# Patient Record
Sex: Male | Born: 1940
Health system: Southern US, Community
[De-identification: ages and names within clinical notes are randomized; demographics above are authoritative.]

## PROBLEM LIST (undated history)

## (undated) DIAGNOSIS — I10 Essential (primary) hypertension: Secondary | ICD-10-CM

## (undated) DIAGNOSIS — I619 Nontraumatic intracerebral hemorrhage, unspecified: Secondary | ICD-10-CM

## (undated) DIAGNOSIS — M549 Dorsalgia, unspecified: Secondary | ICD-10-CM

## (undated) DIAGNOSIS — I639 Cerebral infarction, unspecified: Secondary | ICD-10-CM

## (undated) DIAGNOSIS — S069XAA Unspecified intracranial injury with loss of consciousness status unknown, initial encounter: Secondary | ICD-10-CM

## (undated) DIAGNOSIS — G629 Polyneuropathy, unspecified: Secondary | ICD-10-CM

## (undated) DIAGNOSIS — I2699 Other pulmonary embolism without acute cor pulmonale: Secondary | ICD-10-CM

## (undated) DIAGNOSIS — F419 Anxiety disorder, unspecified: Secondary | ICD-10-CM

## (undated) DIAGNOSIS — G4730 Sleep apnea, unspecified: Secondary | ICD-10-CM

## (undated) DIAGNOSIS — F329 Major depressive disorder, single episode, unspecified: Secondary | ICD-10-CM

## (undated) DIAGNOSIS — F32A Depression, unspecified: Secondary | ICD-10-CM

## (undated) DIAGNOSIS — S069X9A Unspecified intracranial injury with loss of consciousness of unspecified duration, initial encounter: Secondary | ICD-10-CM

## (undated) DIAGNOSIS — E7800 Pure hypercholesterolemia, unspecified: Secondary | ICD-10-CM

## (undated) HISTORY — PX: LEG SURGERY: SHX1003

## (undated) HISTORY — PX: HERNIA REPAIR: SHX51

## (undated) HISTORY — DX: Depression, unspecified: F32.A

## (undated) HISTORY — PX: TOTAL HIP ARTHROPLASTY: SHX124

## (undated) HISTORY — DX: Nontraumatic intracerebral hemorrhage, unspecified: I61.9

## (undated) HISTORY — DX: Anxiety disorder, unspecified: F41.9

## (undated) HISTORY — DX: Dorsalgia, unspecified: M54.9

## (undated) HISTORY — PX: JOINT REPLACEMENT: SHX530

## (undated) HISTORY — DX: Polyneuropathy, unspecified: G62.9

## (undated) HISTORY — DX: Major depressive disorder, single episode, unspecified: F32.9

---

## 1998-09-17 ENCOUNTER — Encounter: Payer: Self-pay | Admitting: Emergency Medicine

## 1998-09-17 ENCOUNTER — Emergency Department (HOSPITAL_COMMUNITY): Admission: EM | Admit: 1998-09-17 | Discharge: 1998-09-17 | Payer: Self-pay | Admitting: Emergency Medicine

## 1998-09-19 ENCOUNTER — Encounter: Payer: Self-pay | Admitting: Emergency Medicine

## 1998-09-19 ENCOUNTER — Emergency Department (HOSPITAL_COMMUNITY): Admission: EM | Admit: 1998-09-19 | Discharge: 1998-09-19 | Payer: Self-pay | Admitting: Emergency Medicine

## 1998-09-21 ENCOUNTER — Encounter: Payer: Self-pay | Admitting: General Surgery

## 1998-09-21 ENCOUNTER — Emergency Department (HOSPITAL_COMMUNITY): Admission: EM | Admit: 1998-09-21 | Discharge: 1998-09-21 | Payer: Self-pay

## 1998-10-29 ENCOUNTER — Ambulatory Visit (HOSPITAL_COMMUNITY): Admission: RE | Admit: 1998-10-29 | Discharge: 1998-10-29 | Payer: Self-pay | Admitting: *Deleted

## 1998-10-29 ENCOUNTER — Encounter (INDEPENDENT_AMBULATORY_CARE_PROVIDER_SITE_OTHER): Payer: Self-pay | Admitting: Specialist

## 1998-11-09 ENCOUNTER — Encounter: Payer: Self-pay | Admitting: Internal Medicine

## 1998-11-09 ENCOUNTER — Inpatient Hospital Stay (HOSPITAL_COMMUNITY): Admission: EM | Admit: 1998-11-09 | Discharge: 1998-11-13 | Payer: Self-pay | Admitting: Emergency Medicine

## 1998-11-09 ENCOUNTER — Encounter: Payer: Self-pay | Admitting: Emergency Medicine

## 1998-11-09 ENCOUNTER — Encounter (HOSPITAL_BASED_OUTPATIENT_CLINIC_OR_DEPARTMENT_OTHER): Payer: Self-pay | Admitting: Internal Medicine

## 1998-11-15 ENCOUNTER — Encounter: Admission: RE | Admit: 1998-11-15 | Discharge: 1999-02-13 | Payer: Self-pay | Admitting: Internal Medicine

## 2000-04-17 ENCOUNTER — Encounter: Payer: Self-pay | Admitting: General Surgery

## 2000-04-20 ENCOUNTER — Ambulatory Visit (HOSPITAL_COMMUNITY): Admission: RE | Admit: 2000-04-20 | Discharge: 2000-04-20 | Payer: Self-pay | Admitting: General Surgery

## 2000-05-01 ENCOUNTER — Ambulatory Visit (HOSPITAL_COMMUNITY): Admission: RE | Admit: 2000-05-01 | Discharge: 2000-05-01 | Payer: Self-pay | Admitting: *Deleted

## 2000-05-01 ENCOUNTER — Encounter (INDEPENDENT_AMBULATORY_CARE_PROVIDER_SITE_OTHER): Payer: Self-pay

## 2001-12-14 ENCOUNTER — Encounter: Payer: Self-pay | Admitting: Emergency Medicine

## 2001-12-14 ENCOUNTER — Inpatient Hospital Stay (HOSPITAL_COMMUNITY): Admission: AC | Admit: 2001-12-14 | Discharge: 2002-01-06 | Payer: Self-pay

## 2001-12-15 ENCOUNTER — Encounter: Payer: Self-pay | Admitting: General Surgery

## 2001-12-16 ENCOUNTER — Encounter: Payer: Self-pay | Admitting: General Surgery

## 2001-12-17 ENCOUNTER — Encounter: Payer: Self-pay | Admitting: Critical Care Medicine

## 2001-12-17 ENCOUNTER — Encounter: Payer: Self-pay | Admitting: Pulmonary Disease

## 2001-12-17 ENCOUNTER — Encounter: Payer: Self-pay | Admitting: General Surgery

## 2001-12-18 ENCOUNTER — Encounter: Payer: Self-pay | Admitting: General Surgery

## 2001-12-19 ENCOUNTER — Encounter: Payer: Self-pay | Admitting: General Surgery

## 2001-12-19 ENCOUNTER — Encounter: Payer: Self-pay | Admitting: Neurosurgery

## 2001-12-20 ENCOUNTER — Encounter: Payer: Self-pay | Admitting: General Surgery

## 2001-12-20 ENCOUNTER — Encounter: Payer: Self-pay | Admitting: Internal Medicine

## 2001-12-21 ENCOUNTER — Encounter: Payer: Self-pay | Admitting: General Surgery

## 2001-12-22 ENCOUNTER — Encounter: Payer: Self-pay | Admitting: General Surgery

## 2001-12-23 ENCOUNTER — Encounter: Payer: Self-pay | Admitting: General Surgery

## 2001-12-23 ENCOUNTER — Encounter: Payer: Self-pay | Admitting: Neurosurgery

## 2001-12-24 ENCOUNTER — Encounter: Payer: Self-pay | Admitting: Critical Care Medicine

## 2001-12-24 ENCOUNTER — Encounter: Payer: Self-pay | Admitting: Surgery

## 2001-12-24 ENCOUNTER — Encounter: Payer: Self-pay | Admitting: General Surgery

## 2001-12-25 ENCOUNTER — Encounter: Payer: Self-pay | Admitting: Critical Care Medicine

## 2001-12-28 ENCOUNTER — Encounter: Payer: Self-pay | Admitting: General Surgery

## 2001-12-29 ENCOUNTER — Encounter: Payer: Self-pay | Admitting: General Surgery

## 2001-12-30 ENCOUNTER — Encounter: Payer: Self-pay | Admitting: General Surgery

## 2001-12-31 ENCOUNTER — Encounter: Payer: Self-pay | Admitting: General Surgery

## 2002-01-01 ENCOUNTER — Encounter: Payer: Self-pay | Admitting: General Surgery

## 2002-01-02 ENCOUNTER — Encounter: Payer: Self-pay | Admitting: General Surgery

## 2002-01-05 ENCOUNTER — Encounter: Payer: Self-pay | Admitting: General Surgery

## 2002-01-06 ENCOUNTER — Inpatient Hospital Stay (HOSPITAL_COMMUNITY)
Admission: RE | Admit: 2002-01-06 | Discharge: 2002-01-27 | Payer: Self-pay | Admitting: Physical Medicine & Rehabilitation

## 2002-01-14 ENCOUNTER — Encounter: Payer: Self-pay | Admitting: Physical Medicine & Rehabilitation

## 2002-02-01 ENCOUNTER — Encounter
Admission: RE | Admit: 2002-02-01 | Discharge: 2002-03-08 | Payer: Self-pay | Admitting: Physical Medicine & Rehabilitation

## 2003-04-13 ENCOUNTER — Encounter: Admission: RE | Admit: 2003-04-13 | Discharge: 2003-04-13 | Payer: Self-pay | Admitting: Orthopedic Surgery

## 2003-09-12 ENCOUNTER — Inpatient Hospital Stay (HOSPITAL_COMMUNITY): Admission: RE | Admit: 2003-09-12 | Discharge: 2003-09-30 | Payer: Self-pay | Admitting: *Deleted

## 2003-11-07 ENCOUNTER — Inpatient Hospital Stay (HOSPITAL_COMMUNITY): Admission: EM | Admit: 2003-11-07 | Discharge: 2003-11-14 | Payer: Self-pay | Admitting: Emergency Medicine

## 2003-11-14 ENCOUNTER — Inpatient Hospital Stay
Admission: RE | Admit: 2003-11-14 | Discharge: 2003-11-28 | Payer: Self-pay | Admitting: Physical Medicine & Rehabilitation

## 2003-11-14 ENCOUNTER — Ambulatory Visit: Payer: Self-pay | Admitting: Physical Medicine & Rehabilitation

## 2003-11-17 ENCOUNTER — Encounter (HOSPITAL_COMMUNITY): Admission: RE | Admit: 2003-11-17 | Discharge: 2003-11-18 | Payer: Self-pay | Admitting: Emergency Medicine

## 2004-03-19 ENCOUNTER — Inpatient Hospital Stay (HOSPITAL_COMMUNITY): Admission: RE | Admit: 2004-03-19 | Discharge: 2004-03-30 | Payer: Self-pay | Admitting: Orthopedic Surgery

## 2004-03-29 ENCOUNTER — Ambulatory Visit: Payer: Self-pay | Admitting: Physical Medicine & Rehabilitation

## 2004-03-30 ENCOUNTER — Encounter (HOSPITAL_COMMUNITY)
Admission: RE | Admit: 2004-03-30 | Discharge: 2004-04-10 | Payer: Self-pay | Admitting: Physical Medicine & Rehabilitation

## 2004-03-30 ENCOUNTER — Inpatient Hospital Stay
Admission: RE | Admit: 2004-03-30 | Discharge: 2004-04-10 | Payer: Self-pay | Admitting: Physical Medicine & Rehabilitation

## 2005-03-15 IMAGING — CR DG ABDOMEN 1V
1 series · 1 of 1 positions shown · non-contrast
Comparison: 09/25/03

CLINICAL DATA: Abdominal distention. 
 PORTABLE ABDOMEN ? 09/28/03 ([DATE] HOURS)

[view not recorded]
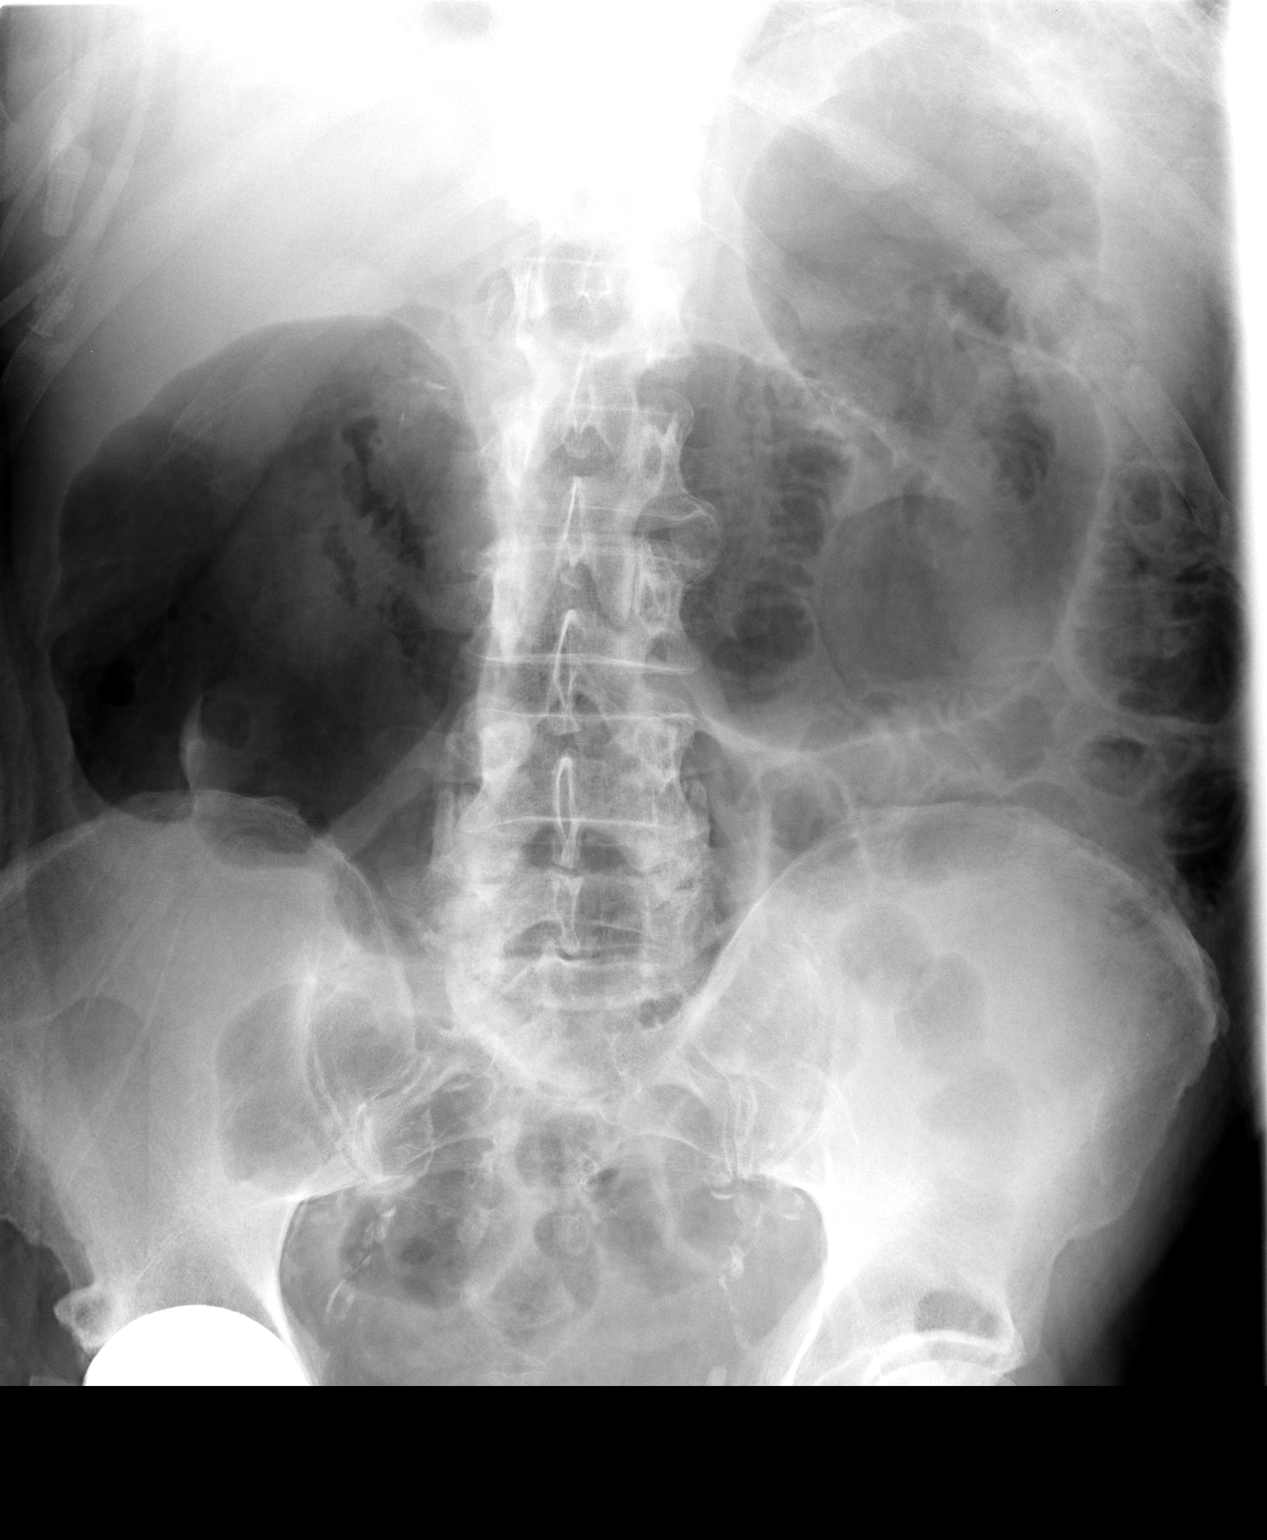

[1 of 1 positions shown; findings below may reference images not displayed]

FINDINGS: Colonic distention has improved.  There is persistent distention at the hepatic flexure.  Small bowel distention is now better visualized.  Degenerative changes in the spine are again noted.  Vascular calcifications are seen.  The soft tissues are within normal limits.  
 IMPRESSION 
 Improving colonic ileus.

## 2005-03-16 IMAGING — CR DG ABDOMEN 1V
1 series · 1 of 1 positions shown · non-contrast
Comparison: 09/28/03 at [DATE] hours.

CLINICAL DATA: Right hip osteoarthritis. 
 FLAT ABDOMEN

[view not recorded]
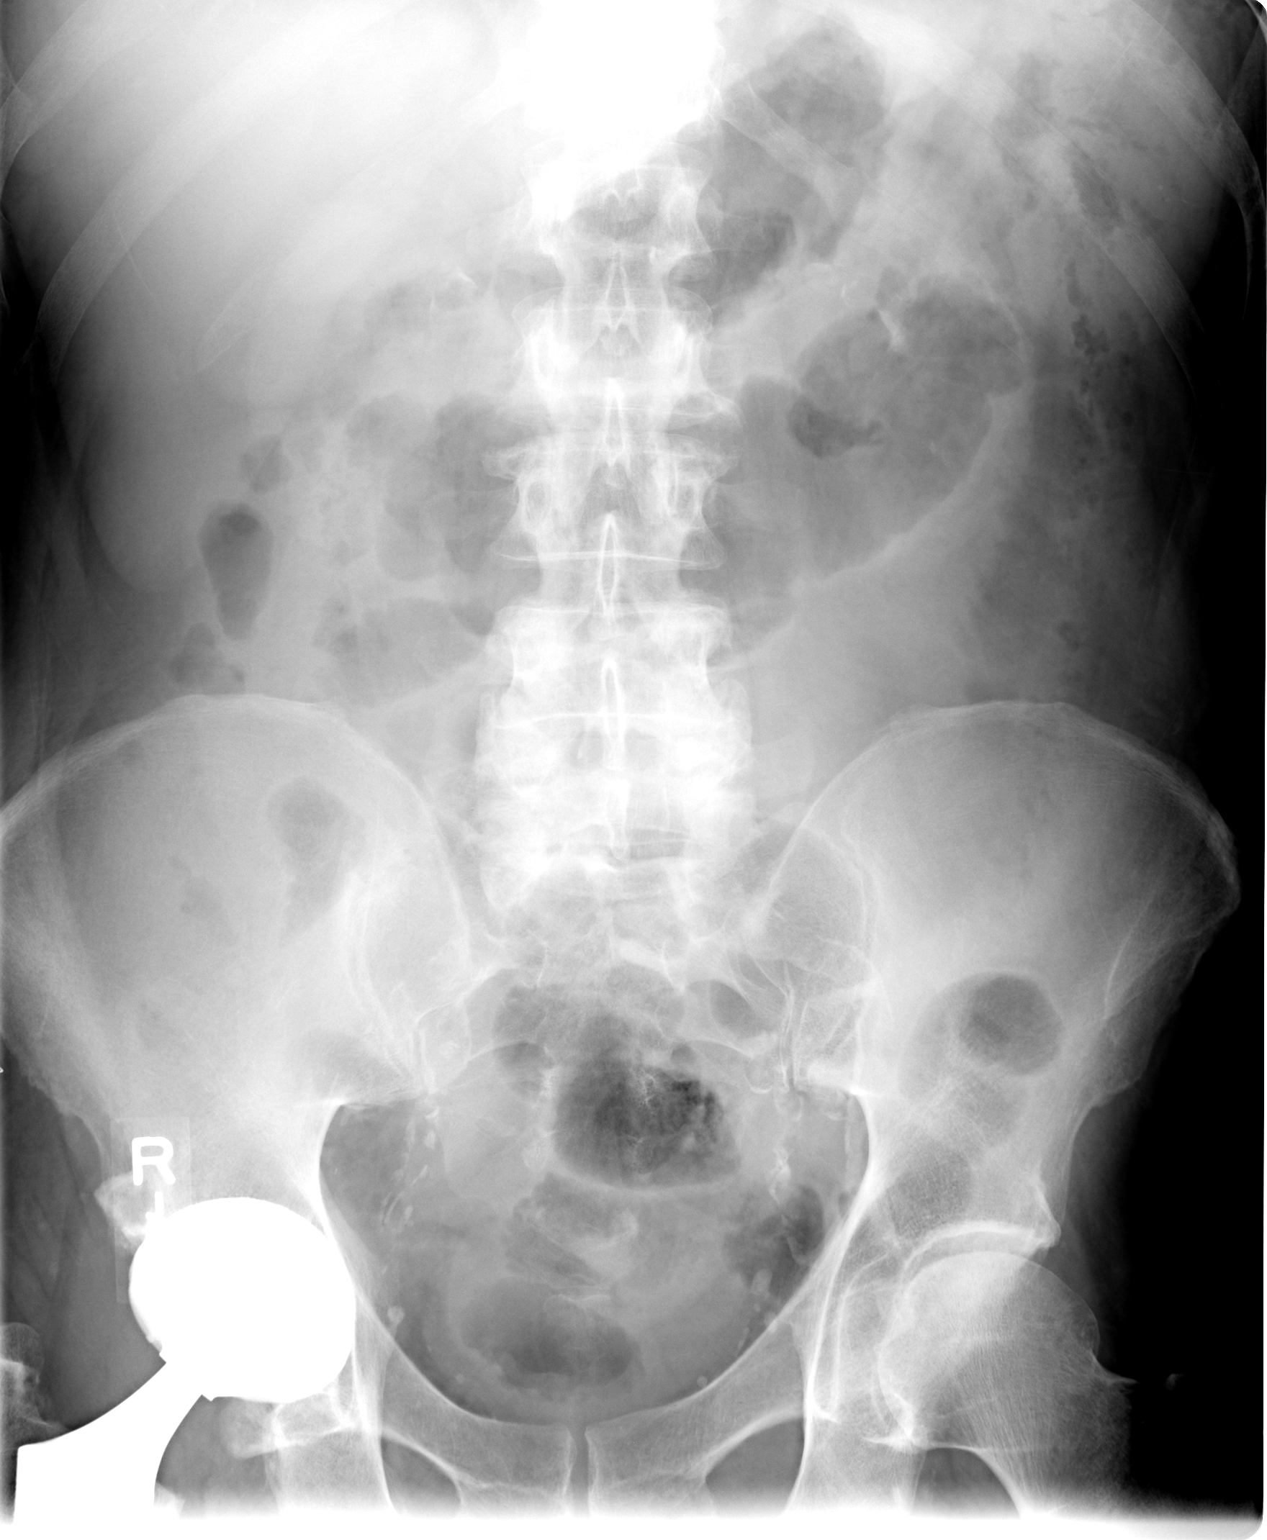

[1 of 1 positions shown; findings below may reference images not displayed]

FINDINGS: There has been further improvement in colonic distention.  Now a nondilated colon and mildly prominent loops of small bowel are present.  Gas is seen in the rectosigmoid. 
 IMPRESSION
 Nearly resolved ileus.

## 2007-01-30 ENCOUNTER — Emergency Department (HOSPITAL_COMMUNITY): Admission: EM | Admit: 2007-01-30 | Discharge: 2007-01-30 | Payer: Self-pay | Admitting: Emergency Medicine

## 2007-11-12 ENCOUNTER — Ambulatory Visit (HOSPITAL_COMMUNITY): Admission: RE | Admit: 2007-11-12 | Discharge: 2007-11-12 | Payer: Self-pay | Admitting: *Deleted

## 2007-11-12 ENCOUNTER — Encounter (INDEPENDENT_AMBULATORY_CARE_PROVIDER_SITE_OTHER): Payer: Self-pay | Admitting: *Deleted

## 2008-05-26 ENCOUNTER — Encounter: Admission: RE | Admit: 2008-05-26 | Discharge: 2008-05-26 | Payer: Self-pay | Admitting: Internal Medicine

## 2008-07-08 ENCOUNTER — Encounter: Admission: RE | Admit: 2008-07-08 | Discharge: 2008-07-08 | Payer: Self-pay | Admitting: Neurosurgery

## 2008-10-13 ENCOUNTER — Encounter: Admission: RE | Admit: 2008-10-13 | Discharge: 2008-10-13 | Payer: Self-pay | Admitting: Neurosurgery

## 2009-07-14 ENCOUNTER — Ambulatory Visit: Payer: Self-pay | Admitting: Pulmonary Disease

## 2009-07-14 ENCOUNTER — Inpatient Hospital Stay (HOSPITAL_COMMUNITY): Admission: EM | Admit: 2009-07-14 | Discharge: 2009-07-20 | Payer: Self-pay | Admitting: Emergency Medicine

## 2009-12-25 ENCOUNTER — Encounter: Admission: RE | Admit: 2009-12-25 | Discharge: 2009-12-25 | Payer: Self-pay | Admitting: Internal Medicine

## 2010-06-11 LAB — DIFFERENTIAL
Basophils Absolute: 0 10*3/uL (ref 0.0–0.1)
Basophils Relative: 0 % (ref 0–1)
Eosinophils Absolute: 0 10*3/uL (ref 0.0–0.7)
Eosinophils Relative: 0 % (ref 0–5)
Lymphocytes Relative: 10 % — ABNORMAL LOW (ref 12–46)
Lymphocytes Relative: 12 % (ref 12–46)
Monocytes Absolute: 0.9 10*3/uL (ref 0.1–1.0)
Neutro Abs: 7.9 10*3/uL — ABNORMAL HIGH (ref 1.7–7.7)

## 2010-06-11 LAB — GLUCOSE, CAPILLARY
Glucose-Capillary: 101 mg/dL — ABNORMAL HIGH (ref 70–99)
Glucose-Capillary: 107 mg/dL — ABNORMAL HIGH (ref 70–99)
Glucose-Capillary: 110 mg/dL — ABNORMAL HIGH (ref 70–99)
Glucose-Capillary: 114 mg/dL — ABNORMAL HIGH (ref 70–99)
Glucose-Capillary: 116 mg/dL — ABNORMAL HIGH (ref 70–99)
Glucose-Capillary: 120 mg/dL — ABNORMAL HIGH (ref 70–99)
Glucose-Capillary: 125 mg/dL — ABNORMAL HIGH (ref 70–99)
Glucose-Capillary: 141 mg/dL — ABNORMAL HIGH (ref 70–99)
Glucose-Capillary: 147 mg/dL — ABNORMAL HIGH (ref 70–99)
Glucose-Capillary: 152 mg/dL — ABNORMAL HIGH (ref 70–99)
Glucose-Capillary: 155 mg/dL — ABNORMAL HIGH (ref 70–99)
Glucose-Capillary: 75 mg/dL (ref 70–99)
Glucose-Capillary: 75 mg/dL (ref 70–99)
Glucose-Capillary: 81 mg/dL (ref 70–99)
Glucose-Capillary: 89 mg/dL (ref 70–99)
Glucose-Capillary: 90 mg/dL (ref 70–99)
Glucose-Capillary: 95 mg/dL (ref 70–99)
Glucose-Capillary: 96 mg/dL (ref 70–99)
Glucose-Capillary: 97 mg/dL (ref 70–99)

## 2010-06-11 LAB — URINALYSIS, ROUTINE W REFLEX MICROSCOPIC
Bilirubin Urine: NEGATIVE
Bilirubin Urine: NEGATIVE
Hgb urine dipstick: NEGATIVE
Ketones, ur: NEGATIVE mg/dL
Ketones, ur: NEGATIVE mg/dL
Nitrite: NEGATIVE
Nitrite: NEGATIVE
Protein, ur: NEGATIVE mg/dL
Protein, ur: NEGATIVE mg/dL
Specific Gravity, Urine: 1.019 (ref 1.005–1.030)
Urobilinogen, UA: 0.2 mg/dL (ref 0.0–1.0)
Urobilinogen, UA: 0.2 mg/dL (ref 0.0–1.0)
pH: 5 (ref 5.0–8.0)
pH: 5 (ref 5.0–8.0)

## 2010-06-11 LAB — CBC
HCT: 32.3 % — ABNORMAL LOW (ref 39.0–52.0)
HCT: 32.6 % — ABNORMAL LOW (ref 39.0–52.0)
HCT: 32.7 % — ABNORMAL LOW (ref 39.0–52.0)
HCT: 33.4 % — ABNORMAL LOW (ref 39.0–52.0)
Hemoglobin: 11.2 g/dL — ABNORMAL LOW (ref 13.0–17.0)
Hemoglobin: 11.8 g/dL — ABNORMAL LOW (ref 13.0–17.0)
MCHC: 34.8 g/dL (ref 30.0–36.0)
MCHC: 35.3 g/dL (ref 30.0–36.0)
MCHC: 35.4 g/dL (ref 30.0–36.0)
MCHC: 35.4 g/dL (ref 30.0–36.0)
MCV: 93.3 fL (ref 78.0–100.0)
MCV: 93.6 fL (ref 78.0–100.0)
MCV: 94 fL (ref 78.0–100.0)
Platelets: 154 10*3/uL (ref 150–400)
Platelets: 180 10*3/uL (ref 150–400)
Platelets: 210 10*3/uL (ref 150–400)
RBC: 3.43 MIL/uL — ABNORMAL LOW (ref 4.22–5.81)
RBC: 3.47 MIL/uL — ABNORMAL LOW (ref 4.22–5.81)
RBC: 3.51 MIL/uL — ABNORMAL LOW (ref 4.22–5.81)
RDW: 13.8 % (ref 11.5–15.5)
RDW: 13.8 % (ref 11.5–15.5)
WBC: 10 10*3/uL (ref 4.0–10.5)
WBC: 7.2 10*3/uL (ref 4.0–10.5)
WBC: 8 10*3/uL (ref 4.0–10.5)

## 2010-06-11 LAB — CARBOXYHEMOGLOBIN
Carboxyhemoglobin: 1.1 % (ref 0.5–1.5)
Methemoglobin: 1 % (ref 0.0–1.5)
O2 Saturation: 70.3 %
Total hemoglobin: 11.5 g/dL — ABNORMAL LOW (ref 13.5–18.0)

## 2010-06-11 LAB — LEGIONELLA ANTIGEN, URINE

## 2010-06-11 LAB — BASIC METABOLIC PANEL
BUN: 13 mg/dL (ref 6–23)
BUN: 14 mg/dL (ref 6–23)
BUN: 14 mg/dL (ref 6–23)
BUN: 20 mg/dL (ref 6–23)
CO2: 16 mEq/L — ABNORMAL LOW (ref 19–32)
CO2: 19 mEq/L (ref 19–32)
CO2: 24 mEq/L (ref 19–32)
Calcium: 8.4 mg/dL (ref 8.4–10.5)
Chloride: 107 mEq/L (ref 96–112)
Chloride: 109 mEq/L (ref 96–112)
Chloride: 116 mEq/L — ABNORMAL HIGH (ref 96–112)
Creatinine, Ser: 1.29 mg/dL (ref 0.4–1.5)
Creatinine, Ser: 1.39 mg/dL (ref 0.4–1.5)
Creatinine, Ser: 2.08 mg/dL — ABNORMAL HIGH (ref 0.4–1.5)
GFR calc Af Amer: 39 mL/min — ABNORMAL LOW (ref >60)
GFR calc Af Amer: >60 mL/min (ref >60)
GFR calc non Af Amer: 40 mL/min — ABNORMAL LOW (ref >60)
Glucose, Bld: 89 mg/dL (ref 70–99)
Glucose, Bld: 95 mg/dL (ref 70–99)
Potassium: 3.2 mEq/L — ABNORMAL LOW (ref 3.5–5.1)
Potassium: 3.3 mEq/L — ABNORMAL LOW (ref 3.5–5.1)
Potassium: 3.7 mEq/L (ref 3.5–5.1)
Sodium: 137 mEq/L (ref 135–145)

## 2010-06-11 LAB — POCT I-STAT 3, ART BLOOD GAS (G3+)
Acid-base deficit: 8 mmol/L — ABNORMAL HIGH (ref 0.0–2.0)
Bicarbonate: 18.3 mEq/L — ABNORMAL LOW (ref 20.0–24.0)
O2 Saturation: 90 %
TCO2: 19 mmol/L (ref 0–100)
pCO2 arterial: 32.5 mmHg — ABNORMAL LOW (ref 35.0–45.0)
pO2, Arterial: 64 mmHg — ABNORMAL LOW (ref 80.0–100.0)
pO2, Arterial: 64 mmHg — ABNORMAL LOW (ref 80.0–100.0)

## 2010-06-11 LAB — CULTURE, RESPIRATORY W GRAM STAIN

## 2010-06-11 LAB — PROTIME-INR
INR: 1.93 — ABNORMAL HIGH (ref 0.00–1.49)
INR: 2.73 — ABNORMAL HIGH (ref 0.00–1.49)
INR: 3.34 — ABNORMAL HIGH (ref 0.00–1.49)
INR: 3.34 — ABNORMAL HIGH (ref 0.00–1.49)
Prothrombin Time: 20.8 seconds — ABNORMAL HIGH (ref 11.6–15.2)
Prothrombin Time: 21.9 seconds — ABNORMAL HIGH (ref 11.6–15.2)
Prothrombin Time: 28.7 seconds — ABNORMAL HIGH (ref 11.6–15.2)

## 2010-06-11 LAB — URINE CULTURE
Colony Count: NO GROWTH
Culture: NO GROWTH

## 2010-06-11 LAB — MAGNESIUM
Magnesium: 1.7 mg/dL (ref 1.5–2.5)
Magnesium: 2.1 mg/dL (ref 1.5–2.5)

## 2010-06-11 LAB — COMPREHENSIVE METABOLIC PANEL
AST: 42 U/L — ABNORMAL HIGH (ref 0–37)
Albumin: 3.3 g/dL — ABNORMAL LOW (ref 3.5–5.2)
BUN: 52 mg/dL — ABNORMAL HIGH (ref 6–23)
CO2: 17 mEq/L — ABNORMAL LOW (ref 19–32)
Calcium: 7.9 mg/dL — ABNORMAL LOW (ref 8.4–10.5)
Chloride: 103 mEq/L (ref 96–112)
Chloride: 108 mEq/L (ref 96–112)
Creatinine, Ser: 3.14 mg/dL — ABNORMAL HIGH (ref 0.4–1.5)
Creatinine, Ser: 3.58 mg/dL — ABNORMAL HIGH (ref 0.4–1.5)
GFR calc Af Amer: 24 mL/min — ABNORMAL LOW (ref >60)
GFR calc non Af Amer: 17 mL/min — ABNORMAL LOW (ref >60)
GFR calc non Af Amer: 20 mL/min — ABNORMAL LOW (ref >60)
Total Bilirubin: 1.4 mg/dL — ABNORMAL HIGH (ref 0.3–1.2)
Total Bilirubin: 1.9 mg/dL — ABNORMAL HIGH (ref 0.3–1.2)

## 2010-06-11 LAB — LIPASE, BLOOD: Lipase: 21 U/L (ref 11–59)

## 2010-06-11 LAB — LACTIC ACID, PLASMA: Lactic Acid, Venous: 1.3 mmol/L (ref 0.5–2.2)

## 2010-06-11 LAB — CARDIAC PANEL(CRET KIN+CKTOT+MB+TROPI)
CK, MB: 14.1 ng/mL (ref 0.3–4.0)
CK, MB: 15.6 ng/mL (ref 0.3–4.0)
Relative Index: 1.4 (ref 0.0–2.5)
Total CK: 1096 U/L — ABNORMAL HIGH (ref 7–232)
Total CK: 797 U/L — ABNORMAL HIGH (ref 7–232)
Troponin I: 0.03 ng/mL (ref 0.00–0.06)

## 2010-06-11 LAB — CLOSTRIDIUM DIFFICILE EIA
C difficile Toxins A+B, EIA: NEGATIVE
C difficile Toxins A+B, EIA: NEGATIVE

## 2010-06-11 LAB — CULTURE, BLOOD (ROUTINE X 2): Culture: NO GROWTH

## 2010-06-11 LAB — APTT: aPTT: 41 seconds — ABNORMAL HIGH (ref 24–37)

## 2010-06-11 LAB — PHOSPHORUS: Phosphorus: 2 mg/dL — ABNORMAL LOW (ref 2.3–4.6)

## 2010-06-11 LAB — SEDIMENTATION RATE: Sed Rate: 40 mm/hr — ABNORMAL HIGH (ref 0–16)

## 2010-06-11 LAB — URINE MICROSCOPIC-ADD ON

## 2010-06-11 LAB — STREP PNEUMONIAE URINARY ANTIGEN: Strep Pneumo Urinary Antigen: NEGATIVE

## 2010-06-11 LAB — CORTISOL: Cortisol, Plasma: 5.4 ug/dL

## 2010-06-11 LAB — TROPONIN I: Troponin I: 0.03 ng/mL (ref 0.00–0.06)

## 2010-08-06 NOTE — Op Note (Signed)
NAME:  Steven Guerrero, Steven Guerrero                  ACCOUNT NO.:  [id number]   MEDICAL RECORD NO.:  [id number]          PATIENT TYPE:  AMB   LOCATION:  ENDO                         FACILITY:  [facility]   PHYSICIAN:  Georgiana Spinner, M.D.    DATE OF BIRTH:  Feb 18, 1941   DATE OF PROCEDURE:  11/12/2007  DATE OF DISCHARGE:                               OPERATIVE REPORT   PROCEDURE:  Upper endoscopy with biopsy.   INDICATIONS:  GERD.   ANESTHESIA:  1. Fentanyl 75 mcg.  2. Versed 7.5 mg.   PROCEDURE:  With patient mildly sedated in the left lateral decubitus  position, the Pentax videoscopic endoscope was inserted in the mouth,  passed under direct vision through the esophagus which appeared normal  and with close inspection I did not see a clear area of Barrett's  esophagus.  This was photographed in multiple views as best we could.  We then entered into the stomach, fundus, body, antrum, appeared normal.  Duodenal bulb showed changes of duodenitis which was photographed and  biopsied, the second portion of duodenum appeared normal.  From this  point, the endoscope was slowly withdrawn, taking circumferential views  of duodenal mucosa until the endoscope had been pulled back and the  stomach placed in retroflexion to view the stomach from below.  The  endoscope was straightened and withdrawn, taking circumferential views  of remaining gastric and esophageal mucosa.  The patient's vital signs,  pulse oximeter remained stable.  The patient tolerated procedure well  without apparent complications.   FINDINGS:  Duodenitis, otherwise an unremarkable exam.   PLAN:  Await biopsy report.  The patient will call me for results and  follow up with me as an outpatient.           ______________________________  Georgiana Spinner, M.D.     GMO/MEDQ  D:  11/12/2007  T:  11/12/2007  Job:  [id number]

## 2010-08-09 NOTE — Discharge Summary (Signed)
NAMETADHG, ESKEW                  ACCOUNT NO.:  [id number]   MEDICAL RECORD NO.:  [id number]          PATIENT TYPE:  ORB   LOCATION:  4533                         FACILITY:  [facility]   PHYSICIAN:  Ranelle Oyster, M.D.DATE OF BIRTH:  [birth year]   DATE OF ADMISSION:  03/30/2004  DATE OF DISCHARGE:  04/10/2004                                 DISCHARGE SUMMARY   DISCHARGE DIAGNOSES:  1.  Right femur periprosthetic fracture with open reduction and internal      fixation and revision, March 21, 2004.  2.  History of pulmonary embolus.  3.  Ileus, resolved.  4.  Depression with anxiety.  5.  Right shin ulcers.   HISTORY OF PRESENT ILLNESS:  Mr. Porzio is a 70 year old male with history of  right total hip replacement in June of 2005, right periprosthetic hip  fracture August of 2005, admitted March 19, 2004 with right thigh pain  secondary to right femur fracture through plate.  He was stated on IV  heparin.  His INR was subtherapeutic at 1.4.  Bilateral lower extremity  Dopplers done showed no DVTs.  On March 21, 2004, the patient underwent  removal of right femur hardware and right long stem revision with ORIF with  cable fixation of right femur periprosthetic fracture by Dr. August Saucer.  Postoperatively, he is nonweightbearing, and Coumadin was resumed.  The  patient did have issues with ileus post-surgery with placement of NG tube  and a rectal tube as Reglan was ineffective.  He did have some improvement  in his symptomatology in 24 hours, and this was discontinued as the patient  had abdominal decompression.  Diet is initiated, and the patient is able to  tolerate this without difficulty.  Therapy is ongoing, and the patient is  noted to be at maximum assist for transfers, moderate assist to ambulate 15  feet x2 with a rolling walker, requiring cues for safety.  SACU was  consulted for progressive independence.   PAST MEDICAL HISTORY:  Significant for CVA and an old  subarachnoid  hemorrhage in 2003, hypertension, depression, history of PE in June of 2005,  right total hip replacement in June of 2005, right lower extremity ulcers,  GI bleed, B12 deficiency, chronic dysarthria, and left inguinal hernia  repair.   ALLERGIES:  No known drug allergies.   SOCIAL HISTORY:  The patient is married, lives in a one-level home with five  steps to entry, uses alcohol occasionally, smoke one pack per day.   HOSPITAL COURSE:  Mr.  Stryder Poitra was admitted subacute on March 30, 2004,  for SACU level therapies to consist of PT and OT daily.  On the past  admission, he was maintained on Coumadin for DVT prophylaxis and secondary  to his history of PE.  He was tapered off Reglan and currently has been able  to tolerate a diet without difficulty.  No signs of recurrent abdominal  distention or constipation.  The patient's hip incision was noted to be  healing well without any signs or symptoms of infection or drainage or  erythema noted.  Staples were discontinued and Steri-Stripped on April 08, 2004 without difficulty.  He was noted to have right shin ulcers at  admission. This was noted to have some worsening despite use of a padded  dressing for pressure relief on the right shin.  The patient reports  improvement with Unna boot in the past.  This was resumed.  This was changed  out on April 11, 2003, and as the patient with decrease in some eschar;  however, continued shin ulcers.  Unna boots were replaced, and the patient  to follow up with Dr. Lajoyce Corners in two weeks for recheck sites and change of Unna  boot at that time.   FOLLOW-UP LABORATORIES PAST ADMISSION:  Postoperative anemia was stable with  hemoglobin of 9.4, hematocrit 27.9.  The patient was noted to have abnormal  LFTs.  On check, he had an AST of 58, ALT 95, alkaline phosphatase 28.  The  patient to follow up with LMD for recheck LFTs in the future.  The patient's  Coumadin is therapeutic at  discharge with INR at 2.4.  The patient  discharged at 10 mg a day of Coumadin daily except on Thursdays.  Next pro  time to be checked on April 12, 2004, with results to Mercy Riding, M.D. at  [facility].   During his stay in subacute, the patient progressed along well.  Currently,  he is at supervision to minimal assist for transfers, supervision with  ambulation with a rolling walker, minimal assist at navigating ramp,  supervision and set up for ADLs.  Further follow up with home health  therapies were indicated;  however, the patient has refused this secondary  to cost concerns.  On April 10, 2004, the patient is discharged to home.   DISCHARGE MEDICATIONS:  1.  Coumadin 10 mg per day except Thursday.  2  Nu-Iron 150 mg b.i.d.  1.  Celexa 40 mg a day.  2.  Klonopin 0.5 mg q.h.s.  3.  Remeron 50 mg q.h.s.  4.  __________ 10 mg a day.  5.  Pepcid 20 mg a day.  6.  Vitamin C 500 mg b.i.d.  7.  Zinc sulfate 220 mg b.i.d.  8.  Oxycodone IR 5-10 mg q.4-6 h. p.r.n. pain.   ACTIVITY:  No weight on right lower extremity, use walker.   DIET:  Regular.   SPECIAL INSTRUCTIONS:  No alcohol, no smoking.  Home health R.N. to draw pro  time next on April 12, 2004, with the results to Dr. Jason Fila.   FOLLOW UP:  The patient to follow up with Dr. Lajoyce Corners for Roland Rack boot change in 2-  3 weeks, follow up with Dr. August Saucer for postoperative check, follow up with Dr.  [name] for routine check and for check of LFTs, follow up with Dr. Riley Kill as  needed.      Pame   PP/MEDQ  D:  04/10/2004  T:  04/10/2004  Job:  [id number]   cc:   G. Dorene Grebe, M.D.  Fax: [phone]   Laural Benes, M.D.   Soyla Murphy. [name], M.D.  [address]  [place]  [place] [address]  Fax: [phone]

## 2010-08-09 NOTE — Discharge Summary (Signed)
NAMEIDRIS, Guerrero                  ACCOUNT NO.:  [id number]   MEDICAL RECORD NO.:  [id number]          PATIENT TYPE:  ORB   LOCATION:  4533                         FACILITY:  [facility]   PHYSICIAN:  Ranelle Oyster, M.D.DATE OF BIRTH:  Feb 28, 1941   DATE OF ADMISSION:  03/30/2004  DATE OF DISCHARGE:  04/10/2004                                 DISCHARGE SUMMARY   DISCHARGE DIAGNOSES:  1.  Right femur para-prosthetic fracture with revision.  2.  History of pulmonary emboli chronically.  3.  History of depression.  4.  Right shin pressure areas.   HISTORY OF PRESENT ILLNESS:  [name] is a 70 year old male with a history  of right total hip replacement in June, 2005 with subsequent para-prosthetic  hip fracture in August, 2005.  History of PE last summer.  Admitted on  December 27 with right thigh pain and right femur fracture through plate.  He was started on IV heparin for cross-coverage.  His INR at 1.4.  Bilateral  lower extremity Doppler showed no DVT.  On December 29th, patient underwent  removal of right femur hardware with the right femur long stem revision and  ORIF with cable fixation of right femur para-prosthetic fracture by Dr.  August Saucer.  Postop is nonweightbearing.  Coumadin was resumed.  He did have  postop complications with tachycardia as well as abdominal discomfort  secondary to ileus.  He was placed on clear liquids without any improvement;  therefore, GI was consulted for input.  Patient was treated with rectal tube  and NG tube for decompression.  This was discharged past 24 hours, as  patient with improvement in abdominal symptomatology.  Patient has been  mobilized.  Therapy is ongoing.  Patient currently at modified assist for  transfers and modified assist ambulating 15 feet x2.  Requires cueing for  safety.  SACU was consulted for progressive therapies.   PAST MEDICAL HISTORY:  1.  Significant for CVA in 2001.  2.  Subarachnoid hemorrhage in 2003.  3.   Hypertension.  4.  Depression.  5.  PE in June, 2005.  6.  Right total hip replacement, June, 2005.  7.  Right lower extremity ulcer.  8.  GI bleed.  9.  B12 deficiency.  10. Dysarthria.  11. Left inguinal hernia.   ALLERGIES:  No known drug allergies.   SOCIAL HISTORY:  Patient is married.  Lives in a one-level home with five  steps at entry.  Smokes one pack per day.  Uses alcohol occasionally.   HOSPITAL COURSE:  [name] was admitted to subacute on March 30, 2004  for SACU and therapies to consist of PT/OT daily.  Past admission, patient  was maintained on Coumadin for DVT prophylaxis and secondary to his history  of PE.  Patient was maintained on Reglan initially.  This was slowly tapered  off.  Patient has had improvement in abdominal distention.  Has been able to  tolerate p.o. intake with no complaint of constipation.  The patient was  noted to have right shin ulcer that were noted to be oozing.  Dry dressing  was initially attempted, as this without improvement and patient with black  eschar development on the lower of the two shin ulcers.  Unna boot was  initiated.  The patient to continue Unna boot for two additional weeks past  discharge and follow up with Dr. Lajoyce Corners for wound assessment and further  treatment as needed.   Patient's right hip and thigh incisions have healed well without any signs  or symptoms of infection.  No drainage or erythema noted.  Staples were  discharged.  The area was steri-stripped on postop day 18 without  difficulty.  Patient's mood has been stable, and he has been progressing  along well with therapies.  At the time of discharge, the patient was at  supervision with minimal assist for transfers, supervision for ambulating  100 feet with a rolling walker.  Requires minimal assistance to navigate  ramp.   Further followup home health therapies were recommended; however, the  patient refused this secondary to cost issues.   On  April 10, 2004, patient was discharged to home.   DISCHARGE MEDICATIONS:  1.  Coumadin 10 mg p.o. q.p.m. except Thursdays.  2.  Nu-Iron 150 mg b.i.d.  3.  Celexa 40 mg a day.  4.  Klonopin 0.5 mg q.h.s.  5.  Remeron 150 mg daily.  6.  Prinivil 10 mg daily.  7.  Pepcid 20 mg a day.  8.  Vitamin C 500 mg b.i.d.  9.  Zinc sulfate 220 mg b.i.d.  10. Oxycodone IR 5-10 mg q.4-6h. p.r.n. pain.   ACTIVITY:  No weight to right lower extremity.   DIET:  Regular.   WOUND CARE:  Unna boot on right lower extremity to be changed in two weeks  at Dr. Audrie Lia office.   SPECIAL INSTRUCTIONS:  No alcohol, smoking, or driving.  Home health to draw  pro time next on January 20 with results to Virgina Evener.   FOLLOWUP:  Patient to follow up Dr. August Saucer.   DICTATION ENDED AT THIS POINT...      Pame   PP/MEDQ  D:  04/10/2004  T:  04/10/2004  Job:  [id number]   cc:   G. Dorene Grebe, M.D.  Fax: [phone]   Soyla Murphy. [name], M.D.  [address]  [place]  [place] [address]  Fax: [phone]

## 2010-08-09 NOTE — Consult Note (Signed)
NAME:  Steven Guerrero, Steven Guerrero                            ACCOUNT NO.:  [id number]   MEDICAL RECORD NO.:  [id number]                   PATIENT TYPE:  INP   LOCATION:  3012                                 FACILITY:  [facility]   PHYSICIAN:  Lonia Blood, M.D.                   DATE OF BIRTH:  [birth year]   DATE OF CONSULTATION:  DATE OF DISCHARGE:                                   CONSULTATION   CHIEF COMPLAINT:  Right hip fracture secondary to falls.   REASON FOR CONSULTATION:  Medical management, anticoagulation.   HISTORY OF PRESENT ILLNESS:  This is a 70 year old white male with history  of hypertension, pulmonary embolism and CVA who is on Coumadin therapy,  admitted with right hip fracture.  The patient was working at home when he  lost his balance on the stairs and tumbled down.  The stairs were quite  steep.  The patient had to use his elbows to break his fall.  He did not hit  his head on any concrete or any hard surface.  He was brought to the ED  secondary to right hip pain and swelling after the fall.  An x-ray showed an  oblique fracture on the right side where he had a total hip replacement in  June 2005.  He is scheduled to have surgery possibly tomorrow and we are  consulted for management of his anticoagulation and medical problems.   PAST MEDICAL HISTORY:  1. Right hip arthritis, status post total hip replacement in June 2005.  2. Pulmonary embolism postoperatively in June 2005.  The patient is on     Coumadin.  3. CVA in August 2000 with a residual speech dysarthria as well as     subarachnoid hemorrhage secondary to trauma.  4. History of right mandibular fracture.  5. History of TIA.  6. Depression.  7. History of hiatal hernia repair in 2001.  8. Hypertension off medications.  9. History of alcohol abuse.  10.      History of tobacco abuse.  11.      History of GERD.  12.      History of B12 deficiency on monthly B12 injections.  13.      History of UTIs in the past.  14.       History of postoperative ileus in June 2005.   ALLERGIES:  No known drug allergies.   MEDICATIONS:  1. Nexium 20 mg daily.  2. Clonazepam 0.5 mg q.h.s.  3. Celexa 40 mg q.h.s.  4. Remeron 15 mg q.h.s.   SOCIAL HISTORY:  The patient lives in [place], [place], with his  wife.  He still smokes about a pack per day which he has done for more than  40 years.  He has history of alcohol abuse in the past.  The patient is  currently dysarthric but able to do a few of  his ADLs at home.   FAMILY HISTORY:  This is mainly significant for heart disease.   REVIEW OF SYMPTOMS:  Positive for dysarthria and some weakness.  No fever,  no chest pain, no shortness of breath and no bleeding episodes.  The rest of  his review of systems is essentially negative.   PHYSICAL EXAMINATION:  GENERAL APPEARANCE:  A pleasant man, very cheerful  but very hard to hear secondary to his dysarthria.  He is in no acute  distress.  VITAL SIGNS:  Temperature 98.3, blood pressure 154/86, pulse 74, respiratory  rate 20, saturation 97% on room air.  HEENT:  Left facial droop, otherwise PERRL, EOMI.  NECK:  Supple.  No JVD, no lymphadenopathy.  CHEST:  Clear to auscultation bilaterally with good air entry.  CARDIOVASCULAR:  Regular rate and rhythm.  ABDOMEN:  Soft and nontender with positive bowel sounds.  EXTREMITIES:  Right lower extremity was inverted.  There was a 5 x 4 cm  ulcer that looked like a decubitus ulcer just below the calf region on the  right leg covered with an Eschar.  The left leg is okay and his power is 4  to 5/5 in both upper and lower extremities on the left.   LABORATORY DATA:  White count of 11.3 with an ANC of 9.9, hemoglobin 12.3,  platelets 271.  Sodium 139, potassium 3.6, chloride 109, CO2 22, BUN 12,  creatinine 1.2, and glucose 98.  His LFTs are essentially negative.  PT  17.6, INR 1.7, PTT 28.   His x-ray showed right hip fracture which is oblique in nature.    ASSESSMENT:  This is a 70 year old man with history of CVA, falls and right  hip fracture.  The patient is admitted for possible surgical repair.  Most  important issue is anticoagulation in the setting of possible surgery  tomorrow.   PLAN:  1. Anticoagulation:  The patient's INR is currently subtherapeutic.  We will     therefore hold his Coumadin for now.  Will start Lovenox both for his     deep venous thrombosis prophylaxis and his pulmonary embolus.  I will not     give any vitamin K or __________ at this point since his INR is not that     high.  Postoperatively, will restart his Coumadin possibly from     postoperative day #1.  2. Falls:  This seemed to be recurrent.  The patient has had three different     falls.  One of them resulted in a __________ hemorrhage.  The patient has     a ramp in his home but he is not using it.  I have discussed with the     patient the fact that he definitely needs to get reassessment on risk     reduction measures put in place at home.  3. Hypertension:  The patient is off his medications, however, his blood     pressure now is elevated.  Will follow it in the hospital and treat if     needed.  The best option probably will be a beta-blocker noting that the     patient has some cardiac risk versus chronic smoking and family history.  4. Hip fracture:  The patient has no cardiac history at this point, although     he has high cardiovascular disease risk.  He also has a cardiovascular     disease equivalent condition which is a cerebrovascular accident and he  has heavy tobacco use.  It will be okay to use a period of beta-blocker     if the patient is going to surgery.  The patient should be okay to     proceed with surgery especially since his heart will stand surgery     successfully.  5. Polysubstance abuse:  The patient abuses tobacco and he has had history    of alcohol abuse in the past.  We have counseled the patient now.  The      patient is not ready to quit but I will provide him some nicotine patches     if needed while in the hospital.  6. Depression:  The patient is on a number of medications and he had     psychiatrist who recently quit practice.  Per     the patient and his wife, his current combination of medications are     doing great for him, so we will keep him on the same medications.  7. Gastroesophageal reflux disease:  Will continue his Nexium at this point,     which is a 20 mg daily.                                               Lonia Blood, M.D.    Verlin Grills  D:  11/07/2003  T:  11/08/2003  Job:  [id number]   cc:   Soyla Murphy. [name], M.D.  [address]  [place]  [place] [address]  Fax: [phone]   Lowell Bouton, M.D.  Fax: [phone]

## 2010-08-09 NOTE — Op Note (Signed)
NAME:  Steven Guerrero, Steven Guerrero                            ACCOUNT NO.:  [id number]   MEDICAL RECORD NO.:  [id number]                   PATIENT TYPE:  INP   LOCATION:  2550                                 FACILITY:  [facility]   PHYSICIAN:  Burnard Bunting, M.D.                 DATE OF BIRTH:  [birth year]   DATE OF PROCEDURE:  09/12/2003  DATE OF DISCHARGE:                                 OPERATIVE REPORT   PREOPERATIVE DIAGNOSIS:  Right hip arthritis.   POSTOPERATIVE DIAGNOSIS:  Right hip arthritis.   OPERATION PERFORMED:  Right total hip replacement.   SURGEON:  Burnard Bunting, M.D.   ASSISTANT:  Jerolyn Shin. Lavender, M.D.   COMPONENTS:  A 4 press-fit femur, 54 mm press-fit cup, 36 mm head, 10 degree  cross-fire polyethylene liner.   ESTIMATED BLOOD LOSS:  650 mL.   DRAINS:  None.   ANESTHESIA:  General.   DESCRIPTION OF PROCEDURE:  The patient was brought to the operating room  where general endotracheal anesthesia was induced.  Preoperative antibiotics  were administered.  The patient was placed in lateral decubitus position  with the left axilla and left peroneal nerve well padded.  The right hip was  facing up.  The right hip, leg and foot was prepped with DuraPrep solution  and draped in sterile manner.  Collier Flowers was used to cover the operative field.  At this time the posterior approach to the hip was utilized.  Skin and  subcutaneous tissue were sharply divided.  The fascia lata was identified  and divided distally.  Proximally, the tensor fascia lata was divided in  line with its fibers. The gluteus maximus tendon was also split in line with  its fibers.  Bleeding points encountered were controlled using  electrocautery.  The sciatic nerve was palpated at this time and was  protected throughout its remaining portion of the case.  The piriformis  tendon was identified, tagged and released from its attachment. External  rotators were then detached.  Capsule was then split in T-shaped  fashion,  each flap held with #0 Ethibond suture. At this point a wing retractor was  placed into the acetabulum. Leg length measurement was made at that time 60  mm.  This was done with a drill bit into the greater trochanter.  At this  time the head was dislocated and a femoral neck cut was made approximately  one fingerbreadth above the lesser tuberosity.  The acetabulum was then  reamed after clearing the soft tissue from the fovea.  Needlization was  first performed followed by sequentially increased reaming.  The acetabulum  was reamed in approximately 45 degrees of abduction and 25 degrees of  anteversion.  Trial cup was placed.  At this time the femur was prepared by  first performing broaching, first by first finding the canal and performing  broaching with the 0 and #1  Accolade press-fit broach.  With the #1 broach  in position, the hip was reduced and stability was found to be intact.  The  stability was found to be good with external rotation, full extension,  external rotation, position of sleep and 90 degrees of hip abduction, 90  degrees of hip flexion and about 50 to 60 degrees of abduction.  At this  time the broach was taken up to a size 3 broach.  Again reduction was  performed with a +4 and +8 head and the hip was again found to be stable in  external rotation, full extension, position of sleep and 90 degrees of  flexion with 50 to 60 degrees of internal rotation.  At this time the size 4  broach was placed and the head was again reduced.  At this time stability  parameters remained intact.  At this time the stability parameters had  changed with the trial liner and became more unstable posteriorly.  The  trial liner, the anteversion was increased in the trial liner which  increased the posterior stability.  A size 4 broach was placed. With the cup  in the new position, the posterior stability was improved.  There was no  anterior instability.  At this time the trial cup  was removed and the true  cup was placed.  A true 54 mm cup was placed with excellent press-fit  obtained.  Anterior osteophytes were removed in order to prevent impingement  and increase posterior stability.  At this time true liner was placed.  Then  the size 4 broach was replaced.  Radiograph was made with a size 3 broach  and the trial liner position and cup positioned and the canal fill looked  good with room to increase the size of the femoral component.  An attempt  was made to seat a size 5 broach but it was not able to be seated.  It  remained about 1.5 cm proud from seating.  At this time the 4 broach was  placed back in position and calcar planing was performed.  Longer neck  lengths were then used to reduce the hip again and the anterior subluxation  but not dislocation was noted.  At this time it was decided to proceed with  change over to a 36 mm liner.  The other cup was removed and the 36 mm trial  liner was placed with the lip anteriorly.  This gave good anterior stability  in full extension and external rotation and posterior stability to 90  degrees of hip flexion and about 45 degrees of internal rotation.  At this  time the true size 4 Accolade hip was placed.  Excellent press-fit was  obtained.  A 36 mm cup liner was placed into the cup with the lip anterior.  The leg lengths were measured at this time and he was found to have been  lengthened by approximately 9 to 10 mm.  This lengthening was necessary to  achieve stability.  The true liner was placed and the size 4 prosthesis was  placed with good press-fit obtained.  A 36 mm head was then tapped into  position.  The hip was reduced and found to have increased his leg lengths  only by 1 cm or less.  Hip stability remained good with stability observed  in full extension and external rotation, position of sleep as well as 90  degrees of hip flexion and 45 to 50 degrees of internal rotation at this  time.  The incision  was thoroughly irrigated.  The capsule was repaired  using a 0 Ethibond suture.  The piriformis tendon was also sutured back to  the capsule.  Fascia lata was repaired using interrupted #1 Vicryl suture,  placed in figure-of-eight fashion. The skin was closed using interrupted  inverted 2-0 Vicryl suture and skin staples.  Impervious dressing was  placed.  Knee immobilizer was placed.  The patient was noted to have equal  leg lengths and good dorsiflexion strength prior to leaving the operating  room.                                               Burnard Bunting, M.D.    GSD/MEDQ  D:  09/12/2003  T:  09/13/2003  Job:  [id number]

## 2010-08-09 NOTE — Discharge Summary (Signed)
NAME:  Steven Guerrero                            ACCOUNT NO.:  [id number]   MEDICAL RECORD NO.:  [id number]                   PATIENT TYPE:  INP   LOCATION:  3732                                 FACILITY:  [facility]   PHYSICIAN:  Burnard Bunting, M.D.                 DATE OF BIRTH:  11/30/1940   DATE OF ADMISSION:  09/12/2003  DATE OF DISCHARGE:  09/30/2003                                 DISCHARGE SUMMARY   DISCHARGE DIAGNOSIS:  Right hip arthritis.   SECONDARY DIAGNOSES:  1. Pulmonary embolus.  2. Postoperative ileus.  3. Hypertension.  4. Subacute history of a stroke in 2000.   PROCEDURE:  1. Right total hip replacement September 12, 2003.  2. PICC line placement with TNA started on September 18, 2003.  3. Neostigmine dosing performed on two occasions September 26, 2003 and September 28, 2003.   CONSULTATIONS:  1. Internal medicine.  2. GI.  3. General surgery.   HOSPITAL COURSE:  Steven Guerrero is a 70 year old patient admitted to the  orthopaedic service on September 12, 2003 after he finally underwent a right  total hip replacement and the patient tolerated the procedure well.  He was  started on Coumadin for DVT prophylaxis.  He was noted to have equal leg  lengths are profuse and equal on postoperative day one.  Hematocrit was 36.4  at that time.  Touchdown weightbearing was planned.  The patient developed a  postoperative ileus.  Internal medicine consultation was obtained.   The patient had distention and KUB consistent with a dynamic ileus.  Medical  management was undertaken.  The patient's incision was intact on  postoperative day three. He was also noted to be short of breath on  postoperative day two and subsequent workup demonstrated pulmonary embolism.  This is treated with continuation of Lovenox.  The patient's ileus failed to  resolve despite conservative measures.   He was transferred to the telemetry floor on September 26, 2003 at which time  neostigmine was performed.  This helped to  resolve his ileus.  He had  subsequently transferred to the internal medicine service on September 28, 2003.  He had improvement in bowel sounds and resolution of ileus by KUB.   By the time of discharge, staples were removed.  At the time of discharge,  leg lengths remained approximately equal with no pain with range of motion  of the hip.  The patient is going to be discharged today in good condition.   DISCHARGE MEDICATIONS:  Plavix, Remeron, Reglan, Lovenox, Zelnorm, Coumadin.  His INR was only 1.1 which is probably an absorption problem.  Protonix,  Bromazepam, Celexa.  I am going to hold on the same medicine which is  discussed with the patient.  He will need to continue on his Lovenox until  his Coumadin becomes therapeutic.   DISCHARGE INSTRUCTIONS:  He will  continue with partial weightbearing as  tolerated on the right lower extremity.  He will see me on October 11, 2003.                                                Burnard Bunting, M.D.    GSD/MEDQ  D:  09/30/2003  T:  10/01/2003  Job:  [id number]

## 2010-08-09 NOTE — Discharge Summary (Signed)
NAME:  Steven Guerrero, Steven Guerrero                            ACCOUNT NO.:  [id number]   MEDICAL RECORD NO.:  [id number]                   PATIENT TYPE:  ORB   LOCATION:  4527                                 FACILITY:  [facility]   PHYSICIAN:  Ranelle Oyster, M.D.             DATE OF BIRTH:  October 07, 1940   DATE OF ADMISSION:  11/14/2003  DATE OF DISCHARGE:  11/28/2003                                 DISCHARGE SUMMARY   DISCHARGE DIAGNOSES:  1.  Right hip open reduction internal fixation.  2.  Right lower extremity ulcers secondary to use of knee immobilizer in the      past.  3.  History of depression.  4.  Postoperative anemia.   HISTORY OF PRESENT ILLNESS:  Steven Guerrero is a 70 year old male with history of  hypertension, subarachnoid hemorrhage in June 2003, right total hip  replacement on June 21, admitted past fall __________  sustaining a right  femoral preprosthetic hip fracture.  The patient underwent ORIF August 17 by  Dr. August Saucer.  Postoperative is nonweightbearing and Coumadin resumed.  INR  remains subtherapeutic.  Postoperative has had issues with anemia requiring  2 units packed red blood cells.  He is also noted to have some mild to  moderate dysarthria at baseline per speech therapy evaluation.  Patient  continues with intermittent fevers, hypokalemia noted and being  supplemented.  Dyspnea is improving.  PT is ongoing with patient having  difficulty maintaining nonweightbearing status with standing and walking.  SACU is consulted for progression.   PAST MEDICAL HISTORY:  1.  Hypertension.  2.  Depression.  3.  GI bleed.  4.  B12 deficiency.  5.  Hernia repair.  6.  CVA in 2000.  7.  Dysarthria.  8.  Subarachnoid hemorrhage.  9.  Past fall in June 2003.  10. Right total hip replacement June 2005.  11. PE in June 2005.   SOCIAL HISTORY:  Patient is married.  Was independent prior to admission.  Lives in one-level home with five steps at entry.  Smokes one pack per day.  Does not  use any alcohol.   HOSPITAL COURSE:  Steven Guerrero was admitted to subacute on November 14, 2003  with SACU level therapies to consist of PT/OT daily.  Past admission patient  was maintained on Coumadin for DVT prophylaxis.  He was noted to have  abrasion on right lower extremity with blister right heel that was noted to  be intact.  He was noted to have dime sized grade 3 ulcer on mid right calf  and right heel blister to have surrounding erythema.  Patient pain control  of right hip ORIF was reasonable.  He has had complaints regarding pain due  to ulcers right lower extremity throughout his stay.  Patient is using  p.r.n. pain medications for pain control.  Pressure relief was recommended  for right lower extremity and Accuzyme  was used for right calf ulcers with  just Betadine for toughening of right heel ulcer initially.  Laboratories  done past admission showed hemoglobin 9.8, hematocrit 28.3, white count 6.2,  platelets 325.  Sodium 138, potassium 3.9, chloride 106, CO2 27, BUN 24,  creatinine 1.2, glucose 94.  LFT slight elevation with SGOT 46, SGPT 57,  alkaline phosphatase 120, Tbili 0.9.  Patient was noted to have mild renal  insufficiency and was encouraged to push fluids.  Postoperative anemia was  supplemented with use of iron supplements.  Follow-up laboratories show  renal insufficiency to be stable on August 29 with BUN at 24, creatinine at  1.1.  Follow-up on postoperative anemia showed H&H to be stable at 9.9 and  29.4.  Patient's ulcers were noted to be worsening and patient developed a  third ulcer between mid shin area during his stay.  Accuzyme was initiated.  Patient's right healing blister ruptured with an open ulcer on heel.  Accuzyme was initiated for all three areas with damp to dry dressing.  He is  noted to have some erythema surrounding the top once ulcer, therefore was  started on Keflex for wound prophylaxis on August 31.  Patient to continue  on Keflex for  another week for wound prophylaxis.  At time of discharge,  ulcers on proximal aspect and on right heel show good clean granulation  tissue.  Central ulcer shows yellow eschar.  He is to continue with Accuzyme  for debridement plus damp to dry dressing changes daily.  PRAFO was ordered  for pressure relief plus advice to have a pillow under knee for elevation of  right lower extremity for pressure relief past discharge.  Patient had  questions regarding not using anything and keeping the area open for  healing.  Discussion regarding wound prophylaxis as well as need for daily  dressing changes plus pressure relief were reinforced with patient and wife  to prevent any further deterioration of healing.  Home health RN has been  arranged to follow dressing changes past discharge.  At time of discharge  patient's INR therapeutic at 2.7.  Patient discharged on 20 mg daily of  Coumadin as INR slowly trending up.  Pro time __________ .  Of note, patient  and wife are not truly aware of need for Coumadin.  However, secondary to  patient's history of PEs, he will continue to be on Coumadin for a lengthy  input with also pro time to be sent __________ .   Secondary to patient's difficulty maintaining weightbearing status,  ambulation goals were limited.  He was advanced touchdown weightbearing for  transfers only.  Currently, at time of discharge patient was on supervision  for assist and transfers, moderate independent for __________  transfers,  close supervision ambulating 10-20 feet with a rolling walker.  He is at  modified independent with some therapy assist for ADLs, modified independent  for toileting.  Wife provide supervision past discharge.  Further  progressive PT/OT to continue by [facility] past discharge.  On  November 28, 2003 patient is discharged to home.   DISCHARGE MEDICATIONS:  1.  Coumadin 10 mg p.o. daily.  2.  Keflex 250 mg q.i.d.  3.  Celexa 40 mg daily. 4.   Neurontin 100 mg t.i.d.  5.  Niferex one p.o. b.i.d.  6.  Remeron 15 mg q.h.s.  7.  Coumadin 5 mg 2 p.o. q.p.m..  8.  Darvocet-N 100 one to two q.4-6h. p.r.n. pain.  9.  Nexium 40 mg daily.   ACTIVITY:  No weight on right lower extremity except for touchdown for  transfers.  Use __________ .  Follow right total hip precautions.   WOUND CARE:  Accuzyme with damp to dry dressing changes.   SPECIAL INSTRUCTIONS:  No alcohol.  No smoking.  No driving.  PRAFO  __________  boot at all times for pressure relief.   FOLLOWUP:  Patient to follow up with Dr. August Saucer in one week.  Follow up Dr.  Riley Kill as needed.      Greg Cutter, P.A.                    Ranelle Oyster, M.D.    PP/MEDQ  D:  11/28/2003  T:  11/28/2003  Job:  [id number]   cc:   Soyla Murphy. [name], M.D.  [address]  [place]  [place] [address]  Fax: [phone]   August Saucer, M.D.

## 2010-08-09 NOTE — Discharge Summary (Signed)
NAME:  Steven Guerrero, Steven Guerrero                            ACCOUNT NO.:  [id number]   MEDICAL RECORD NO.:  [id number]                   PATIENT TYPE:  INP   LOCATION:  3732                                 FACILITY:  [facility]   PHYSICIAN:  Hettie Holstein, D.O.                 DATE OF BIRTH:  [birth year]   DATE OF ADMISSION:  09/12/2003  DATE OF DISCHARGE:                                 DISCHARGE SUMMARY   PRIMARY CARE PHYSICIAN:  Soyla Murphy. [name], M.D.   ADMISSION DIAGNOSIS:  Right hip arthroplasty for right hip arthritis.   DISCHARGE DIAGNOSES:  1. Status post right hip arthroplasty.  2. Development of postoperative persistent ileus which has been the     predominant reason for his prolonged hospitalization.  It has eventually     resolved with two trials of __________ under the direction of Dr. Virginia Rochester.     He had been placed on TPN for several days to resolution of his initially     small bowel ileus and subsequently colonic ileus.  3. Status post CT scan finding of bilateral pulmonary emboli on scan of September 14, 2003.  He developed chest pain, shortness of breath, and decrease in     O2 saturations postoperatively.  Initiation of Coumadin and Lovenox     bridge.  He was not able to become therapeutic on the Coumadin due to his     marginal p.o. intake and persistent ileus.  4. History of cerebrovascular accident and a subarachnoid hemorrhage after a     trauma on December 14, 2001.  5. History of right mandibular fracture.  6. History of transient ischemic attacks in the past.  7. History of hypertension.  8. History of alcohol abuse.  9. History of gastroesophageal reflux disease.  10.      B12 deficiency noted this admission.  Repleted during this     hospitalization and anticipation for monthly B12 injections in the     outpatient setting.  11.      Status post right total hip arthroplasty this admission on September 12, 2003.  12.      Treated urinary tract infection this admission.   DISCHARGE MEDICATIONS:  1. He is instructed to follow directions of Lovenox bridge to a therapeutic     INR with goals of 2 to 3.  He has a follow up appointment with the     [facility] on October 04, 2003, at 10:40 a.m. to follow and adjust on     an outpatient basis.  He is being discharged on Coumadin 5 mg every     evening until then.  Plavix can be held while he is on Coumadin.  2. Resume Celexa.  3. Resume Klonopin.  4. He can resume Micardis after one week after being discharged from the     hospital.  5. He will resume Nexium.  6. He should continue Reglan 10 mg q.i.d. for the next couple of weeks.   FOLLOWUP:  He should follow up with Dr. [name] in two weeks on October 16, 2003,  at 11:15 a.m.  At present, currently waiting the final orthopaedic  disposition with regard to staple removal and clinic follow up with regards  to his arthroplasty.   CONSULTATIONS:  1. Dr. Virginia Rochester.  2. Dr. Violeta Gelinas of general surgery for his persistent ileus.   He can be directed for followup at the discretion of his primary care  physician, Dr. [name], especially if he persists with dysmotility in the  outpatient setting.   HISTORY OF PRESENT ILLNESS:  [name] presented to the hospital to have an  elective arthroplasty per his orthopaedic physician, Dr. Dorene Grebe, and  subsequently during his hospitalization developed some complications,  including a postoperative ileus as well as postoperative pulmonary emboli.  His course was rather uneventful with the exception of problems associated  with his ileus.  He was maintained on TPN for a large portion of his  hospital course here until resolution of his ileus.  Postoperatively, he did  quite well in regards to his hip.  He did well with physical therapy and was  ambulating at time of this dictation.  He required the use of two trials of  _______________during this hospitalization and responded quite well with  radiographic resolution.  At this  time, he is being prepared for discharge  home on the Lovenox bridge.  He appears stable today and is tolerating a  diet, and if he has no problems overnight anticipate he can go home in the  morning.                                                Hettie Holstein, D.O.    ESS/MEDQ  D:  09/29/2003  T:  09/30/2003  Job:  [id number]   cc:   Dr. Preston Fleeting. [name], M.D.  [address]  [place]  [place] [address]  Fax: [phone]   G. Dorene Grebe, M.D.  Fax: [phone]   Georgiana Spinner, M.D.  [address]  [place]  [place] [address]  Fax: [phone]

## 2010-08-09 NOTE — Op Note (Signed)
NAME:  ADEM, COSTLOW                            ACCOUNT NO.:  [id number]   MEDICAL RECORD NO.:  [id number]                   PATIENT TYPE:  INP   LOCATION:  5003                                 FACILITY:  [facility]   PHYSICIAN:  Burnard Bunting, M.D.                 DATE OF BIRTH:  1941/02/26   DATE OF PROCEDURE:  11/08/2003  DATE OF DISCHARGE:                                 OPERATIVE REPORT   PREOPERATIVE DIAGNOSIS:  Right periprosthetic femur fracture.   POSTOPERATIVE DIAGNOSIS:  Right periprosthetic femur fracture.   OPERATION PERFORMED:  Open reduction internal fixation of right  periprosthetic femur fracture.   SURGEON:  Burnard Bunting, M.D.   ASSISTANT:  Vanita Panda. Magnus Ivan, M.D.   ANESTHESIA:  General endotracheal.   ESTIMATED BLOOD LOSS:  750 mL.   DRAINS:  Hemovac times one.   INTRAOPERATIVE HEMATOCRIT:  31.   DESCRIPTION OF PROCEDURE:  The patient was brought to the operating room  where general endotracheal anesthesia was induced.  Preoperative antibiotics  were administered.  The patient was placed in lateral decubitus position  with the left axilla, left peroneal nerve well padded, mild traction was  then applied to the leg and the fracture reduction was observed on the AP  and lateral planes under fluoroscopy.  The leg was then prepped with  DuraPrep solution and draped in sterile manner.  Collier Flowers was used to cover the  operative field.  A lateral incision extending over the fracture was then  made on the thigh.  Skin and subcutaneous tissue was sharply divided.  The  fascia lata was encountered.  Bleeders encountered were controlled using  electrocautery.  Fascia lata was split.  Lateral approach to the femur was  then utilized and using Hibbs retractors, the vastus lateralis was mobilized  anteriorly and then using electrocautery the muscle was bluntly divided off  of the femur.  Crossing perforating vessels were coagulated.  In this way  using the Cobb  elevator, the fracture edges were visualized.  The fracture  pattern itself was spiral in two coronal planes.  The fracture edges were  visualized.  The incision required extension proximally because the proximal  nondisplaced portion of the fracture extended up towards the lesser  trochanter.  The fracture was then reduced and held reduced with two clamps.  Two 45 lag screws were then placed in order to secure the three major  fragments of the fracture.  Following secure fixation with two lag screws, a  locking plate was then applied to the lateral aspect of the femur.  Five  cables were utilized proximally and five bicortical locking screws were  utilized distally.  Excellent fixation was achieved.  Reduction was  confirmed in the AP and lateral planes.  The fracture was made more  difficult by the complex pattern requiring lag screw fixation to avoid the  plate.  At this  time the incision was thoroughly irrigated with pulse lavage  solution.  The vastus lateralis was tacked back down to the bottom of the  fascia using running 0 Vicryl suture.  The fascia lata was closed over a  Hemovac drain using interrupted inverted #1 suture.  Skin was closed using  interrupted inverted 2-0 Vicryl and a combination of  staples and 3-0 nylon.  Impervious dressing was placed.  The patient was  then placed in the supine position and fluoroscopy confirmed continued  reduction of the hip.  At this time compressive wrap was placed around the  thigh.  The patient tolerated the procedure well without any immediate  complications.                                               Burnard Bunting, M.D.    GSD/MEDQ  D:  11/09/2003  T:  11/09/2003  Job:  [id number]
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
## 2010-08-09 NOTE — Procedures (Signed)
[facility]  Patient:    Steven Guerrero, Steven Guerrero                         MRN: [id number] Proc. Date: 05/01/00 Adm. Date:  [id number] Attending:  Sabino Gasser                           Procedure Report  PROCEDURE:  Upper Endoscopy.  GASTROENTEROLOGIST:  Sabino Gasser, M.D.  INDICATIONS:  GERD, rule out Barretts  ANESTHESIA:  Demerol 50 mg, Versed 6 mg.  PROCEDURE IN DETAIL:  With the patient mildly sedated and in the left lateral decubitus position, the Olympus video endoscope was inserted in the mouth, passed under direct vision through the esophagus.  The distal esophagus was approached, and I could not well demonstrate Barretts at this time endoscopically.  We photographed this area.  We did biopsies of the most proximal portion of the gastric appearing mucosa in the esophagus.  We subsequently entered into the stomach.  Fundus, body, antrum, duodenal bulb, and second portion of duodenum were all well visualized and appeared normal. From this point, the endoscope was slowly withdrawn, taking circumferential views of entire duodenal mucosa until the endoscope had been pulled back into the stomach, placed in retroflexion to view the stomach from below, and this showed a mild hiatal hernia with just a mild incomplete wrap of the GE junction around the endoscope.  The endoscope was then straightened and withdrawn taking circumferential views of entire gastric and subsequently esophageal mucosa which otherwise appeared normal.  The patients vital signs and pulse oximetry remained stable.  The patient tolerated the procedure well with no apparent complications.  FINDINGS:  Question of Barretts esophagus, biopsied.  PLAN:  Await biopsy report.  The patient will call me for results and follow up with me as an outpatient. DD:  05/01/00 TD:  05/02/00 Job: [id number]

## 2010-08-09 NOTE — Consult Note (Signed)
NAME:  Steven Guerrero, Steven Guerrero                            ACCOUNT NO.:  [id number]   MEDICAL RECORD NO.:  [id number]                   PATIENT TYPE:  INP   LOCATION:  5005                                 FACILITY:  [facility]   PHYSICIAN:  Bernette Redbird, M.D.                DATE OF BIRTH:  Nov 25, 1940   DATE OF CONSULTATION:  09/18/2003  DATE OF DISCHARGE:                                   CONSULTATION   Dr. Dorene Grebe of the orthopedic service asked me to see this 70 year old  gentleman because of a postoperative colonic ileus.   Mr. Caba has actually been colonoscoped in the past by Dr. Virginia Rochester, several  years ago, apparently without significant findings.   The current problem is that he underwent total hip replacement surgery about  a week ago and since then has had problems with an ileus characterized by  abdominal distention and discomfort.  A trial of various medical  interventions, including IV Reglan and minimization of narcotic pain  medications, has been undertaken and more recently a rectal tube was  inserted but so far with only modest results.  As of the moment, the patient  does indicate that he feels a little bit better than yesterday with just  some diffuse midabdominal discomfort but no excruciating pain and his KUB  from today shows a slightly decreased cecal diameter and less overall gas as  compared to the previous KUB.  The KUB does show a moderate amount of small  bowel gas and most of the colonic gas is in the cecum, the ascending colon,  and the transverse colon.   The patient has been passing a fair amount of gas since a rectal tube was  inserted yesterday.   ALLERGIES:  No known allergies.   CURRENT MEDICATIONS:  1. Docusate.  2. Coumadin.  3. Plavix.  4. Protonix.  5. Remeron.  6. Celexa.  7. HCTZ.  8. Lovenox.  9. Metoclopramide.  10.      Klonopin.  11.      Avapro.  12.      Cipro.  13.      Robaxin p.r.n.  14.      Ativan p.r.n.  15.      Percocet  p.r.n.   OPERATIONS:  Total hip replacement and a hernia repair.   MEDICAL ILLNESSES:  A history of a CVA and I believe a brain hemorrhage.  No  known cardiopulmonary disease but there is a history of hypertension, no  diabetes.   HABITS:  The patient smokes one pack per day and has done so for many years  and has only occasional alcohol.   FAMILY HISTORY:  Not obtained.   SOCIAL HISTORY:  Not obtained but the patient's wife is also at the bedside.   REVIEW OF SYSTEMS:  At home, he has pretty much a regular daily bowel  movement without need for ongoing  laxative use.   PHYSICAL EXAMINATION:  GENERAL:  A somewhat elderly-appearing male with a  protuberant abdomen lying in bed in no evident distress.  His wife notes  that, even at baseline, his abdomen is somewhat protuberant but not to the  degree currently seen.  He is anicteric and without frank pallor.  ABDOMEN:  Tympanitic and fairly tense, slightly tender but not exquisitely  so, not rock hard, no evident rebound.  Sparse bowel sounds are present.  A  rectal tube is present and is draining brown thick liquid stool.   LABORATORY DATA:  Labs most recently were obtained two days ago and were  unremarkable with particular reference to a normal white count of 6300 and a  normal potassium level three days ago of 4.1.   IMPRESSION:  Mixed small and large bowel postoperative ileus which appears  to be gradually improving both symptomatically and radiographically.   RECOMMENDATIONS:  1. Add Zelnorm as a prokinetic agent.  2. Hold off on decompressive colonoscopy for the time being, although this     decision will be reviewed day by day.  The reasoning behind this is that     the patient does appear to be getting better over the past 24 hours and     in view of the fact that most of the gas on the x-ray is proximal in     location, and in fact much of it is in the small bowel where it is     inaccessible to colonoscopic extraction.   My concern is that in     attempting to reach the proximal large bowel for decompression, in the     context of an already-decompressed distal colon, I would have to     insufflate a fair amount of air and possibly use pain medication to help     [place] get the scope around to the proximal colon.  This may leave the     patient with more air and/or worsened ileus compared to what he has even     now.  3. Follow the patient symptomatically and by exam on a daily basis with     periodic monitoring of electrolytes and KUB.   Overall, I think his prognosis is good and we will probably see progressive  gradual improvement without the need for colonoscopic decompression but  again we will need to review that day by day as time goes on.                                               Bernette Redbird, M.D.    RB/MEDQ  D:  09/18/2003  T:  09/18/2003  Job:  [id number]   cc:   Soyla Murphy. [name], M.D.  [address]  [place]  [place] [address]  Fax: [phone]   Georgiana Spinner, M.D.  [address]  [place]  [place] [address]  Fax: [phone]   G. Dorene Grebe, M.D.  Fax: [phone]

## 2010-08-09 NOTE — Consult Note (Signed)
NAME:  Steven Guerrero, Steven Guerrero                            ACCOUNT NO.:  [id number]   MEDICAL RECORD NO.:  [id number]                   PATIENT TYPE:  INP   LOCATION:  5005                                 FACILITY:  [facility]   PHYSICIAN:  Gabrielle Dare. Janee Morn, M.D.             DATE OF BIRTH:  [birth year]   DATE OF CONSULTATION:  DATE OF DISCHARGE:                                   CONSULTATION   REASON FOR CONSULTATION:  Ileus.   HISTORY OF PRESENT ILLNESS:  The patient is a 70 year old white male who is  known to me from admission to the trauma service in the past.  The patient  is now status post right total hip arthroplasty by Dr. Shelle Iron.  He has  subsequently developed a postoperative ileus.  He is being kept n.p.o. at  this time.  He also suffered a postoperative pulmonary embolus, but has  improved from that.  Today, he claims his abdomen is feeling significantly  better.  He had had some pain recently in the past several days, but he says  it is significantly better today.  He did pass a few small bits of air per  rectum, but remains quite distended.   Abdominal x-rays today showed an ileus of the small and large bowel with  slight improvement.   PAST MEDICAL HISTORY:  1. Osteoarthritis.  2. Hypertension.  3. Bradycardia.  4. Pulmonary embolus.  5. Cerebrovascular accident.  6. Depression.  7. Gastroesophageal reflux disease.   PAST SURGICAL HISTORY:  1. Hernia repair.  2. Total hip replacement as described.   FAMILY HISTORY:  Cardiac disease.   SOCIAL HISTORY:  He occasionally drinks alcohol.  He smokes one pack of  cigarettes per day.   REVIEW OF SYSTEMS:  GENERAL:  Negative.  PULMONARY:  No acute complaints.  CARDIAC:  No complaints at this time.  GI:  See the history of present  illness.  GU:  No complaints.  MUSCULOSKELETAL:  He has had recent hip  surgery and osteoarthritis.  The remainder of the review of systems is  noncontributory on physical examination.   PHYSICAL  EXAMINATION:  VITAL SIGNS:  Temperature 99, blood pressure 123/76,  heart rate is 86, respirations 20.  GENERAL:  He is awake, alert, and not in any acute distress.  HEENT:  Pupils are equal and reactive.  NECK:  Soft, with no masses.  LUNGS:  Clear to auscultation bilaterally.  HEART:  Regular rate and rhythm.  PMI is palpable in the left chest.  PULSES:  The distal pulses are 1 to 2+ at the radial artery bilaterally.  ABDOMEN:  Distended.  Bowel sounds are markedly hypoactive.  He has no  discrete masses and no appreciable tenderness at this time.  SKIN:  Warm and dry with no rashes.   LABORATORY DATA:  Abdominal x-rays from today shows an ileus of the small  and large bowel with  slight improvement compared to previous.   IMPRESSION:  Postoperative ileus.   RECOMMENDATIONS:  1. I agree with keeping him n.p.o. and the recommendations that GI has added     including following his exam closely and checking some electrolytes and     getting a followup KUB tomorrow.  2. There is no need at this time for acute surgical intervention.  3. I will follow him closely with you.   Thank you very much for this consultation.                                               Gabrielle Dare Janee Morn, M.D.    BET/MEDQ  D:  09/18/2003  T:  09/18/2003  Job:  [id number]

## 2010-08-09 NOTE — Discharge Summary (Signed)
NAMECHALMERS, IDDINGS                  ACCOUNT NO.:  [id number]   MEDICAL RECORD NO.:  [id number]          PATIENT TYPE:  INP   LOCATION:  5003                         FACILITY:  [facility]   PHYSICIAN:  Burnard Bunting, M.D.    DATE OF BIRTH:  14-Aug-1940   DATE OF ADMISSION:  11/07/2003  DATE OF DISCHARGE:  11/14/2003                                 DISCHARGE SUMMARY   DISCHARGE DIAGNOSES:  Right femur periprosthetic fracture.   SECONDARY DIAGNOSES:  1.  Depression.  2.  Hypertension.  3.  __________.  4.  Transient ischemic attack.  5.  Cerebrovascular accident.  6.  Speech dysarthria.  7.  Pulmonary embolism.   OPERATION/PROCEDURE:  Right hip periprosthetic fracture open reduction  internal fixation performed November 08, 2003.   HISTORY OF PRESENT ILLNESS:  Steven Guerrero is a 70 year old patient who had a  pelvic replacement September 12, 2003.  He was doing well after that prolonged  hospitalization when he was at home and fell.  Landed on his right leg and  sustained a periprosthetic femur fracture.   HOSPITAL COURSE:  Patient was admitted to the orthopedic service.  Internal  medicine consultation was obtained.  Compartments were soft at time of  admission.  INR was 1.7.  Patient was given vitamin K in order to decrease  his INR.  He underwent open reduction internal fixation of his  periprosthetic femur fracture November 08, 2003.  Postoperatively the patient  had equal leg lengths with intact dorsiflexion, plantar flexion, perfused  foot.  He was mobilized with physical therapy, nonweightbearing on that  right lower extremity.  Unlike his prior hospitalization the patient  progressed well with his diet and had no postoperative ileus.  He was  transfused 2 units of packed red blood cells on postoperative day #3 for  hematocrit of 22.  Patient had an otherwise uneventful recovery.  Incision  was intact at the time of discharge.  He is transferred to the [facility] in good  condition.  He will  continue nonweightbearing for six weeks total.   DISCHARGE MEDICATIONS:  1.  Admission medications.  2.  Coumadin.  3.  Percocet for pain.  4.  Robaxin for muscle relaxor.   He will follow up with me in several weeks for repeat x-rays.  Skin staples  will be removed in the [facility].       GSD/MEDQ  D:  12/27/2003  T:  12/27/2003  Job:  [id number]

## 2010-08-09 NOTE — Discharge Summary (Signed)
NAME:  Steven Guerrero, Steven Guerrero                            ACCOUNT NO.:  [id number]   MEDICAL RECORD NO.:  [id number]                   PATIENT TYPE:  INP   LOCATION:  3103                                 FACILITY:  [facility]   PHYSICIAN:  Jimmye Norman, M.D.                   DATE OF BIRTH:  [birth year]   DATE OF ADMISSION:  12/14/2001  DATE OF DISCHARGE:  01/06/2002                                 DISCHARGE SUMMARY   DISCHARGE DIAGNOSES:  1. Fall.  2. Closed head injury.  3. Contusion.  4. Chin laceration.  5. Tongue laceration.  6. Rib fractures.  7. Bilateral mandibular fracture.  8. Alcohol abuse.  9. Hypertension.  10.      Previous cerebrovascular accident x3 in the past.   HISTORY OF PRESENT ILLNESS:  This is a 70 year old gentleman was standing on  top of a ladder which was balanced on a box inside his truck when trying to  cut limbs.  He subsequently fell approximately 20 feet with questionable  loss of consciousness.  He was brought to [facility] Emergency Room and  workup was performed.  Chest x-ray shows left rib fractures x2 with no  pneumothorax.  A left frontal contusion was noted.  He also had mandibular  fractures noted.  The patient was subsequently hospitalized and Dr. Egbert Garibaldi saw  him for OMF and noted the patient would need a Panorex film prior to any  type of treatment.   HOSPITAL COURSE:  He was hospitalized on 2900.  He was quite agitated at  times.  He had a C-collar in place.  Because of the agitation, he was given  medications of Ativan and morphine.  He subsequently was also given  Dilaudid.  He was overmedicated and became quite somnolent and subsequently  required intubation.  There was also a question of a possible MI.  MI was  never totally ruled in or out.  This possibly, according to the  cardiologist, could be secondary to his head injury.  He did remain  intubated.  CCM was consulted for the management of his stent.  He was  treated by CCM and he did have  some pulmonary edema which resolved.  He  subsequently was extubated on December 27, 2001.  At that time, he was  following commands satisfactorily.  He did have difficulty speaking and  remained quite soft-spoken during his stay.  He was moving all extremities  without difficulty.  C-spine flexion and extensions showed an approximately  1 mm anterior listhesis of C6-C7.  Because of this, he is continued in the C-  collar.  In speaking with Dr. Lovell Sheehan, he noted that the patient should stay  in the C-collar so that he could note that he had no neck pain and no neck  pain on exam.  At this time, he is doing well.  He did have  a Panda tube in  and this was discontinued by the patient.  He was put on oral foods.  The  patient had swallow study and did satisfactorily with this.  He was given  soft food secondary to mandibular fracture.  The Panorex was done on January 05, 2002.  The Panorex noted the bilateral mandibular fractures consisting  of displaced components of the angle of the left mandible, displaced  condylar fracture on the right, subluxation and probable nondisplaced ramus  fracture on the right.  Dr. Egbert Garibaldi, who initially saw the patient in his  office, was consulted.  Dr. Egbert Garibaldi was out and one of the other physicians  looked at the Panorex and noted that the patient would not need any surgical  intervention.  He should continue on a soft diet for the next six to eight  weeks for the mandibular fracture.  Cardiology's final note noted that the  patient should undergo a Cardiolite study again in the next few months after  he is discharged from rehabilitation.  He has MIs unknown at this time, so  followup would be the best thing to do at this time.  This should be  discussed with the patient's family prior to this discharge from  rehabilitation.  He is  otherwise doing satisfactory at this time.  He is awake and alert at this  time, moving all extremities without difficulty and  tolerating a soft diet  satisfactorily.  At this time, he is ready for discharge and will be  transferred to rehabilitation today.     Steven Guerrero, P.A.                      Jimmye Norman, M.D.    CL/MEDQ  D:  01/06/2002  T:  01/08/2002  Job:  [id number]   cc:   Cristi Loron, M.D.   Scott R. Egbert Garibaldi, D.D.S.  [address]  [place]  [place] [address]  Fax: [phone]   Clydene Fake, M.D.   Coletta Memos, MD
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
## 2010-08-09 NOTE — Op Note (Signed)
[facility]  Patient:    Steven Guerrero, Steven Guerrero                         MRN: [id number] Proc. Date: 04/20/00 Adm. Date:  [id number] Attending:  Carson Myrtle                           Operative Report  PREOPERATIVE DIAGNOSIS:  Left inguinal hernia.  POSTOPERATIVE DIAGNOSIS:  Left direct inguinal hernia.  OPERATION PERFORMED:  Repair of hernia with mesh.  SURGEON:  Kendrick Ranch, M.D.  ANESTHESIA:  General supervised Dr. Council Mechanic.  HISTORY OF PRESENT ILLNESS:  Mr. Prindle is 66 otherwise healthy, controlled hypertension and has a left inguinal hernia and he wishes now to have a repair. It has been carefully discussed with him in the office.  DESCRIPTION OF PROCEDURE:  The patient was brought to the operating room, placed supine, general endotracheal anesthesia administered. There was an accidental dislodgment of a lower tooth that is described Dr. Council Mechanic. Otherwise general anesthesia went well. The left groin was shaved, prepped and draped in the usual fashion. Marcaine 0.5% with epinephrine was injected throughout prior to incision. A short horizontal incision made over the palpable defect and the superficial and deep subcutaneous tissue dissected, the external oblique identified. A large cord was dissected from the floor of the canal and retracted laterally. A moderate to large direct hernia at the internal ring was dissected free from the cord structures which were reflected laterally. The hernia contents were reduced and a large plug of mesh was placed and fit nicely and reduced the contents and closed the hernia defect. It was sewn in place with a running #0 Prolene. This completed the procedure. All areas were checked for bleeding and there was none. The cord was replaced in its anatomic position, the external oblique closed with a running 2-0 Vicryl, deep subcu 2-0 Vicryl, skin with 3-0 monocryl. Counts correct. He tolerated it well and was awakened  and taken to the recovery room in good condition.  Written and verbal instructions were given to him and his wife including Percocet 5/325 #36 and he will be seen and followed as an outpatient. DD:  04/20/00 TD:  04/20/00 Job: [id number]

## 2010-08-09 NOTE — Discharge Summary (Signed)
NAME:  Steven Guerrero, Steven Guerrero                            ACCOUNT NO.:  [id number]   MEDICAL RECORD NO.:  [id number]                   PATIENT TYPE:  IPS   LOCATION:  4025                                 FACILITY:  [facility]   PHYSICIAN:  Ellwood Dense, M.D.                DATE OF BIRTH:  23-Aug-1940   DATE OF ADMISSION:  01/06/2002  DATE OF DISCHARGE:  01/27/2002                                 DISCHARGE SUMMARY   DISCHARGE DIAGNOSES:  1. Subarachnoid hemorrhage after trauma on 12/14/01.  2. Right mandibular head fracture.  3. Dysphagia, resolving.  4. Sinus bradycardia.  5. History of transient ischemic attack.  6. Hypertension.  7. Depression.  8. History of tobacco and alcohol abuse.  9. Left heel ulcer, 3 cm.  10.      Left rib fractures.   HISTORY OF PRESENT ILLNESS:  This is a 70 year old white male who was  admitted on 12/14/01, after a fall while cutting tree limbs, questionable  loss of consciousness.  Upon evaluation cranial CT scan showed scattered  areas of subarachnoid hemorrhage with tiny intraparenchymal hemorrhage and  comminuted fracture of the right mandibular head.  Also noted remote lacunar  infarction.  Cervical spine films negative.  Noted left rib fractures.  Neurosurgery, Dr. Coletta Memos, with conservative care of subarachnoid  hemorrhage.  Hospital course with dysphagia.  Modified barium swallow on  12/31/01, maintained on a dysphagia 2 all liquid diet.  Panda tube had been  in place for a short time.  A cervical collar remained in place until the  patient was able to cooperate with flexion extension films to further  evaluate cervical spine.  Bouts of sinus bradycardia at 30 beats per minute.  Echocardiogram with ejection fraction 55 to 65% without effusion, and normal  left ventricular function.  Cardiology followup, monitored.  Planned for  Cardiolite study as an outpatient.  Latest cranial CT scan on 01/03/02,  without significant changes.  Required max assist  for sitting to stand  position.  Soft restraints for safety.  Latest chemistries unremarkable.  Noted alcohol level of 87 on admission.  He was admitted for a comprehensive  rehabilitation program.   PAST MEDICAL HISTORY:  See discharge diagnoses.   PAST SURGICAL HISTORY:  1. Hernia repair.  2. Carpal tunnel release.   PRIMARY CARE PHYSICIAN:  Dr. Merri Brunette.   ALLERGIES:  No known drug allergies.   MEDICATIONS:  1. Remeron.  2. Celexa.  3. Hyzaar.  4. Plavix.  5. Nexium.  6. Klonopin.   SOCIAL HISTORY:  He lives with his wife in [place].  Independent prior to  admission.  Noted higher executive cognitive deficit senses, transient  ischemic attack three years ago.  He still works as a Product/process development scientist.  Wife works and travels.  Step-daughter to assist at discharge.  They live in  a one level home with six steps to  entry.   HOSPITAL COURSE:  The patient did well while in rehabilitation services with  therapies initiated on a b.i.d. basis.  The following issues were followed  during the patient's rehabilitation course:  Pertaining to the patient's  subarachnoid hemorrhage, traumatic brain injury secondary to trauma on  12/14/01, remained stable.  Waist belt and side rails had remained in place  for patient's safety.  These were later discontinued as his awareness  continued to improve.  He was maintained for a short time on Ritalin with  minimal results.  His level of alertness, awareness continued to improve.  He was cooperative with staff, and was able to initiate much more with his  activities of daily living and feeding.  He would receive supervision at  home.  As pertaining to his right mandibular head fracture, he received  followup with oral surgery with conservative care, no further plan at this  time.  His swallowing had since been advanced to mechanical soft after his  cervical collar had been removed after flexion extension films had been  reviewed by  neurosurgery showing minimal retrolisthesis of C3 on C4 and  extension.  Cervical collar had been discontinued.  The patient is doing  well at that time.  He would continue to receive followup per neurosurgery  pertaining to his subarachnoid hemorrhage.  He had a history of transient  ischemic attacks three years ago.  His Plavix had since been resumed on  01/26/02.  Blood pressure was controlled on no present medications, although  he was on Hyzaar prior to his hospital admission.  He had a long documented  history of depression, alcohol, and tobacco abuse.  He continued on a  combination of Celexa, Remeron, and Klonopin as prior to his hospital  admission.  His left heel ulcer continued to improve with routine skin  checks and monitored.  Overall for his functional mobility, he was requiring  simple set up for his bathing and dressing, toileting.  He was continent of  bowel and bladder.  Supervision for bed to chair transfers, ambulating 200  feet with supervision.  Again as noted, home supervision would be provided,  and outpatient therapies had been arranged.   LABORATORY DATA:  Latest labs showed a sodium of 136, potassium 4.3, BUN 23,  creatinine 1.0, hemoglobin 11.6, hematocrit 34.2.   DISCHARGE MEDICATIONS:  1. Klonopin 0.5 mg at bedtime.  2. Remeron 15 mg at bedtime.  3. Nexium q.d.  4. Multivitamin daily.  5. Celexa 40 mg q.d.  6. Vicodin p.r.n. pain.   ACTIVITY:  As tolerated with supervision for safety.   DIET:  Soft.   SPECIAL INSTRUCTIONS:  1. No drinking, no smoking, no driving.  2. Twenty-four hour supervision for the patient's safety.   FOLLOWUP:  1. He should follow up with Dr. Coletta Memos of neurosurgery, [phone],     pertaining to his subarachnoid hemorrhage.  2. Dr. Merri Brunette for medical management.  3. Dr. Ellwood Dense, [phone], at the outpatient [facility].      Mariam Dollar, P.A.                     Ellwood Dense, M.D.    DA/MEDQ  D:  01/26/2002  T:  01/27/2002  Job:  [id number]   cc:   Coletta Memos, M.D.  [address]  [place]  [place] [address]  Fax: [phone]   Soyla Murphy. [name], M.D.  [address]  [address]  [place]  [place] [address]  Fax: [phone]   Pennelope Bracken, P.A. [facility]  [place], [place] [address]  Fax: [phone]

## 2010-08-09 NOTE — Discharge Summary (Signed)
NAME:  Steven Guerrero, Steven Guerrero NO.:  [id number]   MEDICAL RECORD NO.:  [id number]            PATIENT TYPE:   LOCATION:                                 FACILITY:   PHYSICIAN:  Burnard Bunting, M.D.         DATE OF BIRTH:   DATE OF ADMISSION:  03/20/2004  DATE OF DISCHARGE:  03/28/2004                                 DISCHARGE SUMMARY   DISCHARGE DIAGNOSES:  Right periprosthetic femur fracture.   SECONDARY DIAGNOSES:  1.  Postoperative ileus.  2.  History of a right total hip replacement June 2005 with right      periprosthetic femur fracture November 08, 2003.  3.  History of stroke August 2000.  4.  Full brain hemorrhage in September 2003.  5.  Early Alzheimer's.   OPERATIONS/PROCEDURES:  Right hip femur fracture, periprosthetic femur  fracture, open reduction internal fixation performed March 21, 2004.   HOSPITAL COURSE:  Steven Guerrero is a 70 year old patient with a periprosthetic  femur fracture which was repaired August 2005.  The patient went on to non-  union and now presents with a broken plate.  Patient was admitted to the  surgical service March 20, 2004.  INR was sequentially decreasing over  that time period.  Once the patient's INR became subtherapeutic he was taken  to the OR on March 21, 2004 at which time he underwent removal of  hardware, cable grafting, and long stem revision for his right  periprosthetic femur fracture.  Patient tolerated procedure well without any  immediate complications.  Leg lengths were approximately equal to  dorsiflexion, plantar flexion, strength noted.  Internal medicine followed  the patient for management of medical problems.  He was started on Coumadin  and Lovenox for DVT prophylaxis.  Nutritional consultation was obtained.  Patient was transferred from the intensive care unit to the floor on  March 23, 2006.  Patient developed a postoperative ileus which required  GI consultation.  That ileus resolved by the time  of discharge and he was  tolerating clear diet.  Incision was intact on postoperative day #3.  Patient had an otherwise unremarkable recovery.  Discharged to the [facility] in  good condition on March 29, 2004.   DISCHARGE MEDICATIONS:  Coumadin, Nexium, lorazepam, Celexa, Remeron, fish  oil, and vitamins as well as Darvocet for pain.   He will follow up with me in a week for suture removal.  Continue with  partial weightbearing as tolerated.           ______________________________  G. Dorene Grebe, M.D.     GSD/MEDQ  D:  06/30/2005  T:  06/30/2005  Job:  [id number]

## 2010-08-09 NOTE — Op Note (Signed)
NAME:  GROVE, DEFINA                  ACCOUNT NO.:  [id number]   MEDICAL RECORD NO.:  [id number]          PATIENT TYPE:  INP   LOCATION:  5036                         FACILITY:  [facility]   PHYSICIAN:  Burnard Bunting, M.D.    DATE OF BIRTH:  04/09/1940   DATE OF PROCEDURE:  03/26/2003  DATE OF DISCHARGE:                                 OPERATIVE REPORT   PREOPERATIVE DIAGNOSIS:  Right femur periprosthetic fracture with hardware  failure.   POSTOPERATIVE DIAGNOSIS:  Right femur periprosthetic fracture with hardware  failure.   PROCEDURE:  1.  Right femur removal of hardware.  2.  Right hip long stem revision.  3.  Right femur periprosthetic fracture  open reduction and internal      fixation with strut grafting and cable fixation.   SURGEON:  Burnard Bunting, M.D.   ASSISTANT:  Jerolyn Shin. Lavender, M.D.   ESTIMATED BLOOD LOSS:  800 mL.   DRAINS:  Hemovac x 2.   CULTURES:  One.   PROCEDURE IN DETAIL:  The patient was brought to the operating room where  general endotracheal anesthesia was induced.  Preoperative IV antibiotics  were administered.  The patient was placed in the lateral decubitus position  with a left axillary roll and the left peroneal nerve and foot well padded.  The right leg was then prepped with DuraPrep solution and draped in a  sterile manner including the foot.  The operative field was covered with  Ioban.  The initial approach to the hip was utilized through a prior  incision.  The skin and subcutaneous tissue were sharply divided.  The  fascia lata was encountered and divided.  At this time, the gluteus maximus  muscle was split in the direction of its fibers.  The sciatic nerve was  dissected and tagged with a vessel loop.  It was protected at all times  during the remaining portion of the case.  The soft tissue was then  dissected circumferentially from around the acetabulum, heterotopic  ossification was encountered and removed in order to facilitate  dislocation.  The presence of the heterotopic ossification did make dislocation difficult.  At this time, the hip was dislocated and the ball was removed.  The soft  tissue from the shoulder of the trochanter was removed.  The small flexible  osteotomes were then used break up the bone interface between the prosthesis  and the femur.  Bony ingrowth had occurred.  The prosthesis was removed  after some effort.  Following removal of the prosthesis, attention was  turned towards the hardware removal.  The incision was extended distally and  the fascia lata was split, the vastus lateralis was elevated anteriorly off  the bone.  The plate was broken, the cables were removed.  Screws from the  plate and the interfragmentary screws were also removed.  The fracture was  then realigned.  At this time, cylindrical reaming was performed with the  fracture reduced manually.  Following cylindrical reaming to 17 mm, a trial  prosthesis was placed.  Following placement of the trial prosthesis,  the  interoperative x-ray was obtained which demonstrated good fill of the canal.  The femur fracture was then repaired by placing two strut grafts and five  cables around the femoral shaft.  The strut grafts were allograft femur.  A  stable reduction was achieved of the femoral shaft fracture.  The fracture  did extend down below the original fracture site and it required this cable  fixation.  The total distance of the extension of the fracture was  approximately 4-5 cm.  At this time, with the trial prosthesis in place,  reduction was performed with multiple anteversion and femoral neck lengths.  Good stability was achieved with a standard and +10 neck.  At this time, the  trial prosthesis was removed and the true prosthetic stem was placed.  Again, the second proximal portion of the component was then reamed and a  trial was performed with the standard size neck in approximately 20 degrees  of anteversion.  The  hip was found to be stable in external rotation and  full extension, the position of sleep, 90 degrees of hip flexion, 10 degrees  of abduction, and 45 degrees of internal rotation.  At this time, the trial  proximal portion was removed and the true proximal portion was tapped into  position, with repeat reduction, stability parameters had changed which  necessitated going to a +10 proximal revision set.  With the +10 prosthesis  in place in appropriate anteversion and the +10 head, excellent stability of  the parameters were achieved in full extension and external rotation,  position of sleep, and 90 degrees of hip flexion, 10 degrees abduction, and  45 degrees of internal rotation.  At this time, the true head was placed.  The acetabular socket, itself, was in good condition and the acetabular cuff  was stable.  At this time, the incision was thoroughly irrigated and  radiograph demonstrated excellent position of the hardware and no other  complicating features.  The prosthetic stem did come down to the level of  the last screw hole, the last screw hole was bone grafted.  The fracture  site, itself, was also bone grafted with heterotopic bone removed from the  femur.  Two Hemovac drains were placed.  The incision was again thoroughly  irrigated.  The sciatic nerve was palpated, visualized, and found to be  intact.  The fascia lata was closed using #1 Vicryl suture.  The incision  was then closed using interrupted inverted 2-0 Vicryl suture and skin  staples.  An impervious dressing was placed.  The patient tolerated the  procedure well without immediate complications.  A knee immobilizer was  placed.       GSD/MEDQ  D:  03/25/2004  T:  03/25/2004  Job:  [id number]

## 2011-04-15 DIAGNOSIS — Z7901 Long term (current) use of anticoagulants: Secondary | ICD-10-CM | POA: Diagnosis not present

## 2011-04-15 DIAGNOSIS — I6990 Unspecified sequelae of unspecified cerebrovascular disease: Secondary | ICD-10-CM | POA: Diagnosis not present

## 2011-04-15 DIAGNOSIS — I2699 Other pulmonary embolism without acute cor pulmonale: Secondary | ICD-10-CM | POA: Diagnosis not present

## 2011-05-19 DIAGNOSIS — I82890 Acute embolism and thrombosis of other specified veins: Secondary | ICD-10-CM | POA: Diagnosis not present

## 2011-05-19 DIAGNOSIS — Z7901 Long term (current) use of anticoagulants: Secondary | ICD-10-CM | POA: Diagnosis not present

## 2011-05-19 DIAGNOSIS — I6990 Unspecified sequelae of unspecified cerebrovascular disease: Secondary | ICD-10-CM | POA: Diagnosis not present

## 2011-05-19 DIAGNOSIS — I2699 Other pulmonary embolism without acute cor pulmonale: Secondary | ICD-10-CM | POA: Diagnosis not present

## 2011-06-03 DIAGNOSIS — I824Y9 Acute embolism and thrombosis of unspecified deep veins of unspecified proximal lower extremity: Secondary | ICD-10-CM | POA: Diagnosis not present

## 2011-06-03 DIAGNOSIS — I82409 Acute embolism and thrombosis of unspecified deep veins of unspecified lower extremity: Secondary | ICD-10-CM | POA: Diagnosis not present

## 2011-06-03 DIAGNOSIS — I2699 Other pulmonary embolism without acute cor pulmonale: Secondary | ICD-10-CM | POA: Diagnosis not present

## 2011-06-03 DIAGNOSIS — Z7901 Long term (current) use of anticoagulants: Secondary | ICD-10-CM | POA: Diagnosis not present

## 2011-07-08 DIAGNOSIS — Z7901 Long term (current) use of anticoagulants: Secondary | ICD-10-CM | POA: Diagnosis not present

## 2011-07-08 DIAGNOSIS — I824Y9 Acute embolism and thrombosis of unspecified deep veins of unspecified proximal lower extremity: Secondary | ICD-10-CM | POA: Diagnosis not present

## 2011-07-08 DIAGNOSIS — I2699 Other pulmonary embolism without acute cor pulmonale: Secondary | ICD-10-CM | POA: Diagnosis not present

## 2011-07-18 ENCOUNTER — Emergency Department (HOSPITAL_COMMUNITY): Payer: Medicare Other

## 2011-07-18 ENCOUNTER — Encounter (HOSPITAL_COMMUNITY): Payer: Self-pay | Admitting: *Deleted

## 2011-07-18 ENCOUNTER — Emergency Department (HOSPITAL_COMMUNITY)
Admission: EM | Admit: 2011-07-18 | Discharge: 2011-07-18 | Disposition: A | Discharge status: Home / Self Care | Payer: Medicare Other | Attending: Emergency Medicine | Admitting: Emergency Medicine

## 2011-07-18 DIAGNOSIS — E7800 Pure hypercholesterolemia, unspecified: Secondary | ICD-10-CM | POA: Insufficient documentation

## 2011-07-18 DIAGNOSIS — Z8679 Personal history of other diseases of the circulatory system: Secondary | ICD-10-CM | POA: Diagnosis not present

## 2011-07-18 DIAGNOSIS — Z79899 Other long term (current) drug therapy: Secondary | ICD-10-CM | POA: Insufficient documentation

## 2011-07-18 DIAGNOSIS — Z77090 Contact with and (suspected) exposure to asbestos: Secondary | ICD-10-CM | POA: Diagnosis not present

## 2011-07-18 DIAGNOSIS — I10 Essential (primary) hypertension: Secondary | ICD-10-CM | POA: Insufficient documentation

## 2011-07-18 DIAGNOSIS — R079 Chest pain, unspecified: Secondary | ICD-10-CM | POA: Insufficient documentation

## 2011-07-18 DIAGNOSIS — R071 Chest pain on breathing: Secondary | ICD-10-CM | POA: Diagnosis not present

## 2011-07-18 DIAGNOSIS — J984 Other disorders of lung: Secondary | ICD-10-CM | POA: Diagnosis not present

## 2011-07-18 HISTORY — DX: Essential (primary) hypertension: I10

## 2011-07-18 HISTORY — DX: Other pulmonary embolism without acute cor pulmonale: I26.99

## 2011-07-18 HISTORY — DX: Cerebral infarction, unspecified: I63.9

## 2011-07-18 HISTORY — DX: Pure hypercholesterolemia, unspecified: E78.00

## 2011-07-18 LAB — CARDIAC PANEL(CRET KIN+CKTOT+MB+TROPI)
CK, MB: 1.8 ng/mL (ref 0.3–4.0)
Relative Index: INVALID (ref 0.0–2.5)
Total CK: 46 U/L (ref 7–232)
Troponin I: <0.3 ng/mL (ref <0.30)

## 2011-07-18 LAB — PROTIME-INR
INR: 1.74 — ABNORMAL HIGH (ref 0.00–1.49)
Prothrombin Time: 20.7 seconds — ABNORMAL HIGH (ref 11.6–15.2)

## 2011-07-18 MED ORDER — HYDROMORPHONE HCL PF 1 MG/ML IJ SOLN
1.0000 mg | Freq: Once | INTRAMUSCULAR | Status: AC
  Administered 2011-07-18: 1 mg via INTRAMUSCULAR
  Filled 2011-07-18: qty 1

## 2011-07-18 MED ORDER — OXYCODONE-ACETAMINOPHEN 5-325 MG PO TABS: 1.0000 | ORAL_TABLET | ORAL | Status: AC | PRN

## 2011-07-18 NOTE — ED Notes (Addendum)
Pt is here for evaluation of CP which began today and is in his left chest.  Pt denies any sob, n/v or diaphoresis with this.  Pt had a fall this am, fell on his knees and hit his chest and face.  Pt with deficits from previous CVA at baseline

## 2011-07-18 NOTE — ED Notes (Signed)
Pt presents to department for evaluation of L sided chest pain/rib pain after fall this morning. Pt states that he tripped and fell this morning landing on L rib cage. Now states pain has increased throughout day. Denies SOB. He is conscious alert and oriented x4. 5/10 pain at the time. No bruising to L sided rib cage, no obvious deformities noted. Able to move all extremities without difficulty. Skin warm and dry. No signs of distress noted at present.

## 2011-07-18 NOTE — ED Provider Notes (Signed)
History     CSN: [id number]  Arrival date & time 07/18/11  1555   First MD Initiated Contact with Patient 07/18/11 1918      Chief Complaint  Patient presents with  . Chest Pain     HPI The patient reports he fell this morning and onto his knees and hit the left side of his chest and face.  He has no complaints of neck pain.  He denies malocclusion or trismus.  His complaint is left-sided anterior chest pain.  He reports is worse with palpation and deep breathing.  He denies shortness of breath.  He has no fevers or chills.  He's had no productive cough.  He does also report that he fell onto his knees but has been able to walk without difficulty.  He has only very mild pain in his bilateral anterior knees.  He has a history of pulmonary embolism and is on Coumadin.  His pain is mild to moderate at this time to   Past Medical History  Diagnosis Date  . CVA (cerebral infarction)   . Hypertension   . Hypercholesteremia   . Pulmonary embolism     Past Surgical History  Procedure Date  . Total hip arthroplasty   . Leg surgery   . Hernia repair     No family history on file.  History  Substance Use Topics  . Smoking status: Not on file  . Smokeless tobacco: Not on file  . Alcohol Use: No      Review of Systems  All other systems reviewed and are negative.    Allergies  Review of patient's allergies indicates no known allergies.  Home Medications   Current Outpatient Rx  Name Route Sig Dispense Refill  . ATORVASTATIN CALCIUM 40 MG PO TABS Oral Take 40 mg by mouth daily.    Marland Kitchen CITALOPRAM HYDROBROMIDE 40 MG PO TABS Oral Take 40 mg by mouth at bedtime.    Marland Kitchen CLONAZEPAM 0.5 MG PO TABS Oral Take 0.5 mg by mouth at bedtime.    Marland Kitchen LOSARTAN POTASSIUM-HCTZ 100-25 MG PO TABS Oral Take 1 tablet by mouth daily.    Marland Kitchen MIRTAZAPINE 15 MG PO TABS Oral Take 15 mg by mouth at bedtime.    . OMEPRAZOLE 20 MG PO CPDR Oral Take 20 mg by mouth daily.    . WARFARIN SODIUM 5 MG PO TABS  Oral Take 5-10 mg by mouth daily. Take 5 MG every day except on Mondays and Fridays; on Mondays and Fridays, take 10 MG.    . OXYCODONE-ACETAMINOPHEN 5-325 MG PO TABS Oral Take 1 tablet by mouth every 4 (four) hours as needed for pain. 20 tablet 0    BP 160/83  Pulse 83  Temp(Src) 97.5 F (36.4 C) (Oral)  Resp 14  SpO2 92%  Physical Exam  Nursing note and vitals reviewed. Constitutional: He is oriented to person, place, and time. He appears well-developed and well-nourished.  HENT:  Head: Normocephalic and atraumatic.  Eyes: EOM are normal.  Neck: Normal range of motion.  Cardiovascular: Normal rate, regular rhythm, normal heart sounds and intact distal pulses.   Pulmonary/Chest: Effort normal and breath sounds normal. No respiratory distress.       Tenderness of his left anterior lateral chest wall.  No obvious bruising or crepitus noted.  Abdominal: Soft. He exhibits no distension. There is no tenderness.  Musculoskeletal: Normal range of motion.  Neurological: He is alert and oriented to person, place, and time.  Skin: Skin  is warm and dry.  Psychiatric: He has a normal mood and affect. Judgment normal.    ED Course  Procedures (including critical care time)  Labs Reviewed  PROTIME-INR - Abnormal; Notable for the following:    Prothrombin Time 20.7 (*)    INR 1.74 (*)    All other components within normal limits  CARDIAC PANEL(CRET KIN+CKTOT+MB+TROPI)   Dg Chest 2 View  07/18/2011  *RADIOLOGY REPORT*  Clinical Data: Chest pain  CHEST - 2 VIEW  Comparison: 12/17/2009  Findings: Heart size appears normal.  There is no pleural effusion or edema.  Calcified pleural plaques are noted bilaterally.  Scarring is noted within the lung bases left greater than right.  No airspace consolidation.  Review of the visualized osseous structures is unremarkable.  IMPRESSION:  1.  No acute cardiopulmonary abnormalities. 2.  Calcified pleural plaques compatible prior asbestos exposure.   Original Report Authenticated By: Rosealee Albee, M.D.   I personally reviewed the patient's chest x-ray  1. Chest pain       MDM  The patient had a traumatic injury to his left chest today.  He has no obvious rib fracture noted on chest x-ray.  His pain was treated in the ER.  He was given instructions for treatment of rib contusion.  His EKG is without ischemic changes.  This is not ACS.  This is not a pulmonary embolism.  This confusion.  Discharge home with instructions to return to the ER for new or worsening symptoms including development of shortness of breath        Lyanne Co, MD 07/20/11 0005

## 2011-07-18 NOTE — Discharge Instructions (Signed)
Chest Pain, Nonspecific  It is often hard to give a specific diagnosis for the cause of chest pain. There is always a chance that your pain could be related to something serious, like a heart attack or a blood clot in the lungs. You need to follow up with your caregiver for further evaluation. More lab tests or other studies such as X-rays, electrocardiography, stress testing, or cardiac imaging may be needed to find the cause of your pain.  Most of the time, nonspecific chest pain improves within 2 to 3 days with rest and mild pain medicine. For the next few days, avoid physical exertion or activities that bring on pain. Do not smoke. Avoid drinking alcohol. Call your caregiver for routine follow-up as advised.   SEEK IMMEDIATE MEDICAL CARE IF:   You develop increased chest pain or pain that radiates to the arm, neck, jaw, back, or abdomen.   You develop shortness of breath, increased coughing, or you start coughing up blood.   You have severe back or abdominal pain, nausea, or vomiting.   You develop severe weakness, fainting, fever, or chills.  Document Released: 03/10/2005 Document Revised: 02/27/2011 Document Reviewed: 08/28/2006  [employer] Patient Information 2012 [employer].

## 2011-07-22 DIAGNOSIS — Z7901 Long term (current) use of anticoagulants: Secondary | ICD-10-CM | POA: Diagnosis not present

## 2011-07-22 DIAGNOSIS — I824Y9 Acute embolism and thrombosis of unspecified deep veins of unspecified proximal lower extremity: Secondary | ICD-10-CM | POA: Diagnosis not present

## 2011-07-22 DIAGNOSIS — I2699 Other pulmonary embolism without acute cor pulmonale: Secondary | ICD-10-CM | POA: Diagnosis not present

## 2011-08-05 DIAGNOSIS — E7800 Pure hypercholesterolemia, unspecified: Secondary | ICD-10-CM | POA: Diagnosis not present

## 2011-08-05 DIAGNOSIS — Z79899 Other long term (current) drug therapy: Secondary | ICD-10-CM | POA: Diagnosis not present

## 2011-08-07 DIAGNOSIS — N189 Chronic kidney disease, unspecified: Secondary | ICD-10-CM | POA: Diagnosis not present

## 2011-08-07 DIAGNOSIS — I10 Essential (primary) hypertension: Secondary | ICD-10-CM | POA: Diagnosis not present

## 2011-08-07 DIAGNOSIS — M25619 Stiffness of unspecified shoulder, not elsewhere classified: Secondary | ICD-10-CM | POA: Diagnosis not present

## 2011-08-08 DIAGNOSIS — M67919 Unspecified disorder of synovium and tendon, unspecified shoulder: Secondary | ICD-10-CM | POA: Diagnosis not present

## 2011-08-08 DIAGNOSIS — M19019 Primary osteoarthritis, unspecified shoulder: Secondary | ICD-10-CM | POA: Diagnosis not present

## 2011-08-08 DIAGNOSIS — M719 Bursopathy, unspecified: Secondary | ICD-10-CM | POA: Diagnosis not present

## 2011-08-08 DIAGNOSIS — M25519 Pain in unspecified shoulder: Secondary | ICD-10-CM | POA: Diagnosis not present

## 2011-08-21 DIAGNOSIS — I6990 Unspecified sequelae of unspecified cerebrovascular disease: Secondary | ICD-10-CM | POA: Diagnosis not present

## 2011-08-21 DIAGNOSIS — I2699 Other pulmonary embolism without acute cor pulmonale: Secondary | ICD-10-CM | POA: Diagnosis not present

## 2011-08-21 DIAGNOSIS — Z7901 Long term (current) use of anticoagulants: Secondary | ICD-10-CM | POA: Diagnosis not present

## 2011-08-21 DIAGNOSIS — I824Y9 Acute embolism and thrombosis of unspecified deep veins of unspecified proximal lower extremity: Secondary | ICD-10-CM | POA: Diagnosis not present

## 2011-09-24 DIAGNOSIS — Z85828 Personal history of other malignant neoplasm of skin: Secondary | ICD-10-CM | POA: Diagnosis not present

## 2011-09-24 DIAGNOSIS — L570 Actinic keratosis: Secondary | ICD-10-CM | POA: Diagnosis not present

## 2011-09-24 DIAGNOSIS — C44221 Squamous cell carcinoma of skin of unspecified ear and external auricular canal: Secondary | ICD-10-CM | POA: Diagnosis not present

## 2011-09-26 DIAGNOSIS — I2699 Other pulmonary embolism without acute cor pulmonale: Secondary | ICD-10-CM | POA: Diagnosis not present

## 2011-09-26 DIAGNOSIS — I824Y9 Acute embolism and thrombosis of unspecified deep veins of unspecified proximal lower extremity: Secondary | ICD-10-CM | POA: Diagnosis not present

## 2011-09-26 DIAGNOSIS — I6990 Unspecified sequelae of unspecified cerebrovascular disease: Secondary | ICD-10-CM | POA: Diagnosis not present

## 2011-09-26 DIAGNOSIS — Z7901 Long term (current) use of anticoagulants: Secondary | ICD-10-CM | POA: Diagnosis not present

## 2011-10-10 DIAGNOSIS — I824Y9 Acute embolism and thrombosis of unspecified deep veins of unspecified proximal lower extremity: Secondary | ICD-10-CM | POA: Diagnosis not present

## 2011-10-10 DIAGNOSIS — Z7901 Long term (current) use of anticoagulants: Secondary | ICD-10-CM | POA: Diagnosis not present

## 2011-10-10 DIAGNOSIS — I2699 Other pulmonary embolism without acute cor pulmonale: Secondary | ICD-10-CM | POA: Diagnosis not present

## 2011-10-10 DIAGNOSIS — I6990 Unspecified sequelae of unspecified cerebrovascular disease: Secondary | ICD-10-CM | POA: Diagnosis not present

## 2011-10-14 DIAGNOSIS — C44221 Squamous cell carcinoma of skin of unspecified ear and external auricular canal: Secondary | ICD-10-CM | POA: Diagnosis not present

## 2011-10-24 DIAGNOSIS — C44221 Squamous cell carcinoma of skin of unspecified ear and external auricular canal: Secondary | ICD-10-CM | POA: Diagnosis not present

## 2011-11-12 DIAGNOSIS — Z0000 Encounter for general adult medical examination without abnormal findings: Secondary | ICD-10-CM | POA: Diagnosis not present

## 2011-11-12 DIAGNOSIS — Z9181 History of falling: Secondary | ICD-10-CM | POA: Diagnosis not present

## 2011-11-12 DIAGNOSIS — Z789 Other specified health status: Secondary | ICD-10-CM | POA: Diagnosis not present

## 2011-11-12 DIAGNOSIS — Z043 Encounter for examination and observation following other accident: Secondary | ICD-10-CM | POA: Diagnosis not present

## 2011-11-13 DIAGNOSIS — I6990 Unspecified sequelae of unspecified cerebrovascular disease: Secondary | ICD-10-CM | POA: Diagnosis not present

## 2011-11-13 DIAGNOSIS — Z7901 Long term (current) use of anticoagulants: Secondary | ICD-10-CM | POA: Diagnosis not present

## 2011-11-13 DIAGNOSIS — I2699 Other pulmonary embolism without acute cor pulmonale: Secondary | ICD-10-CM | POA: Diagnosis not present

## 2011-11-13 DIAGNOSIS — I824Y9 Acute embolism and thrombosis of unspecified deep veins of unspecified proximal lower extremity: Secondary | ICD-10-CM | POA: Diagnosis not present

## 2011-11-19 DIAGNOSIS — Z85828 Personal history of other malignant neoplasm of skin: Secondary | ICD-10-CM | POA: Diagnosis not present

## 2011-12-03 DIAGNOSIS — N390 Urinary tract infection, site not specified: Secondary | ICD-10-CM | POA: Diagnosis not present

## 2011-12-15 DIAGNOSIS — Z7901 Long term (current) use of anticoagulants: Secondary | ICD-10-CM | POA: Diagnosis not present

## 2011-12-15 DIAGNOSIS — I6990 Unspecified sequelae of unspecified cerebrovascular disease: Secondary | ICD-10-CM | POA: Diagnosis not present

## 2011-12-15 DIAGNOSIS — I2699 Other pulmonary embolism without acute cor pulmonale: Secondary | ICD-10-CM | POA: Diagnosis not present

## 2011-12-15 DIAGNOSIS — I824Y9 Acute embolism and thrombosis of unspecified deep veins of unspecified proximal lower extremity: Secondary | ICD-10-CM | POA: Diagnosis not present

## 2012-01-15 DIAGNOSIS — Z7901 Long term (current) use of anticoagulants: Secondary | ICD-10-CM | POA: Diagnosis not present

## 2012-01-15 DIAGNOSIS — I824Y9 Acute embolism and thrombosis of unspecified deep veins of unspecified proximal lower extremity: Secondary | ICD-10-CM | POA: Diagnosis not present

## 2012-01-15 DIAGNOSIS — I6990 Unspecified sequelae of unspecified cerebrovascular disease: Secondary | ICD-10-CM | POA: Diagnosis not present

## 2012-01-15 DIAGNOSIS — I2699 Other pulmonary embolism without acute cor pulmonale: Secondary | ICD-10-CM | POA: Diagnosis not present

## 2012-01-15 DIAGNOSIS — Z23 Encounter for immunization: Secondary | ICD-10-CM | POA: Diagnosis not present

## 2012-02-06 DIAGNOSIS — I6990 Unspecified sequelae of unspecified cerebrovascular disease: Secondary | ICD-10-CM | POA: Diagnosis not present

## 2012-02-06 DIAGNOSIS — Z125 Encounter for screening for malignant neoplasm of prostate: Secondary | ICD-10-CM | POA: Diagnosis not present

## 2012-02-06 DIAGNOSIS — Z79899 Other long term (current) drug therapy: Secondary | ICD-10-CM | POA: Diagnosis not present

## 2012-02-06 DIAGNOSIS — E7800 Pure hypercholesterolemia, unspecified: Secondary | ICD-10-CM | POA: Diagnosis not present

## 2012-02-10 DIAGNOSIS — I2699 Other pulmonary embolism without acute cor pulmonale: Secondary | ICD-10-CM | POA: Diagnosis not present

## 2012-02-10 DIAGNOSIS — I10 Essential (primary) hypertension: Secondary | ICD-10-CM | POA: Diagnosis not present

## 2012-02-10 DIAGNOSIS — Z7901 Long term (current) use of anticoagulants: Secondary | ICD-10-CM | POA: Diagnosis not present

## 2012-02-10 DIAGNOSIS — Z0000 Encounter for general adult medical examination without abnormal findings: Secondary | ICD-10-CM | POA: Diagnosis not present

## 2012-02-10 DIAGNOSIS — L570 Actinic keratosis: Secondary | ICD-10-CM | POA: Diagnosis not present

## 2012-02-10 DIAGNOSIS — Z1212 Encounter for screening for malignant neoplasm of rectum: Secondary | ICD-10-CM | POA: Diagnosis not present

## 2012-02-10 DIAGNOSIS — I824Y9 Acute embolism and thrombosis of unspecified deep veins of unspecified proximal lower extremity: Secondary | ICD-10-CM | POA: Diagnosis not present

## 2012-02-12 DIAGNOSIS — I2699 Other pulmonary embolism without acute cor pulmonale: Secondary | ICD-10-CM | POA: Diagnosis not present

## 2012-02-12 DIAGNOSIS — Z7901 Long term (current) use of anticoagulants: Secondary | ICD-10-CM | POA: Diagnosis not present

## 2012-02-12 DIAGNOSIS — I824Y9 Acute embolism and thrombosis of unspecified deep veins of unspecified proximal lower extremity: Secondary | ICD-10-CM | POA: Diagnosis not present

## 2012-02-12 DIAGNOSIS — I6990 Unspecified sequelae of unspecified cerebrovascular disease: Secondary | ICD-10-CM | POA: Diagnosis not present

## 2012-02-13 DIAGNOSIS — E538 Deficiency of other specified B group vitamins: Secondary | ICD-10-CM | POA: Diagnosis not present

## 2012-03-12 DIAGNOSIS — H2589 Other age-related cataract: Secondary | ICD-10-CM | POA: Diagnosis not present

## 2012-04-01 DIAGNOSIS — Z7901 Long term (current) use of anticoagulants: Secondary | ICD-10-CM | POA: Diagnosis not present

## 2012-04-01 DIAGNOSIS — I824Y9 Acute embolism and thrombosis of unspecified deep veins of unspecified proximal lower extremity: Secondary | ICD-10-CM | POA: Diagnosis not present

## 2012-04-01 DIAGNOSIS — I2699 Other pulmonary embolism without acute cor pulmonale: Secondary | ICD-10-CM | POA: Diagnosis not present

## 2012-04-01 DIAGNOSIS — I6990 Unspecified sequelae of unspecified cerebrovascular disease: Secondary | ICD-10-CM | POA: Diagnosis not present

## 2012-05-03 DIAGNOSIS — I6990 Unspecified sequelae of unspecified cerebrovascular disease: Secondary | ICD-10-CM | POA: Diagnosis not present

## 2012-05-03 DIAGNOSIS — Z7901 Long term (current) use of anticoagulants: Secondary | ICD-10-CM | POA: Diagnosis not present

## 2012-05-03 DIAGNOSIS — I824Y9 Acute embolism and thrombosis of unspecified deep veins of unspecified proximal lower extremity: Secondary | ICD-10-CM | POA: Diagnosis not present

## 2012-05-03 DIAGNOSIS — I2699 Other pulmonary embolism without acute cor pulmonale: Secondary | ICD-10-CM | POA: Diagnosis not present

## 2012-05-31 DIAGNOSIS — I824Y9 Acute embolism and thrombosis of unspecified deep veins of unspecified proximal lower extremity: Secondary | ICD-10-CM | POA: Diagnosis not present

## 2012-05-31 DIAGNOSIS — I2699 Other pulmonary embolism without acute cor pulmonale: Secondary | ICD-10-CM | POA: Diagnosis not present

## 2012-05-31 DIAGNOSIS — Z7901 Long term (current) use of anticoagulants: Secondary | ICD-10-CM | POA: Diagnosis not present

## 2012-05-31 DIAGNOSIS — I6990 Unspecified sequelae of unspecified cerebrovascular disease: Secondary | ICD-10-CM | POA: Diagnosis not present

## 2012-07-01 DIAGNOSIS — I2699 Other pulmonary embolism without acute cor pulmonale: Secondary | ICD-10-CM | POA: Diagnosis not present

## 2012-07-01 DIAGNOSIS — I6990 Unspecified sequelae of unspecified cerebrovascular disease: Secondary | ICD-10-CM | POA: Diagnosis not present

## 2012-07-01 DIAGNOSIS — Z7901 Long term (current) use of anticoagulants: Secondary | ICD-10-CM | POA: Diagnosis not present

## 2012-07-01 DIAGNOSIS — I824Y9 Acute embolism and thrombosis of unspecified deep veins of unspecified proximal lower extremity: Secondary | ICD-10-CM | POA: Diagnosis not present

## 2012-07-08 DIAGNOSIS — M7989 Other specified soft tissue disorders: Secondary | ICD-10-CM | POA: Diagnosis not present

## 2012-07-08 DIAGNOSIS — M25579 Pain in unspecified ankle and joints of unspecified foot: Secondary | ICD-10-CM | POA: Diagnosis not present

## 2012-07-08 DIAGNOSIS — M2550 Pain in unspecified joint: Secondary | ICD-10-CM | POA: Diagnosis not present

## 2012-07-08 DIAGNOSIS — M19079 Primary osteoarthritis, unspecified ankle and foot: Secondary | ICD-10-CM | POA: Diagnosis not present

## 2012-07-19 DIAGNOSIS — I824Y9 Acute embolism and thrombosis of unspecified deep veins of unspecified proximal lower extremity: Secondary | ICD-10-CM | POA: Diagnosis not present

## 2012-07-19 DIAGNOSIS — I6990 Unspecified sequelae of unspecified cerebrovascular disease: Secondary | ICD-10-CM | POA: Diagnosis not present

## 2012-07-19 DIAGNOSIS — I2699 Other pulmonary embolism without acute cor pulmonale: Secondary | ICD-10-CM | POA: Diagnosis not present

## 2012-07-19 DIAGNOSIS — Z7901 Long term (current) use of anticoagulants: Secondary | ICD-10-CM | POA: Diagnosis not present

## 2012-07-22 DIAGNOSIS — M109 Gout, unspecified: Secondary | ICD-10-CM | POA: Diagnosis not present

## 2012-07-26 DIAGNOSIS — M109 Gout, unspecified: Secondary | ICD-10-CM | POA: Diagnosis not present

## 2012-08-03 DIAGNOSIS — I2699 Other pulmonary embolism without acute cor pulmonale: Secondary | ICD-10-CM | POA: Diagnosis not present

## 2012-08-03 DIAGNOSIS — Z7901 Long term (current) use of anticoagulants: Secondary | ICD-10-CM | POA: Diagnosis not present

## 2012-08-03 DIAGNOSIS — I6990 Unspecified sequelae of unspecified cerebrovascular disease: Secondary | ICD-10-CM | POA: Diagnosis not present

## 2012-08-03 DIAGNOSIS — I824Y9 Acute embolism and thrombosis of unspecified deep veins of unspecified proximal lower extremity: Secondary | ICD-10-CM | POA: Diagnosis not present

## 2012-08-17 DIAGNOSIS — I824Y9 Acute embolism and thrombosis of unspecified deep veins of unspecified proximal lower extremity: Secondary | ICD-10-CM | POA: Diagnosis not present

## 2012-08-17 DIAGNOSIS — M109 Gout, unspecified: Secondary | ICD-10-CM | POA: Diagnosis not present

## 2012-08-17 DIAGNOSIS — Z7901 Long term (current) use of anticoagulants: Secondary | ICD-10-CM | POA: Diagnosis not present

## 2012-08-17 DIAGNOSIS — I2699 Other pulmonary embolism without acute cor pulmonale: Secondary | ICD-10-CM | POA: Diagnosis not present

## 2012-08-20 DIAGNOSIS — M79609 Pain in unspecified limb: Secondary | ICD-10-CM | POA: Diagnosis not present

## 2012-08-20 DIAGNOSIS — M109 Gout, unspecified: Secondary | ICD-10-CM | POA: Diagnosis not present

## 2012-09-01 DIAGNOSIS — Z7901 Long term (current) use of anticoagulants: Secondary | ICD-10-CM | POA: Diagnosis not present

## 2012-09-01 DIAGNOSIS — I824Y9 Acute embolism and thrombosis of unspecified deep veins of unspecified proximal lower extremity: Secondary | ICD-10-CM | POA: Diagnosis not present

## 2012-09-01 DIAGNOSIS — I6990 Unspecified sequelae of unspecified cerebrovascular disease: Secondary | ICD-10-CM | POA: Diagnosis not present

## 2012-09-01 DIAGNOSIS — I2699 Other pulmonary embolism without acute cor pulmonale: Secondary | ICD-10-CM | POA: Diagnosis not present

## 2012-09-06 DIAGNOSIS — M109 Gout, unspecified: Secondary | ICD-10-CM | POA: Diagnosis not present

## 2012-10-04 DIAGNOSIS — I824Y9 Acute embolism and thrombosis of unspecified deep veins of unspecified proximal lower extremity: Secondary | ICD-10-CM | POA: Diagnosis not present

## 2012-10-04 DIAGNOSIS — Z7901 Long term (current) use of anticoagulants: Secondary | ICD-10-CM | POA: Diagnosis not present

## 2012-10-04 DIAGNOSIS — M109 Gout, unspecified: Secondary | ICD-10-CM | POA: Diagnosis not present

## 2012-10-04 DIAGNOSIS — Z006 Encounter for examination for normal comparison and control in clinical research program: Secondary | ICD-10-CM | POA: Diagnosis not present

## 2012-10-04 DIAGNOSIS — I2699 Other pulmonary embolism without acute cor pulmonale: Secondary | ICD-10-CM | POA: Diagnosis not present

## 2012-10-04 DIAGNOSIS — I6990 Unspecified sequelae of unspecified cerebrovascular disease: Secondary | ICD-10-CM | POA: Diagnosis not present

## 2012-10-04 DIAGNOSIS — N189 Chronic kidney disease, unspecified: Secondary | ICD-10-CM | POA: Diagnosis not present

## 2012-11-01 DIAGNOSIS — I2699 Other pulmonary embolism without acute cor pulmonale: Secondary | ICD-10-CM | POA: Diagnosis not present

## 2012-11-01 DIAGNOSIS — I824Y9 Acute embolism and thrombosis of unspecified deep veins of unspecified proximal lower extremity: Secondary | ICD-10-CM | POA: Diagnosis not present

## 2012-11-01 DIAGNOSIS — Z7901 Long term (current) use of anticoagulants: Secondary | ICD-10-CM | POA: Diagnosis not present

## 2012-11-01 DIAGNOSIS — I6990 Unspecified sequelae of unspecified cerebrovascular disease: Secondary | ICD-10-CM | POA: Diagnosis not present

## 2012-12-02 DIAGNOSIS — I824Y9 Acute embolism and thrombosis of unspecified deep veins of unspecified proximal lower extremity: Secondary | ICD-10-CM | POA: Diagnosis not present

## 2012-12-02 DIAGNOSIS — Z7901 Long term (current) use of anticoagulants: Secondary | ICD-10-CM | POA: Diagnosis not present

## 2012-12-02 DIAGNOSIS — I2699 Other pulmonary embolism without acute cor pulmonale: Secondary | ICD-10-CM | POA: Diagnosis not present

## 2012-12-28 DIAGNOSIS — I824Y9 Acute embolism and thrombosis of unspecified deep veins of unspecified proximal lower extremity: Secondary | ICD-10-CM | POA: Diagnosis not present

## 2012-12-28 DIAGNOSIS — K219 Gastro-esophageal reflux disease without esophagitis: Secondary | ICD-10-CM | POA: Diagnosis not present

## 2012-12-28 DIAGNOSIS — Z7901 Long term (current) use of anticoagulants: Secondary | ICD-10-CM | POA: Diagnosis not present

## 2012-12-28 DIAGNOSIS — Z23 Encounter for immunization: Secondary | ICD-10-CM | POA: Diagnosis not present

## 2012-12-28 DIAGNOSIS — I2699 Other pulmonary embolism without acute cor pulmonale: Secondary | ICD-10-CM | POA: Diagnosis not present

## 2013-01-11 DIAGNOSIS — I2699 Other pulmonary embolism without acute cor pulmonale: Secondary | ICD-10-CM | POA: Diagnosis not present

## 2013-01-11 DIAGNOSIS — I824Y9 Acute embolism and thrombosis of unspecified deep veins of unspecified proximal lower extremity: Secondary | ICD-10-CM | POA: Diagnosis not present

## 2013-01-11 DIAGNOSIS — Z7901 Long term (current) use of anticoagulants: Secondary | ICD-10-CM | POA: Diagnosis not present

## 2013-01-11 DIAGNOSIS — I6990 Unspecified sequelae of unspecified cerebrovascular disease: Secondary | ICD-10-CM | POA: Diagnosis not present

## 2013-01-12 ENCOUNTER — Emergency Department (HOSPITAL_COMMUNITY): Payer: Medicare Other

## 2013-01-12 ENCOUNTER — Encounter (HOSPITAL_COMMUNITY): Payer: Self-pay | Admitting: Emergency Medicine

## 2013-01-12 ENCOUNTER — Emergency Department (HOSPITAL_COMMUNITY)
Admission: EM | Admit: 2013-01-12 | Discharge: 2013-01-12 | Disposition: A | Discharge status: Home / Self Care | Payer: Medicare Other | Attending: Emergency Medicine | Admitting: Emergency Medicine

## 2013-01-12 DIAGNOSIS — S0003XA Contusion of scalp, initial encounter: Secondary | ICD-10-CM | POA: Diagnosis not present

## 2013-01-12 DIAGNOSIS — Z86711 Personal history of pulmonary embolism: Secondary | ICD-10-CM | POA: Diagnosis not present

## 2013-01-12 DIAGNOSIS — S99919A Unspecified injury of unspecified ankle, initial encounter: Secondary | ICD-10-CM | POA: Diagnosis not present

## 2013-01-12 DIAGNOSIS — S59919A Unspecified injury of unspecified forearm, initial encounter: Secondary | ICD-10-CM | POA: Insufficient documentation

## 2013-01-12 DIAGNOSIS — Z79899 Other long term (current) drug therapy: Secondary | ICD-10-CM | POA: Diagnosis not present

## 2013-01-12 DIAGNOSIS — I10 Essential (primary) hypertension: Secondary | ICD-10-CM | POA: Diagnosis not present

## 2013-01-12 DIAGNOSIS — S8990XA Unspecified injury of unspecified lower leg, initial encounter: Secondary | ICD-10-CM | POA: Insufficient documentation

## 2013-01-12 DIAGNOSIS — Z7901 Long term (current) use of anticoagulants: Secondary | ICD-10-CM | POA: Insufficient documentation

## 2013-01-12 DIAGNOSIS — S59909A Unspecified injury of unspecified elbow, initial encounter: Secondary | ICD-10-CM | POA: Diagnosis not present

## 2013-01-12 DIAGNOSIS — S6990XA Unspecified injury of unspecified wrist, hand and finger(s), initial encounter: Secondary | ICD-10-CM | POA: Insufficient documentation

## 2013-01-12 DIAGNOSIS — Y92009 Unspecified place in unspecified non-institutional (private) residence as the place of occurrence of the external cause: Secondary | ICD-10-CM | POA: Insufficient documentation

## 2013-01-12 DIAGNOSIS — W010XXA Fall on same level from slipping, tripping and stumbling without subsequent striking against object, initial encounter: Secondary | ICD-10-CM | POA: Insufficient documentation

## 2013-01-12 DIAGNOSIS — Y9301 Activity, walking, marching and hiking: Secondary | ICD-10-CM | POA: Insufficient documentation

## 2013-01-12 DIAGNOSIS — Z9181 History of falling: Secondary | ICD-10-CM | POA: Diagnosis not present

## 2013-01-12 DIAGNOSIS — M25539 Pain in unspecified wrist: Secondary | ICD-10-CM | POA: Diagnosis not present

## 2013-01-12 DIAGNOSIS — Z8673 Personal history of transient ischemic attack (TIA), and cerebral infarction without residual deficits: Secondary | ICD-10-CM | POA: Insufficient documentation

## 2013-01-12 DIAGNOSIS — E7800 Pure hypercholesterolemia, unspecified: Secondary | ICD-10-CM | POA: Diagnosis not present

## 2013-01-12 DIAGNOSIS — M25569 Pain in unspecified knee: Secondary | ICD-10-CM | POA: Diagnosis not present

## 2013-01-12 DIAGNOSIS — S0990XA Unspecified injury of head, initial encounter: Secondary | ICD-10-CM | POA: Insufficient documentation

## 2013-01-12 DIAGNOSIS — R51 Headache: Secondary | ICD-10-CM | POA: Diagnosis not present

## 2013-01-12 DIAGNOSIS — S298XXA Other specified injuries of thorax, initial encounter: Secondary | ICD-10-CM | POA: Diagnosis not present

## 2013-01-12 DIAGNOSIS — Z87820 Personal history of traumatic brain injury: Secondary | ICD-10-CM | POA: Diagnosis not present

## 2013-01-12 DIAGNOSIS — IMO0002 Reserved for concepts with insufficient information to code with codable children: Secondary | ICD-10-CM | POA: Diagnosis not present

## 2013-01-12 DIAGNOSIS — S0083XA Contusion of other part of head, initial encounter: Secondary | ICD-10-CM | POA: Insufficient documentation

## 2013-01-12 DIAGNOSIS — W19XXXA Unspecified fall, initial encounter: Secondary | ICD-10-CM

## 2013-01-12 LAB — COMPREHENSIVE METABOLIC PANEL
AST: 40 U/L — ABNORMAL HIGH (ref 0–37)
Albumin: 3.8 g/dL (ref 3.5–5.2)
Calcium: 9.3 mg/dL (ref 8.4–10.5)
Creatinine, Ser: 1.41 mg/dL — ABNORMAL HIGH (ref 0.50–1.35)
GFR calc non Af Amer: 48 mL/min — ABNORMAL LOW (ref >90)
Total Protein: 7.3 g/dL (ref 6.0–8.3)

## 2013-01-12 LAB — CBC WITH DIFFERENTIAL/PLATELET
Basophils Absolute: 0 10*3/uL (ref 0.0–0.1)
Basophils Relative: 0 % (ref 0–1)
Eosinophils Relative: 2 % (ref 0–5)
HCT: 43.1 % (ref 39.0–52.0)
MCHC: 36 g/dL (ref 30.0–36.0)
MCV: 93.5 fL (ref 78.0–100.0)
Monocytes Absolute: 1 10*3/uL (ref 0.1–1.0)
Platelets: 252 10*3/uL (ref 150–400)
RDW: 13.5 % (ref 11.5–15.5)

## 2013-01-12 LAB — PROTIME-INR: INR: 2.03 — ABNORMAL HIGH (ref 0.00–1.49)

## 2013-01-12 MED ORDER — SODIUM CHLORIDE 0.9 % IV SOLN
Freq: Once | INTRAVENOUS | Status: AC
  Administered 2013-01-12: 1000 mL via INTRAVENOUS

## 2013-01-12 NOTE — ED Notes (Addendum)
Pt in s/p fall, states he tripped and hit his head, denies LOC, pt takes coumadin, c/o headache at this time, pt speech noted to be slurred and wife states this is normal for him, pt alert and oriented, no neuro deficits noted

## 2013-01-12 NOTE — ED Notes (Signed)
Pt belongings placed in belongings bag. Items include plaid shirt pair of jeans black shoes black socks and a straw hat. pts wife has pts wallet and pts cell phone.

## 2013-01-12 NOTE — ED Notes (Signed)
Pt returned from CT/Xray. 

## 2013-01-12 NOTE — ED Provider Notes (Signed)
CSN: [id number]     Arrival date & time 01/12/13  1334 History   First MD Initiated Contact with Patient 01/12/13 1506     Chief Complaint  Patient presents with  . Fall   (Consider location/radiation/quality/duration/timing/severity/associated sxs/prior Treatment) HPI Comments: The patient is a 72 year old male with a past medical history of CVA, hemorraghic brain injury Currently on coumadin presents today with an unwitnessed fall.  He reports the fall occurred at home and remembers tripping on step while walking to his front door.  He reports hitting his head on the frame of the storm door, denies breaking the glass and his spouse confirms.  Denies LOC of blacking out.  He states he was able to break the fall with his right hand.  Complains of Right wrist pain and R knee pain. The patient's wife reports the patient is at his baseline since previous stroke and brain injury.  Spouse reports multiple falls when patient does not use his walker. No nausea, vomiting, diarrhea, constipation, chest pain, dyspnea, laceration, urinary symptoms.   Patient is a 72 y.o. male presenting with fall. The history is provided by the patient and the spouse.  Fall    Past Medical History  Diagnosis Date  . CVA (cerebral infarction)   . Hypertension   . Hypercholesteremia   . Pulmonary embolism    Past Surgical History  Procedure Laterality Date  . Total hip arthroplasty    . Leg surgery    . Hernia repair     History reviewed. No pertinent family history. History  Substance Use Topics  . Smoking status: Not on file  . Smokeless tobacco: Not on file  . Alcohol Use: No    Review of Systems  Allergies  Review of patient's allergies indicates no known allergies.  Home Medications   Current Outpatient Rx  Name  Route  Sig  Dispense  Refill  . atorvastatin (LIPITOR) 40 MG tablet   Oral   Take 40 mg by mouth daily.         . citalopram (CELEXA) 40 MG tablet   Oral   Take 40 mg by mouth  at bedtime.         . clonazePAM (KLONOPIN) 0.5 MG tablet   Oral   Take 0.5 mg by mouth at bedtime.         . Cyanocobalamin (VITAMIN B-12 PO)   Oral   Take 1 tablet by mouth daily.         . fish oil-omega-3 fatty acids 1000 MG capsule   Oral   Take 1 g by mouth daily.         Marland Kitchen losartan-hydrochlorothiazide (HYZAAR) 100-25 MG per tablet   Oral   Take 1 tablet by mouth daily.         . mirtazapine (REMERON) 15 MG tablet   Oral   Take 15 mg by mouth at bedtime.         . Multiple Vitamin (MULTIVITAMIN WITH MINERALS) TABS tablet   Oral   Take 1 tablet by mouth daily.         Marland Kitchen omeprazole (PRILOSEC) 20 MG capsule   Oral   Take 20 mg by mouth daily.         Marland Kitchen warfarin (COUMADIN) 5 MG tablet   Oral   Take 5-7.5 mg by mouth every evening. Take 1.5 tablets on Tuesday and Thursday and take 1 tablet daily the rest of the days.  BP 137/68  Pulse 76  Temp(Src) 98 F (36.7 C) (Oral)  Resp 16  Wt 200 lb (90.719 kg)  SpO2 93% Physical Exam  Vitals reviewed. Constitutional: He is oriented to person, place, and time. He appears well-developed and well-nourished. No distress.  HENT:  Head: Normocephalic. Head is with contusion. Head is without raccoon's eyes and without laceration.    Mouth/Throat: No oropharyngeal exudate.  Small swelling noted above Right eyebrow. No ecchymosis   Eyes: EOM are normal.  Neck: Neck supple.  Cardiovascular: Normal rate and regular rhythm.   Pulmonary/Chest: Effort normal and breath sounds normal. No accessory muscle usage. No respiratory distress. He has no wheezes. He exhibits no bony tenderness.  Abdominal: Soft. Bowel sounds are normal. He exhibits no distension. There is no tenderness. There is no rebound and no guarding.  Musculoskeletal: Normal range of motion.       Right knee: He exhibits swelling. He exhibits normal range of motion, no ecchymosis and no laceration.       Legs: Per patient abrasion on Left  knee is from a previous fall in July.  Neurological: He is alert and oriented to person, place, and time.  Minimal Left sided facial droop.  Mild expressive aphasia.   Skin: Skin is warm and dry. He is not diaphoretic.  Psychiatric: He has a normal mood and affect.    ED Course  Procedures (including critical care time) Labs Review Labs Reviewed  PROTIME-INR - Abnormal; Notable for the following:    Prothrombin Time 22.3 (*)    INR 2.03 (*)    All other components within normal limits  CBC WITH DIFFERENTIAL - Abnormal; Notable for the following:    WBC 11.9 (*)    Neutro Abs 8.0 (*)    All other components within normal limits  COMPREHENSIVE METABOLIC PANEL - Abnormal; Notable for the following:    Potassium 3.3 (*)    Creatinine, Ser 1.41 (*)    AST 40 (*)    GFR calc non Af Amer 48 (*)    GFR calc Af Amer 56 (*)    All other components within normal limits   Imaging Review Dg Chest 2 View  01/12/2013   CLINICAL DATA:  Fall.  EXAM: CHEST  2 VIEW  COMPARISON:  None.  FINDINGS: Scarring noted in the lung bases. There are bilateral calcified pleural plaques along the hemidiaphragms. Heart is normal size. No acute opacities or effusions. No acute bony abnormality.  IMPRESSION: Bibasilar scarring.  Bilateral calcified pleural plaques.   Electronically Signed   By: Charlett Nose M.D.   On: 01/12/2013 17:15   Dg Wrist Complete Right  01/12/2013   CLINICAL DATA:  Wrist pain the scaphoid area  EXAM: RIGHT WRIST - COMPLETE 3+ VIEW  COMPARISON:  None.  FINDINGS: There is no evidence of fracture or dislocation. There is no evidence of arthropathy or other focal bone abnormality. Soft tissues are unremarkable. Degenerate change at the distal radial ulnar joint.  IMPRESSION: No acute osseous abnormality.   Electronically Signed   By: Genevive Bi M.D.   On: 01/12/2013 17:08   Ct Head Wo Contrast  01/12/2013   CLINICAL DATA:  Pain post trauma  EXAM: CT HEAD WITHOUT CONTRAST  TECHNIQUE:  Contiguous axial images were obtained from the base of the skull through the vertex without intravenous contrast.  COMPARISON:  Report of prior study January 03, 2002 available; actual images from that study not available.  FINDINGS: There is moderate diffuse  atrophy. There are small chronic hygromas in both frontal regions without mass effect. There is no mass, acute hemorrhage, extra-axial fluid beyond the small chronic appearing hygromas as described, or midline shift.  There is small vessel disease in the centra semiovale bilaterally. There is a small lacunar infarct in the mid left thalamus. There are small lacunar infarcts in both external capsules as well as in the left globus pallidum. There is small vessel disease in the pons bilaterally in the basilar perforator distribution. There is no acute appearing infarct on this study. The  The bony calvarium appears intact. The mastoid air cells are clear.  IMPRESSION: Atrophy with small vessel disease. Several prior small lacunar infarcts. Small symmetric subdural hygromas in both frontal regions without appreciable mass effect.  There is no demonstrable acute hemorrhage, mass, or acute appearing infarct.   Electronically Signed   By: Bretta Bang M.D.   On: 01/12/2013 16:30   Dg Knee Complete 4 Views Right  01/12/2013   CLINICAL DATA:  Fall, right knee pain.  EXAM: RIGHT KNEE - COMPLETE 4+ VIEW  COMPARISON:  None.  FINDINGS: There is no evidence of fracture, dislocation, or joint effusion. There is no evidence of arthropathy or other focal bone abnormality. Soft tissues are unremarkable.  IMPRESSION: Negative.   Electronically Signed   By: Charlett Nose M.D.   On: 01/12/2013 17:14    EKG Interpretation   None       MDM   1. Fall at home, initial encounter     Patient with a past medical history of trumatic brain in [place] and CVA currently taking Warfrin, reports  INR check yesterday unsure levels, will order Head CT and R UE xray to evaluate  for fracture.   1548 Patient oxygen saturation 90% during evaluation, will order Chest XR to evaluate for pneumonia and placed him on 2L of oxygen [place].  Potasium 3.3, Cr 1.14. 1720 re-eval patient resting comfortably in bed. Discussed negative CT and XR and encouraged patient to urinate.  Sat 95%   Discussed patient condition with Dr. Jeraldine Loots, he evaluated the patient and does not think the UA is necessary.  Patient stable for discharge.  Encouraged patient to use walker.  Meds given in ED:  Medications  0.9 %  sodium chloride infusion ( Intravenous Stopped 01/12/13 1843)    Discharge Medication List as of 01/12/2013  6:17 PM        Leotis Shames Doretha Imus, PA-C 01/14/13 1410

## 2013-01-14 NOTE — ED Provider Notes (Signed)
  This was a shared visit with a mid-level provided (NP or PA).  Throughout the patient's course I was available for consultation/collaboration.  I saw the ECG (if appropriate), relevant labs and studies - I agree with the interpretation.  On my exam the patient was in no distress.  However, he had evidence of recent fall.  He was awake and alert, appropriately neurological.  With reassuring evaluation, but was concerned given his warfarin use, had an extensive discussion with him and his wife about return precautions, follow up instructions.  He had no significant ongoing complaints, was discharged in stable condition.      Gerhard Munch, MD 01/14/13 [phone]

## 2013-02-04 DIAGNOSIS — E7800 Pure hypercholesterolemia, unspecified: Secondary | ICD-10-CM | POA: Diagnosis not present

## 2013-02-04 DIAGNOSIS — Z125 Encounter for screening for malignant neoplasm of prostate: Secondary | ICD-10-CM | POA: Diagnosis not present

## 2013-02-04 DIAGNOSIS — K219 Gastro-esophageal reflux disease without esophagitis: Secondary | ICD-10-CM | POA: Diagnosis not present

## 2013-02-04 DIAGNOSIS — I10 Essential (primary) hypertension: Secondary | ICD-10-CM | POA: Diagnosis not present

## 2013-02-04 DIAGNOSIS — Z0000 Encounter for general adult medical examination without abnormal findings: Secondary | ICD-10-CM | POA: Diagnosis not present

## 2013-02-08 DIAGNOSIS — I2699 Other pulmonary embolism without acute cor pulmonale: Secondary | ICD-10-CM | POA: Diagnosis not present

## 2013-02-08 DIAGNOSIS — Z7901 Long term (current) use of anticoagulants: Secondary | ICD-10-CM | POA: Diagnosis not present

## 2013-02-08 DIAGNOSIS — I824Y9 Acute embolism and thrombosis of unspecified deep veins of unspecified proximal lower extremity: Secondary | ICD-10-CM | POA: Diagnosis not present

## 2013-02-10 DIAGNOSIS — I10 Essential (primary) hypertension: Secondary | ICD-10-CM | POA: Diagnosis not present

## 2013-02-10 DIAGNOSIS — I2699 Other pulmonary embolism without acute cor pulmonale: Secondary | ICD-10-CM | POA: Diagnosis not present

## 2013-02-10 DIAGNOSIS — Z1212 Encounter for screening for malignant neoplasm of rectum: Secondary | ICD-10-CM | POA: Diagnosis not present

## 2013-02-10 DIAGNOSIS — Z7901 Long term (current) use of anticoagulants: Secondary | ICD-10-CM | POA: Diagnosis not present

## 2013-02-10 DIAGNOSIS — L570 Actinic keratosis: Secondary | ICD-10-CM | POA: Diagnosis not present

## 2013-02-10 DIAGNOSIS — I824Y9 Acute embolism and thrombosis of unspecified deep veins of unspecified proximal lower extremity: Secondary | ICD-10-CM | POA: Diagnosis not present

## 2013-02-22 DIAGNOSIS — I824Y9 Acute embolism and thrombosis of unspecified deep veins of unspecified proximal lower extremity: Secondary | ICD-10-CM | POA: Diagnosis not present

## 2013-02-22 DIAGNOSIS — Z7901 Long term (current) use of anticoagulants: Secondary | ICD-10-CM | POA: Diagnosis not present

## 2013-02-22 DIAGNOSIS — I2699 Other pulmonary embolism without acute cor pulmonale: Secondary | ICD-10-CM | POA: Diagnosis not present

## 2013-02-23 DIAGNOSIS — I10 Essential (primary) hypertension: Secondary | ICD-10-CM | POA: Diagnosis not present

## 2013-03-23 DIAGNOSIS — I824Y9 Acute embolism and thrombosis of unspecified deep veins of unspecified proximal lower extremity: Secondary | ICD-10-CM | POA: Diagnosis not present

## 2013-03-23 DIAGNOSIS — I6990 Unspecified sequelae of unspecified cerebrovascular disease: Secondary | ICD-10-CM | POA: Diagnosis not present

## 2013-03-23 DIAGNOSIS — Z7901 Long term (current) use of anticoagulants: Secondary | ICD-10-CM | POA: Diagnosis not present

## 2013-03-23 DIAGNOSIS — I2699 Other pulmonary embolism without acute cor pulmonale: Secondary | ICD-10-CM | POA: Diagnosis not present

## 2013-03-29 DIAGNOSIS — I824Y9 Acute embolism and thrombosis of unspecified deep veins of unspecified proximal lower extremity: Secondary | ICD-10-CM | POA: Diagnosis not present

## 2013-03-29 DIAGNOSIS — I2699 Other pulmonary embolism without acute cor pulmonale: Secondary | ICD-10-CM | POA: Diagnosis not present

## 2013-03-29 DIAGNOSIS — Z7901 Long term (current) use of anticoagulants: Secondary | ICD-10-CM | POA: Diagnosis not present

## 2013-03-29 DIAGNOSIS — I6990 Unspecified sequelae of unspecified cerebrovascular disease: Secondary | ICD-10-CM | POA: Diagnosis not present

## 2013-04-14 DIAGNOSIS — I824Y9 Acute embolism and thrombosis of unspecified deep veins of unspecified proximal lower extremity: Secondary | ICD-10-CM | POA: Diagnosis not present

## 2013-04-14 DIAGNOSIS — I6990 Unspecified sequelae of unspecified cerebrovascular disease: Secondary | ICD-10-CM | POA: Diagnosis not present

## 2013-04-14 DIAGNOSIS — I2699 Other pulmonary embolism without acute cor pulmonale: Secondary | ICD-10-CM | POA: Diagnosis not present

## 2013-04-14 DIAGNOSIS — Z7901 Long term (current) use of anticoagulants: Secondary | ICD-10-CM | POA: Diagnosis not present

## 2013-04-22 DIAGNOSIS — I10 Essential (primary) hypertension: Secondary | ICD-10-CM | POA: Diagnosis not present

## 2013-05-05 DIAGNOSIS — I2699 Other pulmonary embolism without acute cor pulmonale: Secondary | ICD-10-CM | POA: Diagnosis not present

## 2013-05-05 DIAGNOSIS — Z7901 Long term (current) use of anticoagulants: Secondary | ICD-10-CM | POA: Diagnosis not present

## 2013-05-05 DIAGNOSIS — I6990 Unspecified sequelae of unspecified cerebrovascular disease: Secondary | ICD-10-CM | POA: Diagnosis not present

## 2013-05-05 DIAGNOSIS — F3289 Other specified depressive episodes: Secondary | ICD-10-CM | POA: Diagnosis not present

## 2013-05-05 DIAGNOSIS — I824Y9 Acute embolism and thrombosis of unspecified deep veins of unspecified proximal lower extremity: Secondary | ICD-10-CM | POA: Diagnosis not present

## 2013-05-05 DIAGNOSIS — F329 Major depressive disorder, single episode, unspecified: Secondary | ICD-10-CM | POA: Diagnosis not present

## 2013-05-16 DIAGNOSIS — Z7901 Long term (current) use of anticoagulants: Secondary | ICD-10-CM | POA: Diagnosis not present

## 2013-05-16 DIAGNOSIS — I2699 Other pulmonary embolism without acute cor pulmonale: Secondary | ICD-10-CM | POA: Diagnosis not present

## 2013-05-16 DIAGNOSIS — I824Y9 Acute embolism and thrombosis of unspecified deep veins of unspecified proximal lower extremity: Secondary | ICD-10-CM | POA: Diagnosis not present

## 2013-06-03 DIAGNOSIS — I10 Essential (primary) hypertension: Secondary | ICD-10-CM | POA: Diagnosis not present

## 2013-06-03 DIAGNOSIS — R0681 Apnea, not elsewhere classified: Secondary | ICD-10-CM | POA: Diagnosis not present

## 2013-06-14 DIAGNOSIS — I6990 Unspecified sequelae of unspecified cerebrovascular disease: Secondary | ICD-10-CM | POA: Diagnosis not present

## 2013-06-14 DIAGNOSIS — I2699 Other pulmonary embolism without acute cor pulmonale: Secondary | ICD-10-CM | POA: Diagnosis not present

## 2013-06-14 DIAGNOSIS — I824Y9 Acute embolism and thrombosis of unspecified deep veins of unspecified proximal lower extremity: Secondary | ICD-10-CM | POA: Diagnosis not present

## 2013-06-14 DIAGNOSIS — Z7901 Long term (current) use of anticoagulants: Secondary | ICD-10-CM | POA: Diagnosis not present

## 2013-07-07 DIAGNOSIS — I10 Essential (primary) hypertension: Secondary | ICD-10-CM | POA: Diagnosis not present

## 2013-07-07 DIAGNOSIS — G4733 Obstructive sleep apnea (adult) (pediatric): Secondary | ICD-10-CM | POA: Diagnosis not present

## 2013-07-07 DIAGNOSIS — R0989 Other specified symptoms and signs involving the circulatory and respiratory systems: Secondary | ICD-10-CM | POA: Diagnosis not present

## 2013-07-07 DIAGNOSIS — G4710 Hypersomnia, unspecified: Secondary | ICD-10-CM | POA: Diagnosis not present

## 2013-07-07 DIAGNOSIS — R0609 Other forms of dyspnea: Secondary | ICD-10-CM | POA: Diagnosis not present

## 2013-07-14 ENCOUNTER — Emergency Department (HOSPITAL_COMMUNITY): Payer: Medicare Other

## 2013-07-14 ENCOUNTER — Emergency Department (HOSPITAL_COMMUNITY)
Admission: EM | Admit: 2013-07-14 | Discharge: 2013-07-14 | Disposition: A | Discharge status: Home / Self Care | Payer: Medicare Other | Attending: Emergency Medicine | Admitting: Emergency Medicine

## 2013-07-14 ENCOUNTER — Encounter (HOSPITAL_COMMUNITY): Payer: Self-pay | Admitting: Emergency Medicine

## 2013-07-14 DIAGNOSIS — S01111A Laceration without foreign body of right eyelid and periocular area, initial encounter: Secondary | ICD-10-CM

## 2013-07-14 DIAGNOSIS — I69922 Dysarthria following unspecified cerebrovascular disease: Secondary | ICD-10-CM | POA: Insufficient documentation

## 2013-07-14 DIAGNOSIS — Z86711 Personal history of pulmonary embolism: Secondary | ICD-10-CM | POA: Insufficient documentation

## 2013-07-14 DIAGNOSIS — Z87891 Personal history of nicotine dependence: Secondary | ICD-10-CM | POA: Insufficient documentation

## 2013-07-14 DIAGNOSIS — Z7901 Long term (current) use of anticoagulants: Secondary | ICD-10-CM | POA: Insufficient documentation

## 2013-07-14 DIAGNOSIS — G4730 Sleep apnea, unspecified: Secondary | ICD-10-CM | POA: Insufficient documentation

## 2013-07-14 DIAGNOSIS — W19XXXA Unspecified fall, initial encounter: Secondary | ICD-10-CM

## 2013-07-14 DIAGNOSIS — Z8673 Personal history of transient ischemic attack (TIA), and cerebral infarction without residual deficits: Secondary | ICD-10-CM | POA: Diagnosis not present

## 2013-07-14 DIAGNOSIS — E7800 Pure hypercholesterolemia, unspecified: Secondary | ICD-10-CM | POA: Insufficient documentation

## 2013-07-14 DIAGNOSIS — S0990XA Unspecified injury of head, initial encounter: Secondary | ICD-10-CM | POA: Insufficient documentation

## 2013-07-14 DIAGNOSIS — S0180XA Unspecified open wound of other part of head, initial encounter: Secondary | ICD-10-CM | POA: Insufficient documentation

## 2013-07-14 DIAGNOSIS — I10 Essential (primary) hypertension: Secondary | ICD-10-CM | POA: Insufficient documentation

## 2013-07-14 DIAGNOSIS — Y929 Unspecified place or not applicable: Secondary | ICD-10-CM | POA: Insufficient documentation

## 2013-07-14 DIAGNOSIS — Z79899 Other long term (current) drug therapy: Secondary | ICD-10-CM | POA: Insufficient documentation

## 2013-07-14 DIAGNOSIS — Y939 Activity, unspecified: Secondary | ICD-10-CM | POA: Insufficient documentation

## 2013-07-14 DIAGNOSIS — W010XXA Fall on same level from slipping, tripping and stumbling without subsequent striking against object, initial encounter: Secondary | ICD-10-CM | POA: Insufficient documentation

## 2013-07-14 HISTORY — DX: Sleep apnea, unspecified: G47.30

## 2013-07-14 MED ORDER — LIDOCAINE-EPINEPHRINE-TETRACAINE (LET) SOLUTION
3.0000 mL | Freq: Once | NASAL | Status: AC
  Administered 2013-07-14: 3 mL via TOPICAL
  Filled 2013-07-14: qty 3

## 2013-07-14 MED ORDER — LIDOCAINE-EPINEPHRINE-TETRACAINE (LET) TOPICAL GEL
3.0000 mL | Freq: Once | TOPICAL | Status: DC
  Filled 2013-07-14: qty 3

## 2013-07-14 NOTE — Discharge Instructions (Signed)
Facial Laceration ° A facial laceration is a cut on the face. These injuries can be painful and cause bleeding. Lacerations usually heal quickly, but they need special care to reduce scarring. °DIAGNOSIS  °Your health care provider will take a medical history, ask for details about how the injury occurred, and examine the wound to determine how deep the cut is. °TREATMENT  °Some facial lacerations may not require closure. Others may not be able to be closed because of an increased risk of infection. The risk of infection and the chance for successful closure will depend on various factors, including the amount of time since the injury occurred. °The wound may be cleaned to help prevent infection. If closure is appropriate, pain medicines may be given if needed. Your health care provider will use stitches (sutures), wound glue (adhesive), or skin adhesive strips to repair the laceration. These tools bring the skin edges together to allow for faster healing and a better cosmetic outcome. If needed, you may also be given a tetanus shot. °HOME CARE INSTRUCTIONS °· Only take over-the-counter or prescription medicines as directed by your health care provider. °· Follow your health care provider's instructions for wound care. These instructions will vary depending on the technique used for closing the wound. °For Sutures: °· Keep the wound clean and dry.   °· If you were given a bandage (dressing), you should change it at least once a day. Also change the dressing if it becomes wet or dirty, or as directed by your health care provider.   °· Wash the wound with soap and water 2 times a day. Rinse the wound off with water to remove all soap. Pat the wound dry with a clean towel.   °· After cleaning, apply a thin layer of the antibiotic ointment recommended by your health care provider. This will help prevent infection and keep the dressing from sticking.   °· You may shower as usual after the first 24 hours. Do not soak the  wound in water until the sutures are removed.   °· Get your sutures removed as directed by your health care provider. With facial lacerations, sutures should usually be taken out after 4 5 days to avoid stitch marks.   °· Wait a few days after your sutures are removed before applying any makeup. °For Skin Adhesive Strips: °· Keep the wound clean and dry.   °· Do not get the skin adhesive strips wet. You may bathe carefully, using caution to keep the wound dry.   °· If the wound gets wet, pat it dry with a clean towel.   °· Skin adhesive strips will fall off on their own. You may trim the strips as the wound heals. Do not remove skin adhesive strips that are still stuck to the wound. They will fall off in time.   °For Wound Adhesive: °· You may briefly wet your wound in the shower or bath. Do not soak or scrub the wound. Do not swim. Avoid periods of heavy sweating until the skin adhesive has fallen off on its own. After showering or bathing, gently pat the wound dry with a clean towel.   °· Do not apply liquid medicine, cream medicine, ointment medicine, or makeup to your wound while the skin adhesive is in place. This may loosen the film before your wound is healed.   °· If a dressing is placed over the wound, be careful not to apply tape directly over the skin adhesive. This may cause the adhesive to be pulled off before the wound is healed.   °·   Avoid prolonged exposure to sunlight or tanning lamps while the skin adhesive is in place. °· The skin adhesive will usually remain in place for 5 10 days, then naturally fall off the skin. Do not pick at the adhesive film.   °After Healing: °Once the wound has healed, cover the wound with sunscreen during the day for 1 full year. This can help minimize scarring. Exposure to ultraviolet light in the first year will darken the scar. It can take 1 2 years for the scar to lose its redness and to heal completely.  °SEEK IMMEDIATE MEDICAL CARE IF: °· You have redness, pain, or  swelling around the wound.   °· You see a yellowish-white fluid (pus) coming from the wound.   °· You have chills or a fever.   °MAKE SURE YOU: °· Understand these instructions. °· Will watch your condition. °· Will get help right away if you are not doing well or get worse. °Document Released: 04/17/2004 Document Revised: 12/29/2012 Document Reviewed: 10/21/2012 °[employer]® Patient Information ©2014 [employer]. ° °

## 2013-07-14 NOTE — ED Provider Notes (Signed)
CSN: [id number]     Arrival date & time 07/14/13  1610 History   First MD Initiated Contact with Patient 07/14/13 1658     Chief Complaint  Patient presents with  . Fall     (Consider location/radiation/quality/duration/timing/severity/associated sxs/prior Treatment) HPI Comments: Mechanical fall, tripped over a towel, R face hit floor. No LOC.   Patient is a 73 y.o. male presenting with fall. The history is provided by the patient.  Fall This is a recurrent problem. The current episode started 1 to 2 hours ago. Episode frequency: once. The problem has been gradually worsening. Pertinent negatives include no chest pain, no abdominal pain and no shortness of breath. Nothing aggravates the symptoms. Nothing relieves the symptoms.    Past Medical History  Diagnosis Date  . CVA (cerebral infarction)   . Hypertension   . Hypercholesteremia   . Pulmonary embolism   . Sleep apnea    Past Surgical History  Procedure Laterality Date  . Total hip arthroplasty    . Leg surgery    . Hernia repair     No family history on file. History  Substance Use Topics  . Smoking status: Former Smoker    Quit date: 03/24/2006  . Smokeless tobacco: Not on file  . Alcohol Use: No    Review of Systems  Constitutional: Negative for fever and chills.  Respiratory: Negative for shortness of breath.   Cardiovascular: Negative for chest pain.  Gastrointestinal: Negative for abdominal pain.  All other systems reviewed and are negative.     Allergies  Review of patient's allergies indicates no known allergies.  Home Medications   Prior to Admission medications   Medication Sig Start Date End Date Taking? Authorizing Provider  amLODipine (NORVASC) 10 MG tablet Take 10 mg by mouth at bedtime.   Yes Historical Provider, MD  atorvastatin (LIPITOR) 40 MG tablet Take 40 mg by mouth daily.   Yes Historical Provider, MD  citalopram (CELEXA) 40 MG tablet Take 40 mg by mouth at bedtime.   Yes  Historical Provider, MD  clonazePAM (KLONOPIN) 0.5 MG tablet Take 0.5 mg by mouth at bedtime.   Yes Historical Provider, MD  Cyanocobalamin (VITAMIN B-12 PO) Take 1 tablet by mouth daily.   Yes Historical Provider, MD  fish oil-omega-3 fatty acids 1000 MG capsule Take 1 g by mouth daily.   Yes Historical Provider, MD  losartan-hydrochlorothiazide (HYZAAR) 100-25 MG per tablet Take 1 tablet by mouth daily.   Yes Historical Provider, MD  Multiple Vitamin (MULTIVITAMIN WITH MINERALS) TABS tablet Take 1 tablet by mouth daily.   Yes Historical Provider, MD  omeprazole (PRILOSEC) 20 MG capsule Take 20 mg by mouth daily.   Yes Historical Provider, MD  warfarin (COUMADIN) 5 MG tablet Take 5-7.5 mg by mouth every evening. Take 1.5 tablets on Tuesday and Thursday and take 1 tablet daily the rest of the days.   Yes Historical Provider, MD   BP 123/75  Pulse 68  Temp(Src) 97.8 F (36.6 C) (Oral)  Resp 18  Ht 5\' 10"  (1.778 m)  Wt 194 lb (87.998 kg)  BMI 27.84 kg/m2  SpO2 94% Physical Exam  Nursing note and vitals reviewed. Constitutional: He is oriented to person, place, and time. He appears well-developed and well-nourished. No distress.  HENT:  Head: Normocephalic.    Mouth/Throat: Oropharynx is clear and moist. No oropharyngeal exudate.  EOMs intact, no signs of entrapment  Eyes: EOM are normal. Pupils are equal, round, and reactive to light.  Neck: Normal range of motion. Neck supple.  Cardiovascular: Normal rate and regular rhythm.  Exam reveals no friction rub.   No murmur heard. Pulmonary/Chest: Effort normal and breath sounds normal. No respiratory distress. He has no wheezes. He has no rales.  Abdominal: Soft. He exhibits no distension. There is no tenderness. There is no rebound.  Musculoskeletal: He exhibits no edema and no tenderness.  R side weakness, from prior stroke   Neurological: He is alert and oriented to person, place, and time.  Mild dysarthria, chronic  Skin: No rash  noted. He is not diaphoretic.    ED Course  LACERATION REPAIR Date/Time: 07/15/2013 12:37 AM Performed by: Osvaldo Shipper Authorized by: Osvaldo Shipper Consent: Verbal consent obtained. Body area: head/neck Location details: right eyebrow Laceration length: 2 cm Foreign bodies: no foreign bodies Tendon involvement: none Nerve involvement: none Vascular damage: no Anesthesia: local infiltration Local anesthetic: LET (lido,epi,tetracaine) Patient sedated: no Preparation: Patient was prepped and draped in the usual sterile fashion. Irrigation solution: saline Irrigation method: syringe Amount of cleaning: standard Debridement: none Degree of undermining: none Skin closure: 5-0 Prolene Number of sutures: 2 Technique: simple Approximation: close Approximation difficulty: simple Patient tolerance: Patient tolerated the procedure well with no immediate complications.  LACERATION REPAIR Date/Time: 07/15/2013 12:38 AM Performed by: Osvaldo Shipper Authorized by: Osvaldo Shipper Consent: Verbal consent obtained. Body area: head/neck Location details: right cheek Laceration length: 2 cm Foreign bodies: no foreign bodies Tendon involvement: none Nerve involvement: none Vascular damage: no Patient sedated: no Preparation: Patient was prepped and draped in the usual sterile fashion. Irrigation solution: saline Irrigation method: tap Amount of cleaning: standard Debridement: none Degree of undermining: none Skin closure: glue Technique: simple Approximation: close Approximation difficulty: simple   (including critical care time) Labs Review Labs Reviewed - No data to display  Imaging Review Ct Head Wo Contrast  07/14/2013   CLINICAL DATA:  Fall today with right facial laceration. On Coumadin.  EXAM: CT HEAD WITHOUT CONTRAST  CT MAXILLOFACIAL WITHOUT CONTRAST  TECHNIQUE: Multidetector CT imaging of the head and maxillofacial structures were  performed using the standard protocol without intravenous contrast. Multiplanar CT image reconstructions of the maxillofacial structures were also generated.  COMPARISON:  Head CT 01/12/2013.  FINDINGS: CT HEAD FINDINGS  There is no evidence of acute intracranial hemorrhage, mass lesion, brain edema or extra-axial fluid collection. The ventricles and subarachnoid spaces are diffusely prominent but stable. There is no CT evidence of acute cortical infarction. Chronic small vessel ischemic changes within the basal ganglia and periventricular white matter are stable.  The visualized paranasal sinuses, mastoid air cells and middle ears are clear. The calvarium is intact. There is stable chronic deformity of the right mandibular condyle.  CT MAXILLOFACIAL FINDINGS  There is soft tissue swelling in the right malar region. There is no evidence of orbital hematoma.  The paranasal sinuses are clear without air-fluid levels. No acute facial fractures are identified. There is stable posttraumatic deformity of the right mandibular condylar head and neck. Upper dentures are noted.  IMPRESSION: 1. No acute intracranial or calvarial findings. 2. Stable atrophy and chronic small vessel ischemic changes. 3. Right facial soft tissue injury. No evidence of orbital hematoma or acute facial fracture.   Electronically Signed   By: Camie Patience M.D.   On: 07/14/2013 20:19   Ct Maxillofacial Wo Cm  07/14/2013   CLINICAL DATA:  Fall today with right facial laceration. On Coumadin.  EXAM: CT HEAD WITHOUT CONTRAST  CT MAXILLOFACIAL WITHOUT CONTRAST  TECHNIQUE: Multidetector CT imaging of the head and maxillofacial structures were performed using the standard protocol without intravenous contrast. Multiplanar CT image reconstructions of the maxillofacial structures were also generated.  COMPARISON:  Head CT 01/12/2013.  FINDINGS: CT HEAD FINDINGS  There is no evidence of acute intracranial hemorrhage, mass lesion, brain edema or extra-axial  fluid collection. The ventricles and subarachnoid spaces are diffusely prominent but stable. There is no CT evidence of acute cortical infarction. Chronic small vessel ischemic changes within the basal ganglia and periventricular white matter are stable.  The visualized paranasal sinuses, mastoid air cells and middle ears are clear. The calvarium is intact. There is stable chronic deformity of the right mandibular condyle.  CT MAXILLOFACIAL FINDINGS  There is soft tissue swelling in the right malar region. There is no evidence of orbital hematoma.  The paranasal sinuses are clear without air-fluid levels. No acute facial fractures are identified. There is stable posttraumatic deformity of the right mandibular condylar head and neck. Upper dentures are noted.  IMPRESSION: 1. No acute intracranial or calvarial findings. 2. Stable atrophy and chronic small vessel ischemic changes. 3. Right facial soft tissue injury. No evidence of orbital hematoma or acute facial fracture.   Electronically Signed   By: Camie Patience M.D.   On: 07/14/2013 20:19     EKG Interpretation None      MDM   Final diagnoses:  Fall  Laceration of right eyebrow    74 year old male on Coumadin for prior stroke, PE presents with mechanical fall. Tripped over a towel in the kitchen. Right face hit the floor. No loss of consciousness. On exam has right facial swelling of the cheek. Small superficial laceration to the right lateral eyebrow, right upper cheek. Both lacerations amenable to glue repair. Tetanus is up-to-date. No neck tenderness. No portable deformity, crepitus. EOMs are intact, no signs of entrapment. Pupils reactive equally. No other signs of trauma. Chest stable, pelvis is stable, belly is benign. We'll CT his head and face Scans normal. Lac repair as above.   Osvaldo Shipper, MD 07/15/13 [phone]

## 2013-07-14 NOTE — ED Notes (Addendum)
Pt reports falling this afternoon striking right face on cement. Laceration, hematoma to right cheek; bleeding controlled. PERRLA, 41mm. Denies LOC, wife reports frequent falls at home. Pt currently takes Coumadin. AO at baseline per wife (oriented to self, place). Reports pain 5/10 to face and HA.

## 2013-07-14 NOTE — ED Notes (Signed)
Patient transported to CT 

## 2013-07-19 DIAGNOSIS — I824Y9 Acute embolism and thrombosis of unspecified deep veins of unspecified proximal lower extremity: Secondary | ICD-10-CM | POA: Diagnosis not present

## 2013-07-19 DIAGNOSIS — R0609 Other forms of dyspnea: Secondary | ICD-10-CM | POA: Diagnosis not present

## 2013-07-19 DIAGNOSIS — G4710 Hypersomnia, unspecified: Secondary | ICD-10-CM | POA: Diagnosis not present

## 2013-07-19 DIAGNOSIS — Z4802 Encounter for removal of sutures: Secondary | ICD-10-CM | POA: Diagnosis not present

## 2013-07-19 DIAGNOSIS — Z7901 Long term (current) use of anticoagulants: Secondary | ICD-10-CM | POA: Diagnosis not present

## 2013-07-19 DIAGNOSIS — R0989 Other specified symptoms and signs involving the circulatory and respiratory systems: Secondary | ICD-10-CM | POA: Diagnosis not present

## 2013-07-19 DIAGNOSIS — G4733 Obstructive sleep apnea (adult) (pediatric): Secondary | ICD-10-CM | POA: Diagnosis not present

## 2013-07-19 DIAGNOSIS — I10 Essential (primary) hypertension: Secondary | ICD-10-CM | POA: Diagnosis not present

## 2013-08-18 DIAGNOSIS — I10 Essential (primary) hypertension: Secondary | ICD-10-CM | POA: Diagnosis not present

## 2013-08-18 DIAGNOSIS — Z7901 Long term (current) use of anticoagulants: Secondary | ICD-10-CM | POA: Diagnosis not present

## 2013-08-18 DIAGNOSIS — I2699 Other pulmonary embolism without acute cor pulmonale: Secondary | ICD-10-CM | POA: Diagnosis not present

## 2013-08-18 DIAGNOSIS — I824Y9 Acute embolism and thrombosis of unspecified deep veins of unspecified proximal lower extremity: Secondary | ICD-10-CM | POA: Diagnosis not present

## 2013-09-15 DIAGNOSIS — I824Y9 Acute embolism and thrombosis of unspecified deep veins of unspecified proximal lower extremity: Secondary | ICD-10-CM | POA: Diagnosis not present

## 2013-09-15 DIAGNOSIS — Z7901 Long term (current) use of anticoagulants: Secondary | ICD-10-CM | POA: Diagnosis not present

## 2013-10-13 DIAGNOSIS — Z7901 Long term (current) use of anticoagulants: Secondary | ICD-10-CM | POA: Diagnosis not present

## 2013-10-13 DIAGNOSIS — I824Y9 Acute embolism and thrombosis of unspecified deep veins of unspecified proximal lower extremity: Secondary | ICD-10-CM | POA: Diagnosis not present

## 2013-11-10 DIAGNOSIS — Z7901 Long term (current) use of anticoagulants: Secondary | ICD-10-CM | POA: Diagnosis not present

## 2013-11-10 DIAGNOSIS — I824Y9 Acute embolism and thrombosis of unspecified deep veins of unspecified proximal lower extremity: Secondary | ICD-10-CM | POA: Diagnosis not present

## 2013-11-29 DIAGNOSIS — S0993XA Unspecified injury of face, initial encounter: Secondary | ICD-10-CM | POA: Diagnosis not present

## 2013-11-29 DIAGNOSIS — W19XXXA Unspecified fall, initial encounter: Secondary | ICD-10-CM | POA: Diagnosis not present

## 2013-12-06 DIAGNOSIS — Z4802 Encounter for removal of sutures: Secondary | ICD-10-CM | POA: Diagnosis not present

## 2013-12-06 DIAGNOSIS — S0100XA Unspecified open wound of scalp, initial encounter: Secondary | ICD-10-CM | POA: Diagnosis not present

## 2013-12-12 DIAGNOSIS — I824Y9 Acute embolism and thrombosis of unspecified deep veins of unspecified proximal lower extremity: Secondary | ICD-10-CM | POA: Diagnosis not present

## 2013-12-12 DIAGNOSIS — Z7901 Long term (current) use of anticoagulants: Secondary | ICD-10-CM | POA: Diagnosis not present

## 2014-01-06 DIAGNOSIS — Z23 Encounter for immunization: Secondary | ICD-10-CM | POA: Diagnosis not present

## 2014-01-09 DIAGNOSIS — Z86718 Personal history of other venous thrombosis and embolism: Secondary | ICD-10-CM | POA: Diagnosis not present

## 2014-01-09 DIAGNOSIS — Z7901 Long term (current) use of anticoagulants: Secondary | ICD-10-CM | POA: Diagnosis not present

## 2014-02-06 DIAGNOSIS — Z86718 Personal history of other venous thrombosis and embolism: Secondary | ICD-10-CM | POA: Diagnosis not present

## 2014-02-06 DIAGNOSIS — Z7901 Long term (current) use of anticoagulants: Secondary | ICD-10-CM | POA: Diagnosis not present

## 2014-02-22 DIAGNOSIS — E780 Pure hypercholesterolemia: Secondary | ICD-10-CM | POA: Diagnosis not present

## 2014-02-22 DIAGNOSIS — Z23 Encounter for immunization: Secondary | ICD-10-CM | POA: Diagnosis not present

## 2014-02-22 DIAGNOSIS — Z125 Encounter for screening for malignant neoplasm of prostate: Secondary | ICD-10-CM | POA: Diagnosis not present

## 2014-02-22 DIAGNOSIS — I10 Essential (primary) hypertension: Secondary | ICD-10-CM | POA: Diagnosis not present

## 2014-02-22 DIAGNOSIS — Z0000 Encounter for general adult medical examination without abnormal findings: Secondary | ICD-10-CM | POA: Diagnosis not present

## 2014-02-27 DIAGNOSIS — I10 Essential (primary) hypertension: Secondary | ICD-10-CM | POA: Diagnosis not present

## 2014-02-27 DIAGNOSIS — Z7901 Long term (current) use of anticoagulants: Secondary | ICD-10-CM | POA: Diagnosis not present

## 2014-02-27 DIAGNOSIS — F419 Anxiety disorder, unspecified: Secondary | ICD-10-CM | POA: Diagnosis not present

## 2014-02-27 DIAGNOSIS — Z86718 Personal history of other venous thrombosis and embolism: Secondary | ICD-10-CM | POA: Diagnosis not present

## 2014-03-06 DIAGNOSIS — Z86718 Personal history of other venous thrombosis and embolism: Secondary | ICD-10-CM | POA: Diagnosis not present

## 2014-03-06 DIAGNOSIS — Z7901 Long term (current) use of anticoagulants: Secondary | ICD-10-CM | POA: Diagnosis not present

## 2014-04-04 DIAGNOSIS — Z7901 Long term (current) use of anticoagulants: Secondary | ICD-10-CM | POA: Diagnosis not present

## 2014-04-04 DIAGNOSIS — Z86718 Personal history of other venous thrombosis and embolism: Secondary | ICD-10-CM | POA: Diagnosis not present

## 2014-05-02 DIAGNOSIS — Z86718 Personal history of other venous thrombosis and embolism: Secondary | ICD-10-CM | POA: Diagnosis not present

## 2014-05-02 DIAGNOSIS — Z7901 Long term (current) use of anticoagulants: Secondary | ICD-10-CM | POA: Diagnosis not present

## 2014-05-25 DIAGNOSIS — Z7901 Long term (current) use of anticoagulants: Secondary | ICD-10-CM | POA: Diagnosis not present

## 2014-05-25 DIAGNOSIS — Z86718 Personal history of other venous thrombosis and embolism: Secondary | ICD-10-CM | POA: Diagnosis not present

## 2014-05-26 DIAGNOSIS — C44629 Squamous cell carcinoma of skin of left upper limb, including shoulder: Secondary | ICD-10-CM | POA: Diagnosis not present

## 2014-05-26 DIAGNOSIS — C44310 Basal cell carcinoma of skin of unspecified parts of face: Secondary | ICD-10-CM | POA: Diagnosis not present

## 2014-05-26 DIAGNOSIS — L570 Actinic keratosis: Secondary | ICD-10-CM | POA: Diagnosis not present

## 2014-05-26 DIAGNOSIS — Z1283 Encounter for screening for malignant neoplasm of skin: Secondary | ICD-10-CM | POA: Diagnosis not present

## 2014-05-26 DIAGNOSIS — X32XXXD Exposure to sunlight, subsequent encounter: Secondary | ICD-10-CM | POA: Diagnosis not present

## 2014-05-26 DIAGNOSIS — D225 Melanocytic nevi of trunk: Secondary | ICD-10-CM | POA: Diagnosis not present

## 2014-06-22 DIAGNOSIS — Z7901 Long term (current) use of anticoagulants: Secondary | ICD-10-CM | POA: Diagnosis not present

## 2014-06-22 DIAGNOSIS — Z86718 Personal history of other venous thrombosis and embolism: Secondary | ICD-10-CM | POA: Diagnosis not present

## 2014-07-17 DIAGNOSIS — Z86718 Personal history of other venous thrombosis and embolism: Secondary | ICD-10-CM | POA: Diagnosis not present

## 2014-07-17 DIAGNOSIS — Z7901 Long term (current) use of anticoagulants: Secondary | ICD-10-CM | POA: Diagnosis not present

## 2014-08-15 DIAGNOSIS — Z85828 Personal history of other malignant neoplasm of skin: Secondary | ICD-10-CM | POA: Diagnosis not present

## 2014-08-15 DIAGNOSIS — L570 Actinic keratosis: Secondary | ICD-10-CM | POA: Diagnosis not present

## 2014-08-15 DIAGNOSIS — X32XXXD Exposure to sunlight, subsequent encounter: Secondary | ICD-10-CM | POA: Diagnosis not present

## 2014-08-15 DIAGNOSIS — Z08 Encounter for follow-up examination after completed treatment for malignant neoplasm: Secondary | ICD-10-CM | POA: Diagnosis not present

## 2014-08-17 DIAGNOSIS — Z7901 Long term (current) use of anticoagulants: Secondary | ICD-10-CM | POA: Diagnosis not present

## 2014-08-17 DIAGNOSIS — Z86718 Personal history of other venous thrombosis and embolism: Secondary | ICD-10-CM | POA: Diagnosis not present

## 2014-08-30 DIAGNOSIS — Z86718 Personal history of other venous thrombosis and embolism: Secondary | ICD-10-CM | POA: Diagnosis not present

## 2014-08-30 DIAGNOSIS — Z7901 Long term (current) use of anticoagulants: Secondary | ICD-10-CM | POA: Diagnosis not present

## 2014-08-30 DIAGNOSIS — F5104 Psychophysiologic insomnia: Secondary | ICD-10-CM | POA: Diagnosis not present

## 2014-08-30 DIAGNOSIS — I10 Essential (primary) hypertension: Secondary | ICD-10-CM | POA: Diagnosis not present

## 2014-09-14 DIAGNOSIS — Z7901 Long term (current) use of anticoagulants: Secondary | ICD-10-CM | POA: Diagnosis not present

## 2014-09-14 DIAGNOSIS — Z86718 Personal history of other venous thrombosis and embolism: Secondary | ICD-10-CM | POA: Diagnosis not present

## 2014-10-19 DIAGNOSIS — Z86718 Personal history of other venous thrombosis and embolism: Secondary | ICD-10-CM | POA: Diagnosis not present

## 2014-10-19 DIAGNOSIS — Z7901 Long term (current) use of anticoagulants: Secondary | ICD-10-CM | POA: Diagnosis not present

## 2014-10-26 ENCOUNTER — Emergency Department (HOSPITAL_COMMUNITY): Payer: Medicare Other

## 2014-10-26 ENCOUNTER — Encounter (HOSPITAL_COMMUNITY): Payer: Self-pay | Admitting: *Deleted

## 2014-10-26 ENCOUNTER — Emergency Department (HOSPITAL_COMMUNITY)
Admission: EM | Admit: 2014-10-26 | Discharge: 2014-10-26 | Disposition: A | Discharge status: Home / Self Care | Payer: Medicare Other | Attending: Physician Assistant | Admitting: Physician Assistant

## 2014-10-26 DIAGNOSIS — S199XXA Unspecified injury of neck, initial encounter: Secondary | ICD-10-CM | POA: Diagnosis not present

## 2014-10-26 DIAGNOSIS — W19XXXA Unspecified fall, initial encounter: Secondary | ICD-10-CM

## 2014-10-26 DIAGNOSIS — E780 Pure hypercholesterolemia: Secondary | ICD-10-CM | POA: Diagnosis not present

## 2014-10-26 DIAGNOSIS — Z86711 Personal history of pulmonary embolism: Secondary | ICD-10-CM | POA: Diagnosis not present

## 2014-10-26 DIAGNOSIS — Y9289 Other specified places as the place of occurrence of the external cause: Secondary | ICD-10-CM | POA: Insufficient documentation

## 2014-10-26 DIAGNOSIS — Z87891 Personal history of nicotine dependence: Secondary | ICD-10-CM | POA: Insufficient documentation

## 2014-10-26 DIAGNOSIS — S0990XA Unspecified injury of head, initial encounter: Secondary | ICD-10-CM | POA: Insufficient documentation

## 2014-10-26 DIAGNOSIS — W01198A Fall on same level from slipping, tripping and stumbling with subsequent striking against other object, initial encounter: Secondary | ICD-10-CM | POA: Insufficient documentation

## 2014-10-26 DIAGNOSIS — Z8673 Personal history of transient ischemic attack (TIA), and cerebral infarction without residual deficits: Secondary | ICD-10-CM | POA: Diagnosis not present

## 2014-10-26 DIAGNOSIS — S0181XA Laceration without foreign body of other part of head, initial encounter: Secondary | ICD-10-CM | POA: Diagnosis not present

## 2014-10-26 DIAGNOSIS — Z79899 Other long term (current) drug therapy: Secondary | ICD-10-CM | POA: Insufficient documentation

## 2014-10-26 DIAGNOSIS — Y998 Other external cause status: Secondary | ICD-10-CM | POA: Insufficient documentation

## 2014-10-26 DIAGNOSIS — Z8669 Personal history of other diseases of the nervous system and sense organs: Secondary | ICD-10-CM | POA: Diagnosis not present

## 2014-10-26 DIAGNOSIS — Z7901 Long term (current) use of anticoagulants: Secondary | ICD-10-CM | POA: Insufficient documentation

## 2014-10-26 DIAGNOSIS — Z23 Encounter for immunization: Secondary | ICD-10-CM | POA: Insufficient documentation

## 2014-10-26 DIAGNOSIS — Y9389 Activity, other specified: Secondary | ICD-10-CM | POA: Diagnosis not present

## 2014-10-26 DIAGNOSIS — I10 Essential (primary) hypertension: Secondary | ICD-10-CM | POA: Insufficient documentation

## 2014-10-26 DIAGNOSIS — R079 Chest pain, unspecified: Secondary | ICD-10-CM | POA: Diagnosis not present

## 2014-10-26 MED ORDER — TETANUS-DIPHTH-ACELL PERTUSSIS 5-2.5-18.5 LF-MCG/0.5 IM SUSP
0.5000 mL | Freq: Once | INTRAMUSCULAR | Status: AC
  Administered 2014-10-26: 0.5 mL via INTRAMUSCULAR
  Filled 2014-10-26: qty 0.5

## 2014-10-26 MED ORDER — LIDOCAINE-EPINEPHRINE 1 %-1:100000 IJ SOLN
30.0000 mL | Freq: Once | INTRAMUSCULAR | Status: AC
  Administered 2014-10-26: 30 mL
  Filled 2014-10-26: qty 1

## 2014-10-26 NOTE — ED Provider Notes (Signed)
CSN: [id number]     Arrival date & time 10/26/14  1029 History   First MD Initiated Contact with Patient 10/26/14 1050     Chief Complaint  Patient presents with  . Fall    on blood thinners     (Consider location/radiation/quality/duration/timing/severity/associated sxs/prior Treatment) HPI   Patient is a 74 year old male presenting today after fall. Patient takes Coumadin. Patient has had multiple prior strokes. He was trying to his truck today and had a mechanical fall onto his head. No loss of consciousness. No other complaints. Patient is dysarthric at baseline.  Past Medical History  Diagnosis Date  . CVA (cerebral infarction)   . Hypertension   . Hypercholesteremia   . Pulmonary embolism   . Sleep apnea    Past Surgical History  Procedure Laterality Date  . Total hip arthroplasty    . Leg surgery    . Hernia repair     No family history on file. History  Substance Use Topics  . Smoking status: Former Smoker    Quit date: 03/24/2006  . Smokeless tobacco: Not on file  . Alcohol Use: Yes     Comment: 2 beers per day    Review of Systems  Constitutional: Negative for fever and activity change.  HENT: Negative for drooling and hearing loss.   Eyes: Negative for discharge and redness.  Respiratory: Negative for cough and shortness of breath.   Cardiovascular: Negative for chest pain.  Gastrointestinal: Negative for abdominal pain.  Genitourinary: Negative for dysuria and urgency.  Musculoskeletal: Negative for arthralgias.  Allergic/Immunologic: Negative for immunocompromised state.  Neurological: Negative for seizures and speech difficulty.  Psychiatric/Behavioral: Negative for behavioral problems and agitation.  All other systems reviewed and are negative.     Allergies  Atenolol; Lotensin; and Wellbutrin  Home Medications   Prior to Admission medications   Medication Sig Start Date End Date Taking? Authorizing Provider  amLODipine (NORVASC) 10 MG  tablet Take 10 mg by mouth at bedtime.   Yes Historical Provider, MD  atorvastatin (LIPITOR) 40 MG tablet Take 40 mg by mouth daily.   Yes Historical Provider, MD  citalopram (CELEXA) 40 MG tablet Take 40 mg by mouth at bedtime.   Yes Historical Provider, MD  fish oil-omega-3 fatty acids 1000 MG capsule Take 1 g by mouth daily.   Yes Historical Provider, MD  losartan-hydrochlorothiazide (HYZAAR) 100-25 MG per tablet Take 1 tablet by mouth daily.   Yes Historical Provider, MD  Multiple Vitamin (MULTIVITAMIN WITH MINERALS) TABS tablet Take 1 tablet by mouth daily.   Yes Historical Provider, MD  omeprazole (PRILOSEC) 20 MG capsule Take 20 mg by mouth daily.   Yes Historical Provider, MD  traZODone (DESYREL) 50 MG tablet Take 50 mg by mouth at bedtime.   Yes Historical Provider, MD  vitamin B-12 (CYANOCOBALAMIN) 1000 MCG tablet Take 1,000 mcg by mouth daily.   Yes Historical Provider, MD  warfarin (COUMADIN) 5 MG tablet Take 5-7.5 mg by mouth See admin instructions. Take 1.5 tablets everyday but Tuesday take 1 tablet.   Yes Historical Provider, MD   BP 134/77 mmHg  Pulse 73  Temp(Src) 98.4 F (36.9 C) (Oral)  Resp 19  SpO2 97% Physical Exam  Constitutional: He is oriented to person, place, and time. He appears well-nourished.  HENT:  Head: Normocephalic.  Mouth/Throat: Oropharynx is clear and moist.  3 cm laceration to right forehead.  Eyes: Conjunctivae are normal.  Neck: No tracheal deviation present.  Cardiovascular: Normal rate and regular  rhythm.   Pulmonary/Chest: Effort normal. No stridor. No respiratory distress. He has no wheezes. He has no rales.  Abdominal: Soft. There is no tenderness. There is no guarding.  Musculoskeletal: Normal range of motion. He exhibits no edema.  Mild specificity of bilateral upper extremities. Normal range of motion of upper and lower examination.  Neurological: He is oriented to person, place, and time. No cranial nerve deficit.  Dysarthria  Skin:  Skin is warm and dry. No rash noted. He is not diaphoretic.  Psychiatric: He has a normal mood and affect. His behavior is normal.  Nursing note and vitals reviewed.   ED Course  Procedures (including critical care time) Labs Review Labs Reviewed - No data to display  Imaging Review Dg Chest 2 View  10/26/2014   CLINICAL DATA:  Patient with chest pain and confusion.  EXAM: CHEST  2 VIEW  COMPARISON:  01/12/2013  FINDINGS: Cardiomediastinal silhouette is normal. Mediastinal contours appear intact. The aorta is torturous and contains atherosclerotic calcifications.  There is no evidence of focal airspace consolidation, pleural effusion or pneumothorax. Again seen is bibasilar lung scarring and bilateral calcified pleural plaque.  Osseous structures are without acute abnormality. Left-sided rib deformity with chronic appearance is noted, likely due to healed rib fractures. Soft tissues are grossly normal.  IMPRESSION: No radiographic evidence of acute cardiopulmonary abnormality.   Electronically Signed   By: Fidela Salisbury M.D.   On: 10/26/2014 13:06   Ct Head Wo Contrast  10/26/2014   CLINICAL DATA:  74 year old male fell hitting head. No loss of consciousness. On blood thinner for prior strokes. History of high blood pressure. Initial encounter.  EXAM: CT HEAD WITHOUT CONTRAST  CT CERVICAL SPINE WITHOUT CONTRAST  TECHNIQUE: Multidetector CT imaging of the head and cervical spine was performed following the standard protocol without intravenous contrast. Multiplanar CT image reconstructions of the cervical spine were also generated.  COMPARISON:  07/14/2013 head CT.  FINDINGS: CT HEAD FINDINGS  No skull fracture or intracranial hemorrhage.  Remote basal ganglia infarcts and moderate small vessel disease type changes without CT evidence of large acute infarct.  Global atrophy without hydrocephalus.  No intracranial mass lesion noted on this unenhanced exam.  Post lens replacement otherwise orbital  structures unremarkable.  Vascular calcifications.  CT CERVICAL SPINE FINDINGS  No cervical spine fracture or malalignment. No abnormal prevertebral soft tissue swelling.  Cervical spondylotic changes with various degrees of spinal stenosis and foraminal narrowing.  Biapical pleural thickening. Vascular calcifications. No worrisome neck mass detected.  IMPRESSION: CT HEAD  No skull fracture or intracranial hemorrhage.  Remote basal ganglia infarcts and moderate small vessel disease type changes without CT evidence of large acute infarct.  CT CERVICAL SPINE  No cervical spine fracture or malalignment. No abnormal prevertebral soft tissue swelling.  Cervical spondylotic changes with various degrees of spinal stenosis and foraminal narrowing.   Electronically Signed   By: Genia Del M.D.   On: 10/26/2014 11:39   Ct Cervical Spine Wo Contrast  10/26/2014   CLINICAL DATA:  74 year old male fell hitting head. No loss of consciousness. On blood thinner for prior strokes. History of high blood pressure. Initial encounter.  EXAM: CT HEAD WITHOUT CONTRAST  CT CERVICAL SPINE WITHOUT CONTRAST  TECHNIQUE: Multidetector CT imaging of the head and cervical spine was performed following the standard protocol without intravenous contrast. Multiplanar CT image reconstructions of the cervical spine were also generated.  COMPARISON:  07/14/2013 head CT.  FINDINGS: CT HEAD FINDINGS  No  skull fracture or intracranial hemorrhage.  Remote basal ganglia infarcts and moderate small vessel disease type changes without CT evidence of large acute infarct.  Global atrophy without hydrocephalus.  No intracranial mass lesion noted on this unenhanced exam.  Post lens replacement otherwise orbital structures unremarkable.  Vascular calcifications.  CT CERVICAL SPINE FINDINGS  No cervical spine fracture or malalignment. No abnormal prevertebral soft tissue swelling.  Cervical spondylotic changes with various degrees of spinal stenosis and  foraminal narrowing.  Biapical pleural thickening. Vascular calcifications. No worrisome neck mass detected.  IMPRESSION: CT HEAD  No skull fracture or intracranial hemorrhage.  Remote basal ganglia infarcts and moderate small vessel disease type changes without CT evidence of large acute infarct.  CT CERVICAL SPINE  No cervical spine fracture or malalignment. No abnormal prevertebral soft tissue swelling.  Cervical spondylotic changes with various degrees of spinal stenosis and foraminal narrowing.   Electronically Signed   By: Genia Del M.D.   On: 10/26/2014 11:39     EKG Interpretation None        EKG Interpretation  Date/Time:    Ventricular Rate:    PR Interval:    QRS Duration:   QT Interval:    QTC Calculation:   R Axis:     Text Interpretation:         EKG shows no acute ischemia. SImilar to prior.   MDM   Final diagnoses:  None    Patient is a 74 year old gentleman presenting after fall and Coumadin. We will get CT head and neck. Patient has no other complaints. On exam mildly  decreased breath sounds on the right-hand side. Will get chest x-ray. Patient has no external signs of trauma except for abrasion to right forehead. Small laceration that we will repair. Tdap  Updated  LACERATION REPAIR Performed by: Gardiner Sleeper Authorized by: Gardiner Sleeper Consent: Verbal consent obtained. Risks and benefits: risks, benefits and alternatives were discussed Consent given by: patient Patient identity confirmed: provided demographic data Prepped and Draped in normal sterile fashion Wound explored  Laceration Location: right forehead  Laceration Length: 3cm  No Foreign Bodies seen or palpated  Anesthesia: local infiltration  Local anesthetic: lidocaine 1 % with epinephrine  Anesthetic total: 4 ml  Irrigation method: syringe Amount of cleaning: standard  Skin closure:    Number of sutures: 3   Technique: simple uninteruppted.  Patient  tolerance: Patient tolerated the procedure well with no immediate complications.   Courteney Julio Alm, MD 10/26/14 1355

## 2014-10-26 NOTE — ED Notes (Signed)
Pt ambulated with one staff assist in the hallway. Pt is baseline walking per self and wife.

## 2014-10-26 NOTE — Discharge Instructions (Signed)
Please go to regular doctor to get your stitches taken out in 5 days. Please return with any symptoms or any increasing falls. Fall Prevention and Home Safety Falls cause injuries and can affect all age groups. It is possible to use preventive measures to significantly decrease the likelihood of falls. There are many simple measures which can make your home safer and prevent falls. OUTDOORS  Repair cracks and edges of walkways and driveways.  Remove high doorway thresholds.  Trim shrubbery on the main path into your home.  Have good outside lighting.  Clear walkways of tools, rocks, debris, and clutter.  Check that handrails are not broken and are securely fastened. Both sides of steps should have handrails.  Have leaves, snow, and ice cleared regularly.  Use sand or salt on walkways during winter months.  In the garage, clean up grease or oil spills. BATHROOM  Install night lights.  Install grab bars by the toilet and in the tub and shower.  Use non-skid mats or decals in the tub or shower.  Place a plastic non-slip stool in the shower to sit on, if needed.  Keep floors dry and clean up all water on the floor immediately.  Remove soap buildup in the tub or shower on a regular basis.  Secure bath mats with non-slip, double-sided rug tape.  Remove throw rugs and tripping hazards from the floors. BEDROOMS  Install night lights.  Make sure a bedside light is easy to reach.  Do not use oversized bedding.  Keep a telephone by your bedside.  Have a firm chair with side arms to use for getting dressed.  Remove throw rugs and tripping hazards from the floor. KITCHEN  Keep handles on pots and pans turned toward the center of the stove. Use back burners when possible.  Clean up spills quickly and allow time for drying.  Avoid walking on wet floors.  Avoid hot utensils and knives.  Position shelves so they are not too high or low.  Place commonly used objects  within easy reach.  If necessary, use a sturdy step stool with a grab bar when reaching.  Keep electrical cables out of the way.  Do not use floor polish or wax that makes floors slippery. If you must use wax, use non-skid floor wax.  Remove throw rugs and tripping hazards from the floor. STAIRWAYS  Never leave objects on stairs.  Place handrails on both sides of stairways and use them. Fix any loose handrails. Make sure handrails on both sides of the stairways are as long as the stairs.  Check carpeting to make sure it is firmly attached along stairs. Make repairs to worn or loose carpet promptly.  Avoid placing throw rugs at the top or bottom of stairways, or properly secure the rug with carpet tape to prevent slippage. Get rid of throw rugs, if possible.  Have an electrician put in a light switch at the top and bottom of the stairs. OTHER FALL PREVENTION TIPS  Wear low-heel or rubber-soled shoes that are supportive and fit well. Wear closed toe shoes.  When using a stepladder, make sure it is fully opened and both spreaders are firmly locked. Do not climb a closed stepladder.  Add color or contrast paint or tape to grab bars and handrails in your home. Place contrasting color strips on first and last steps.  Learn and use mobility aids as needed. Install an electrical emergency response system.  Turn on lights to avoid dark areas. Replace light  bulbs that burn out immediately. Get light switches that glow.  Arrange furniture to create clear pathways. Keep furniture in the same place.  Firmly attach carpet with non-skid or double-sided tape.  Eliminate uneven floor surfaces.  Select a carpet pattern that does not visually hide the edge of steps.  Be aware of all pets. OTHER HOME SAFETY TIPS  Set the water temperature for 120 F (48.8 C).  Keep emergency numbers on or near the telephone.  Keep smoke detectors on every level of the home and near sleeping  areas. Document Released: 02/28/2002 Document Revised: 09/09/2011 Document Reviewed: 05/30/2011 [facility] Patient Information 2015 [place], [place]. This information is not intended to replace advice given to you by your health care provider. Make sure you discuss any questions you have with your health care provider. Please go to regular doctor to get your stitches taken out in 5 days. Please return with any symptoms or any increasing falls.

## 2014-10-26 NOTE — ED Notes (Signed)
Pt states he fell stumbled while getting out of his pick-up and fell, hitting his head.  No loc.  No altered behavior.  Bleeding controlled.  Pt is on warfarin for previous strokes.  States he often stumbles d/t deficits from previous strokes.

## 2014-10-26 NOTE — ED Notes (Signed)
PA student at the bedside to suture pts wound.

## 2014-11-01 DIAGNOSIS — Z4802 Encounter for removal of sutures: Secondary | ICD-10-CM | POA: Diagnosis not present

## 2014-11-14 DIAGNOSIS — Z7901 Long term (current) use of anticoagulants: Secondary | ICD-10-CM | POA: Diagnosis not present

## 2014-11-14 DIAGNOSIS — Z86718 Personal history of other venous thrombosis and embolism: Secondary | ICD-10-CM | POA: Diagnosis not present

## 2014-11-28 DIAGNOSIS — Z86718 Personal history of other venous thrombosis and embolism: Secondary | ICD-10-CM | POA: Diagnosis not present

## 2014-11-28 DIAGNOSIS — Z7901 Long term (current) use of anticoagulants: Secondary | ICD-10-CM | POA: Diagnosis not present

## 2014-12-28 DIAGNOSIS — Z86718 Personal history of other venous thrombosis and embolism: Secondary | ICD-10-CM | POA: Diagnosis not present

## 2014-12-28 DIAGNOSIS — Z7901 Long term (current) use of anticoagulants: Secondary | ICD-10-CM | POA: Diagnosis not present

## 2015-01-04 DIAGNOSIS — Z961 Presence of intraocular lens: Secondary | ICD-10-CM | POA: Diagnosis not present

## 2015-01-25 DIAGNOSIS — Z86718 Personal history of other venous thrombosis and embolism: Secondary | ICD-10-CM | POA: Diagnosis not present

## 2015-01-25 DIAGNOSIS — Z7901 Long term (current) use of anticoagulants: Secondary | ICD-10-CM | POA: Diagnosis not present

## 2015-02-22 DIAGNOSIS — Z86718 Personal history of other venous thrombosis and embolism: Secondary | ICD-10-CM | POA: Diagnosis not present

## 2015-02-22 DIAGNOSIS — Z7901 Long term (current) use of anticoagulants: Secondary | ICD-10-CM | POA: Diagnosis not present

## 2015-02-28 DIAGNOSIS — Z0000 Encounter for general adult medical examination without abnormal findings: Secondary | ICD-10-CM | POA: Diagnosis not present

## 2015-02-28 DIAGNOSIS — Z125 Encounter for screening for malignant neoplasm of prostate: Secondary | ICD-10-CM | POA: Diagnosis not present

## 2015-02-28 DIAGNOSIS — E538 Deficiency of other specified B group vitamins: Secondary | ICD-10-CM | POA: Diagnosis not present

## 2015-02-28 DIAGNOSIS — I10 Essential (primary) hypertension: Secondary | ICD-10-CM | POA: Diagnosis not present

## 2015-03-07 DIAGNOSIS — E7800 Pure hypercholesterolemia, unspecified: Secondary | ICD-10-CM | POA: Diagnosis not present

## 2015-03-07 DIAGNOSIS — K219 Gastro-esophageal reflux disease without esophagitis: Secondary | ICD-10-CM | POA: Diagnosis not present

## 2015-03-07 DIAGNOSIS — Z0000 Encounter for general adult medical examination without abnormal findings: Secondary | ICD-10-CM | POA: Diagnosis not present

## 2015-03-07 DIAGNOSIS — I10 Essential (primary) hypertension: Secondary | ICD-10-CM | POA: Diagnosis not present

## 2015-03-27 DIAGNOSIS — Z7901 Long term (current) use of anticoagulants: Secondary | ICD-10-CM | POA: Diagnosis not present

## 2015-03-27 DIAGNOSIS — Z86718 Personal history of other venous thrombosis and embolism: Secondary | ICD-10-CM | POA: Diagnosis not present

## 2015-04-24 DIAGNOSIS — Z86718 Personal history of other venous thrombosis and embolism: Secondary | ICD-10-CM | POA: Diagnosis not present

## 2015-04-24 DIAGNOSIS — Z7901 Long term (current) use of anticoagulants: Secondary | ICD-10-CM | POA: Diagnosis not present

## 2015-05-22 DIAGNOSIS — L570 Actinic keratosis: Secondary | ICD-10-CM | POA: Diagnosis not present

## 2015-05-22 DIAGNOSIS — C44622 Squamous cell carcinoma of skin of right upper limb, including shoulder: Secondary | ICD-10-CM | POA: Diagnosis not present

## 2015-05-22 DIAGNOSIS — X32XXXD Exposure to sunlight, subsequent encounter: Secondary | ICD-10-CM | POA: Diagnosis not present

## 2015-05-24 DIAGNOSIS — Z7901 Long term (current) use of anticoagulants: Secondary | ICD-10-CM | POA: Diagnosis not present

## 2015-05-24 DIAGNOSIS — Z86718 Personal history of other venous thrombosis and embolism: Secondary | ICD-10-CM | POA: Diagnosis not present

## 2015-06-21 DIAGNOSIS — Z7901 Long term (current) use of anticoagulants: Secondary | ICD-10-CM | POA: Diagnosis not present

## 2015-06-21 DIAGNOSIS — Z86718 Personal history of other venous thrombosis and embolism: Secondary | ICD-10-CM | POA: Diagnosis not present

## 2015-07-10 DIAGNOSIS — C44629 Squamous cell carcinoma of skin of left upper limb, including shoulder: Secondary | ICD-10-CM | POA: Diagnosis not present

## 2015-07-10 DIAGNOSIS — D044 Carcinoma in situ of skin of scalp and neck: Secondary | ICD-10-CM | POA: Diagnosis not present

## 2015-07-10 DIAGNOSIS — L570 Actinic keratosis: Secondary | ICD-10-CM | POA: Diagnosis not present

## 2015-07-10 DIAGNOSIS — X32XXXD Exposure to sunlight, subsequent encounter: Secondary | ICD-10-CM | POA: Diagnosis not present

## 2015-07-19 DIAGNOSIS — Z7901 Long term (current) use of anticoagulants: Secondary | ICD-10-CM | POA: Diagnosis not present

## 2015-07-19 DIAGNOSIS — Z86718 Personal history of other venous thrombosis and embolism: Secondary | ICD-10-CM | POA: Diagnosis not present

## 2015-08-09 DIAGNOSIS — G629 Polyneuropathy, unspecified: Secondary | ICD-10-CM | POA: Diagnosis not present

## 2015-08-16 DIAGNOSIS — M25551 Pain in right hip: Secondary | ICD-10-CM | POA: Diagnosis not present

## 2015-08-16 DIAGNOSIS — M25559 Pain in unspecified hip: Secondary | ICD-10-CM | POA: Diagnosis not present

## 2015-08-21 DIAGNOSIS — Z08 Encounter for follow-up examination after completed treatment for malignant neoplasm: Secondary | ICD-10-CM | POA: Diagnosis not present

## 2015-08-21 DIAGNOSIS — X32XXXD Exposure to sunlight, subsequent encounter: Secondary | ICD-10-CM | POA: Diagnosis not present

## 2015-08-21 DIAGNOSIS — L570 Actinic keratosis: Secondary | ICD-10-CM | POA: Diagnosis not present

## 2015-08-21 DIAGNOSIS — D0461 Carcinoma in situ of skin of right upper limb, including shoulder: Secondary | ICD-10-CM | POA: Diagnosis not present

## 2015-08-21 DIAGNOSIS — Z85828 Personal history of other malignant neoplasm of skin: Secondary | ICD-10-CM | POA: Diagnosis not present

## 2015-08-23 DIAGNOSIS — M47817 Spondylosis without myelopathy or radiculopathy, lumbosacral region: Secondary | ICD-10-CM | POA: Diagnosis not present

## 2015-08-23 DIAGNOSIS — M25559 Pain in unspecified hip: Secondary | ICD-10-CM | POA: Diagnosis not present

## 2015-08-23 DIAGNOSIS — M5136 Other intervertebral disc degeneration, lumbar region: Secondary | ICD-10-CM | POA: Diagnosis not present

## 2015-08-23 DIAGNOSIS — Z86718 Personal history of other venous thrombosis and embolism: Secondary | ICD-10-CM | POA: Diagnosis not present

## 2015-08-23 DIAGNOSIS — Z7901 Long term (current) use of anticoagulants: Secondary | ICD-10-CM | POA: Diagnosis not present

## 2015-08-24 ENCOUNTER — Other Ambulatory Visit: Payer: Self-pay | Admitting: Sports Medicine

## 2015-08-24 DIAGNOSIS — Z139 Encounter for screening, unspecified: Secondary | ICD-10-CM

## 2015-08-28 ENCOUNTER — Encounter: Payer: Self-pay | Admitting: Neurology

## 2015-08-28 ENCOUNTER — Ambulatory Visit (INDEPENDENT_AMBULATORY_CARE_PROVIDER_SITE_OTHER): Payer: Medicare Other | Admitting: Neurology

## 2015-08-28 VITALS — BP 125/72 | HR 61

## 2015-08-28 DIAGNOSIS — M542 Cervicalgia: Secondary | ICD-10-CM | POA: Diagnosis not present

## 2015-08-28 DIAGNOSIS — M5441 Lumbago with sciatica, right side: Secondary | ICD-10-CM | POA: Diagnosis not present

## 2015-08-28 DIAGNOSIS — R269 Unspecified abnormalities of gait and mobility: Secondary | ICD-10-CM | POA: Insufficient documentation

## 2015-08-28 DIAGNOSIS — S0990XS Unspecified injury of head, sequela: Secondary | ICD-10-CM

## 2015-08-28 DIAGNOSIS — M545 Low back pain, unspecified: Secondary | ICD-10-CM | POA: Insufficient documentation

## 2015-08-28 DIAGNOSIS — I6300 Cerebral infarction due to thrombosis of unspecified precerebral artery: Secondary | ICD-10-CM | POA: Diagnosis not present

## 2015-08-28 DIAGNOSIS — S0990XA Unspecified injury of head, initial encounter: Secondary | ICD-10-CM | POA: Insufficient documentation

## 2015-08-28 DIAGNOSIS — I639 Cerebral infarction, unspecified: Secondary | ICD-10-CM | POA: Insufficient documentation
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
## 2015-08-30 ENCOUNTER — Ambulatory Visit
Admission: RE | Admit: 2015-08-30 | Discharge: 2015-08-30 | Disposition: A | Discharge status: Home / Self Care | Payer: Medicare Other | Source: Ambulatory Visit | Attending: Sports Medicine | Admitting: Sports Medicine

## 2015-08-30 DIAGNOSIS — Z720 Tobacco use: Secondary | ICD-10-CM | POA: Diagnosis not present

## 2015-08-30 DIAGNOSIS — Z139 Encounter for screening, unspecified: Secondary | ICD-10-CM

## 2015-08-30 DIAGNOSIS — Z136 Encounter for screening for cardiovascular disorders: Secondary | ICD-10-CM | POA: Diagnosis not present

## 2015-08-31 ENCOUNTER — Other Ambulatory Visit: Payer: Medicare Other

## 2015-08-31 DIAGNOSIS — M542 Cervicalgia: Secondary | ICD-10-CM | POA: Diagnosis not present

## 2015-08-31 DIAGNOSIS — Z79891 Long term (current) use of opiate analgesic: Secondary | ICD-10-CM | POA: Diagnosis not present

## 2015-08-31 DIAGNOSIS — F329 Major depressive disorder, single episode, unspecified: Secondary | ICD-10-CM | POA: Diagnosis not present

## 2015-08-31 DIAGNOSIS — I10 Essential (primary) hypertension: Secondary | ICD-10-CM | POA: Diagnosis not present

## 2015-08-31 DIAGNOSIS — F419 Anxiety disorder, unspecified: Secondary | ICD-10-CM | POA: Diagnosis not present

## 2015-08-31 DIAGNOSIS — E7800 Pure hypercholesterolemia, unspecified: Secondary | ICD-10-CM | POA: Diagnosis not present

## 2015-08-31 DIAGNOSIS — R269 Unspecified abnormalities of gait and mobility: Secondary | ICD-10-CM | POA: Diagnosis not present

## 2015-08-31 DIAGNOSIS — Z8673 Personal history of transient ischemic attack (TIA), and cerebral infarction without residual deficits: Secondary | ICD-10-CM | POA: Diagnosis not present

## 2015-08-31 DIAGNOSIS — Z7901 Long term (current) use of anticoagulants: Secondary | ICD-10-CM | POA: Diagnosis not present

## 2015-08-31 DIAGNOSIS — G8929 Other chronic pain: Secondary | ICD-10-CM | POA: Diagnosis not present

## 2015-08-31 DIAGNOSIS — Z87820 Personal history of traumatic brain injury: Secondary | ICD-10-CM | POA: Diagnosis not present

## 2015-08-31 DIAGNOSIS — Z86711 Personal history of pulmonary embolism: Secondary | ICD-10-CM | POA: Diagnosis not present

## 2015-08-31 DIAGNOSIS — M545 Low back pain: Secondary | ICD-10-CM | POA: Diagnosis not present

## 2015-08-31 DIAGNOSIS — Z96641 Presence of right artificial hip joint: Secondary | ICD-10-CM | POA: Diagnosis not present

## 2015-08-31 DIAGNOSIS — Z86718 Personal history of other venous thrombosis and embolism: Secondary | ICD-10-CM | POA: Diagnosis not present

## 2015-09-04 DIAGNOSIS — M542 Cervicalgia: Secondary | ICD-10-CM | POA: Diagnosis not present

## 2015-09-04 DIAGNOSIS — R269 Unspecified abnormalities of gait and mobility: Secondary | ICD-10-CM | POA: Diagnosis not present

## 2015-09-04 DIAGNOSIS — M5441 Lumbago with sciatica, right side: Secondary | ICD-10-CM | POA: Diagnosis not present

## 2015-09-04 DIAGNOSIS — S0990XS Unspecified injury of head, sequela: Secondary | ICD-10-CM | POA: Diagnosis not present

## 2015-09-04 DIAGNOSIS — I6300 Cerebral infarction due to thrombosis of unspecified precerebral artery: Secondary | ICD-10-CM | POA: Diagnosis not present

## 2015-09-05 DIAGNOSIS — Z8673 Personal history of transient ischemic attack (TIA), and cerebral infarction without residual deficits: Secondary | ICD-10-CM | POA: Diagnosis not present

## 2015-09-05 DIAGNOSIS — I10 Essential (primary) hypertension: Secondary | ICD-10-CM | POA: Diagnosis not present

## 2015-09-05 DIAGNOSIS — Z96641 Presence of right artificial hip joint: Secondary | ICD-10-CM | POA: Diagnosis not present

## 2015-09-05 DIAGNOSIS — Z7901 Long term (current) use of anticoagulants: Secondary | ICD-10-CM | POA: Diagnosis not present

## 2015-09-05 DIAGNOSIS — Z86718 Personal history of other venous thrombosis and embolism: Secondary | ICD-10-CM | POA: Diagnosis not present

## 2015-09-05 DIAGNOSIS — F329 Major depressive disorder, single episode, unspecified: Secondary | ICD-10-CM | POA: Diagnosis not present

## 2015-09-05 DIAGNOSIS — G8929 Other chronic pain: Secondary | ICD-10-CM | POA: Diagnosis not present

## 2015-09-05 DIAGNOSIS — M542 Cervicalgia: Secondary | ICD-10-CM | POA: Diagnosis not present

## 2015-09-05 DIAGNOSIS — R269 Unspecified abnormalities of gait and mobility: Secondary | ICD-10-CM | POA: Diagnosis not present

## 2015-09-05 DIAGNOSIS — Z87820 Personal history of traumatic brain injury: Secondary | ICD-10-CM | POA: Diagnosis not present

## 2015-09-05 DIAGNOSIS — Z79891 Long term (current) use of opiate analgesic: Secondary | ICD-10-CM | POA: Diagnosis not present

## 2015-09-05 DIAGNOSIS — F419 Anxiety disorder, unspecified: Secondary | ICD-10-CM | POA: Diagnosis not present

## 2015-09-05 DIAGNOSIS — Z86711 Personal history of pulmonary embolism: Secondary | ICD-10-CM | POA: Diagnosis not present

## 2015-09-05 DIAGNOSIS — M545 Low back pain: Secondary | ICD-10-CM | POA: Diagnosis not present

## 2015-09-07 DIAGNOSIS — M542 Cervicalgia: Secondary | ICD-10-CM | POA: Diagnosis not present

## 2015-09-07 DIAGNOSIS — Z87820 Personal history of traumatic brain injury: Secondary | ICD-10-CM | POA: Diagnosis not present

## 2015-09-07 DIAGNOSIS — Z96641 Presence of right artificial hip joint: Secondary | ICD-10-CM | POA: Diagnosis not present

## 2015-09-07 DIAGNOSIS — Z8673 Personal history of transient ischemic attack (TIA), and cerebral infarction without residual deficits: Secondary | ICD-10-CM | POA: Diagnosis not present

## 2015-09-07 DIAGNOSIS — M545 Low back pain: Secondary | ICD-10-CM | POA: Diagnosis not present

## 2015-09-07 DIAGNOSIS — Z86711 Personal history of pulmonary embolism: Secondary | ICD-10-CM | POA: Diagnosis not present

## 2015-09-07 DIAGNOSIS — Z7901 Long term (current) use of anticoagulants: Secondary | ICD-10-CM | POA: Diagnosis not present

## 2015-09-07 DIAGNOSIS — F419 Anxiety disorder, unspecified: Secondary | ICD-10-CM | POA: Diagnosis not present

## 2015-09-07 DIAGNOSIS — R269 Unspecified abnormalities of gait and mobility: Secondary | ICD-10-CM | POA: Diagnosis not present

## 2015-09-07 DIAGNOSIS — I10 Essential (primary) hypertension: Secondary | ICD-10-CM | POA: Diagnosis not present

## 2015-09-07 DIAGNOSIS — Z79891 Long term (current) use of opiate analgesic: Secondary | ICD-10-CM | POA: Diagnosis not present

## 2015-09-07 DIAGNOSIS — Z86718 Personal history of other venous thrombosis and embolism: Secondary | ICD-10-CM | POA: Diagnosis not present

## 2015-09-07 DIAGNOSIS — G8929 Other chronic pain: Secondary | ICD-10-CM | POA: Diagnosis not present

## 2015-09-07 DIAGNOSIS — F329 Major depressive disorder, single episode, unspecified: Secondary | ICD-10-CM | POA: Diagnosis not present

## 2015-09-10 ENCOUNTER — Telehealth: Payer: Self-pay | Admitting: Neurology

## 2015-09-10 ENCOUNTER — Ambulatory Visit (INDEPENDENT_AMBULATORY_CARE_PROVIDER_SITE_OTHER): Payer: Self-pay

## 2015-09-10 DIAGNOSIS — R269 Unspecified abnormalities of gait and mobility: Secondary | ICD-10-CM

## 2015-09-10 DIAGNOSIS — M5441 Lumbago with sciatica, right side: Secondary | ICD-10-CM | POA: Diagnosis not present

## 2015-09-10 DIAGNOSIS — I6300 Cerebral infarction due to thrombosis of unspecified precerebral artery: Secondary | ICD-10-CM

## 2015-09-10 DIAGNOSIS — S0990XS Unspecified injury of head, sequela: Secondary | ICD-10-CM

## 2015-09-10 DIAGNOSIS — Z0289 Encounter for other administrative examinations: Secondary | ICD-10-CM

## 2015-09-10 DIAGNOSIS — M542 Cervicalgia: Secondary | ICD-10-CM | POA: Diagnosis not present

## 2015-09-10 NOTE — Telephone Encounter (Signed)
Steven Guerrero [phone] called regarding results of MRI. States very, very painful on right hip. Asks if weight bearing status has changed?

## 2015-09-10 NOTE — Telephone Encounter (Signed)
Please call patient, MRI of the cervical and lumbar spine showed multilevel degenerative changes, but there was no significant canal or foraminal stenosis.  I am not sure his complains of severe right hip pain is related to the MRI findings, I can order x-ray of right hip to rule out the right hip pathology, better option would be let orthopedic surgeon who did his right hip replacement know that he has severe right hip pain

## 2015-09-10 NOTE — Telephone Encounter (Signed)
Patient said that he hasn't got his results back from his MRI and wants someone to call him and give him an update on what is going on. The best number to contact patient is [phone]

## 2015-09-11 NOTE — Telephone Encounter (Signed)
Returned call to [facility] - Dr. Krista Blue has advised patient to follow up with the ortho surgeon who performed his right hip replacement - patient has a pending appt.

## 2015-09-11 NOTE — Telephone Encounter (Signed)
Spoke to patient's wife on HIPPA - reviewed results.  He has a pending appt with this ortho MD.  He will also keep his 09/12/15 NCV/EMG appt w/ Dr. Krista Blue.

## 2015-09-12 ENCOUNTER — Ambulatory Visit (INDEPENDENT_AMBULATORY_CARE_PROVIDER_SITE_OTHER): Payer: Medicare Other | Admitting: Neurology

## 2015-09-12 ENCOUNTER — Ambulatory Visit (INDEPENDENT_AMBULATORY_CARE_PROVIDER_SITE_OTHER): Payer: Self-pay | Admitting: Neurology

## 2015-09-12 DIAGNOSIS — R269 Unspecified abnormalities of gait and mobility: Secondary | ICD-10-CM | POA: Diagnosis not present

## 2015-09-12 DIAGNOSIS — I10 Essential (primary) hypertension: Secondary | ICD-10-CM | POA: Diagnosis not present

## 2015-09-12 DIAGNOSIS — M25551 Pain in right hip: Secondary | ICD-10-CM | POA: Diagnosis not present

## 2015-09-12 DIAGNOSIS — M5431 Sciatica, right side: Secondary | ICD-10-CM | POA: Diagnosis not present

## 2015-09-12 DIAGNOSIS — M5441 Lumbago with sciatica, right side: Secondary | ICD-10-CM

## 2015-09-12 DIAGNOSIS — Z8673 Personal history of transient ischemic attack (TIA), and cerebral infarction without residual deficits: Secondary | ICD-10-CM | POA: Diagnosis not present

## 2015-09-12 DIAGNOSIS — M542 Cervicalgia: Secondary | ICD-10-CM

## 2015-09-12 DIAGNOSIS — I6300 Cerebral infarction due to thrombosis of unspecified precerebral artery: Secondary | ICD-10-CM

## 2015-09-12 DIAGNOSIS — S0990XS Unspecified injury of head, sequela: Secondary | ICD-10-CM

## 2015-09-12 DIAGNOSIS — E7800 Pure hypercholesterolemia, unspecified: Secondary | ICD-10-CM | POA: Diagnosis not present
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
## 2015-09-13 NOTE — Procedures (Signed)
   NCS (NERVE CONDUCTION STUDY) WITH EMG (ELECTROMYOGRAPHY) REPORT   STUDY DATE: September 13 2015 PATIENT NAME: Steven Guerrero DOB: 07-19-1940 MRN: [id number]    TECHNOLOGIST: Laretta Alstrom  ELECTROMYOGRAPHER: Marcial Pacas M.D.  CLINICAL INFORMATION:  75 year old male with history of right femoral fracture, status post right hip replacement, presented with worsening right hip pain, gait abnormality, and right-sided low back pain.  FINDINGS: NERVE CONDUCTION STUDY: Bilateral peroneal sensory responses were normal. Bilateral peroneal to EDB and tibial motor responses were normal and symmetric. Bilateral tibial H reflexes were normal and and symmetric.  NEEDLE ELECTROMYOGRAPHY: Selective needle examinations were performed at bilateral lower extremity muscles, bilateral lumbar sacral paraspinal muscles.  Bilateral tibialis anterior, peroneal longus, medial gastrocnemius: Increased insertional activity, no spontaneous activity, mildly enlarged motor unit potential with mildly decreased recruitment patterns.  Bilateral vastus lateralis, right biceps femoris long head: Normal insertion activity, no spontaneous activity, normal morphology motor unit potential was normal recruitment patterns.  Right gluteus medius, increased insertional activity, 2 plus spontaneous activity, this is about right hip replacement surgical incision site, could due to previous surgical damage.  There was no evidence of spontaneous activity at bilateral lumbosacral paraspinal muscles, bilateral L4-5 S1.  IMPRESSION:   This is a mild abnormal study. There is electrodiagnostic evidence of mild chronic lumbosacral radiculopathy, there is no evidence of active process. There is no evidence of large fiber peripheral neuropathy.    INTERPRETING PHYSICIAN:   Marcial Pacas M.D. Ph.D. [facility] [address], [place], [place] [address] [phone]

## 2015-09-20 DIAGNOSIS — M47817 Spondylosis without myelopathy or radiculopathy, lumbosacral region: Secondary | ICD-10-CM | POA: Diagnosis not present

## 2015-09-26 ENCOUNTER — Ambulatory Visit
Admission: RE | Admit: 2015-09-26 | Discharge: 2015-09-26 | Disposition: A | Discharge status: Home / Self Care | Payer: Medicare Other | Source: Ambulatory Visit | Attending: Neurology | Admitting: Neurology

## 2015-09-26 ENCOUNTER — Telehealth: Payer: Self-pay | Admitting: Neurology

## 2015-09-26 ENCOUNTER — Inpatient Hospital Stay: Admission: RE | Admit: 2015-09-26 | Payer: Medicare Other | Source: Ambulatory Visit

## 2015-09-26 DIAGNOSIS — M25551 Pain in right hip: Secondary | ICD-10-CM | POA: Diagnosis not present

## 2015-09-26 NOTE — Telephone Encounter (Signed)
Patient and wife aware of results.

## 2015-09-26 NOTE — Telephone Encounter (Signed)
Please call patient MRI of right hip showed post operative changes, no acute abnormalities.    1. Right total hip arthroplasty without osteolysis or periarticular fluid collection. 2. Mild osteoarthritis of the left hip.

## 2015-09-27 DIAGNOSIS — Z86718 Personal history of other venous thrombosis and embolism: Secondary | ICD-10-CM | POA: Diagnosis not present

## 2015-09-27 DIAGNOSIS — Z7901 Long term (current) use of anticoagulants: Secondary | ICD-10-CM | POA: Diagnosis not present

## 2015-10-03 DIAGNOSIS — L570 Actinic keratosis: Secondary | ICD-10-CM | POA: Diagnosis not present

## 2015-10-03 DIAGNOSIS — X32XXXD Exposure to sunlight, subsequent encounter: Secondary | ICD-10-CM | POA: Diagnosis not present

## 2015-10-03 DIAGNOSIS — Z85828 Personal history of other malignant neoplasm of skin: Secondary | ICD-10-CM | POA: Diagnosis not present

## 2015-10-03 DIAGNOSIS — Z08 Encounter for follow-up examination after completed treatment for malignant neoplasm: Secondary | ICD-10-CM | POA: Diagnosis not present

## 2015-10-09 ENCOUNTER — Ambulatory Visit: Payer: Medicare Other | Admitting: Neurology

## 2015-10-18 DIAGNOSIS — M47817 Spondylosis without myelopathy or radiculopathy, lumbosacral region: Secondary | ICD-10-CM | POA: Diagnosis not present

## 2015-10-18 DIAGNOSIS — M5136 Other intervertebral disc degeneration, lumbar region: Secondary | ICD-10-CM | POA: Diagnosis not present

## 2015-10-24 ENCOUNTER — Ambulatory Visit: Payer: Medicare Other | Attending: Sports Medicine | Admitting: Physical Therapy

## 2015-10-24 ENCOUNTER — Encounter: Payer: Self-pay | Admitting: Physical Therapy

## 2015-10-24 DIAGNOSIS — M6281 Muscle weakness (generalized): Secondary | ICD-10-CM | POA: Diagnosis not present

## 2015-10-24 DIAGNOSIS — R262 Difficulty in walking, not elsewhere classified: Secondary | ICD-10-CM | POA: Insufficient documentation

## 2015-10-24 NOTE — Patient Instructions (Signed)
Access Code: [email or web address]  URL: [email or web address]  Date: 10/24/2015  Prepared by: Selinda Eon   Exercises  Sidestepping - 10 reps - 2 sets - 2x daily - 7x weekly , back to wall Water walking: fwd, retro, R/L Heel toe walking

## 2015-10-24 NOTE — Therapy (Addendum)
[place], [place], [address] Phone: [phone]   Fax:  [phone]  Physical Therapy Evaluation/Discharge Summary Patient Details  Name: Steven Guerrero MRN: [id number] Date of Birth: 10/20/1940 Referring Provider: Gerda Diss, DO  Encounter Date: 10/24/2015      PT End of Session - 10/24/15 1628    Visit Number 1   Number of Visits 13   Date for PT Re-Evaluation 11/21/15  MCR 30 day re eval   Authorization Type UHC MCR   PT Start Time 1630   PT Stop Time 1713   PT Time Calculation (min) 43 min   Activity Tolerance Patient tolerated treatment well   Behavior During Therapy [facility] for tasks assessed/performed      Past Medical History:  Diagnosis Date  . Anxiety and depression   . Back pain   . Brain bleed ([place])    2003  . CVA (cerebral infarction)    2001  . Hypercholesteremia   . Hypertension   . Peripheral neuropathy ([place])   . Pulmonary embolism ([place])   . Sleep apnea     Past Surgical History:  Procedure Laterality Date  . HERNIA REPAIR    . LEG SURGERY     Broken femur  . TOTAL HIP ARTHROPLASTY     Right    There were no vitals filed for this visit.       Subjective Assessment - 10/24/15 1632    Subjective Began feeling hip pain about a month ago. Has been taking medication prescribed by MD and denies pain at this time. Ambulates with RW in community, occasionally at home.    How long can you stand comfortably? 5-10 min   How long can you walk comfortably? 10-15 min   Patient Stated Goals walking better (increased time)   Currently in Pain? No/denies  no pain in last week            [facility] PT Assessment - 10/24/15 0001      Assessment   Medical Diagnosis R G.trochanteric tendinopathy, R THA, R periprosthetic fracture   Referring Provider Gerda Diss, DO   Onset Date/Surgical Date --  1 month ago   Hand Dominance Right   Next MD Visit --  1-2 months   Prior  Therapy no     Precautions   Precautions Fall     Restrictions   Weight Bearing Restrictions No     Balance Screen   Has the patient fallen in the past 6 months No     [place] residence   Living Arrangements Spouse/significant other   Home Access Level entry     Prior Function   Level of [place]     Cognition   Overall Cognitive Status Within Functional Limits for tasks assessed     Observation/Other Assessments   Other Surveys  Other Surveys     Posture/Postural Control   Posture Comments increased lumbar lordosis with anterior pelvic tilt     ROM / Strength   AROM / PROM / Strength Strength     Strength   Strength Assessment Site Hip;Knee   Right/Left Hip Right;Left   Right Hip Flexion 4/5   Right Hip Extension 4-/5   Right Hip ABduction 4-/5   Left Hip Flexion 5/5   Left Hip Extension 4-/5   Right/Left Knee Right;Left   Right Knee Flexion 4+/5   Right Knee Extension 4/5   Left  improve ambulation endurance in daily activities by 9/15   Time 6   Period Weeks   Status New     PT LONG TERM GOAL #2   Title BERG scale to at least 37/56 (8 point [place])   Baseline 29/56 at eval   Time 3   Period Weeks   Status New     PT LONG TERM GOAL #3   Title Pt will be able to navigate single step/curb with RW to safely ambulate through the community   Baseline assistance to keep from falling with step tap at eval   Time 6   Period Weeks   Status New               Plan - 10/24/15 1732    Clinical Impression Statement Pt presents to physical therapy with complaints of difficulty walking. Was referred for hip pain but denies any pain in his hip in the last week (notable discomfort in R SLS during testing). Pt scored 29/56 on BERG indicating need for skilled training to decrease fall risk. pt will also benefit from gait training to improve endurance and strength in ambulation   Rehab Potential Fair   PT Frequency 2x / week   PT Duration 6 weeks   PT Treatment/Interventions ADLs/Self Care Home Management;Cryotherapy;Stair training;Gait training;DME Instruction;Moist Heat;Therapeutic activities;Therapeutic exercise;Balance training;Neuromuscular re-education;Patient/family education;Passive range of motion;Scar mobilization;Manual techniques;Taping   PT Next Visit Plan gait training, nu step, balance training   PT Home Exercise Plan water walking, side stepping against wall, heel toe gait   Consulted and Agree with Plan of Care Patient      Patient will benefit from skilled therapeutic intervention in order to improve the following deficits and impairments:  Abnormal gait, Difficulty walking, Decreased activity tolerance, Improper body mechanics, Decreased strength, Decreased balance  Visit Diagnosis: Difficulty in walking, not elsewhere classified - Plan: PT plan of care cert/re-cert  Muscle weakness (generalized) - Plan: PT plan  of care cert/re-cert      G-Codes - 27/78/24 1740    Functional Assessment Tool Used BERG 29/56 (49% disability), clinical judgement   Functional Limitation Mobility: Walking and moving around   Mobility: Walking and Moving Around Current Status (M3536) At least 40 percent but less than 60 percent impaired, limited or restricted   Mobility: Walking and Moving Around Goal Status [phone]) At least 20 percent but less than 40 percent impaired, limited or restricted       Problem List Patient Active Problem List   Diagnosis Date Noted  . Right hip pain 09/12/2015  . Abnormality of gait 08/28/2015  . Head trauma 08/28/2015  . Stroke ([place]) 08/28/2015  . Neck pain 08/28/2015  . Low back pain 08/28/2015   Symon [place] C. Liset Mcmonigle PT, DPT 10/24/15 5:43 PM   [facility] [address] [place], [place], [address] Phone: [phone]   Fax:  [phone]  Name: Steven Guerrero MRN: [id number] Date of Birth: 10-28-1940  PHYSICAL THERAPY DISCHARGE SUMMARY  Visits from Start of Care: 1  Current functional level related to goals / functional outcomes: See above   Remaining deficits: See above   Education / Equipment: Anatomy of condition, POC, HEP, exercise form/rationale  Plan: Patient agrees to discharge.  Patient goals were not met. Patient is being discharged due to not returning since the last visit.  ?????  improve ambulation endurance in daily activities by 9/15   Time 6   Period Weeks   Status New     PT LONG TERM GOAL #2   Title BERG scale to at least 37/56 (8 point [place])   Baseline 29/56 at eval   Time 3   Period Weeks   Status New     PT LONG TERM GOAL #3   Title Pt will be able to navigate single step/curb with RW to safely ambulate through the community   Baseline assistance to keep from falling with step tap at eval   Time 6   Period Weeks   Status New               Plan - 10/24/15 1732    Clinical Impression Statement Pt presents to physical therapy with complaints of difficulty walking. Was referred for hip pain but denies any pain in his hip in the last week (notable discomfort in R SLS during testing). Pt scored 29/56 on BERG indicating need for skilled training to decrease fall risk. pt will also benefit from gait training to improve endurance and strength in ambulation   Rehab Potential Fair   PT Frequency 2x / week   PT Duration 6 weeks   PT Treatment/Interventions ADLs/Self Care Home Management;Cryotherapy;Stair training;Gait training;DME Instruction;Moist Heat;Therapeutic activities;Therapeutic exercise;Balance training;Neuromuscular re-education;Patient/family education;Passive range of motion;Scar mobilization;Manual techniques;Taping   PT Next Visit Plan gait training, nu step, balance training   PT Home Exercise Plan water walking, side stepping against wall, heel toe gait   Consulted and Agree with Plan of Care Patient      Patient will benefit from skilled therapeutic intervention in order to improve the following deficits and impairments:  Abnormal gait, Difficulty walking, Decreased activity tolerance, Improper body mechanics, Decreased strength, Decreased balance  Visit Diagnosis: Difficulty in walking, not elsewhere classified - Plan: PT plan of care cert/re-cert  Muscle weakness (generalized) - Plan: PT plan  of care cert/re-cert      G-Codes - 27/78/24 1740    Functional Assessment Tool Used BERG 29/56 (49% disability), clinical judgement   Functional Limitation Mobility: Walking and moving around   Mobility: Walking and Moving Around Current Status (M3536) At least 40 percent but less than 60 percent impaired, limited or restricted   Mobility: Walking and Moving Around Goal Status [phone]) At least 20 percent but less than 40 percent impaired, limited or restricted       Problem List Patient Active Problem List   Diagnosis Date Noted  . Right hip pain 09/12/2015  . Abnormality of gait 08/28/2015  . Head trauma 08/28/2015  . Stroke ([place]) 08/28/2015  . Neck pain 08/28/2015  . Low back pain 08/28/2015   Symon [place] C. Liset Mcmonigle PT, DPT 10/24/15 5:43 PM   [facility] [address] [place], [place], [address] Phone: [phone]   Fax:  [phone]  Name: Steven Guerrero MRN: [id number] Date of Birth: 10-28-1940  PHYSICAL THERAPY DISCHARGE SUMMARY  Visits from Start of Care: 1  Current functional level related to goals / functional outcomes: See above   Remaining deficits: See above   Education / Equipment: Anatomy of condition, POC, HEP, exercise form/rationale  Plan: Patient agrees to discharge.  Patient goals were not met. Patient is being discharged due to not returning since the last visit.  ?????

## 2015-10-29 DIAGNOSIS — Z86718 Personal history of other venous thrombosis and embolism: Secondary | ICD-10-CM | POA: Diagnosis not present

## 2015-10-29 DIAGNOSIS — Z7901 Long term (current) use of anticoagulants: Secondary | ICD-10-CM | POA: Diagnosis not present

## 2015-11-27 DIAGNOSIS — Z86718 Personal history of other venous thrombosis and embolism: Secondary | ICD-10-CM | POA: Diagnosis not present

## 2015-11-27 DIAGNOSIS — Z7901 Long term (current) use of anticoagulants: Secondary | ICD-10-CM | POA: Diagnosis not present

## 2015-12-13 DIAGNOSIS — M5136 Other intervertebral disc degeneration, lumbar region: Secondary | ICD-10-CM | POA: Diagnosis not present

## 2015-12-13 DIAGNOSIS — M47817 Spondylosis without myelopathy or radiculopathy, lumbosacral region: Secondary | ICD-10-CM | POA: Diagnosis not present

## 2015-12-27 DIAGNOSIS — Z86718 Personal history of other venous thrombosis and embolism: Secondary | ICD-10-CM | POA: Diagnosis not present

## 2015-12-27 DIAGNOSIS — Z7901 Long term (current) use of anticoagulants: Secondary | ICD-10-CM | POA: Diagnosis not present

## 2015-12-27 DIAGNOSIS — Z23 Encounter for immunization: Secondary | ICD-10-CM | POA: Diagnosis not present

## 2016-01-31 DIAGNOSIS — Z7901 Long term (current) use of anticoagulants: Secondary | ICD-10-CM | POA: Diagnosis not present

## 2016-01-31 DIAGNOSIS — Z86718 Personal history of other venous thrombosis and embolism: Secondary | ICD-10-CM | POA: Diagnosis not present

## 2016-02-07 ENCOUNTER — Ambulatory Visit (INDEPENDENT_AMBULATORY_CARE_PROVIDER_SITE_OTHER): Payer: Medicare Other | Admitting: Sports Medicine

## 2016-02-08 ENCOUNTER — Ambulatory Visit (INDEPENDENT_AMBULATORY_CARE_PROVIDER_SITE_OTHER): Payer: Medicare Other | Admitting: Sports Medicine

## 2016-02-08 ENCOUNTER — Encounter (INDEPENDENT_AMBULATORY_CARE_PROVIDER_SITE_OTHER): Payer: Self-pay | Admitting: Sports Medicine

## 2016-02-08 VITALS — BP 121/62 | HR 74 | Ht 70.0 in | Wt 194.0 lb

## 2016-02-08 DIAGNOSIS — M5442 Lumbago with sciatica, left side: Secondary | ICD-10-CM

## 2016-02-08 DIAGNOSIS — I6300 Cerebral infarction due to thrombosis of unspecified precerebral artery: Secondary | ICD-10-CM | POA: Diagnosis not present

## 2016-02-08 DIAGNOSIS — M4306 Spondylolysis, lumbar region: Secondary | ICD-10-CM

## 2016-02-08 DIAGNOSIS — G8929 Other chronic pain: Secondary | ICD-10-CM

## 2016-02-08 DIAGNOSIS — Z7901 Long term (current) use of anticoagulants: Secondary | ICD-10-CM

## 2016-02-08 DIAGNOSIS — R269 Unspecified abnormalities of gait and mobility: Secondary | ICD-10-CM

## 2016-02-08 DIAGNOSIS — M25551 Pain in right hip: Secondary | ICD-10-CM | POA: Diagnosis not present

## 2016-02-08 DIAGNOSIS — M5441 Lumbago with sciatica, right side: Secondary | ICD-10-CM | POA: Diagnosis not present

## 2016-02-08 NOTE — Patient Instructions (Signed)
Please stop your nitroglycerin patch. We will see if we can get you feeling better with injections for her low back. Dr. Ernestina Patches schedulers will be calling you. We will need to reach out to your primary care doctor regarding management of your Coumadin but please continue this at this time.   I am transferring practices as of January 1st  to [place] at [facility].  This is a great opportunity & I am saddened to be leaving [facility] however & excited for new opportunities. I will continue to be seeing patients at [facility] through the end of December. I am happy to see you at the new location but also am confident that you are in great hands with the excellent providers here at [facility].  We are not currently scheduling patients at the new location at this time but if you look on [facility] website a contact information should be available there closer to January. Additionally [email or web address] will have information when it becomes available.    The telephone number will be [phone]  - Nobody will be answering this phone number until closer to January.

## 2016-02-08 NOTE — Progress Notes (Signed)
Steven Guerrero - 75 y.o. male MRN [id number]  Date of birth: 1940/11/07  Office Visit Note: Visit Date: 02/08/2016 PCP: Horatio Pel, MD Referred by: Deland Pretty, MD  Subjective: Chief Complaint  Patient presents with  . Right Hip - Follow-up  . Right Leg - Follow-up  . Follow-up    Patient states right hip is not bad and right leg is about the same.  Ambulates with a walker.   HPI: Patient is here for routine 8 week follow-up of right lateral hip pain. Reports continuing to have significant issues with ambulation that is above & beyond his hemiparesis from his prior CVA. He has improved over the lateral aspect of his hip & the pain that was focally located over the greater trochanter has significantly improved. He is continuing to have pain in the buttock & low back area that does radiate down the posterior aspect of his leg towards his feet. He denies any recent falls. He is on anticoagulation therapy. He denies any fevers chills recently gain or weight loss. ROS: Otherwise per HPI.   Clinical History: No specialty comments available.  He reports that he quit smoking about 9 years ago. He does not have any smokeless tobacco history on file.  No results for input(s): HGBA1C, LABURIC in the last 8760 hours.  Assessment & Plan: Visit Diagnoses:  1. Spondylolysis, lumbar region   2. Abnormality of gait   3. Cerebrovascular accident (CVA) due to thrombosis of precerebral artery ([place])   4. Long term current use of anticoagulant   5. Right hip pain   6. Chronic low back pain with bilateral sciatica, unspecified back pain laterality    Plan: Ultimately I do think his lumbar spondylosis is the biggest issue for him at this time. He did previously been seen by neurology where an MRI was obtained & showed significant multilevel degenerative changes. Will refer him for epidural steroid injections with Dr. Ernestina Patches per referral. Ultimately I am okay with him discontinuing the  nitroglycerin patch that he is been using over the greater trochanter but suspect this is significantly improved to tendinopathy that was present & associated with the prior cabling from his periprosthetic hip fracture. We did discuss he will need to be off of anticoagulation but this will need to be discussed with his PCP.    Follow-up: Return in about 8 weeks (around 04/04/2016) for At my new location..  Meds: No orders of the defined types were placed in this encounter.  Procedures: No notes on file   Objective:  VS:  HT:5\' 10"  (177.8 cm)   WT:194 lb (88 kg)  BMI:27.9    BP:121/62  HR:74bpm  TEMP: ( )  RESP:  Physical Exam: elderly male. In no acute distress. Alert and appropriate.  Slight broke his aphasia but cognitively intact. He has a slight right hemiparesis with contractures. He does walk with a cane. No significant pain with internal or external rotation of the right hip. He does have pain on straight leg raise as well as a contracture at the knee. Pain is localized over the right Lucille muscles with direct palpation & into the greater sciatic notch. He does have pain with popliteal compression test. Lower extremity sensation is altered due to his prior CVA. Lower extremity reflexes were deferred. Pedal pulses are palpable.  Imaging: No results found.   Past Medical/Family/Surgical/Social History: Medications & Allergies reviewed per EMR Patient Active Problem List   Diagnosis Date Noted  . Long term current use  of anticoagulant 02/08/2016  . Right hip pain 09/12/2015  . Abnormality of gait 08/28/2015  . Head trauma 08/28/2015  . Stroke ([place]) 08/28/2015  . Neck pain 08/28/2015  . Low back pain 08/28/2015   Past Medical History:  Diagnosis Date  . Anxiety and depression   . Back pain   . Brain bleed ([place])    2003  . CVA (cerebral infarction)    2001  . Hypercholesteremia   . Hypertension   . Peripheral neuropathy ([place])   . Pulmonary embolism ([place])   . Sleep apnea     Family History  Problem Relation Age of Onset  . Heart attack Father     Mother - health hx unknown   Past Surgical History:  Procedure Laterality Date  . HERNIA REPAIR    . LEG SURGERY     Broken femur  . TOTAL HIP ARTHROPLASTY     Right   Social History   Occupational History  . Retired    Social History Main Topics  . Smoking status: Former Smoker    Quit date: 03/24/2006  . Smokeless tobacco: Not on file  . Alcohol use 0.0 oz/week     Comment: 1 beer per day  . Drug use: No  . Sexual activity: Not on file

## 2016-03-06 DIAGNOSIS — M25559 Pain in unspecified hip: Secondary | ICD-10-CM | POA: Diagnosis not present

## 2016-03-06 DIAGNOSIS — Z86718 Personal history of other venous thrombosis and embolism: Secondary | ICD-10-CM | POA: Diagnosis not present

## 2016-03-06 DIAGNOSIS — Z7901 Long term (current) use of anticoagulants: Secondary | ICD-10-CM | POA: Diagnosis not present

## 2016-03-13 DIAGNOSIS — Z0000 Encounter for general adult medical examination without abnormal findings: Secondary | ICD-10-CM | POA: Diagnosis not present

## 2016-03-13 DIAGNOSIS — Z125 Encounter for screening for malignant neoplasm of prostate: Secondary | ICD-10-CM | POA: Diagnosis not present

## 2016-03-13 DIAGNOSIS — I10 Essential (primary) hypertension: Secondary | ICD-10-CM | POA: Diagnosis not present

## 2016-03-19 DIAGNOSIS — Z0000 Encounter for general adult medical examination without abnormal findings: Secondary | ICD-10-CM | POA: Diagnosis not present

## 2016-03-19 DIAGNOSIS — I10 Essential (primary) hypertension: Secondary | ICD-10-CM | POA: Diagnosis not present

## 2016-03-19 DIAGNOSIS — K219 Gastro-esophageal reflux disease without esophagitis: Secondary | ICD-10-CM | POA: Diagnosis not present

## 2016-03-19 DIAGNOSIS — H6123 Impacted cerumen, bilateral: Secondary | ICD-10-CM | POA: Diagnosis not present

## 2016-03-19 DIAGNOSIS — E7800 Pure hypercholesterolemia, unspecified: Secondary | ICD-10-CM | POA: Diagnosis not present

## 2016-04-02 ENCOUNTER — Encounter (INDEPENDENT_AMBULATORY_CARE_PROVIDER_SITE_OTHER): Payer: Self-pay | Admitting: Physical Medicine and Rehabilitation

## 2016-04-02 ENCOUNTER — Encounter (INDEPENDENT_AMBULATORY_CARE_PROVIDER_SITE_OTHER): Payer: Self-pay

## 2016-04-02 ENCOUNTER — Ambulatory Visit (INDEPENDENT_AMBULATORY_CARE_PROVIDER_SITE_OTHER): Payer: Medicare Other | Admitting: Physical Medicine and Rehabilitation

## 2016-04-02 VITALS — BP 139/69 | HR 89

## 2016-04-02 DIAGNOSIS — M5416 Radiculopathy, lumbar region: Secondary | ICD-10-CM

## 2016-04-02 MED ORDER — LIDOCAINE HCL (PF) 1 % IJ SOLN
0.3300 mL | Freq: Once | INTRAMUSCULAR | Status: AC
  Administered 2016-04-02: 0.3 mL

## 2016-04-02 MED ORDER — METHYLPREDNISOLONE ACETATE 80 MG/ML IJ SUSP
80.0000 mg | Freq: Once | INTRAMUSCULAR | Status: AC
  Administered 2016-04-02: 80 mg

## 2016-04-02 NOTE — Progress Notes (Signed)
Steven Guerrero - 76 y.o. male MRN [id number]  Date of birth: 09/02/1940  Office Visit Note: Visit Date: 04/02/2016 PCP: Horatio Pel, MD Referred by: Deland Pretty, MD  Subjective: Chief Complaint  Patient presents with  . Lower Back - Pain   HPI: [name] is a pleasant 76 year old gentleman accompanied by his wife who provides a lot of the history. His case is complicated by prior stroke with residual effects. He is ambulating with a walker. Pain complaining of lower back pain. Points to the middle of his lower back. Says he occosianlly will have pain down legs. Right=Left. Worse with walking and standing and some relief with sitting. Ambulates with walker. He is followed by Dr. Paulla Fore and does have an appointment to see him and his new location. The patient does have MRI evidence of stairstep listhesis of L4 on L5 and L5 on S1. There is fairly severe bilateral foraminal narrowing. This is at L5.  Stopped taking Coumadin on 03/26/16 and will restart this tonight.    ROS Otherwise per HPI.  Assessment & Plan: Visit Diagnoses:  1. Lumbar radiculopathy     Plan: Findings:  Bilateral L5 transforaminal injections diagnostically and hopefully therapeutically.    Meds & Orders:  Meds ordered this encounter  Medications  . lidocaine (PF) (XYLOCAINE) 1 % injection 0.3 mL  . methylPREDNISolone acetate (DEPO-MEDROL) injection 80 mg    Orders Placed This Encounter  Procedures  . Epidural Steroid injection    Follow-up: Return for scheduled follow up with Dr. Paulla Fore. Conisder Facet blocks..   Procedures: No procedures performed  Lumbosacral Transforaminal Epidural Steroid Injection - Infraneural Approach with Fluoroscopic Guidance  Patient: Steven Guerrero      Date of Birth: 05/07/1940 MRN: [id number] PCP: Horatio Pel, MD      Visit Date: 04/02/2016   Universal Protocol:    Date/Time: 01/10/183:39 PM  Consent Given By: the patient  Position: PRONE   Additional  Comments: Vital signs were monitored before and after the procedure. Patient was prepped and draped in the usual sterile fashion. The correct patient, procedure, and site was verified.   Injection Procedure Details:  Procedure Site One Meds Administered:  Meds ordered this encounter  Medications  . lidocaine (PF) (XYLOCAINE) 1 % injection 0.3 mL  . methylPREDNISolone acetate (DEPO-MEDROL) injection 80 mg      Laterality: Bilateral  Location/Site:  L5-S1  Needle size: 22 G  Needle type: Spinal  Needle Placement: Transforaminal  Findings:  -Contrast Used: 2 mL iohexol 180 mg iodine/mL   -Comments: Excellent flow of contrast along the nerve and into the epidural space.  Procedure Details: After squaring off the end-plates of the desired vertebral level to get a true AP view, the C-arm was obliqued to the painful side so that the superior articulating process is positioned about 1/3 the length of the inferior endplate.  The needle was aimed toward the junction of the superior articular process and the transverse process of the inferior vertebrae. The needle's initial entry is in the lower third of the foramen through Kambin's triangle. The soft tissues overlying this target were infiltrated with 2-3 ml. of 1% Lidocaine without Epinephrine.  The spinal needle was then inserted and advanced toward the target using a "trajectory" view along the fluoroscope beam.  Under AP and lateral visualization, the needle was advanced so it did not puncture dura and did not traverse medially beyond the 6 o'clock position of the pedicle. Bi-planar projections were used to confirm  position. Aspiration was confirmed to be negative for CSF and/or blood. A 1-2 ml. volume of Isovue-250 was injected and flow of contrast was noted at each level. Radiographs were obtained for documentation purposes.   After attaining the desired flow of contrast documented above, a 0.5 to 1.0 ml test dose of 0.25% Marcaine was  injected into each respective transforaminal space.  The patient was observed for 90 seconds post injection.  After no sensory deficits were reported, and normal lower extremity motor function was noted,   the above injectate was administered so that equal amounts of the injectate were placed at each foramen (level) into the transforaminal epidural space.   Additional Comments:  No complications occurred. Patient had a fairly easy time with the injection itself but with the injectate delivered to the foramen the patient had quite a bit of almost exaggerated pain. He did well afterwards and there were no residual effects. It was a tight space and he did well otherwise. Dressing: Band-Aid and 2x2 sterile gauze     Post-procedure details: Patient was observed during the procedure. Post-procedure instructions were reviewed.  Patient left the clinic in stable condition.     Clinical History: 09/10/2015 Lspine MRI IMPRESSION:  This MRI of the lumbar spine without contrast shows the following: 1.   There is 1-2 mm of anterolisthesis at L4-L5 associated with moderately severe bilateral facet hypertrophy and disc bulging. There does not appear to be any nerve root impingement at this level. 2.   There is 5-6 mm of retrolisthesis of L5 upon S1 associated with mild to moderate facet hypertrophy and mild disc bulging. There is moderately severe bilateral foraminal narrowing, a little more on the left, that could lead to compression of either of the exiting L5 nerve roots. There is also some encroachment upon the right S1 nerve root at the right lateral recess.   He reports that he quit smoking about 10 years ago. He does not have any smokeless tobacco history on file. No results for input(s): HGBA1C, LABURIC in the last 8760 hours.  Objective:  VS:  HT:    WT:   BMI:     BP:139/69  HR:89bpm  TEMP: ( )  RESP:95 % Physical Exam  Musculoskeletal:  Patient has residual left-sided hemiparesis from  stroke. He does ambulate with a walker. He has good distal strength on the right.    Ortho Exam Imaging: No results found.  Past Medical/Family/Surgical/Social History: Medications & Allergies reviewed per EMR Patient Active Problem List   Diagnosis Date Noted  . Long term current use of anticoagulant 02/08/2016  . Right hip pain 09/12/2015  . Abnormality of gait 08/28/2015  . Head trauma 08/28/2015  . Stroke ([place]) 08/28/2015  . Neck pain 08/28/2015  . Low back pain 08/28/2015   Past Medical History:  Diagnosis Date  . Anxiety and depression   . Back pain   . Brain bleed ([place])    2003  . CVA (cerebral infarction)    2001  . Hypercholesteremia   . Hypertension   . Peripheral neuropathy ([place])   . Pulmonary embolism ([place])   . Sleep apnea    Family History  Problem Relation Age of Onset  . Heart attack Father     Mother - health hx unknown   Past Surgical History:  Procedure Laterality Date  . HERNIA REPAIR    . LEG SURGERY     Broken femur  . TOTAL HIP ARTHROPLASTY     Right  Social History   Occupational History  . Retired    Social History Main Topics  . Smoking status: Former Smoker    Quit date: 03/24/2006  . Smokeless tobacco: Not on file  . Alcohol use 0.0 oz/week     Comment: 1 beer per day  . Drug use: No  . Sexual activity: Not on file

## 2016-04-02 NOTE — Patient Instructions (Signed)

## 2016-04-02 NOTE — Procedures (Signed)
Lumbosacral Transforaminal Epidural Steroid Injection - Infraneural Approach with Fluoroscopic Guidance  Patient: Steven Guerrero      Date of Birth: March 06, 1941 MRN: [id number] PCP: Horatio Pel, MD      Visit Date: 04/02/2016   Universal Protocol:    Date/Time: 01/10/183:39 PM  Consent Given By: the patient  Position: PRONE   Additional Comments: Vital signs were monitored before and after the procedure. Patient was prepped and draped in the usual sterile fashion. The correct patient, procedure, and site was verified.   Injection Procedure Details:  Procedure Site One Meds Administered:  Meds ordered this encounter  Medications  . lidocaine (PF) (XYLOCAINE) 1 % injection 0.3 mL  . methylPREDNISolone acetate (DEPO-MEDROL) injection 80 mg      Laterality: Bilateral  Location/Site:  L5-S1  Needle size: 22 G  Needle type: Spinal  Needle Placement: Transforaminal  Findings:  -Contrast Used: 2 mL iohexol 180 mg iodine/mL   -Comments: Excellent flow of contrast along the nerve and into the epidural space.  Procedure Details: After squaring off the end-plates of the desired vertebral level to get a true AP view, the C-arm was obliqued to the painful side so that the superior articulating process is positioned about 1/3 the length of the inferior endplate.  The needle was aimed toward the junction of the superior articular process and the transverse process of the inferior vertebrae. The needle's initial entry is in the lower third of the foramen through Kambin's triangle. The soft tissues overlying this target were infiltrated with 2-3 ml. of 1% Lidocaine without Epinephrine.  The spinal needle was then inserted and advanced toward the target using a "trajectory" view along the fluoroscope beam.  Under AP and lateral visualization, the needle was advanced so it did not puncture dura and did not traverse medially beyond the 6 o'clock position of the pedicle. Bi-planar  projections were used to confirm position. Aspiration was confirmed to be negative for CSF and/or blood. A 1-2 ml. volume of Isovue-250 was injected and flow of contrast was noted at each level. Radiographs were obtained for documentation purposes.   After attaining the desired flow of contrast documented above, a 0.5 to 1.0 ml test dose of 0.25% Marcaine was injected into each respective transforaminal space.  The patient was observed for 90 seconds post injection.  After no sensory deficits were reported, and normal lower extremity motor function was noted,   the above injectate was administered so that equal amounts of the injectate were placed at each foramen (level) into the transforaminal epidural space.   Additional Comments:  No complications occurred. Patient had a fairly easy time with the injection itself but with the injectate delivered to the foramen the patient had quite a bit of almost exaggerated pain. He did well afterwards and there were no residual effects. It was a tight space and he did well otherwise. Dressing: Band-Aid and 2x2 sterile gauze     Post-procedure details: Patient was observed during the procedure. Post-procedure instructions were reviewed.  Patient left the clinic in stable condition.

## 2016-04-04 ENCOUNTER — Telehealth: Payer: Self-pay | Admitting: *Deleted

## 2016-04-04 NOTE — Telephone Encounter (Signed)
Patient's wife calling to schedule a follow-up for him to see Dr. Paulla Fore.  Would like a call when the office opens to schedule this.

## 2016-04-07 DIAGNOSIS — Z86718 Personal history of other venous thrombosis and embolism: Secondary | ICD-10-CM | POA: Diagnosis not present

## 2016-04-07 DIAGNOSIS — Z7901 Long term (current) use of anticoagulants: Secondary | ICD-10-CM | POA: Diagnosis not present

## 2016-04-22 DIAGNOSIS — Z86718 Personal history of other venous thrombosis and embolism: Secondary | ICD-10-CM | POA: Diagnosis not present

## 2016-04-22 DIAGNOSIS — Z7901 Long term (current) use of anticoagulants: Secondary | ICD-10-CM | POA: Diagnosis not present

## 2016-04-25 ENCOUNTER — Ambulatory Visit (INDEPENDENT_AMBULATORY_CARE_PROVIDER_SITE_OTHER): Payer: Medicare Other | Admitting: Sports Medicine

## 2016-04-25 ENCOUNTER — Encounter: Payer: Self-pay | Admitting: Sports Medicine

## 2016-04-25 ENCOUNTER — Other Ambulatory Visit: Payer: Self-pay

## 2016-04-25 VITALS — BP 102/62 | HR 68 | Ht 71.5 in | Wt 187.8 lb

## 2016-04-25 DIAGNOSIS — M5441 Lumbago with sciatica, right side: Secondary | ICD-10-CM | POA: Diagnosis not present

## 2016-04-25 DIAGNOSIS — M47816 Spondylosis without myelopathy or radiculopathy, lumbar region: Secondary | ICD-10-CM

## 2016-04-25 DIAGNOSIS — G8929 Other chronic pain: Secondary | ICD-10-CM

## 2016-04-25 DIAGNOSIS — M25551 Pain in right hip: Secondary | ICD-10-CM | POA: Diagnosis not present

## 2016-04-25 DIAGNOSIS — M5442 Lumbago with sciatica, left side: Secondary | ICD-10-CM

## 2016-04-25 DIAGNOSIS — R269 Unspecified abnormalities of gait and mobility: Secondary | ICD-10-CM | POA: Diagnosis not present

## 2016-04-25 MED ORDER — BACLOFEN 10 MG PO TABS: 10.0000 mg | ORAL_TABLET | Freq: Every day | ORAL | 5 refills | Status: DC

## 2016-04-25 MED ORDER — TRAMADOL HCL 50 MG PO TABS: 50.0000 mg | ORAL_TABLET | Freq: Four times a day (QID) | ORAL | 5 refills | Status: DC | PRN

## 2016-04-25 NOTE — Assessment & Plan Note (Signed)
Patient reports only minimal improvement in his discomfort and ambulation following epidural steroid injections.  He denies any pain at this time today but his wife does report he constantly complains of his back and legs.  Recommend him continue to remain as active as possible and recommend daily physical activity including ambulation and sit to stand next exercises.  Discussed the options for further referral to physical therapy which they decline at this time due to exorbitant out of pocket expenses.  I did recommend continued use of a walker for safety and to ensure no falls. This was discussed in detail today.  Patient is not interested in facet injections at this time but I did discuss that this is an option for him going forward.  I'm happy to refill his tramadol and baclofen as needed.

## 2016-04-25 NOTE — Progress Notes (Signed)
Steven Guerrero - 77 y.o. male MRN [id number]  Date of birth: 1940/07/14  Office Visit Note: Visit Date: 04/25/2016 PCP: Horatio Pel, MD Referred by: Deland Pretty, MD  Subjective: Chief Complaint  Patient presents with  . Follow-up    back pain x 6-8 mos.   HPI: Patient here for follow-up of chronic low back pain and difficulty walking.  He has recently undergone an epidural steroid injection L5-S1 transforaminal with Dr. Ernestina Patches.  Reported some discomfort/pain during the procedure but no residual issues.  He does not think that this provided any significant/meaningful relief.  He has continued to use a walker while ambulating outside of the home.  He is status post stroke with residual deficit does not report any significant change in this time.  Right hip pain is improved as well and he denies any pain at this time He continues to take tramadol and baclofen at night and this has allowed him to sleep continuously throughout the night without nighttime awakenings.  He continues to sleep in a recliner due to this being the most comfortable position for him.  ROS: Denies fevers, chills, recent weight gain or weight loss.  No night sweats. No significant nighttime awakenings due to this issue.  Patient denies any new or different facial asymmetry, unilateral weakness, or dysarthria.   Otherwise per HPI.  Objective:  VS:  HT:5' 11.5" (181.6 cm)   WT:187 lb 12.8 oz (85.2 kg)  BMI:25.9    BP:102/62  HR:68bpm  TEMP: ( )  RESP:95 % Physical Exam: Adult male. Alert and appropriate.  In no acute distress.  Lower extremities are overall well aligned with no significant deformity. No significant swelling.  Distal pulses 2+/4. No significant bruising/ecchymosis or erythema the skin Back: Patient overall ambulates with no assistance unsteadily.  His sit to stand test is less than 3 seconds.  He is unable to heel or toe walk due more so to balance issues.  He has a negative straight leg raise  bilaterally.   Imaging: No results found.  Assessment & Plan: Visit Diagnoses:    ICD-9-CM ICD-10-CM   1. Abnormality of gait 781.2 R26.9   2. Chronic low back pain with bilateral sciatica, unspecified back pain laterality 724.2 M54.41 baclofen (LIORESAL) 10 MG tablet   338.29 G89.29 traMADol (ULTRAM) 50 MG tablet    M54.42   3. Right hip pain 719.45 M25.551   4. Lumbar spondylosis 721.3 M47.816     Low back pain Patient reports only minimal improvement in his discomfort and ambulation following epidural steroid injections.  He denies any pain at this time today but his wife does report he constantly complains of his back and legs.  Recommend him continue to remain as active as possible and recommend daily physical activity including ambulation and sit to stand next exercises.  Discussed the options for further referral to physical therapy which they decline at this time due to exorbitant out of pocket expenses.  I did recommend continued use of a walker for safety and to ensure no falls. This was discussed in detail today.  Patient is not interested in facet injections at this time but I did discuss that this is an option for him going forward.  I'm happy to refill his tramadol and baclofen as needed.   Procedures: N/a  Follow-up: Return in about 6 months (around 10/23/2016) for repeat clinical exam.    Past Medical/Family/Surgical/Social History: Medications & Allergies reviewed per EMR Patient Active Problem List   Diagnosis  Date Noted  . Long term current use of anticoagulant 02/08/2016  . Right hip pain 09/12/2015  . Abnormality of gait 08/28/2015  . Head trauma 08/28/2015  . Stroke ([place]) 08/28/2015  . Neck pain 08/28/2015  . Low back pain 08/28/2015   Past Medical History:  Diagnosis Date  . Anxiety and depression   . Back pain   . Brain bleed ([place])    2003  . CVA (cerebral infarction)    2001  . Hypercholesteremia   . Hypertension   . Peripheral neuropathy  ([place])   . Pulmonary embolism ([place])   . Sleep apnea    Family History  Problem Relation Age of Onset  . Heart attack Father     Mother - health hx unknown   Past Surgical History:  Procedure Laterality Date  . HERNIA REPAIR    . LEG SURGERY     Broken femur  . TOTAL HIP ARTHROPLASTY     Right   Social History   Occupational History  . Retired    Social History Main Topics  . Smoking status: Former Smoker    Quit date: 03/24/2006  . Smokeless tobacco: Never Used  . Alcohol use 0.0 oz/week     Comment: 1 beer per day  . Drug use: No  . Sexual activity: Not on file

## 2016-05-02 DIAGNOSIS — H353131 Nonexudative age-related macular degeneration, bilateral, early dry stage: Secondary | ICD-10-CM | POA: Diagnosis not present

## 2016-05-09 ENCOUNTER — Emergency Department (HOSPITAL_COMMUNITY): Payer: Medicare Other

## 2016-05-09 ENCOUNTER — Encounter (HOSPITAL_COMMUNITY): Payer: Self-pay | Admitting: Emergency Medicine

## 2016-05-09 ENCOUNTER — Emergency Department (HOSPITAL_COMMUNITY)
Admission: EM | Admit: 2016-05-09 | Discharge: 2016-05-09 | Disposition: A | Discharge status: Home / Self Care | Payer: Medicare Other | Attending: Emergency Medicine | Admitting: Emergency Medicine

## 2016-05-09 DIAGNOSIS — Z8673 Personal history of transient ischemic attack (TIA), and cerebral infarction without residual deficits: Secondary | ICD-10-CM | POA: Insufficient documentation

## 2016-05-09 DIAGNOSIS — R591 Generalized enlarged lymph nodes: Secondary | ICD-10-CM

## 2016-05-09 DIAGNOSIS — Z7901 Long term (current) use of anticoagulants: Secondary | ICD-10-CM | POA: Insufficient documentation

## 2016-05-09 DIAGNOSIS — I10 Essential (primary) hypertension: Secondary | ICD-10-CM | POA: Diagnosis not present

## 2016-05-09 DIAGNOSIS — Z87891 Personal history of nicotine dependence: Secondary | ICD-10-CM | POA: Insufficient documentation

## 2016-05-09 DIAGNOSIS — Z96641 Presence of right artificial hip joint: Secondary | ICD-10-CM | POA: Insufficient documentation

## 2016-05-09 DIAGNOSIS — R220 Localized swelling, mass and lump, head: Secondary | ICD-10-CM | POA: Diagnosis present

## 2016-05-09 DIAGNOSIS — Z79899 Other long term (current) drug therapy: Secondary | ICD-10-CM | POA: Diagnosis not present

## 2016-05-09 DIAGNOSIS — R221 Localized swelling, mass and lump, neck: Secondary | ICD-10-CM | POA: Diagnosis not present

## 2016-05-09 LAB — CBC WITH DIFFERENTIAL/PLATELET
Basophils Absolute: 0 10*3/uL (ref 0.0–0.1)
Basophils Relative: 0 %
Eosinophils Absolute: 0.1 10*3/uL (ref 0.0–0.7)
Eosinophils Relative: 2 %
HCT: 40 % (ref 39.0–52.0)
Hemoglobin: 13.2 g/dL (ref 13.0–17.0)
Lymphocytes Relative: 25 %
Lymphs Abs: 1.6 10*3/uL (ref 0.7–4.0)
MCH: 30.1 pg (ref 26.0–34.0)
MCHC: 33 g/dL (ref 30.0–36.0)
MCV: 91.1 fL (ref 78.0–100.0)
Monocytes Absolute: 0.6 10*3/uL (ref 0.1–1.0)
Monocytes Relative: 10 %
Neutro Abs: 3.9 10*3/uL (ref 1.7–7.7)
Neutrophils Relative %: 63 %
Platelets: 264 10*3/uL (ref 150–400)
RBC: 4.39 MIL/uL (ref 4.22–5.81)
RDW: 14.5 % (ref 11.5–15.5)
WBC: 6.3 10*3/uL (ref 4.0–10.5)

## 2016-05-09 LAB — BASIC METABOLIC PANEL
Anion gap: 11 (ref 5–15)
BUN: 20 mg/dL (ref 6–20)
CO2: 27 mmol/L (ref 22–32)
Calcium: 9.4 mg/dL (ref 8.9–10.3)
Chloride: 100 mmol/L — ABNORMAL LOW (ref 101–111)
Creatinine, Ser: 1.48 mg/dL — ABNORMAL HIGH (ref 0.61–1.24)
GFR calc Af Amer: 51 mL/min — ABNORMAL LOW (ref >60)
GFR calc non Af Amer: 44 mL/min — ABNORMAL LOW (ref >60)
Glucose, Bld: 95 mg/dL (ref 65–99)
Potassium: 4.3 mmol/L (ref 3.5–5.1)
Sodium: 138 mmol/L (ref 135–145)

## 2016-05-09 LAB — RAPID STREP SCREEN (MED CTR MEBANE ONLY): Streptococcus, Group A Screen (Direct): NEGATIVE

## 2016-05-09 MED ORDER — IOPAMIDOL (ISOVUE-300) INJECTION 61%
INTRAVENOUS | Status: AC
  Administered 2016-05-09: 50 mL
  Filled 2016-05-09: qty 50

## 2016-05-09 NOTE — Discharge Instructions (Signed)
Please read attached information. If you experience any new or worsening signs or symptoms please return to the emergency room for evaluation. Please follow-up with your primary care provider or specialist as discussed.  °

## 2016-05-09 NOTE — ED Notes (Signed)
Pt verbalized understanding of discharge instructions and denies any further questions at this time.   

## 2016-05-09 NOTE — ED Notes (Signed)
ED Provider at bedside. 

## 2016-05-09 NOTE — ED Provider Notes (Signed)
[facility] DEPT Provider Note   CSN: [id number] Arrival date & time: 05/09/16  0917   History   Chief Complaint Chief Complaint  Patient presents with  . Facial Swelling    HPI Steven Guerrero is a 76 y.o. male.  HPI   76 year old male presents today with right-sided neck swelling.  Patient has a history of CVA, wife is at bedside to assist in history.  She notes yesterday patient had severe right-sided neck swelling below his jaw.  She notes this has dramatically improved, patient still has tenderness to palpation of the area.  Patient notes that he does not have any significant pain, difficulty swallowing, drooling or changes in his speech, but notes an abnormal sensation in the right lateral neck.  He notes several weeks ago he had an upper respiratory infection that has now since resolved, denies any recent fevers or any other infectious etiology.  Patient has no history of the same.   Past Medical History:  Diagnosis Date  . Anxiety and depression   . Back pain   . Brain bleed ([place])    2003  . CVA (cerebral infarction)    2001  . Hypercholesteremia   . Hypertension   . Peripheral neuropathy ([place])   . Pulmonary embolism ([place])   . Sleep apnea     Patient Active Problem List   Diagnosis Date Noted  . Long term current use of anticoagulant 02/08/2016  . Right hip pain 09/12/2015  . Abnormality of gait 08/28/2015  . Head trauma 08/28/2015  . Stroke ([place]) 08/28/2015  . Neck pain 08/28/2015  . Low back pain 08/28/2015    Past Surgical History:  Procedure Laterality Date  . HERNIA REPAIR    . LEG SURGERY     Broken femur  . TOTAL HIP ARTHROPLASTY     Right       Home Medications    Prior to Admission medications   Medication Sig Start Date End Date Taking? Authorizing Provider  amLODipine (NORVASC) 10 MG tablet Take 10 mg by mouth at bedtime.    Historical Provider, MD  atorvastatin (LIPITOR) 40 MG tablet Take 40 mg by mouth daily.    Historical Provider,  MD  baclofen (LIORESAL) 10 MG tablet Take 1 tablet (10 mg total) by mouth at bedtime. 04/25/16   Gerda Diss, DO  citalopram (CELEXA) 40 MG tablet Take 40 mg by mouth at bedtime.    Historical Provider, MD  fish oil-omega-3 fatty acids 1000 MG capsule Take 1 g by mouth daily.    Historical Provider, MD  gabapentin (NEURONTIN) 100 MG capsule TAKE ONE CAPSULE BY MOUTH 2 TIMES A DAY AS DIRECTED 08/09/15   Historical Provider, MD  HYDROcodone-acetaminophen (NORCO/VICODIN) 5-325 MG tablet TAKE 1 TABLET TWICE A DAY AS NEEDED SEVERE PAIN 08/23/15   Historical Provider, MD  losartan-hydrochlorothiazide (HYZAAR) 100-25 MG per tablet Take 1 tablet by mouth daily.    Historical Provider, MD  Multiple Vitamin (MULTIVITAMIN WITH MINERALS) TABS tablet Take 1 tablet by mouth daily.    Historical Provider, MD  omeprazole (PRILOSEC) 20 MG capsule Take 20 mg by mouth daily.    Historical Provider, MD  pantoprazole (PROTONIX) 40 MG tablet  02/15/16   Historical Provider, MD  traMADol (ULTRAM) 50 MG tablet Take 1 tablet (50 mg total) by mouth every 6 (six) hours as needed. 04/25/16   Gerda Diss, DO  traZODone (DESYREL) 50 MG tablet Take 50 mg by mouth at bedtime.    Historical  Provider, MD  vitamin B-12 (CYANOCOBALAMIN) 1000 MCG tablet Take 1,000 mcg by mouth daily.    Historical Provider, MD  warfarin (COUMADIN) 5 MG tablet Take 5-7.5 mg by mouth See admin instructions. Take 1.5 tablets everyday but Tuesday take 1 tablet.    Historical Provider, MD    Family History Family History  Problem Relation Age of Onset  . Heart attack Father     Mother - health hx unknown    Social History Social History  Substance Use Topics  . Smoking status: Former Smoker    Quit date: 03/24/2006  . Smokeless tobacco: Never Used  . Alcohol use 0.0 oz/week     Comment: 1 beer per day     Allergies   Atenolol; Lotensin [benazepril hcl]; and Wellbutrin [bupropion]   Review of Systems Review of Systems  All other systems  reviewed and are negative.    Physical Exam Updated Vital Signs BP 113/75   Pulse (!) 54   Temp 97.6 F (36.4 C) (Oral)   Resp 18   Ht 5\' 10"  (1.778 m)   Wt 84.8 kg   SpO2 93%   BMI 26.83 kg/m   Physical Exam  Constitutional: He is oriented to person, place, and time. He appears well-developed and well-nourished.  HENT:  Head: Normocephalic and atraumatic.  Oropharynx is clear of secretions, uvula is midline and rises with phonation.,  Minor right-sided tonsillar swelling , no exudate, no other abnormalities noted in the oropharynx  Eyes: Conjunctivae are normal. Pupils are equal, round, and reactive to light. Right eye exhibits no discharge. Left eye exhibits no discharge. No scleral icterus.  Neck: Normal range of motion. No JVD present. No tracheal deviation present.  Tender right anterior cervical lymphadenopathy.  Trachea is midline  Pulmonary/Chest: Effort normal. No stridor.  Neurological: He is alert and oriented to person, place, and time. Coordination normal.  Psychiatric: He has a normal mood and affect. His behavior is normal. Judgment and thought content normal.  Nursing note and vitals reviewed.    ED Treatments / Results  Labs (all labs ordered are listed, but only abnormal results are displayed) Labs Reviewed  BASIC METABOLIC PANEL - Abnormal; Notable for the following:       Result Value   Chloride 100 (*)    Creatinine, Ser 1.48 (*)    GFR calc non Af Amer 44 (*)    GFR calc Af Amer 51 (*)    All other components within normal limits  RAPID STREP SCREEN (NOT AT [facility])  CULTURE, GROUP A STREP ([place])  CBC WITH DIFFERENTIAL/PLATELET    EKG  EKG Interpretation None       Radiology Ct Soft Tissue Neck W Contrast  Result Date: 05/09/2016 CLINICAL DATA:  76 year old male with right lateral face and neck swelling. Initial encounter. EXAM: CT NECK WITH CONTRAST TECHNIQUE: Multidetector CT imaging of the neck was performed using the standard protocol  following the bolus administration of intravenous contrast. CONTRAST:  84mL ISOVUE-300 IOPAMIDOL (ISOVUE-300) INJECTION 61% COMPARISON:  Cervical spine MRI 09/04/2015. CT head and cervical spine 10/26/2014. Face CT 07/14/2013. FINDINGS: Pharynx and larynx: Larynx is within normal limits. Pharyngeal soft tissue contours appear stable and within normal limits. Negative parapharyngeal and retropharyngeal spaces. Salivary glands: Sublingual space and submandibular glands are within normal limits. Parotid glands appear normal. Thyroid: Negative. Lymph nodes: No cervical lymphadenopathy. No focal soft tissue inflammation identified except for suggestion of bilateral skin thickening at the level of the hyoid bone (series 201,  image 56). Vascular: Major vascular structures in the neck and at the skullbase are patent, including the right internal jugular vein. Extensive Calcified aortic atherosclerosis. Extensive proximal great vessel calcified atherosclerosis. Soft and calcified plaque affecting both carotid bifurcations. Extensive Calcified atherosclerosis at the skull base. Limited intracranial: Negative. Visualized orbits: Negative. Mastoids and visualized paranasal sinuses: Visualized paranasal sinuses and mastoids are stable and well pneumatized. The tympanic cavities are clear. Skeleton: Largely absent dentition. Degenerative osseous changes throughout the cervical spine. No acute osseous abnormality identified. Upper chest: Partially visible calcified pleural plaques with upper lobe centrilobular emphysema. Superimposed dependent ground-glass opacity due to atelectasis. Retained secretions in the trachea (series 207, image 35). No superior mediastinal or axillary lymphadenopathy. IMPRESSION: 1. Negative neck CT. No inflammatory changes identified except for possible symmetric bilateral skin thickening at the level of the hyoid bone. 2. Retained secretions in the trachea. Emphysema and calcified pleural plaques. 3.  Extensive Calcified aortic and carotid atherosclerosis. Electronically Signed   By: Genevie Ann M.D.   On: 05/09/2016 14:16    Procedures Procedures (including critical care time)  Medications Ordered in ED Medications  iopamidol (ISOVUE-300) 61 % injection (50 mLs  Contrast Given 05/09/16 1353)     Initial Impression / Assessment and Plan / ED Course  I have reviewed the triage vital signs and the nursing notes.  Pertinent labs & imaging results that were available during my care of the patient were reviewed by me and considered in my medical decision making (see chart for details).      Final Clinical Impressions(s) / ED Diagnoses   Final diagnoses:  Lymphadenopathy   Labs: Rapid strep, CBC, BMP  Imaging: CT neck soft tissue with contrast  Consults:  Therapeutics:  Discharge Meds:   Assessment/Plan:   76 year old male presents today with lymphadenopathy.  Patient has minor swelling to the right tonsil, minor right-sided lymphadenopathy.  Patient does not appear to have any signs of significant infection, very well-appearing reports symptoms continue to improve.  CT negative.  Patient will be referred to primary care for reassessment, encouraged to return immediately if he has any new or worsening signs or symptoms.  Both the patient and his wife verbalized her understanding and agreement to today's plan had no further questions or concerns at time of discharge.       New Prescriptions Discharge Medication List as of 05/09/2016  2:52 PM       Okey Regal, PA-C 05/09/16 [place], MD 05/16/16 [phone]

## 2016-05-09 NOTE — ED Triage Notes (Signed)
Pt sts swelling in right side of neck and face; no distress noted; pt sts hx of stroke in past

## 2016-05-09 NOTE — ED Notes (Signed)
Patient transported to CT 

## 2016-05-11 LAB — CULTURE, GROUP A STREP (THRC)

## 2016-05-13 DIAGNOSIS — K1120 Sialoadenitis, unspecified: Secondary | ICD-10-CM | POA: Diagnosis not present

## 2016-05-22 DIAGNOSIS — Z7901 Long term (current) use of anticoagulants: Secondary | ICD-10-CM | POA: Diagnosis not present

## 2016-05-22 DIAGNOSIS — Z86718 Personal history of other venous thrombosis and embolism: Secondary | ICD-10-CM | POA: Diagnosis not present

## 2016-07-01 DIAGNOSIS — Z86718 Personal history of other venous thrombosis and embolism: Secondary | ICD-10-CM | POA: Diagnosis not present

## 2016-07-01 DIAGNOSIS — Z7901 Long term (current) use of anticoagulants: Secondary | ICD-10-CM | POA: Diagnosis not present

## 2016-07-29 DIAGNOSIS — Z86718 Personal history of other venous thrombosis and embolism: Secondary | ICD-10-CM | POA: Diagnosis not present

## 2016-07-29 DIAGNOSIS — Z7901 Long term (current) use of anticoagulants: Secondary | ICD-10-CM | POA: Diagnosis not present

## 2016-08-07 ENCOUNTER — Emergency Department (HOSPITAL_COMMUNITY): Payer: Medicare Other

## 2016-08-07 ENCOUNTER — Emergency Department (HOSPITAL_COMMUNITY)
Admission: EM | Admit: 2016-08-07 | Discharge: 2016-08-07 | Disposition: A | Discharge status: Home / Self Care | Payer: Medicare Other | Attending: Emergency Medicine | Admitting: Emergency Medicine

## 2016-08-07 DIAGNOSIS — Z87891 Personal history of nicotine dependence: Secondary | ICD-10-CM | POA: Diagnosis not present

## 2016-08-07 DIAGNOSIS — S0990XA Unspecified injury of head, initial encounter: Secondary | ICD-10-CM | POA: Diagnosis not present

## 2016-08-07 DIAGNOSIS — I10 Essential (primary) hypertension: Secondary | ICD-10-CM | POA: Diagnosis not present

## 2016-08-07 DIAGNOSIS — S01511A Laceration without foreign body of lip, initial encounter: Secondary | ICD-10-CM | POA: Insufficient documentation

## 2016-08-07 DIAGNOSIS — R930 Abnormal findings on diagnostic imaging of skull and head, not elsewhere classified: Secondary | ICD-10-CM | POA: Diagnosis not present

## 2016-08-07 DIAGNOSIS — Y92009 Unspecified place in unspecified non-institutional (private) residence as the place of occurrence of the external cause: Secondary | ICD-10-CM | POA: Insufficient documentation

## 2016-08-07 DIAGNOSIS — N289 Disorder of kidney and ureter, unspecified: Secondary | ICD-10-CM

## 2016-08-07 DIAGNOSIS — Z8673 Personal history of transient ischemic attack (TIA), and cerebral infarction without residual deficits: Secondary | ICD-10-CM | POA: Insufficient documentation

## 2016-08-07 DIAGNOSIS — S0191XA Laceration without foreign body of unspecified part of head, initial encounter: Secondary | ICD-10-CM | POA: Diagnosis not present

## 2016-08-07 DIAGNOSIS — Y999 Unspecified external cause status: Secondary | ICD-10-CM | POA: Insufficient documentation

## 2016-08-07 DIAGNOSIS — S60221A Contusion of right hand, initial encounter: Secondary | ICD-10-CM | POA: Diagnosis not present

## 2016-08-07 DIAGNOSIS — Z7901 Long term (current) use of anticoagulants: Secondary | ICD-10-CM | POA: Insufficient documentation

## 2016-08-07 DIAGNOSIS — Y939 Activity, unspecified: Secondary | ICD-10-CM | POA: Diagnosis not present

## 2016-08-07 DIAGNOSIS — Z79899 Other long term (current) drug therapy: Secondary | ICD-10-CM | POA: Insufficient documentation

## 2016-08-07 DIAGNOSIS — W01198A Fall on same level from slipping, tripping and stumbling with subsequent striking against other object, initial encounter: Secondary | ICD-10-CM | POA: Diagnosis not present

## 2016-08-07 DIAGNOSIS — Z96641 Presence of right artificial hip joint: Secondary | ICD-10-CM | POA: Insufficient documentation

## 2016-08-07 DIAGNOSIS — S0450XA Injury of facial nerve, unspecified side, initial encounter: Secondary | ICD-10-CM | POA: Diagnosis not present

## 2016-08-07 DIAGNOSIS — S199XXA Unspecified injury of neck, initial encounter: Secondary | ICD-10-CM | POA: Diagnosis not present

## 2016-08-07 LAB — CBC WITH DIFFERENTIAL/PLATELET
BASOS PCT: 0 %
Basophils Absolute: 0 10*3/uL (ref 0.0–0.1)
EOS PCT: 2 %
Eosinophils Absolute: 0.1 10*3/uL (ref 0.0–0.7)
HCT: 38.2 % — ABNORMAL LOW (ref 39.0–52.0)
Hemoglobin: 13 g/dL (ref 13.0–17.0)
Lymphocytes Relative: 25 %
Lymphs Abs: 1.6 10*3/uL (ref 0.7–4.0)
MCH: 31.2 pg (ref 26.0–34.0)
MCHC: 34 g/dL (ref 30.0–36.0)
MCV: 91.6 fL (ref 78.0–100.0)
MONO ABS: 0.8 10*3/uL (ref 0.1–1.0)
Monocytes Relative: 12 %
Neutro Abs: 3.8 10*3/uL (ref 1.7–7.7)
Neutrophils Relative %: 61 %
PLATELETS: 216 10*3/uL (ref 150–400)
RBC: 4.17 MIL/uL — AB (ref 4.22–5.81)
RDW: 14.3 % (ref 11.5–15.5)
WBC: 6.3 10*3/uL (ref 4.0–10.5)

## 2016-08-07 LAB — BASIC METABOLIC PANEL
ANION GAP: 8 (ref 5–15)
BUN: 33 mg/dL — ABNORMAL HIGH (ref 6–20)
CALCIUM: 9.3 mg/dL (ref 8.9–10.3)
CO2: 25 mmol/L (ref 22–32)
Chloride: 105 mmol/L (ref 101–111)
Creatinine, Ser: 1.81 mg/dL — ABNORMAL HIGH (ref 0.61–1.24)
GFR, EST AFRICAN AMERICAN: 40 mL/min — AB (ref >60)
GFR, EST NON AFRICAN AMERICAN: 35 mL/min — AB (ref >60)
Glucose, Bld: 93 mg/dL (ref 65–99)
POTASSIUM: 5.2 mmol/L — AB (ref 3.5–5.1)
SODIUM: 138 mmol/L (ref 135–145)

## 2016-08-07 LAB — PROTIME-INR
INR: 1.93
PROTHROMBIN TIME: 22.3 s — AB (ref 11.4–15.2)

## 2016-08-07 MED ORDER — TRAMADOL HCL 50 MG PO TABS
50.0000 mg | ORAL_TABLET | Freq: Once | ORAL | Status: AC
Start: 2016-08-07 — End: 2016-08-07
  Administered 2016-08-07: 50 mg via ORAL
  Filled 2016-08-07: qty 1

## 2016-08-07 NOTE — ED Provider Notes (Signed)
[facility] DEPT Provider Note   CSN: [id number] Arrival date & time: 08/07/16  1103     History   Chief Complaint Chief Complaint  Patient presents with  . Fall; Mouth Pain    HPI Darrel Gloss Rehman is a 76 y.o. male previous history of stroke, PE on Coumadin, high cholesterol, hypertension here presenting with fall. Patient was at home and tripped over his foot and fell face forward. He did try to brace himself with his right hand but still hit his face. He was wearing dentures at that time and noticed some bleeding from his nose as well as right lip laceration. The bleeding has since then stopped. Patient states that he is up-to-date with his tetanus shot. Family witnessed this and adamantly denies passing out. Patient on Coumadin  The history is provided by the patient.    Past Medical History:  Diagnosis Date  . Anxiety and depression   . Back pain   . Brain bleed ([place])    2003  . CVA (cerebral infarction)    2001  . Hypercholesteremia   . Hypertension   . Peripheral neuropathy ([place])   . Pulmonary embolism ([place])   . Sleep apnea     Patient Active Problem List   Diagnosis Date Noted  . Long term current use of anticoagulant 02/08/2016  . Right hip pain 09/12/2015  . Abnormality of gait 08/28/2015  . Head trauma 08/28/2015  . Stroke ([place]) 08/28/2015  . Neck pain 08/28/2015  . Low back pain 08/28/2015    Past Surgical History:  Procedure Laterality Date  . HERNIA REPAIR    . LEG SURGERY     Broken femur  . TOTAL HIP ARTHROPLASTY     Right       Home Medications    Prior to Admission medications   Medication Sig Start Date End Date Taking? Authorizing Provider  amLODipine (NORVASC) 10 MG tablet Take 10 mg by mouth at bedtime.   Yes [provider]  atorvastatin (LIPITOR) 40 MG tablet Take 40 mg by mouth daily.   Yes [provider]  baclofen (LIORESAL) 10 MG tablet Take 1 tablet (10 mg total) by mouth at bedtime. 04/25/16  Yes Gerda Diss, DO  citalopram (CELEXA) 40 MG tablet Take 40 mg by mouth at bedtime.   Yes [provider]  fish oil-omega-3 fatty acids 1000 MG capsule Take 1 g by mouth daily.   Yes [provider]  gabapentin (NEURONTIN) 100 MG capsule TAKE ONE CAPSULE BY MOUTH 2 TIMES A DAY AS DIRECTED 08/09/15  Yes [provider]  losartan-hydrochlorothiazide (HYZAAR) 100-25 MG per tablet Take 1 tablet by mouth daily.   Yes [provider]  Multiple Vitamin (MULTIVITAMIN WITH MINERALS) TABS tablet Take 1 tablet by mouth daily.   Yes [provider]  pantoprazole (PROTONIX) 40 MG tablet Take 40 mg by mouth daily.  02/15/16  Yes [provider]  traMADol (ULTRAM) 50 MG tablet Take 1 tablet (50 mg total) by mouth every 6 (six) hours as needed. 04/25/16  Yes Gerda Diss, DO  traZODone (DESYREL) 50 MG tablet Take 50 mg by mouth at bedtime.   Yes [provider]  vitamin B-12 (CYANOCOBALAMIN) 1000 MCG tablet Take 1,000 mcg by mouth daily.   Yes [provider]  warfarin (COUMADIN) 7.5 MG tablet Take 7.5 mg by mouth daily at 6 PM.    Yes [provider]    Family History Family History  Problem  Relation Age of Onset  . Heart attack Father        Mother - health hx unknown    Social History Social History  Substance Use Topics  . Smoking status: Former Smoker    Quit date: 03/24/2006  . Smokeless tobacco: Never Used  . Alcohol use 0.0 oz/week     Comment: 1 beer per day     Allergies   Atenolol; Lotensin [benazepril hcl]; and Wellbutrin [bupropion]   Review of Systems Review of Systems  HENT: Positive for nosebleeds.   All other systems reviewed and are negative.    Physical Exam Updated Vital Signs BP 121/65 (BP Location: Left Arm)   Pulse 60   Temp 98.3 F (36.8 C) (Oral)   Resp 18   Ht 5\' 10"  (1.778 m)   Wt 185 lb (83.9 kg)   SpO2 94%   BMI 26.54 kg/m   Physical Exam  Constitutional: He is oriented to  person, place, and time. He appears well-developed.  HENT:  Head: Normocephalic.  Dry blood in L nostril, no obvious septal hematoma. Small laceration inside R upper lip with hematoma, doesn't extend to the vermilion border. Dentures removed on the upper aspect, missing multiple teeth on lower aspect (not from today, they were removed previously)   Eyes: EOM are normal. Pupils are equal, round, and reactive to light.  Neck: Normal range of motion.  Cardiovascular: Normal rate, regular rhythm and normal heart sounds.   Pulmonary/Chest: Effort normal and breath sounds normal. No respiratory distress. He has no wheezes.  Abdominal: Soft. Bowel sounds are normal. He exhibits no distension. There is no tenderness.  Musculoskeletal:  R palm bruising. Able to hand grasp, no obvious deformity. No spinal tenderness, no other extremity trauma   Neurological: He is alert and oriented to person, place, and time.  Skin: Skin is warm.  Nursing note and vitals reviewed.    ED Treatments / Results  Labs (all labs ordered are listed, but only abnormal results are displayed) Labs Reviewed  CBC WITH DIFFERENTIAL/PLATELET - Abnormal; Notable for the following:       Result Value   RBC 4.17 (*)    HCT 38.2 (*)    All other components within normal limits  BASIC METABOLIC PANEL - Abnormal; Notable for the following:    Potassium 5.2 (*)    BUN 33 (*)    Creatinine, Ser 1.81 (*)    GFR calc non Af Amer 35 (*)    GFR calc Af Amer 40 (*)    All other components within normal limits  PROTIME-INR - Abnormal; Notable for the following:    Prothrombin Time 22.3 (*)    All other components within normal limits    EKG  EKG Interpretation None       Radiology Ct Head Wo Contrast  Result Date: 08/07/2016 CLINICAL DATA:  Fall, facial injury, left laceration, epistaxis EXAM: CT HEAD WITHOUT CONTRAST CT MAXILLOFACIAL WITHOUT CONTRAST CT CERVICAL SPINE WITHOUT CONTRAST TECHNIQUE: Multidetector CT imaging  of the head, cervical spine, and maxillofacial structures were performed using the standard protocol without intravenous contrast. Multiplanar CT image reconstructions of the cervical spine and maxillofacial structures were also generated. COMPARISON:  CT head/ cervical spine dated 10/26/2014 FINDINGS: CT HEAD FINDINGS Brain: No evidence of acute infarction, hemorrhage, hydrocephalus, extra-axial collection or mass lesion/mass effect. Global cortical atrophy. Subcortical white matter and periventricular small vessel ischemic changes. Bilateral basal ganglia and left thalamic lacunar infarcts. Vascular: Intracranial atherosclerosis. Skull: Normal. Negative  for fracture or focal lesion. Other: None. CT MAXILLOFACIAL FINDINGS Osseous: No evidence of maxillofacial fracture. Mandible is intact. Bilateral mandibular condyles are well-seated in the TMJs. Orbits: Bilateral orbits, including the globes and retroconal soft tissues, are within normal limits. Sinuses: The visualized paranasal sinuses are essentially clear. The mastoid air cells are unopacified. Soft tissues: Negative. CT CERVICAL SPINE FINDINGS Alignment: Normal cervical lordosis. Skull base and vertebrae: No acute fracture. No primary bone lesion or focal pathologic process. Soft tissues and spinal canal: No prevertebral fluid or swelling. No visible canal hematoma. Disc levels: Mild degenerative changes of the mid/lower cervical spine. Spinal canal is patent. Upper chest: Visualized lung apices are notable for mild paraseptal emphysematous changes. Other: Visualized thyroid is unremarkable. IMPRESSION: No evidence of acute intracranial abnormality. Atrophy with small vessel ischemic changes. Bilateral basal ganglia and left the thalamic lacunar infarcts. No evidence of maxillofacial fracture. No evidence of traumatic injury to the cervical spine. Mild degenerative changes. Electronically Signed   By: Julian Hy M.D.   On: 08/07/2016 12:31   Ct  Cervical Spine Wo Contrast  Result Date: 08/07/2016 CLINICAL DATA:  Fall, facial injury, left laceration, epistaxis EXAM: CT HEAD WITHOUT CONTRAST CT MAXILLOFACIAL WITHOUT CONTRAST CT CERVICAL SPINE WITHOUT CONTRAST TECHNIQUE: Multidetector CT imaging of the head, cervical spine, and maxillofacial structures were performed using the standard protocol without intravenous contrast. Multiplanar CT image reconstructions of the cervical spine and maxillofacial structures were also generated. COMPARISON:  CT head/ cervical spine dated 10/26/2014 FINDINGS: CT HEAD FINDINGS Brain: No evidence of acute infarction, hemorrhage, hydrocephalus, extra-axial collection or mass lesion/mass effect. Global cortical atrophy. Subcortical white matter and periventricular small vessel ischemic changes. Bilateral basal ganglia and left thalamic lacunar infarcts. Vascular: Intracranial atherosclerosis. Skull: Normal. Negative for fracture or focal lesion. Other: None. CT MAXILLOFACIAL FINDINGS Osseous: No evidence of maxillofacial fracture. Mandible is intact. Bilateral mandibular condyles are well-seated in the TMJs. Orbits: Bilateral orbits, including the globes and retroconal soft tissues, are within normal limits. Sinuses: The visualized paranasal sinuses are essentially clear. The mastoid air cells are unopacified. Soft tissues: Negative. CT CERVICAL SPINE FINDINGS Alignment: Normal cervical lordosis. Skull base and vertebrae: No acute fracture. No primary bone lesion or focal pathologic process. Soft tissues and spinal canal: No prevertebral fluid or swelling. No visible canal hematoma. Disc levels: Mild degenerative changes of the mid/lower cervical spine. Spinal canal is patent. Upper chest: Visualized lung apices are notable for mild paraseptal emphysematous changes. Other: Visualized thyroid is unremarkable. IMPRESSION: No evidence of acute intracranial abnormality. Atrophy with small vessel ischemic changes. Bilateral basal  ganglia and left the thalamic lacunar infarcts. No evidence of maxillofacial fracture. No evidence of traumatic injury to the cervical spine. Mild degenerative changes. Electronically Signed   By: Julian Hy M.D.   On: 08/07/2016 12:31   Dg Hand Complete Right  Result Date: 08/07/2016 CLINICAL DATA:  Fall at home this a.m and tried to catch self with rt hand and hitting face on concrete has rt hand pain, bruising to palmar surface EXAM: RIGHT HAND - COMPLETE 3+ VIEW COMPARISON:  None. FINDINGS: No evidence of fracture of the carpal or metacarpal bones. Radiocarpal joint is intact. Phalanges are normal. No soft tissue injury. IMPRESSION: No fracture or dislocation. Electronically Signed   By: Suzy Bouchard M.D.   On: 08/07/2016 12:09   Ct Maxillofacial Wo Contrast  Result Date: 08/07/2016 CLINICAL DATA:  Fall, facial injury, left laceration, epistaxis EXAM: CT HEAD WITHOUT CONTRAST CT MAXILLOFACIAL WITHOUT CONTRAST CT  CERVICAL SPINE WITHOUT CONTRAST TECHNIQUE: Multidetector CT imaging of the head, cervical spine, and maxillofacial structures were performed using the standard protocol without intravenous contrast. Multiplanar CT image reconstructions of the cervical spine and maxillofacial structures were also generated. COMPARISON:  CT head/ cervical spine dated 10/26/2014 FINDINGS: CT HEAD FINDINGS Brain: No evidence of acute infarction, hemorrhage, hydrocephalus, extra-axial collection or mass lesion/mass effect. Global cortical atrophy. Subcortical white matter and periventricular small vessel ischemic changes. Bilateral basal ganglia and left thalamic lacunar infarcts. Vascular: Intracranial atherosclerosis. Skull: Normal. Negative for fracture or focal lesion. Other: None. CT MAXILLOFACIAL FINDINGS Osseous: No evidence of maxillofacial fracture. Mandible is intact. Bilateral mandibular condyles are well-seated in the TMJs. Orbits: Bilateral orbits, including the globes and retroconal soft  tissues, are within normal limits. Sinuses: The visualized paranasal sinuses are essentially clear. The mastoid air cells are unopacified. Soft tissues: Negative. CT CERVICAL SPINE FINDINGS Alignment: Normal cervical lordosis. Skull base and vertebrae: No acute fracture. No primary bone lesion or focal pathologic process. Soft tissues and spinal canal: No prevertebral fluid or swelling. No visible canal hematoma. Disc levels: Mild degenerative changes of the mid/lower cervical spine. Spinal canal is patent. Upper chest: Visualized lung apices are notable for mild paraseptal emphysematous changes. Other: Visualized thyroid is unremarkable. IMPRESSION: No evidence of acute intracranial abnormality. Atrophy with small vessel ischemic changes. Bilateral basal ganglia and left the thalamic lacunar infarcts. No evidence of maxillofacial fracture. No evidence of traumatic injury to the cervical spine. Mild degenerative changes. Electronically Signed   By: Julian Hy M.D.   On: 08/07/2016 12:31    Procedures Procedures (including critical care time)  LACERATION REPAIR Performed by: Wandra Arthurs Authorized by: Wandra Arthurs Consent: Verbal consent obtained. Risks and benefits: risks, benefits and alternatives were discussed Consent given by: patient Patient identity confirmed: provided demographic data Prepped and Draped in normal sterile fashion Wound explored  Laceration Location: upper lip  Laceration Length: 0.5 cm  No Foreign Bodies seen or palpated  Anesthesia: none  Local anesthetic: none   Irrigation method: syringe Amount of cleaning: standard  Skin closure: dermabond   Patient tolerance: Patient tolerated the procedure well with no immediate complications.   Medications Ordered in ED Medications  traMADol (ULTRAM) tablet 50 mg (50 mg Oral Given 08/07/16 1337)     Initial Impression / Assessment and Plan / ED Course  I have reviewed the triage vital signs and the nursing  notes.  Pertinent labs & imaging results that were available during my care of the patient were reviewed by me and considered in my medical decision making (see chart for details).     Olanrewaju Osborn Vivanco is a 76 y.o. male on coumadin here with fall. Had nosebleed that stopped, has R upper inner lip laceration and bleeding stopped. Tdap up to date. Will get CT head/neck/face. Will check cbc, INR. Laceration doesn't require suture currently.    2:25 PM Hg 13. INR 1.9. CT showed no fractures. I dermabond upper lip due to some bleeding. Now that it is controlled. Will dc home.   Final Clinical Impressions(s) / ED Diagnoses   Final diagnoses:  None    New Prescriptions New Prescriptions   No medications on file     Drenda Freeze, MD 08/07/16 1427

## 2016-08-07 NOTE — ED Triage Notes (Signed)
Pt was sitting in the garage at home and tried to get up and fell face first into the concrete. Pt wife states that pt nose was bleeding. Pt has a lip laceration on the right side and pt right had is bruised. Pt takes warfarin at home due to hx of a stroke and a PE.

## 2016-08-07 NOTE — Discharge Instructions (Signed)
Continue tramadol for pain.   Expect lip swelling to get slightly worse. Apply ice on it.   Your kidney function is slightly abnormal. Repeat with your doctor in a week.   Your INR is 1.9 now.   See your doctor.   Return to ER if you have more bleeding, severe pain, vomiting.

## 2016-08-12 ENCOUNTER — Other Ambulatory Visit: Payer: Self-pay

## 2016-08-12 DIAGNOSIS — G8929 Other chronic pain: Secondary | ICD-10-CM

## 2016-08-12 DIAGNOSIS — M5441 Lumbago with sciatica, right side: Principal | ICD-10-CM

## 2016-08-12 DIAGNOSIS — Z86718 Personal history of other venous thrombosis and embolism: Secondary | ICD-10-CM | POA: Diagnosis not present

## 2016-08-12 DIAGNOSIS — M5442 Lumbago with sciatica, left side: Principal | ICD-10-CM

## 2016-08-12 DIAGNOSIS — R600 Localized edema: Secondary | ICD-10-CM | POA: Diagnosis not present

## 2016-08-12 DIAGNOSIS — I10 Essential (primary) hypertension: Secondary | ICD-10-CM | POA: Diagnosis not present

## 2016-08-12 DIAGNOSIS — G4733 Obstructive sleep apnea (adult) (pediatric): Secondary | ICD-10-CM | POA: Diagnosis not present

## 2016-08-12 MED ORDER — BACLOFEN 10 MG PO TABS: 10.0000 mg | ORAL_TABLET | Freq: Every day | ORAL | 0 refills | Status: DC

## 2016-08-19 DIAGNOSIS — Z7901 Long term (current) use of anticoagulants: Secondary | ICD-10-CM | POA: Diagnosis not present

## 2016-08-19 DIAGNOSIS — Z86718 Personal history of other venous thrombosis and embolism: Secondary | ICD-10-CM | POA: Diagnosis not present

## 2016-08-19 DIAGNOSIS — I10 Essential (primary) hypertension: Secondary | ICD-10-CM | POA: Diagnosis not present

## 2016-09-02 DIAGNOSIS — Z7901 Long term (current) use of anticoagulants: Secondary | ICD-10-CM | POA: Diagnosis not present

## 2016-09-02 DIAGNOSIS — Z86718 Personal history of other venous thrombosis and embolism: Secondary | ICD-10-CM | POA: Diagnosis not present

## 2016-09-08 DIAGNOSIS — E7800 Pure hypercholesterolemia, unspecified: Secondary | ICD-10-CM | POA: Diagnosis not present

## 2016-09-09 DIAGNOSIS — R1310 Dysphagia, unspecified: Secondary | ICD-10-CM | POA: Diagnosis not present

## 2016-09-11 ENCOUNTER — Other Ambulatory Visit (HOSPITAL_COMMUNITY): Payer: Self-pay | Admitting: Internal Medicine

## 2016-09-11 DIAGNOSIS — R1319 Other dysphagia: Secondary | ICD-10-CM

## 2016-09-17 DIAGNOSIS — E7800 Pure hypercholesterolemia, unspecified: Secondary | ICD-10-CM | POA: Diagnosis not present

## 2016-09-17 DIAGNOSIS — I10 Essential (primary) hypertension: Secondary | ICD-10-CM | POA: Diagnosis not present

## 2016-09-17 DIAGNOSIS — K219 Gastro-esophageal reflux disease without esophagitis: Secondary | ICD-10-CM | POA: Diagnosis not present

## 2016-09-17 DIAGNOSIS — N183 Chronic kidney disease, stage 3 (moderate): Secondary | ICD-10-CM | POA: Diagnosis not present

## 2016-09-25 ENCOUNTER — Ambulatory Visit (HOSPITAL_COMMUNITY)
Admission: RE | Admit: 2016-09-25 | Discharge: 2016-09-25 | Disposition: A | Discharge status: Home / Self Care | Payer: Medicare Other | Source: Ambulatory Visit | Attending: Internal Medicine | Admitting: Internal Medicine

## 2016-09-25 DIAGNOSIS — R1319 Other dysphagia: Secondary | ICD-10-CM | POA: Diagnosis not present

## 2016-09-25 DIAGNOSIS — R1314 Dysphagia, pharyngoesophageal phase: Secondary | ICD-10-CM | POA: Diagnosis not present

## 2016-09-25 DIAGNOSIS — K225 Diverticulum of esophagus, acquired: Secondary | ICD-10-CM | POA: Insufficient documentation

## 2016-09-25 NOTE — Progress Notes (Signed)
Modified Barium Swallow Progress Note  Patient Details  Name: Steven Guerrero MRN: [id number] Date of Birth: November 15, 1940  Today's Date: 09/25/2016  Modified Barium Swallow completed.  Full report located under Chart Review in the Imaging Section.  Brief recommendations include the following:  Clinical Impression  Pt exhibited min-mild pharyngeal motor based dysphagia marked by slighlty decreased epiglottic inversion and laryngeal closure resulting in flash penetration with straw sips thin and minimal vallecular residue. Radiologist present and noted a small Zenkers diverticulum increasing aspiration risk post swallow. Pt/wife educated to remain upright following meals 1 hour, alternate liquids and solids, pills whole in applesauce and small bites/sips.     Swallow Evaluation Recommendations       SLP Diet Recommendations: Regular solids;Thin liquid   Liquid Administration via: Cup;Straw (cups preferred)   Medication Administration: Whole meds with puree   Supervision: Patient able to self feed;Full supervision/cueing for compensatory strategies   Compensations: Slow rate;Small sips/bites;Follow solids with liquid   Postural Changes: Remain semi-upright after after feeds/meals (Comment);Seated upright at 90 degrees   Oral Care Recommendations: Oral care BID        Houston Siren 09/25/2016,3:02 PM   Orbie Pyo Colvin Caroli.Ed [employer] [phone]

## 2016-09-29 DIAGNOSIS — Z86718 Personal history of other venous thrombosis and embolism: Secondary | ICD-10-CM | POA: Diagnosis not present

## 2016-09-29 DIAGNOSIS — Z7901 Long term (current) use of anticoagulants: Secondary | ICD-10-CM | POA: Diagnosis not present

## 2016-10-01 ENCOUNTER — Other Ambulatory Visit: Payer: Self-pay

## 2016-10-01 ENCOUNTER — Emergency Department (HOSPITAL_COMMUNITY)
Admission: EM | Admit: 2016-10-01 | Discharge: 2016-10-01 | Disposition: A | Discharge status: Home / Self Care | Payer: Medicare Other | Attending: Emergency Medicine | Admitting: Emergency Medicine

## 2016-10-01 ENCOUNTER — Encounter (HOSPITAL_COMMUNITY): Payer: Self-pay | Admitting: Emergency Medicine

## 2016-10-01 ENCOUNTER — Emergency Department (HOSPITAL_COMMUNITY): Payer: Medicare Other

## 2016-10-01 DIAGNOSIS — Z87891 Personal history of nicotine dependence: Secondary | ICD-10-CM | POA: Diagnosis not present

## 2016-10-01 DIAGNOSIS — M542 Cervicalgia: Secondary | ICD-10-CM | POA: Diagnosis not present

## 2016-10-01 DIAGNOSIS — Y939 Activity, unspecified: Secondary | ICD-10-CM | POA: Insufficient documentation

## 2016-10-01 DIAGNOSIS — Y999 Unspecified external cause status: Secondary | ICD-10-CM | POA: Insufficient documentation

## 2016-10-01 DIAGNOSIS — Z8673 Personal history of transient ischemic attack (TIA), and cerebral infarction without residual deficits: Secondary | ICD-10-CM | POA: Diagnosis not present

## 2016-10-01 DIAGNOSIS — S199XXA Unspecified injury of neck, initial encounter: Secondary | ICD-10-CM | POA: Diagnosis not present

## 2016-10-01 DIAGNOSIS — Z7901 Long term (current) use of anticoagulants: Secondary | ICD-10-CM

## 2016-10-01 DIAGNOSIS — R079 Chest pain, unspecified: Secondary | ICD-10-CM | POA: Diagnosis not present

## 2016-10-01 DIAGNOSIS — S0990XA Unspecified injury of head, initial encounter: Secondary | ICD-10-CM | POA: Insufficient documentation

## 2016-10-01 DIAGNOSIS — W19XXXA Unspecified fall, initial encounter: Secondary | ICD-10-CM

## 2016-10-01 DIAGNOSIS — Y92009 Unspecified place in unspecified non-institutional (private) residence as the place of occurrence of the external cause: Secondary | ICD-10-CM | POA: Insufficient documentation

## 2016-10-01 DIAGNOSIS — S51811A Laceration without foreign body of right forearm, initial encounter: Secondary | ICD-10-CM | POA: Insufficient documentation

## 2016-10-01 DIAGNOSIS — R51 Headache: Secondary | ICD-10-CM | POA: Diagnosis not present

## 2016-10-01 DIAGNOSIS — W01198A Fall on same level from slipping, tripping and stumbling with subsequent striking against other object, initial encounter: Secondary | ICD-10-CM | POA: Insufficient documentation

## 2016-10-01 DIAGNOSIS — S59911A Unspecified injury of right forearm, initial encounter: Secondary | ICD-10-CM | POA: Diagnosis not present

## 2016-10-01 DIAGNOSIS — S8992XA Unspecified injury of left lower leg, initial encounter: Secondary | ICD-10-CM | POA: Diagnosis not present

## 2016-10-01 DIAGNOSIS — S0993XA Unspecified injury of face, initial encounter: Secondary | ICD-10-CM | POA: Diagnosis not present

## 2016-10-01 DIAGNOSIS — M25562 Pain in left knee: Secondary | ICD-10-CM

## 2016-10-01 DIAGNOSIS — I10 Essential (primary) hypertension: Secondary | ICD-10-CM | POA: Insufficient documentation

## 2016-10-01 DIAGNOSIS — Z5181 Encounter for therapeutic drug level monitoring: Secondary | ICD-10-CM | POA: Diagnosis not present

## 2016-10-01 DIAGNOSIS — R072 Precordial pain: Secondary | ICD-10-CM | POA: Insufficient documentation

## 2016-10-01 LAB — BASIC METABOLIC PANEL
Anion gap: 9 (ref 5–15)
BUN: 32 mg/dL — AB (ref 6–20)
CALCIUM: 9.1 mg/dL (ref 8.9–10.3)
CHLORIDE: 104 mmol/L (ref 101–111)
CO2: 25 mmol/L (ref 22–32)
CREATININE: 1.8 mg/dL — AB (ref 0.61–1.24)
GFR calc non Af Amer: 35 mL/min — ABNORMAL LOW (ref >60)
GFR, EST AFRICAN AMERICAN: 40 mL/min — AB (ref >60)
Glucose, Bld: 106 mg/dL — ABNORMAL HIGH (ref 65–99)
Potassium: 3.6 mmol/L (ref 3.5–5.1)
SODIUM: 138 mmol/L (ref 135–145)

## 2016-10-01 LAB — CBC WITH DIFFERENTIAL/PLATELET
BASOS PCT: 0 %
Basophils Absolute: 0 10*3/uL (ref 0.0–0.1)
EOS PCT: 2 %
Eosinophils Absolute: 0.1 10*3/uL (ref 0.0–0.7)
HCT: 38.2 % — ABNORMAL LOW (ref 39.0–52.0)
HEMOGLOBIN: 12.9 g/dL — AB (ref 13.0–17.0)
LYMPHS ABS: 1.4 10*3/uL (ref 0.7–4.0)
Lymphocytes Relative: 20 %
MCH: 31.5 pg (ref 26.0–34.0)
MCHC: 33.8 g/dL (ref 30.0–36.0)
MCV: 93.2 fL (ref 78.0–100.0)
Monocytes Absolute: 0.8 10*3/uL (ref 0.1–1.0)
Monocytes Relative: 12 %
NEUTROS PCT: 66 %
Neutro Abs: 4.7 10*3/uL (ref 1.7–7.7)
Platelets: 215 10*3/uL (ref 150–400)
RBC: 4.1 MIL/uL — AB (ref 4.22–5.81)
RDW: 14.1 % (ref 11.5–15.5)
WBC: 7.1 10*3/uL (ref 4.0–10.5)

## 2016-10-01 LAB — I-STAT TROPONIN, ED
TROPONIN I, POC: 0 ng/mL (ref 0.00–0.08)
TROPONIN I, POC: 0 ng/mL (ref 0.00–0.08)

## 2016-10-01 LAB — PROTIME-INR
INR: 1.87
Prothrombin Time: 21.8 seconds — ABNORMAL HIGH (ref 11.4–15.2)

## 2016-10-01 MED ORDER — BACITRACIN ZINC 500 UNIT/GM EX OINT: 1.0000 "application " | TOPICAL_OINTMENT | Freq: Two times a day (BID) | CUTANEOUS | 1 refills | Status: DC

## 2016-10-01 MED ORDER — LIDOCAINE-EPINEPHRINE (PF) 2 %-1:200000 IJ SOLN
10.0000 mL | Freq: Once | INTRAMUSCULAR | Status: AC
  Administered 2016-10-01: 10 mL
  Filled 2016-10-01: qty 20

## 2016-10-01 NOTE — ED Notes (Signed)
Provider at bedside

## 2016-10-01 NOTE — ED Notes (Signed)
All fall precautions provided.

## 2016-10-01 NOTE — ED Provider Notes (Signed)
[facility] DEPT Provider Note   CSN: [id number] Arrival date & time: 10/01/16  1356     History   Chief Complaint Chief Complaint  Patient presents with  . Fall  . Arm Pain  . Knee Pain  . Facial Pain  . Facial Laceration  . Laceration    HPI Steven Guerrero is a 76 y.o. male.  Steven Guerrero is a 76 y.o. Male with a history of PE on Coumadin, previous CVA who presents to the emergency department after a trip and fall prior to arrival today. Patient and spouse report that he tripped on the lip of the dorsal today. He fell onto the ground hitting his face and cutting his right arm on the door lock. He denies losing consciousness. He denies having lightheaded or dizzy or syncope. He also reports after arriving to the emergency department today he developed some chest pain. He reports that it is mild and left-sided. He denies any associated shortness of breath. He had no chest pain or SOB prior to the fall. He complains of pain to his right wrist, forearm, left knee, and left side of his face and head. He denies any new neck pain. He denies numbness, tingling, weakness, fevers, shortness of breath, palpitations, abdominal pain, nausea, vomiting, lightheadedness, syncope, dizziness, changes to his vision or neck pain.   The history is provided by the patient, medical records and the spouse. No language interpreter was used.  Fall  Associated symptoms include chest pain and headaches. Pertinent negatives include no abdominal pain and no shortness of breath.  Arm Pain  Associated symptoms include chest pain and headaches. Pertinent negatives include no abdominal pain and no shortness of breath.  Knee Pain   Pertinent negatives include no numbness.  Laceration      Past Medical History:  Diagnosis Date  . Anxiety and depression   . Back pain   . Brain bleed ([place])    2003  . CVA (cerebral infarction)    2001  . Hypercholesteremia   . Hypertension   . Peripheral neuropathy   .  Pulmonary embolism ([place])   . Sleep apnea     Patient Active Problem List   Diagnosis Date Noted  . Long term current use of anticoagulant 02/08/2016  . Right hip pain 09/12/2015  . Abnormality of gait 08/28/2015  . Head trauma 08/28/2015  . Stroke ([place]) 08/28/2015  . Neck pain 08/28/2015  . Low back pain 08/28/2015    Past Surgical History:  Procedure Laterality Date  . HERNIA REPAIR    . LEG SURGERY     Broken femur  . TOTAL HIP ARTHROPLASTY     Right       Home Medications    Prior to Admission medications   Medication Sig Start Date End Date Taking? Authorizing Provider  amLODipine (NORVASC) 10 MG tablet Take 10 mg by mouth at bedtime.   Yes [provider]  atorvastatin (LIPITOR) 40 MG tablet Take 40 mg by mouth daily.   Yes [provider]  baclofen (LIORESAL) 10 MG tablet Take 1 tablet (10 mg total) by mouth at bedtime. 08/12/16  Yes Gerda Diss, DO  citalopram (CELEXA) 40 MG tablet Take 40 mg by mouth at bedtime.   Yes [provider]  fish oil-omega-3 fatty acids 1000 MG capsule Take 1 g by mouth daily.   Yes [provider]  gabapentin (NEURONTIN) 100 MG capsule TAKE ONE CAPSULE BY MOUTH 2 TIMES A DAY AS  DIRECTED 08/09/15  Yes [provider]  losartan-hydrochlorothiazide (HYZAAR) 100-25 MG per tablet Take 1 tablet by mouth daily.   Yes [provider]  Multiple Vitamin (MULTIVITAMIN WITH MINERALS) TABS tablet Take 1 tablet by mouth daily.   Yes [provider]  OVER THE COUNTER MEDICATION Take 1 tablet by mouth daily. Vitamin for vision   Yes [provider]  pantoprazole (PROTONIX) 40 MG tablet Take 40 mg by mouth daily.  02/15/16  Yes [provider]  traMADol (ULTRAM) 50 MG tablet Take 1 tablet (50 mg total) by mouth every 6 (six) hours as needed. Patient taking differently: Take 100 mg by mouth at bedtime.  04/25/16  Yes Gerda Diss, DO  traZODone (DESYREL) 50 MG tablet Take  50 mg by mouth at bedtime.   Yes [provider]  vitamin B-12 (CYANOCOBALAMIN) 1000 MCG tablet Take 1,000 mcg by mouth daily.   Yes [provider]  warfarin (COUMADIN) 7.5 MG tablet Take 7.5 mg by mouth daily at 6 PM.    Yes [provider]  bacitracin ointment Apply 1 application topically 2 (two) times daily. 10/01/16   Waynetta Pean, PA-C    Family History Family History  Problem Relation Age of Onset  . Heart attack Father        Mother - health hx unknown    Social History Social History  Substance Use Topics  . Smoking status: Former Smoker    Quit date: 03/24/2006  . Smokeless tobacco: Never Used  . Alcohol use 0.0 oz/week     Comment: 1 beer per day     Allergies   Atenolol; Lotensin [benazepril hcl]; and Wellbutrin [bupropion]   Review of Systems Review of Systems  Constitutional: Negative for chills and fever.  HENT: Positive for facial swelling. Negative for congestion, ear pain, nosebleeds and sore throat.   Eyes: Negative for visual disturbance.  Respiratory: Negative for cough and shortness of breath.   Cardiovascular: Positive for chest pain. Negative for palpitations and leg swelling.  Gastrointestinal: Negative for abdominal pain, diarrhea, nausea and vomiting.  Genitourinary: Negative for dysuria.  Musculoskeletal: Positive for arthralgias. Negative for back pain and neck pain.  Skin: Positive for wound. Negative for rash.  Neurological: Positive for headaches. Negative for dizziness, seizures, syncope, weakness, light-headedness and numbness.     Physical Exam Updated Vital Signs BP (!) 146/93   Pulse 73   Temp (!) 97.4 F (36.3 C) (Oral)   Resp 15   Ht 5' 11.5" (1.816 m)   Wt 83.9 kg (185 lb)   SpO2 99%   BMI 25.44 kg/m   Physical Exam  Constitutional: He is oriented to person, place, and time. He appears well-developed and well-nourished. No distress.  Nontoxic appearing.  HENT:  Head: Normocephalic.  Right  Ear: External ear normal.  Left Ear: External ear normal.  Mouth/Throat: Oropharynx is clear and moist.  Abrasion noted to project nose. Ecchymosis noted beneath the left eye. EOMs are intact. No crepitus noted to face. Throat is clear. No septal hematoma. No hemotympanum.  Eyes: Conjunctivae and EOM are normal. Pupils are equal, round, and reactive to light. Right eye exhibits no discharge. Left eye exhibits no discharge.  Vision is grossly intact. EOMs are intact.  Neck: Normal range of motion. Neck supple. No JVD present. No tracheal deviation present.  No midline neck tenderness to palpation.  Cardiovascular: Normal rate, regular rhythm, normal heart sounds and intact distal pulses.  Exam reveals no gallop and  no friction rub.   No murmur heard. Pulmonary/Chest: Effort normal and breath sounds normal. No stridor. No respiratory distress. He has no wheezes. He has no rales. He exhibits no tenderness.  Lungs are clear to ascultation bilaterally. Symmetric chest expansion bilaterally. No increased work of breathing. No rales or rhonchi.  No chest wall TTP.   Abdominal: Soft. There is no tenderness. There is no guarding.  Musculoskeletal: Normal range of motion. He exhibits tenderness. He exhibits no edema or deformity.  Avulsion and laceration noted to his right forearm. Patient never any point tenderness noted to his upper extremities. Good range of motion of the shoulders. No elbow tenderness bilaterally. No wrist tenderness bilaterally. Some ecchymosis noted to his anterior aspect of his left knee. Good range of motion of his left knee without instability. No midline neck or back tenderness to palpation.  Lymphadenopathy:    He has no cervical adenopathy.  Neurological: He is alert and oriented to person, place, and time. No cranial nerve deficit or sensory deficit. He exhibits normal muscle tone. Coordination normal.  Patient is alert and oriented 3. Cranial nerves are intact. Speech is clear  and coherent. No pronator drift. Good and equal grip strength bilaterally. Strength and sensation is intact in his bilateral upper and lower extremities.  Skin: Skin is warm and dry. Capillary refill takes less than 2 seconds. No rash noted. He is not diaphoretic. No erythema. No pallor.  Psychiatric: He has a normal mood and affect. His behavior is normal.  Nursing note and vitals reviewed.    ED Treatments / Results  Labs (all labs ordered are listed, but only abnormal results are displayed) Labs Reviewed  BASIC METABOLIC PANEL - Abnormal; Notable for the following:       Result Value   Glucose, Bld 106 (*)    BUN 32 (*)    Creatinine, Ser 1.80 (*)    GFR calc non Af Amer 35 (*)    GFR calc Af Amer 40 (*)    All other components within normal limits  CBC WITH DIFFERENTIAL/PLATELET - Abnormal; Notable for the following:    RBC 4.10 (*)    Hemoglobin 12.9 (*)    HCT 38.2 (*)    All other components within normal limits  PROTIME-INR - Abnormal; Notable for the following:    Prothrombin Time 21.8 (*)    All other components within normal limits  I-STAT TROPOININ, ED  I-STAT TROPOININ, ED    EKG  EKG Interpretation None       Radiology Dg Chest 2 View  Result Date: 10/01/2016 CLINICAL DATA:  76 y/o  M; fall with chest pain. EXAM: CHEST  2 VIEW COMPARISON:  10/26/2014 chest radiograph. FINDINGS: Stable cardiac silhouette. Aortic atherosclerosis with calcification. Clear lungs. No pleural effusion or pneumothorax. Chronic left lateral rib fractures. No acute osseous abnormality is evident. IMPRESSION: Chronic left lateral rib fractures. No acute osseous abnormality is evident. No acute pulmonary process. Electronically Signed   By: Kristine Garbe M.D.   On: 10/01/2016 17:40   Dg Forearm Right  Result Date: 10/01/2016 CLINICAL DATA:  Fall, laceration, on Coumadin EXAM: RIGHT FOREARM - 2 VIEW COMPARISON:  None. FINDINGS: No fracture or dislocation is seen. The joint  spaces are preserved. Visualized soft tissues are within normal limits. No radiopaque foreign body is seen. IMPRESSION: No fracture, dislocation, or radiopaque foreign body is seen. Electronically Signed   By: Julian Hy M.D.   On: 10/01/2016 17:38   Ct Head  Wo Contrast  Result Date: 10/01/2016 CLINICAL DATA:  Fall today. Pain on the left side of head. No other complaints. EXAM: CT HEAD WITHOUT CONTRAST TECHNIQUE: Contiguous axial images were obtained from the base of the skull through the vertex without intravenous contrast. COMPARISON:  None. FINDINGS: Brain: No evidence of acute infarction, hemorrhage, extra-axial collection, ventriculomegaly, or mass effect. Generalized cerebral atrophy. Periventricular white matter low attenuation likely secondary to microangiopathy. Vascular: Cerebrovascular atherosclerotic calcifications are noted. Skull: Negative for fracture or focal lesion. Sinuses/Orbits: Visualized portions of the orbits are unremarkable. Visualized portions of the paranasal sinuses and mastoid air cells are unremarkable. Other: None. IMPRESSION: 1. No acute intracranial pathology. 2. Chronic microvascular disease and cerebral atrophy. Electronically Signed   By: Kathreen Devoid   On: 10/01/2016 16:03   Ct Cervical Spine Wo Contrast  Result Date: 10/01/2016 CLINICAL DATA:  Fall with facial pain. Laceration. On anticoagulation. EXAM: CT MAXILLOFACIAL WITHOUT CONTRAST CT CERVICAL SPINE WITHOUT CONTRAST TECHNIQUE: Multidetector CT imaging of the cervical spine and maxillofacial structures were performed using the standard protocol without intravenous contrast. Multiplanar CT image reconstructions of the cervical spine and maxillofacial structures were also generated. COMPARISON:  Non contrast CT 2 hours prior. Head, cervical spine and face CT 08/07/2016 FINDINGS: CT MAXILLOFACIAL FINDINGS Osseous: No acute fracture. Zygomatic arches, nasal bone and mandibles are intact. Deformity of the right  mandibular condyle may be sequela of remote prior injury, and is unchanged from prior exam. Majority of the teeth are absent, this 3 residual lower teeth are intact. Orbits: No orbital fracture. Both orbits and globes are intact. Bilateral cataract resection. Sinuses: Clear. Soft tissues: Minimal soft tissue edema in the bifrontal region. No confluent hematoma. CT CERVICAL SPINE FINDINGS Alignment: No traumatic subluxation.  Unchanged from prior exam. Skull base and vertebrae: No acute fracture. Vertebral body heights are maintained. The dens and skull base are intact. Soft tissues and spinal canal: No prevertebral fluid or swelling. No visible canal hematoma. Disc levels: Diffuse disc space narrowing and endplate spurring. Multilevel facet arthropathy. Degenerative changes are stable from prior exam. Upper chest: Emphysema with biapical pleuroparenchymal scarring. Other: Carotid calcifications. IMPRESSION: 1. No facial bone fracture. 2. No fracture or subluxation of the cervical spine. Electronically Signed   By: Jeb Levering M.D.   On: 10/01/2016 17:33   Dg Knee Complete 4 Views Left  Result Date: 10/01/2016 CLINICAL DATA:  Left knee pain after fall today. EXAM: LEFT KNEE - COMPLETE 4+ VIEW COMPARISON:  None. FINDINGS: No evidence of fracture, dislocation, or joint effusion. No evidence of arthropathy or other focal bone abnormality. Vascular calcifications are noted. IMPRESSION: No acute abnormality seen in left knee. Electronically Signed   By: Marijo Conception, M.D.   On: 10/01/2016 15:15   Ct Maxillofacial Wo Cm  Result Date: 10/01/2016 CLINICAL DATA:  Fall with facial pain. Laceration. On anticoagulation. EXAM: CT MAXILLOFACIAL WITHOUT CONTRAST CT CERVICAL SPINE WITHOUT CONTRAST TECHNIQUE: Multidetector CT imaging of the cervical spine and maxillofacial structures were performed using the standard protocol without intravenous contrast. Multiplanar CT image reconstructions of the cervical spine and  maxillofacial structures were also generated. COMPARISON:  Non contrast CT 2 hours prior. Head, cervical spine and face CT 08/07/2016 FINDINGS: CT MAXILLOFACIAL FINDINGS Osseous: No acute fracture. Zygomatic arches, nasal bone and mandibles are intact. Deformity of the right mandibular condyle may be sequela of remote prior injury, and is unchanged from prior exam. Majority of the teeth are absent, this 3 residual lower teeth are intact. Orbits:  No orbital fracture. Both orbits and globes are intact. Bilateral cataract resection. Sinuses: Clear. Soft tissues: Minimal soft tissue edema in the bifrontal region. No confluent hematoma. CT CERVICAL SPINE FINDINGS Alignment: No traumatic subluxation.  Unchanged from prior exam. Skull base and vertebrae: No acute fracture. Vertebral body heights are maintained. The dens and skull base are intact. Soft tissues and spinal canal: No prevertebral fluid or swelling. No visible canal hematoma. Disc levels: Diffuse disc space narrowing and endplate spurring. Multilevel facet arthropathy. Degenerative changes are stable from prior exam. Upper chest: Emphysema with biapical pleuroparenchymal scarring. Other: Carotid calcifications. IMPRESSION: 1. No facial bone fracture. 2. No fracture or subluxation of the cervical spine. Electronically Signed   By: Jeb Levering M.D.   On: 10/01/2016 17:33    Procedures .Marland KitchenLaceration Repair Date/Time: 10/01/2016 7:38 PM Performed by: Waynetta Pean Authorized by: Waynetta Pean   Consent:    Consent obtained:  Verbal   Consent given by:  Patient and spouse   Risks discussed:  Infection, poor cosmetic result, pain and need for additional repair Anesthesia (see MAR for exact dosages):    Anesthesia method:  Local infiltration   Local anesthetic:  Lidocaine 1% WITH epi Laceration details:    Location:  Shoulder/arm   Shoulder/arm location:  R lower arm   Length (cm):  5   Depth (mm):  3 Repair type:    Repair type:   Intermediate Pre-procedure details:    Preparation:  Patient was prepped and draped in usual sterile fashion and imaging obtained to evaluate for foreign bodies Exploration:    Hemostasis achieved with:  Direct pressure   Wound exploration: wound explored through full range of motion and entire depth of wound probed and visualized     Wound extent: no fascia violation noted, no foreign bodies/material noted and no muscle damage noted     Contaminated: no   Treatment:    Area cleansed with:  Saline   Amount of cleaning:  Extensive   Irrigation solution:  Sterile saline   Irrigation volume:  500 ml   Irrigation method:  Pressure wash Skin repair:    Repair method:  Sutures   Suture size:  4-0   Suture material:  Prolene   Suture technique:  Simple interrupted   Number of sutures:  4 Approximation:    Approximation:  Close Post-procedure details:    Dressing:  Non-adherent dressing   Patient tolerance of procedure:  Tolerated well, no immediate complications   (including critical care time)  Medications Ordered in ED Medications  lidocaine-EPINEPHrine (XYLOCAINE W/EPI) 2 %-1:200000 (PF) injection 10 mL (not administered)     Initial Impression / Assessment and Plan / ED Course  I have reviewed the triage vital signs and the nursing notes.  Pertinent labs & imaging results that were available during my care of the patient were reviewed by me and considered in my medical decision making (see chart for details).     This is a 76 y.o. Male with a history of PE on Coumadin, previous CVA who presents to the emergency department after a trip and fall prior to arrival today. Patient and spouse report that he tripped on the lip of the dorsal today. He fell onto the ground hitting his face and cutting his right arm on the door lock. He denies losing consciousness. He denies having lightheaded or dizzy or syncope. He also reports after arriving to the emergency department today he developed  some chest pain. He reports that it  is mild and left-sided. He denies any associated shortness of breath. He had no chest pain or SOB prior to the fall. He complains of pain to his right wrist, forearm, left knee, and left side of his face and head.  On exam the patient is afebrile and nontoxic appearing. He is noted to have some ecchymosis beneath his left eye. His abrasion and laceration noted to his right forearm. He is moving all extremity is without difficulty. He has no focal neurological deficits on exam. He reports developing chest pain after arriving to the emergency department. At reevaluation chest pain has resolved. Chest x-ray showed chronic left lateral rib fractures and no acute osseous abnormality. He has no chest wall tenderness to palpation on exam. X-rays of his right forearm are unremarkable. No foreign bodies. CT scans were obtained of his head, maxillofacial and cervical spine. These were unremarkable. No facial fracture. No head bleed.  Laceration was repaired by myself and tolerated well by the patient. Patient had significant avulsion of tissue. Areas that were able to be repaired were repaired by myself. Wound care instructions provided. Patient's troponin initially is not elevated. Repeat troponin is also not elevated. Have low suspicion for ACS in this patient. His had no further chest pain since my initial evaluation. We'll have him follow-up closely with primary care. Wound care instructions provided. Return precautions provided. I advised the patient to follow-up with their primary care provider this week. I advised the patient to return to the emergency department with new or worsening symptoms or new concerns. The patient and his family verbalized understanding and agreement with plan.    This patient was discussed with and evaluated by Dr. Lita Mains who agrees with assessment and plan.   Final Clinical Impressions(s) / ED Diagnoses   Final diagnoses:  Precordial pain    Fall, initial encounter  Minor head injury, initial encounter  Laceration of right forearm, initial encounter  Acute pain of left knee  Anticoagulated on Coumadin    New Prescriptions Current Discharge Medication List    START taking these medications   Details  bacitracin ointment Apply 1 application topically 2 (two) times daily. Qty: 28 g, Refills: 1         Sharmaine Base 10/01/16 2033    Julianne Rice, MD 10/02/16 [phone]

## 2016-10-01 NOTE — ED Notes (Signed)
Suture cart set up at bedside, repeat trop drawn. Pt and family aware of delay.

## 2016-10-01 NOTE — ED Triage Notes (Signed)
Wife stated, he fell going out the back door . Hurt his face, knees, rt. Arm.  Pt. Takes blood thinners. No LOC

## 2016-10-06 ENCOUNTER — Ambulatory Visit (HOSPITAL_COMMUNITY)
Admission: EM | Admit: 2016-10-06 | Discharge: 2016-10-06 | Disposition: A | Discharge status: Home / Self Care | Payer: Medicare Other | Attending: Family Medicine | Admitting: Family Medicine

## 2016-10-06 ENCOUNTER — Ambulatory Visit (INDEPENDENT_AMBULATORY_CARE_PROVIDER_SITE_OTHER): Payer: Medicare Other

## 2016-10-06 ENCOUNTER — Encounter (HOSPITAL_COMMUNITY): Payer: Self-pay | Admitting: Emergency Medicine

## 2016-10-06 ENCOUNTER — Telehealth: Payer: Self-pay | Admitting: Sports Medicine

## 2016-10-06 DIAGNOSIS — W19XXXA Unspecified fall, initial encounter: Secondary | ICD-10-CM

## 2016-10-06 DIAGNOSIS — M791 Myalgia: Secondary | ICD-10-CM | POA: Diagnosis not present

## 2016-10-06 DIAGNOSIS — M25551 Pain in right hip: Secondary | ICD-10-CM

## 2016-10-06 DIAGNOSIS — W19XXXD Unspecified fall, subsequent encounter: Secondary | ICD-10-CM

## 2016-10-06 DIAGNOSIS — S79911A Unspecified injury of right hip, initial encounter: Secondary | ICD-10-CM | POA: Diagnosis not present

## 2016-10-06 DIAGNOSIS — M7918 Myalgia, other site: Secondary | ICD-10-CM

## 2016-10-06 DIAGNOSIS — M5489 Other dorsalgia: Secondary | ICD-10-CM

## 2016-10-06 DIAGNOSIS — M545 Low back pain: Secondary | ICD-10-CM | POA: Diagnosis not present

## 2016-10-06 MED ORDER — PREDNISONE 10 MG (21) PO TBPK: ORAL_TABLET | ORAL | 0 refills | Status: DC

## 2016-10-06 NOTE — Telephone Encounter (Signed)
Patient's wife calling with concerns over patients hip. States he fell last week, and went to the ER. Now he is in a lot of pain and hip is swollen. Asking for return call for guidance on whether to go somewhere sooner.

## 2016-10-06 NOTE — ED Provider Notes (Signed)
CSN: [id number]     Arrival date & time 10/06/16  1000 History   First MD Initiated Contact with Patient 10/06/16 1020     Chief Complaint  Patient presents with  . Fall   (Consider location/radiation/quality/duration/timing/severity/associated sxs/prior Treatment) 76 year old male patient presents to clinic for a chief complaint of right hip and back pain has been ongoing for 6 days. He fell approximately 6 days ago who was seen at the [facility] emergency department. He does take warfarin, he had a CT done of his head and neck, chest x-ray, an x-ray of his knee, however no x-ray of his hip. At the time he did not have any pain there, however his pain has gradually developed and worsened over the last 6 days. Took 2 tramadol prior to coming to the clinic this morning, and reports that that has helped his pain quite a bit. He denies any numbness or tingling in the distal extremities, however pain is worsened with standing, and worsened with ambulation.   The history is provided by the patient and the spouse.    Past Medical History:  Diagnosis Date  . Anxiety and depression   . Back pain   . Brain bleed ([place])    2003  . CVA (cerebral infarction)    2001  . Hypercholesteremia   . Hypertension   . Peripheral neuropathy   . Pulmonary embolism ([place])   . Sleep apnea    Past Surgical History:  Procedure Laterality Date  . HERNIA REPAIR    . LEG SURGERY     Broken femur  . TOTAL HIP ARTHROPLASTY     Right   Family History  Problem Relation Age of Onset  . Heart attack Father        Mother - health hx unknown   Social History  Substance Use Topics  . Smoking status: Former Smoker    Quit date: 03/24/2006  . Smokeless tobacco: Never Used  . Alcohol use 0.0 oz/week     Comment: 1 beer per day    Review of Systems  Constitutional: Negative.   HENT: Negative.   Respiratory: Negative.   Cardiovascular: Negative.   Gastrointestinal: Negative.   Musculoskeletal: Positive for  back pain.       Right hip pain  Neurological: Negative.     Allergies  Atenolol; Lotensin [benazepril hcl]; and Wellbutrin [bupropion]  Home Medications   Prior to Admission medications   Medication Sig Start Date End Date Taking? Authorizing Provider  amLODipine (NORVASC) 10 MG tablet Take 10 mg by mouth at bedtime.    [provider]  atorvastatin (LIPITOR) 40 MG tablet Take 40 mg by mouth daily.    [provider]  bacitracin ointment Apply 1 application topically 2 (two) times daily. 10/01/16   Waynetta Pean, PA-C  baclofen (LIORESAL) 10 MG tablet Take 1 tablet (10 mg total) by mouth at bedtime. 08/12/16   Gerda Diss, DO  citalopram (CELEXA) 40 MG tablet Take 40 mg by mouth at bedtime.    [provider]  fish oil-omega-3 fatty acids 1000 MG capsule Take 1 g by mouth daily.    [provider]  gabapentin (NEURONTIN) 100 MG capsule TAKE ONE CAPSULE BY MOUTH 2 TIMES A DAY AS DIRECTED 08/09/15   [provider]  losartan-hydrochlorothiazide (HYZAAR) 100-25 MG per tablet Take 1 tablet by mouth daily.    [provider]  Multiple Vitamin (MULTIVITAMIN WITH MINERALS) TABS tablet Take 1 tablet by mouth daily.  [provider]  OVER THE COUNTER MEDICATION Take 1 tablet by mouth daily. Vitamin for vision    [provider]  pantoprazole (PROTONIX) 40 MG tablet Take 40 mg by mouth daily.  02/15/16   [provider]  predniSONE (STERAPRED UNI-PAK 21 TAB) 10 MG (21) TBPK tablet Take 6 tablets tomorrow, decrease by 1 each day till finished (6,5,4,3,2,1) 10/06/16   Barnet Glasgow, NP  traMADol (ULTRAM) 50 MG tablet Take 1 tablet (50 mg total) by mouth every 6 (six) hours as needed. Patient taking differently: Take 100 mg by mouth at bedtime.  04/25/16   Gerda Diss, DO  traZODone (DESYREL) 50 MG tablet Take 50 mg by mouth at bedtime.    [provider]  vitamin B-12 (CYANOCOBALAMIN) 1000 MCG tablet  Take 1,000 mcg by mouth daily.    [provider]  warfarin (COUMADIN) 7.5 MG tablet Take 7.5 mg by mouth daily at 6 PM.     [provider]   Meds Ordered and Administered this Visit  Medications - No data to display  BP 114/64 (BP Location: Left Arm)   Pulse 64   Temp 99.3 F (37.4 C) (Oral)   Resp 18   SpO2 96%  No data found.   Physical Exam  Constitutional: He appears well-developed and well-nourished. No distress.  HENT:  Head: Normocephalic.  Right Ear: External ear normal.  Left Ear: External ear normal.  Neck: Normal range of motion.  Cardiovascular: Normal rate and regular rhythm.   Pulmonary/Chest: Effort normal and breath sounds normal.  Musculoskeletal: He exhibits tenderness. He exhibits no deformity.  Good range of motion, with pulse, motor, and sensory function intact of the lower extremities, capillary refill less than 2 seconds.  Right hip tender to palpation, expresses pain when standing. Also pain in the lower back in the right lumbar/pelvis areas. No palpable deformities felt.  Neurological: He is alert.  Skin: Skin is warm. Capillary refill takes less than 2 seconds. He is not diaphoretic.  Bandages over the right forearm, does have note of having a wound from his previous fall there.  Nursing note and vitals reviewed.   Urgent Care Course     Procedures (including critical care time)  Labs Review Labs Reviewed - No data to display  Imaging Review Dg Lumbar Spine Complete  Result Date: 10/06/2016 CLINICAL DATA:  Fall 6 days ago, low back pain EXAM: LUMBAR SPINE - COMPLETE 4+ VIEW COMPARISON:  MRI lumbar spine dated 09/03/2005 T FINDINGS: Five lumbar type vertebral bodies. Normal lumbar lordosis. No evidence of fracture or dislocation. Vertebral body heights are maintained. Mild degenerative changes the lumbar spine. Visualized bony pelvis appears intact. Vascular calcifications. IMPRESSION: No fracture or dislocation is seen. Mild  degenerative changes. Electronically Signed   By: Julian Hy M.D.   On: 10/06/2016 11:01   Dg Hip Unilat With Pelvis 2-3 Views Right  Result Date: 10/06/2016 CLINICAL DATA:  Fall 6 days ago, right hip pain EXAM: DG HIP (WITH OR WITHOUT PELVIS) 2-3V RIGHT COMPARISON:  None. FINDINGS: No fracture or dislocation is seen. Right hip arthroplasty status post revision/cerclage wire fixation. Mild degenerative changes of the left hip. Visualized bony pelvis appears intact. IMPRESSION: Status post right hip arthroplasty, without evidence of complication. Mild degenerative changes of the left hip. Electronically Signed   By: Julian Hy M.D.   On: 10/06/2016 11:06      MDM   1. Fall, subsequent encounter   2. Fall   3.  Musculoskeletal pain     No fracture or dislocations seen on films, some degenerative changes of the lumbar spine. Right hip arthroplasty seen without complication. Continue Tramadol and Baclofen as prescribed by PCP, adding short course of prednisone for inflammation, +/- OTC tylenol as needed. Follow up with PCP as needed.     Barnet Glasgow, NP 10/06/16 1136

## 2016-10-06 NOTE — ED Triage Notes (Signed)
The patient presented to the [facility] with a complaint of right hip pain secondary to a fall that occurred 6 days ago.

## 2016-10-06 NOTE — Telephone Encounter (Signed)
Spoke with [employer]. She advised that he had fallen about 5 days ago and went to the ED. His hip wasn't checked then because he wasn't having too much of an issue with it. On Wednesday he started noticing a lot of pain in the left hip. By Saturday the pain and swelling was so bad he couldn't even walk. She advised that there was no increased warmth or erythema but the hip pain just extremely painful and swollen. She said that his PCP is out of town on vacation this week. She will take him to the ED or the [facility] for evaluation of the hip today since he is on Coumadin. Spoke with Tor Netters concerning pt and she checked her INR that was done at the hospital and is in agreement. Will forward to Dr. Paulla Fore as FYI for when he returns to the office.

## 2016-10-06 NOTE — Discharge Instructions (Signed)
Continue the tramadol and baclofen that is been prescribed by your regular doctor as this is been helping her symptoms. He may also take over-the-counter Tylenol with this as needed. For inflammation, I have given a short steroid taper, take as directed. Follow-up with your regular doctor in 1-2 weeks as needed. He may also use ice 15 minutes at a time four or more times a day.

## 2016-10-06 NOTE — ED Notes (Signed)
High fall risk bracelet attached.

## 2016-10-07 DIAGNOSIS — L570 Actinic keratosis: Secondary | ICD-10-CM | POA: Diagnosis not present

## 2016-10-07 DIAGNOSIS — X32XXXD Exposure to sunlight, subsequent encounter: Secondary | ICD-10-CM | POA: Diagnosis not present

## 2016-10-07 NOTE — Telephone Encounter (Signed)
Called and left VM for pt to call the office.  

## 2016-10-07 NOTE — Telephone Encounter (Signed)
If he continues to have pain I am happy to see him.  May need to consider CT scan of the right hip given the marked changes from his prior complications from right total arthroplasty

## 2016-10-09 ENCOUNTER — Ambulatory Visit (INDEPENDENT_AMBULATORY_CARE_PROVIDER_SITE_OTHER): Payer: Medicare Other | Admitting: Sports Medicine

## 2016-10-09 ENCOUNTER — Encounter: Payer: Self-pay | Admitting: Sports Medicine

## 2016-10-09 VITALS — BP 108/64 | HR 65 | Ht 71.5 in

## 2016-10-09 DIAGNOSIS — R269 Unspecified abnormalities of gait and mobility: Secondary | ICD-10-CM | POA: Diagnosis not present

## 2016-10-09 DIAGNOSIS — Z7901 Long term (current) use of anticoagulants: Secondary | ICD-10-CM | POA: Diagnosis not present

## 2016-10-09 DIAGNOSIS — Z96641 Presence of right artificial hip joint: Secondary | ICD-10-CM

## 2016-10-09 DIAGNOSIS — S51811A Laceration without foreign body of right forearm, initial encounter: Secondary | ICD-10-CM | POA: Diagnosis not present

## 2016-10-09 DIAGNOSIS — M25551 Pain in right hip: Secondary | ICD-10-CM

## 2016-10-09 DIAGNOSIS — Z96649 Presence of unspecified artificial hip joint: Secondary | ICD-10-CM | POA: Insufficient documentation

## 2016-10-09 NOTE — Assessment & Plan Note (Signed)
Inability to safely handle it.  I did counsel him on avoiding ambulation at this time until he is able to do so safely.  If any evidence of fracture this will need to surgically addressed and otherwise can consider injection in early part of next week.

## 2016-10-09 NOTE — Assessment & Plan Note (Signed)
With periprosthetic fracture with cabling. Unable to fully exclude a secondary fracture with most recent falls and repetitive falling.  CT scan of the right hip indicated given immobility and persistent ongoing pain.  The lack of improvement can consider greater trochanteric injection but need to rule out fracture prior to this.

## 2016-10-09 NOTE — Progress Notes (Signed)
OFFICE VISIT NOTE Juanda Bond. Jory Tanguma, [place] at [facility] [phone]  Darran Gabay Rieger - 76 y.o. male MRN [id number]  Date of birth: 1940/05/21  Visit Date: 10/09/2016  PCP: Deland Pretty, MD   Referred by: Deland Pretty, MD  Elmon Kirschner, CMA  acting as scribe for Dr. Paulla Fore.  SUBJECTIVE:  No chief complaint on file.  HPI: As below and per problem based documentation when appropriate.  Patients presents today for a follow up on fall. Right lower back pain and unable to stand X 1 week.  Patient states that he has throbbing pain and pain level is 5/10.   Different positions allow him some relief.     Review of Systems  Constitutional: Negative for chills and fever.  Respiratory: Negative for shortness of breath and wheezing.   Cardiovascular: Positive for chest pain. Negative for palpitations.  Musculoskeletal: Positive for falls.  Neurological: Negative for dizziness, tingling and headaches.  Endo/Heme/Allergies: Bruises/bleeds easily.    Otherwise per HPI.  HISTORY & PERTINENT PRIOR DATA:  No specialty comments available. He reports that he quit smoking about 10 years ago. He has never used smokeless tobacco. No results for input(s): HGBA1C, LABURIC in the last 8760 hours. Medications & Allergies reviewed per EMR Patient Active Problem List   Diagnosis Date Noted  . Long term current use of anticoagulant 02/08/2016  . Right hip pain 09/12/2015  . Abnormality of gait 08/28/2015  . Head trauma 08/28/2015  . Stroke ([place]) 08/28/2015  . Neck pain 08/28/2015  . Low back pain 08/28/2015   Past Medical History:  Diagnosis Date  . Anxiety and depression   . Back pain   . Brain bleed ([place])    2003  . CVA (cerebral infarction)    2001  . Hypercholesteremia   . Hypertension   . Peripheral neuropathy   . Pulmonary embolism ([place])   . Sleep apnea    Family History  Problem Relation Age of Onset  . Heart attack Father    Mother - health hx unknown   Past Surgical History:  Procedure Laterality Date  . HERNIA REPAIR    . LEG SURGERY     Broken femur  . TOTAL HIP ARTHROPLASTY     Right   Social History   Occupational History  . Retired    Social History Main Topics  . Smoking status: Former Smoker    Quit date: 03/24/2006  . Smokeless tobacco: Never Used  . Alcohol use 0.0 oz/week     Comment: 1 beer per day  . Drug use: No  . Sexual activity: Not on file    OBJECTIVE:  VS:  HT:    WT:   BMI:     BP:   HR: bpm  TEMP: ( )  RESP:  EXAM: Findings:  WDWN, NAD, Non-toxic appearing Alert & appropriately interactive Not depressed or anxious appearing No increased work of breathing. Pupils are equal. EOM intact without nystagmus No clubbing or cyanosis of the extremities appreciated No significant rashes/lesions/ulcerations overlying the examined area. DP & PT pulses 2+/4.  Trace pretibial edema  Sensation intact to light touch in lower extremities.  Right hip and back: Marked tightness with straight leg raise but no significant pain.  He has marked TTP over the lateral hip as well as right iliac crest that this is mild.  He has almost 0 internal and external rotation of the right hip and has pain with terminal rotation.  He is able to stand with 1+ assistance but is unable to lift his right leg.  Flexion and extension of the left hip is also markedly limited.  Right upper extremity has a significant approximately 15 cm laceration across the  ulnar aspect of the forearm.  He has a white type distribution with viable tissue in the middle.  4 sutures are in place that are easily removed and the wound is intact.  X-rays reviewed as below.  He has no obvious fracture however there is marked abnormality from the periprosthetic fracture he is previously had on the right hip and there is significant bony exostosis that are difficult to fully characterize.     Dg Chest 2 View  Result Date:  10/01/2016 CLINICAL DATA:  76 y/o  M; fall with chest pain. EXAM: CHEST  2 VIEW COMPARISON:  10/26/2014 chest radiograph. FINDINGS: Stable cardiac silhouette. Aortic atherosclerosis with calcification. Clear lungs. No pleural effusion or pneumothorax. Chronic left lateral rib fractures. No acute osseous abnormality is evident. IMPRESSION: Chronic left lateral rib fractures. No acute osseous abnormality is evident. No acute pulmonary process. Electronically Signed   By: Kristine Garbe M.D.   On: 10/01/2016 17:40   Dg Lumbar Spine Complete  Result Date: 10/06/2016 CLINICAL DATA:  Fall 6 days ago, low back pain EXAM: LUMBAR SPINE - COMPLETE 4+ VIEW COMPARISON:  MRI lumbar spine dated 09/03/2005 T FINDINGS: Five lumbar type vertebral bodies. Normal lumbar lordosis. No evidence of fracture or dislocation. Vertebral body heights are maintained. Mild degenerative changes the lumbar spine. Visualized bony pelvis appears intact. Vascular calcifications. IMPRESSION: No fracture or dislocation is seen. Mild degenerative changes. Electronically Signed   By: Julian Hy M.D.   On: 10/06/2016 11:01   Dg Forearm Right  Result Date: 10/01/2016 CLINICAL DATA:  Fall, laceration, on Coumadin EXAM: RIGHT FOREARM - 2 VIEW COMPARISON:  None. FINDINGS: No fracture or dislocation is seen. The joint spaces are preserved. Visualized soft tissues are within normal limits. No radiopaque foreign body is seen. IMPRESSION: No fracture, dislocation, or radiopaque foreign body is seen. Electronically Signed   By: Julian Hy M.D.   On: 10/01/2016 17:38   Ct Head Wo Contrast  Result Date: 10/01/2016 CLINICAL DATA:  Fall today. Pain on the left side of head. No other complaints. EXAM: CT HEAD WITHOUT CONTRAST TECHNIQUE: Contiguous axial images were obtained from the base of the skull through the vertex without intravenous contrast. COMPARISON:  None. FINDINGS: Brain: No evidence of acute infarction, hemorrhage,  extra-axial collection, ventriculomegaly, or mass effect. Generalized cerebral atrophy. Periventricular white matter low attenuation likely secondary to microangiopathy. Vascular: Cerebrovascular atherosclerotic calcifications are noted. Skull: Negative for fracture or focal lesion. Sinuses/Orbits: Visualized portions of the orbits are unremarkable. Visualized portions of the paranasal sinuses and mastoid air cells are unremarkable. Other: None. IMPRESSION: 1. No acute intracranial pathology. 2. Chronic microvascular disease and cerebral atrophy. Electronically Signed   By: Kathreen Devoid   On: 10/01/2016 16:03   Ct Cervical Spine Wo Contrast  Result Date: 10/01/2016 CLINICAL DATA:  Fall with facial pain. Laceration. On anticoagulation. EXAM: CT MAXILLOFACIAL WITHOUT CONTRAST CT CERVICAL SPINE WITHOUT CONTRAST TECHNIQUE: Multidetector CT imaging of the cervical spine and maxillofacial structures were performed using the standard protocol without intravenous contrast. Multiplanar CT image reconstructions of the cervical spine and maxillofacial structures were also generated. COMPARISON:  Non contrast CT 2 hours prior. Head, cervical spine and face CT 08/07/2016 FINDINGS: CT MAXILLOFACIAL FINDINGS Osseous: No acute fracture. Zygomatic arches,  nasal bone and mandibles are intact. Deformity of the right mandibular condyle may be sequela of remote prior injury, and is unchanged from prior exam. Majority of the teeth are absent, this 3 residual lower teeth are intact. Orbits: No orbital fracture. Both orbits and globes are intact. Bilateral cataract resection. Sinuses: Clear. Soft tissues: Minimal soft tissue edema in the bifrontal region. No confluent hematoma. CT CERVICAL SPINE FINDINGS Alignment: No traumatic subluxation.  Unchanged from prior exam. Skull base and vertebrae: No acute fracture. Vertebral body heights are maintained. The dens and skull base are intact. Soft tissues and spinal canal: No prevertebral  fluid or swelling. No visible canal hematoma. Disc levels: Diffuse disc space narrowing and endplate spurring. Multilevel facet arthropathy. Degenerative changes are stable from prior exam. Upper chest: Emphysema with biapical pleuroparenchymal scarring. Other: Carotid calcifications. IMPRESSION: 1. No facial bone fracture. 2. No fracture or subluxation of the cervical spine. Electronically Signed   By: Jeb Levering M.D.   On: 10/01/2016 17:33   Dg Op Swallowing Func-medicare/speech Path  Result Date: 09/25/2016 Objective Swallowing Evaluation: Type of Study: MBS-Modified Barium Swallow Study Patient Details Name: Steven Guerrero MRN: [id number] Date of Birth: 1940-10-17 Today's Date: 09/25/2016 Time: SLP Start Time (ACUTE ONLY): 1238-SLP Stop Time (ACUTE ONLY): 1301 SLP Time Calculation (min) (ACUTE ONLY): 23 min Past Medical History: Past Medical History: Diagnosis Date . Anxiety and depression  . Back pain  . Brain bleed ([place])   2003 . CVA (cerebral infarction)   2001 . Hypercholesteremia  . Hypertension  . Peripheral neuropathy ([place])  . Pulmonary embolism ([place])  . Sleep apnea  Past Surgical History: Past Surgical History: Procedure Laterality Date . HERNIA REPAIR   . LEG SURGERY    Broken femur . TOTAL HIP ARTHROPLASTY    Right HPI: 76 yr old seen for outpatient MBS accompanied by wife with concerns for coughing with solids more than liquids. No recent pna. PMH: CVA 2001, head injury from fall 2005, HTN. No Data Recorded Assessment / Plan / Recommendation CHL IP CLINICAL IMPRESSIONS 09/25/2016 Clinical Impression Pt exhibited min-mild pharyngeal motor based dysphagia marked by slighlty decreased epiglottic inversion and laryngeal closure resulting in flash penetration with straw sips thin and minimal vallecular residue. Radiologist present and noted a small Zenkers diverticulum increasing aspiration risk post swallow. Pt/wife educated to remain upright following meals 1 hour, alternate liquids and solids, pills  whole in applesauce and small bites/sips.   SLP Visit Diagnosis Dysphagia, pharyngoesophageal phase (R13.14) Attention and concentration deficit following -- Frontal lobe and executive function deficit following -- Impact on safety and function Moderate aspiration risk   CHL IP TREATMENT RECOMMENDATION 09/25/2016 Treatment Recommendations No treatment recommended at this time   No flowsheet data found. CHL IP DIET RECOMMENDATION 09/25/2016 SLP Diet Recommendations Regular solids;Thin liquid Liquid Administration via Cup;Straw Medication Administration Whole meds with puree Compensations Slow rate;Small sips/bites;Follow solids with liquid Postural Changes Remain semi-upright after after feeds/meals (Comment);Seated upright at 90 degrees   CHL IP OTHER RECOMMENDATIONS 09/25/2016 Recommended Consults -- Oral Care Recommendations Oral care BID Other Recommendations --   CHL IP FOLLOW UP RECOMMENDATIONS 09/25/2016 Follow up Recommendations None   No flowsheet data found.     CHL IP ORAL PHASE 09/25/2016 Oral Phase WFL Oral - Pudding Teaspoon -- Oral - Pudding Cup -- Oral - Honey Teaspoon -- Oral - Honey Cup -- Oral - Nectar Teaspoon -- Oral - Nectar Cup -- Oral - Nectar Straw -- Oral - Thin Teaspoon -- Oral -  Thin Cup -- Oral - Thin Straw -- Oral - Puree -- Oral - Mech Soft -- Oral - Regular -- Oral - Multi-Consistency -- Oral - Pill -- Oral Phase - Comment --  CHL IP PHARYNGEAL PHASE 09/25/2016 Pharyngeal Phase Impaired Pharyngeal- Pudding Teaspoon -- Pharyngeal -- Pharyngeal- Pudding Cup -- Pharyngeal -- Pharyngeal- Honey Teaspoon -- Pharyngeal -- Pharyngeal- Honey Cup -- Pharyngeal -- Pharyngeal- Nectar Teaspoon -- Pharyngeal -- Pharyngeal- Nectar Cup -- Pharyngeal -- Pharyngeal- Nectar Straw -- Pharyngeal -- Pharyngeal- Thin Teaspoon -- Pharyngeal -- Pharyngeal- Thin Cup Pharyngeal residue - valleculae;Reduced epiglottic inversion Pharyngeal Material does not enter airway Pharyngeal- Thin Straw Penetration/Aspiration during  swallow;Pharyngeal residue - valleculae;Reduced epiglottic inversion Pharyngeal Material enters airway, remains ABOVE vocal cords then ejected out Pharyngeal- Puree -- Pharyngeal -- Pharyngeal- Mechanical Soft -- Pharyngeal -- Pharyngeal- Regular WFL Pharyngeal -- Pharyngeal- Multi-consistency -- Pharyngeal -- Pharyngeal- Pill -- Pharyngeal -- Pharyngeal Comment --  CHL IP CERVICAL ESOPHAGEAL PHASE 09/25/2016 Cervical Esophageal Phase WFL Pudding Teaspoon -- Pudding Cup -- Honey Teaspoon -- Honey Cup -- Nectar Teaspoon -- Nectar Cup -- Nectar Straw -- Thin Teaspoon -- Thin Cup -- Thin Straw -- Puree -- Mechanical Soft -- Regular -- Multi-consistency -- Pill -- Cervical Esophageal Comment -- CHL IP GO 09/25/2016 Functional Assessment Tool Used skilled clinical judgement Functional Limitations Swallowing Swallow Current Status (S9628) CJ Swallow Goal Status (Z6629) CJ Swallow Discharge Status (U7654) CJ Motor Speech Current Status (Y5035) (None) Motor Speech Goal Status (W6568) (None) Motor Speech Goal Status (L2751) (None) Spoken Language Comprehension Current Status (Z0017) (None) Spoken Language Comprehension Goal Status (C9449) (None) Spoken Language Comprehension Discharge Status (Q7591) (None) Spoken Language Expression Current Status (M3846) (None) Spoken Language Expression Goal Status (K5993) (None) Spoken Language Expression Discharge Status (T7017) (None) Attention Current Status (B9390) (None) Attention Goal Status (Z0092) (None) Attention Discharge Status (Z3007) (None) Memory Current Status (M2263) (None) Memory Goal Status (F3545) (None) Memory Discharge Status (G2563) (None) Voice Current Status (S9373) (None) Voice Goal Status (S2876) (None) Voice Discharge Status (O1157) (None) Other Speech-Language Pathology Functional Limitation Current Status (W6203) (None) Other Speech-Language Pathology Functional Limitation Goal Status (T5974) (None) Other Speech-Language Pathology Functional Limitation Discharge  Status [phone]) (None) [place] Siren 09/25/2016, 3:01 PM Orbie Pyo Colvin Caroli.Ed Engineer, agricultural [phone]            CLINICAL DATA:  Dysphagia EXAM: MODIFIED BARIUM SWALLOW TECHNIQUE: Different consistencies of barium were administered orally to the patient by the Speech Pathologist. Imaging of the pharynx was performed in the lateral projection. FLUOROSCOPY TIME:  Fluoroscopy Time:  2 minutes 18 seconds Radiation Exposure Index (if provided by the fluoroscopic device): 6.5 mGy Number of Acquired Spot Images: 0 COMPARISON:  None. FINDINGS: Thin liquid- Mild fluid retention in a small Zenker's diverticulum. No penetration or aspiration. Nectar thick liquid- not assessed Honey- not assessed Pure- not assessed Cracker-not assessed Pure with cracker- within normal limits Barium tablet -  within normal limits IMPRESSION: Fluid retention within a small Zenker's diverticulum. Otherwise unremarkable. Please refer to the Speech Pathologists report for complete details and recommendations. Electronically Signed   By: Rolm Baptise M.D.   On: 09/25/2016 13:03   Dg Knee Complete 4 Views Left  Result Date: 10/01/2016 CLINICAL DATA:  Left knee pain after fall today. EXAM: LEFT KNEE - COMPLETE 4+ VIEW COMPARISON:  None. FINDINGS: No evidence of fracture, dislocation, or joint effusion. No evidence of arthropathy or other focal bone abnormality. Vascular calcifications are noted. IMPRESSION: No acute abnormality seen in left  knee. Electronically Signed   By: Marijo Conception, M.D.   On: 10/01/2016 15:15   Dg Hip Unilat With Pelvis 2-3 Views Right  Result Date: 10/06/2016 CLINICAL DATA:  Fall 6 days ago, right hip pain EXAM: DG HIP (WITH OR WITHOUT PELVIS) 2-3V RIGHT COMPARISON:  None. FINDINGS: No fracture or dislocation is seen. Right hip arthroplasty status post revision/cerclage wire fixation. Mild degenerative changes of the left hip. Visualized bony pelvis appears intact. IMPRESSION: Status post right hip  arthroplasty, without evidence of complication. Mild degenerative changes of the left hip. Electronically Signed   By: Julian Hy M.D.   On: 10/06/2016 11:06   Ct Maxillofacial Wo Cm  Result Date: 10/01/2016 CLINICAL DATA:  Fall with facial pain. Laceration. On anticoagulation. EXAM: CT MAXILLOFACIAL WITHOUT CONTRAST CT CERVICAL SPINE WITHOUT CONTRAST TECHNIQUE: Multidetector CT imaging of the cervical spine and maxillofacial structures were performed using the standard protocol without intravenous contrast. Multiplanar CT image reconstructions of the cervical spine and maxillofacial structures were also generated. COMPARISON:  Non contrast CT 2 hours prior. Head, cervical spine and face CT 08/07/2016 FINDINGS: CT MAXILLOFACIAL FINDINGS Osseous: No acute fracture. Zygomatic arches, nasal bone and mandibles are intact. Deformity of the right mandibular condyle may be sequela of remote prior injury, and is unchanged from prior exam. Majority of the teeth are absent, this 3 residual lower teeth are intact. Orbits: No orbital fracture. Both orbits and globes are intact. Bilateral cataract resection. Sinuses: Clear. Soft tissues: Minimal soft tissue edema in the bifrontal region. No confluent hematoma. CT CERVICAL SPINE FINDINGS Alignment: No traumatic subluxation.  Unchanged from prior exam. Skull base and vertebrae: No acute fracture. Vertebral body heights are maintained. The dens and skull base are intact. Soft tissues and spinal canal: No prevertebral fluid or swelling. No visible canal hematoma. Disc levels: Diffuse disc space narrowing and endplate spurring. Multilevel facet arthropathy. Degenerative changes are stable from prior exam. Upper chest: Emphysema with biapical pleuroparenchymal scarring. Other: Carotid calcifications. IMPRESSION: 1. No facial bone fracture. 2. No fracture or subluxation of the cervical spine. Electronically Signed   By: Jeb Levering M.D.   On: 10/01/2016 17:33    ASSESSMENT & PLAN:   Problem List Items Addressed This Visit    Abnormality of gait - Primary    Inability to safely handle it.  I did counsel him on avoiding ambulation at this time until he is able to do so safely.  If any evidence of fracture this will need to surgically addressed and otherwise can consider injection in early part of next week.      Relevant Orders   CT HIP RIGHT WO CONTRAST   Long term current use of anticoagulant   Relevant Orders   CT HIP RIGHT WO CONTRAST   H/O total hip arthroplasty    With periprosthetic fracture with cabling. Unable to fully exclude a secondary fracture with most recent falls and repetitive falling.  CT scan of the right hip indicated given immobility and persistent ongoing pain.  The lack of improvement can consider greater trochanteric injection but need to rule out fracture prior to this.      Laceration of right forearm    Mainly superficial with some deep component.  Sutures are easily removed today without difficulty.  A sterile dressing was applied over the top of after the removal.       Other Visit Diagnoses    Pain of right hip joint       Relevant Orders  CT HIP RIGHT WO CONTRAST      Follow-up: Return for Also, we will call you about your results.  Future Appointments Date Time Provider [place]  10/27/2016 2:00 PM Gerda Diss, DO [facility] None     CMA/ATC served as scribe during this visit. History, Physical, and Plan performed by medical provider. Documentation and orders reviewed and attested to.      Teresa Coombs, [place] [facility] Physician

## 2016-10-09 NOTE — Assessment & Plan Note (Signed)
Mainly superficial with some deep component.  Sutures are easily removed today without difficulty.  A sterile dressing was applied over the top of after the removal.

## 2016-10-09 NOTE — Telephone Encounter (Signed)
Pt on scheduled to see Dr Paulla Fore today.

## 2016-10-10 ENCOUNTER — Ambulatory Visit (HOSPITAL_COMMUNITY)
Admission: RE | Admit: 2016-10-10 | Discharge: 2016-10-10 | Disposition: A | Discharge status: Home / Self Care | Payer: Medicare Other | Source: Ambulatory Visit | Attending: Sports Medicine | Admitting: Sports Medicine

## 2016-10-10 DIAGNOSIS — R296 Repeated falls: Secondary | ICD-10-CM | POA: Diagnosis not present

## 2016-10-10 DIAGNOSIS — Z7901 Long term (current) use of anticoagulants: Secondary | ICD-10-CM | POA: Insufficient documentation

## 2016-10-10 DIAGNOSIS — M24651 Ankylosis, right hip: Secondary | ICD-10-CM | POA: Diagnosis not present

## 2016-10-10 DIAGNOSIS — R269 Unspecified abnormalities of gait and mobility: Secondary | ICD-10-CM | POA: Insufficient documentation

## 2016-10-10 DIAGNOSIS — M25551 Pain in right hip: Secondary | ICD-10-CM

## 2016-10-13 DIAGNOSIS — Z7901 Long term (current) use of anticoagulants: Secondary | ICD-10-CM | POA: Diagnosis not present

## 2016-10-13 DIAGNOSIS — Z86718 Personal history of other venous thrombosis and embolism: Secondary | ICD-10-CM | POA: Diagnosis not present

## 2016-10-17 ENCOUNTER — Ambulatory Visit (INDEPENDENT_AMBULATORY_CARE_PROVIDER_SITE_OTHER): Payer: Medicare Other | Admitting: Sports Medicine

## 2016-10-17 ENCOUNTER — Ambulatory Visit: Payer: Self-pay

## 2016-10-17 ENCOUNTER — Encounter: Payer: Self-pay | Admitting: Sports Medicine

## 2016-10-17 VITALS — BP 110/62 | HR 66 | Ht 71.5 in

## 2016-10-17 DIAGNOSIS — Z96641 Presence of right artificial hip joint: Secondary | ICD-10-CM | POA: Diagnosis not present

## 2016-10-17 DIAGNOSIS — M25551 Pain in right hip: Secondary | ICD-10-CM | POA: Diagnosis not present

## 2016-10-17 DIAGNOSIS — R269 Unspecified abnormalities of gait and mobility: Secondary | ICD-10-CM | POA: Diagnosis not present

## 2016-10-17 DIAGNOSIS — I6300 Cerebral infarction due to thrombosis of unspecified precerebral artery: Secondary | ICD-10-CM | POA: Diagnosis not present

## 2016-10-17 NOTE — Patient Instructions (Signed)

## 2016-10-17 NOTE — Progress Notes (Signed)
OFFICE VISIT NOTE Steven Guerrero. [place], [place] at [facility] [phone]  Aariz Maish Fedak - 76 y.o. male MRN [id number]  Date of birth: [birth year]  Visit Date: 10/17/2016  PCP: Deland Pretty, MD   Referred by: Deland Pretty, MD  Burlene Arnt, CMA acting as scribe for Dr. Paulla Fore.  SUBJECTIVE:   Chief Complaint  Patient presents with  . Follow-up    right hip pain   HPI: As below and per problem based documentation when appropriate.  Pt presents today in follow-up of Right posterior hip pain and abnormal gait. Multiple recent falls. History of right hip arthroplasty with periprosthetic fractures. He would like steroid injection today. CT of the right hip was done 10/10/2016 and showed the following:  FINDINGS: Bones/Joint/Cartilage Examination is limited to the right hip and right hemipelvis. Patient is status post right total hip arthroplasty with a long stem femoral prosthesis which is imaged in its entirety. There are multiple cerclage wires around the greater trochanter and proximal diaphysis. No evidence of hardware displacement or loosening. There is posttraumatic deformity of the proximal femur with irregular cortical thickening and heterotopic ossification. No evidence of acute fracture or dislocation. Ligaments Not relevant for exam/indication. Muscles and Tendons Mild atrophy of the right gluteus and quadriceps musculature. Soft tissues Iliofemoral atherosclerosis noted. There is no evidence of hematoma or other focal periarticular fluid collection. IMPRESSION: 1. No evidence of acute fracture or dislocation. 2. No evidence of hardware failure status post right total hip arthroplasty and proximal right femoral cerclage wire fixation.  Pt reports no change in hip pain. Wife, [name] states that he still cannot stand up without assistance. He is unable to bear weight on the right side.     Review of Systems    Constitutional: Negative for chills, fever and weight loss.  Respiratory: Negative for shortness of breath and wheezing.   Cardiovascular: Positive for leg swelling (left foot). Negative for chest pain and palpitations.  Musculoskeletal: Positive for falls and joint pain.  Neurological: Negative for dizziness, tingling and headaches.  Endo/Heme/Allergies: Bruises/bleeds easily.    Otherwise per HPI.  HISTORY & PERTINENT PRIOR DATA:  No specialty comments available. He reports that he quit smoking about 10 years ago. He has never used smokeless tobacco. No results for input(s): HGBA1C, LABURIC in the last 8760 hours. Medications & Allergies reviewed per EMR Patient Active Problem List   Diagnosis Date Noted  . H/O total hip arthroplasty 10/09/2016  . Laceration of right forearm 10/09/2016  . Long term current use of anticoagulant 02/08/2016  . Right hip pain 09/12/2015  . Abnormality of gait 08/28/2015  . Head trauma 08/28/2015  . Stroke ([place]) 08/28/2015  . Neck pain 08/28/2015  . Low back pain 08/28/2015   Past Medical History:  Diagnosis Date  . Anxiety and depression   . Back pain   . Brain bleed ([place])    2003  . CVA (cerebral infarction)    2001  . Hypercholesteremia   . Hypertension   . Peripheral neuropathy   . Pulmonary embolism ([place])   . Sleep apnea    Family History  Problem Relation Age of Onset  . Heart attack Father        Mother - health hx unknown   Past Surgical History:  Procedure Laterality Date  . HERNIA REPAIR    . LEG SURGERY     Broken femur  . TOTAL HIP ARTHROPLASTY     Right  Social History   Occupational History  . Retired    Social History Main Topics  . Smoking status: Former Smoker    Quit date: 03/24/2006  . Smokeless tobacco: Never Used  . Alcohol use 0.0 oz/week     Comment: 1 beer per day  . Drug use: No  . Sexual activity: Not on file    OBJECTIVE:  VS:  HT:5' 11.5" (181.6 cm)   WT: (unable)  BMI:     BP:110/62   HR:66bpm  TEMP: ( )  RESP:94 % EXAM: Findings:  Elderly male, right hemiparesis that is stable.  He is difficult time going from sit to stand and requires moderate assistance. There is a focal TTP of the greater trochanter as well as the gluteal musculature.  He has overall fairly good internal and external rotation of the leg.  Lower extremity strength is diminished on the right compared to left this is consistent with his baseline.  No significant pretibial edema or significant bruising in the lower extremities.     US Guided Needle Placement(no Linked Charges)  Result Date: 10/17/2016 Gerda Diss, DO     11/15/2016  1:42 AM PROCEDURE NOTE -  ULTRASOUND GUIDEDINJECTION: Right Greater Trochanteric Injection Images were obtained and interpreted by myself, Teresa Coombs, DO Images have been saved and stored to PACS system. Images obtained on: GE S7 Ultrasound machine  ULTRASOUND FINDINGS: There is a small amount of bursal swelling as well as marked thickening of the hip abductor's with increased neovascularity. DESCRIPTION OF PROCEDURE: The patient's clinical condition is marked by substantial pain and/or significant functional disability. Other conservative therapy has not provided relief, is contraindicated, or not appropriate. There is a reasonable likelihood that injection will significantly improve the patient's pain and/or functional impairment. After discussing the risks, benefits and expected outcomes of the injection and all questions were reviewed and answered, the patient wished to undergo the above named procedure. Verbal consent was obtained. The ultrasound was used to identify the target structure and adjacent neurovascular structures. The skin was then prepped in sterile fashion and the target structure was injected under direct visualization using sterile technique as below: PREP: Alcohol, Ethel Chloride APPROACH: direct inplane, single injection, 25g 1.5" needle INJECTATE: 2cc 1%  lidocaine, 2cc 40mg  DepoMedrol ASPIRATE: N/A DRESSING: Band-Aid Post procedural instructions including recommending icing and warning signs for infection were reviewed. This procedure was well tolerated and there were no complications.  IMPRESSION: Succesful US Guided Injection    ASSESSMENT & PLAN:     ICD-10-CM   1. Right hip pain M25.551 US GUIDED NEEDLE PLACEMENT(NO LINKED CHARGES)    Ambulatory referral to Home Health  2. Abnormality of gait R26.9 Ambulatory referral to Home Health  3. History of total right hip replacement Z96.641    With subsequent peri-prosthetic fracture and cabling.  Chronic tendinopathic changes  4. Cerebrovascular accident (CVA) due to thrombosis of precerebral artery (HCC) I63.00   =================================================================  His difficulty walking  Seems to be originating from the right hip.  CT scan was reassuring for no acute fracture from his prior fall.  Ultrasound-guided greater trochanteric injection performed today as he has responded well to this.  I like for him to resume the nitroglycerin protocol he was previously on the provided significant benefit was discontinued due to doing so well in 2 weeks.  Home health physical therapy given his inability to ambulate safely we will relieve home.  We will plan to follow-up with him in 6 weeks and see  how he was progressing. ================================================================= ================================================================= Future Appointments Date Time Provider [place]  11/28/2016 10:20 AM Gerda Diss, DO [facility] None    Follow-up: Return in about 6 weeks (around 11/28/2016).   CMA/ATC served as Education administrator during this visit. History, Physical, and Plan performed by medical provider. Documentation and orders reviewed and attested to.      Teresa Coombs, [facility] Physician

## 2016-10-18 ENCOUNTER — Other Ambulatory Visit: Payer: Self-pay | Admitting: Sports Medicine

## 2016-10-18 DIAGNOSIS — M5441 Lumbago with sciatica, right side: Principal | ICD-10-CM

## 2016-10-18 DIAGNOSIS — G8929 Other chronic pain: Secondary | ICD-10-CM

## 2016-10-18 DIAGNOSIS — M5442 Lumbago with sciatica, left side: Principal | ICD-10-CM

## 2016-10-20 DIAGNOSIS — G4733 Obstructive sleep apnea (adult) (pediatric): Secondary | ICD-10-CM | POA: Diagnosis not present

## 2016-10-20 DIAGNOSIS — Z7901 Long term (current) use of anticoagulants: Secondary | ICD-10-CM | POA: Diagnosis not present

## 2016-10-20 DIAGNOSIS — Z87820 Personal history of traumatic brain injury: Secondary | ICD-10-CM | POA: Diagnosis not present

## 2016-10-20 DIAGNOSIS — Z8673 Personal history of transient ischemic attack (TIA), and cerebral infarction without residual deficits: Secondary | ICD-10-CM | POA: Diagnosis not present

## 2016-10-20 DIAGNOSIS — G629 Polyneuropathy, unspecified: Secondary | ICD-10-CM | POA: Diagnosis not present

## 2016-10-20 DIAGNOSIS — M545 Low back pain: Secondary | ICD-10-CM | POA: Diagnosis not present

## 2016-10-20 DIAGNOSIS — Z86711 Personal history of pulmonary embolism: Secondary | ICD-10-CM | POA: Diagnosis not present

## 2016-10-20 DIAGNOSIS — M542 Cervicalgia: Secondary | ICD-10-CM | POA: Diagnosis not present

## 2016-10-20 DIAGNOSIS — Z96641 Presence of right artificial hip joint: Secondary | ICD-10-CM | POA: Diagnosis not present

## 2016-10-20 DIAGNOSIS — R269 Unspecified abnormalities of gait and mobility: Secondary | ICD-10-CM | POA: Diagnosis not present

## 2016-10-20 DIAGNOSIS — I10 Essential (primary) hypertension: Secondary | ICD-10-CM | POA: Diagnosis not present

## 2016-10-22 DIAGNOSIS — I10 Essential (primary) hypertension: Secondary | ICD-10-CM | POA: Diagnosis not present

## 2016-10-22 DIAGNOSIS — G4733 Obstructive sleep apnea (adult) (pediatric): Secondary | ICD-10-CM | POA: Diagnosis not present

## 2016-10-22 DIAGNOSIS — R269 Unspecified abnormalities of gait and mobility: Secondary | ICD-10-CM | POA: Diagnosis not present

## 2016-10-22 DIAGNOSIS — Z8673 Personal history of transient ischemic attack (TIA), and cerebral infarction without residual deficits: Secondary | ICD-10-CM | POA: Diagnosis not present

## 2016-10-22 DIAGNOSIS — Z86711 Personal history of pulmonary embolism: Secondary | ICD-10-CM | POA: Diagnosis not present

## 2016-10-22 DIAGNOSIS — G629 Polyneuropathy, unspecified: Secondary | ICD-10-CM | POA: Diagnosis not present

## 2016-10-22 DIAGNOSIS — M545 Low back pain: Secondary | ICD-10-CM | POA: Diagnosis not present

## 2016-10-22 DIAGNOSIS — M542 Cervicalgia: Secondary | ICD-10-CM | POA: Diagnosis not present

## 2016-10-22 DIAGNOSIS — Z7901 Long term (current) use of anticoagulants: Secondary | ICD-10-CM | POA: Diagnosis not present

## 2016-10-22 DIAGNOSIS — Z87820 Personal history of traumatic brain injury: Secondary | ICD-10-CM | POA: Diagnosis not present

## 2016-10-22 DIAGNOSIS — Z96641 Presence of right artificial hip joint: Secondary | ICD-10-CM | POA: Diagnosis not present

## 2016-10-24 ENCOUNTER — Ambulatory Visit: Payer: Medicare Other | Admitting: Sports Medicine

## 2016-10-24 ENCOUNTER — Other Ambulatory Visit: Payer: Self-pay

## 2016-10-24 ENCOUNTER — Other Ambulatory Visit: Payer: Self-pay | Admitting: Sports Medicine

## 2016-10-24 DIAGNOSIS — Z7901 Long term (current) use of anticoagulants: Secondary | ICD-10-CM | POA: Diagnosis not present

## 2016-10-24 DIAGNOSIS — M5441 Lumbago with sciatica, right side: Principal | ICD-10-CM

## 2016-10-24 DIAGNOSIS — R269 Unspecified abnormalities of gait and mobility: Secondary | ICD-10-CM | POA: Diagnosis not present

## 2016-10-24 DIAGNOSIS — Z8673 Personal history of transient ischemic attack (TIA), and cerebral infarction without residual deficits: Secondary | ICD-10-CM | POA: Diagnosis not present

## 2016-10-24 DIAGNOSIS — G8929 Other chronic pain: Secondary | ICD-10-CM

## 2016-10-24 DIAGNOSIS — I10 Essential (primary) hypertension: Secondary | ICD-10-CM | POA: Diagnosis not present

## 2016-10-24 DIAGNOSIS — Z87820 Personal history of traumatic brain injury: Secondary | ICD-10-CM | POA: Diagnosis not present

## 2016-10-24 DIAGNOSIS — G629 Polyneuropathy, unspecified: Secondary | ICD-10-CM | POA: Diagnosis not present

## 2016-10-24 DIAGNOSIS — Z86711 Personal history of pulmonary embolism: Secondary | ICD-10-CM | POA: Diagnosis not present

## 2016-10-24 DIAGNOSIS — M542 Cervicalgia: Secondary | ICD-10-CM | POA: Diagnosis not present

## 2016-10-24 DIAGNOSIS — G4733 Obstructive sleep apnea (adult) (pediatric): Secondary | ICD-10-CM | POA: Diagnosis not present

## 2016-10-24 DIAGNOSIS — Z96641 Presence of right artificial hip joint: Secondary | ICD-10-CM | POA: Diagnosis not present

## 2016-10-24 DIAGNOSIS — M5442 Lumbago with sciatica, left side: Principal | ICD-10-CM

## 2016-10-24 DIAGNOSIS — M545 Low back pain: Secondary | ICD-10-CM | POA: Diagnosis not present

## 2016-10-24 MED ORDER — TRAMADOL HCL 50 MG PO TABS: 50.0000 mg | ORAL_TABLET | Freq: Four times a day (QID) | ORAL | 0 refills | Status: DC | PRN

## 2016-10-24 NOTE — Telephone Encounter (Signed)
Per OV note 04/25/16 "I'm happy to refill his tramadol and baclofen as needed." Rx called into pharmacy.

## 2016-10-27 ENCOUNTER — Ambulatory Visit: Payer: Medicare Other | Admitting: Sports Medicine

## 2016-10-28 DIAGNOSIS — R269 Unspecified abnormalities of gait and mobility: Secondary | ICD-10-CM | POA: Diagnosis not present

## 2016-10-28 DIAGNOSIS — G629 Polyneuropathy, unspecified: Secondary | ICD-10-CM | POA: Diagnosis not present

## 2016-10-28 DIAGNOSIS — Z8673 Personal history of transient ischemic attack (TIA), and cerebral infarction without residual deficits: Secondary | ICD-10-CM | POA: Diagnosis not present

## 2016-10-28 DIAGNOSIS — G4733 Obstructive sleep apnea (adult) (pediatric): Secondary | ICD-10-CM | POA: Diagnosis not present

## 2016-10-28 DIAGNOSIS — Z87820 Personal history of traumatic brain injury: Secondary | ICD-10-CM | POA: Diagnosis not present

## 2016-10-28 DIAGNOSIS — I10 Essential (primary) hypertension: Secondary | ICD-10-CM | POA: Diagnosis not present

## 2016-10-28 DIAGNOSIS — Z96641 Presence of right artificial hip joint: Secondary | ICD-10-CM | POA: Diagnosis not present

## 2016-10-28 DIAGNOSIS — Z86711 Personal history of pulmonary embolism: Secondary | ICD-10-CM | POA: Diagnosis not present

## 2016-10-28 DIAGNOSIS — M545 Low back pain: Secondary | ICD-10-CM | POA: Diagnosis not present

## 2016-10-28 DIAGNOSIS — M542 Cervicalgia: Secondary | ICD-10-CM | POA: Diagnosis not present

## 2016-10-28 DIAGNOSIS — Z7901 Long term (current) use of anticoagulants: Secondary | ICD-10-CM | POA: Diagnosis not present

## 2016-10-31 DIAGNOSIS — Z87820 Personal history of traumatic brain injury: Secondary | ICD-10-CM | POA: Diagnosis not present

## 2016-10-31 DIAGNOSIS — Z86711 Personal history of pulmonary embolism: Secondary | ICD-10-CM | POA: Diagnosis not present

## 2016-10-31 DIAGNOSIS — M545 Low back pain: Secondary | ICD-10-CM | POA: Diagnosis not present

## 2016-10-31 DIAGNOSIS — G4733 Obstructive sleep apnea (adult) (pediatric): Secondary | ICD-10-CM | POA: Diagnosis not present

## 2016-10-31 DIAGNOSIS — Z96641 Presence of right artificial hip joint: Secondary | ICD-10-CM | POA: Diagnosis not present

## 2016-10-31 DIAGNOSIS — I10 Essential (primary) hypertension: Secondary | ICD-10-CM | POA: Diagnosis not present

## 2016-10-31 DIAGNOSIS — M542 Cervicalgia: Secondary | ICD-10-CM | POA: Diagnosis not present

## 2016-10-31 DIAGNOSIS — Z7901 Long term (current) use of anticoagulants: Secondary | ICD-10-CM | POA: Diagnosis not present

## 2016-10-31 DIAGNOSIS — R269 Unspecified abnormalities of gait and mobility: Secondary | ICD-10-CM | POA: Diagnosis not present

## 2016-10-31 DIAGNOSIS — G629 Polyneuropathy, unspecified: Secondary | ICD-10-CM | POA: Diagnosis not present

## 2016-10-31 DIAGNOSIS — Z8673 Personal history of transient ischemic attack (TIA), and cerebral infarction without residual deficits: Secondary | ICD-10-CM | POA: Diagnosis not present

## 2016-11-05 DIAGNOSIS — Z7901 Long term (current) use of anticoagulants: Secondary | ICD-10-CM | POA: Diagnosis not present

## 2016-11-05 DIAGNOSIS — G4733 Obstructive sleep apnea (adult) (pediatric): Secondary | ICD-10-CM | POA: Diagnosis not present

## 2016-11-05 DIAGNOSIS — Z86711 Personal history of pulmonary embolism: Secondary | ICD-10-CM | POA: Diagnosis not present

## 2016-11-05 DIAGNOSIS — Z8673 Personal history of transient ischemic attack (TIA), and cerebral infarction without residual deficits: Secondary | ICD-10-CM | POA: Diagnosis not present

## 2016-11-05 DIAGNOSIS — I10 Essential (primary) hypertension: Secondary | ICD-10-CM | POA: Diagnosis not present

## 2016-11-05 DIAGNOSIS — Z87820 Personal history of traumatic brain injury: Secondary | ICD-10-CM | POA: Diagnosis not present

## 2016-11-05 DIAGNOSIS — Z96641 Presence of right artificial hip joint: Secondary | ICD-10-CM | POA: Diagnosis not present

## 2016-11-05 DIAGNOSIS — R269 Unspecified abnormalities of gait and mobility: Secondary | ICD-10-CM | POA: Diagnosis not present

## 2016-11-05 DIAGNOSIS — M545 Low back pain: Secondary | ICD-10-CM | POA: Diagnosis not present

## 2016-11-05 DIAGNOSIS — G629 Polyneuropathy, unspecified: Secondary | ICD-10-CM | POA: Diagnosis not present

## 2016-11-05 DIAGNOSIS — M542 Cervicalgia: Secondary | ICD-10-CM | POA: Diagnosis not present

## 2016-11-06 DIAGNOSIS — Z7901 Long term (current) use of anticoagulants: Secondary | ICD-10-CM | POA: Diagnosis not present

## 2016-11-06 DIAGNOSIS — Z86718 Personal history of other venous thrombosis and embolism: Secondary | ICD-10-CM | POA: Diagnosis not present

## 2016-11-15 NOTE — Assessment & Plan Note (Signed)
His difficulty walking  Seems to be originating from the right hip.  CT scan was reassuring for no acute fracture from his prior fall.  Ultrasound-guided greater trochanteric injection performed today as he has responded well to this.  I like for him to resume the nitroglycerin protocol he was previously on the provided significant benefit was discontinued due to doing so well in 2 weeks.  Home health physical therapy given his inability to ambulate safely we will relieve home.  We will plan to follow-up with him in 6 weeks and see how he was progressing.

## 2016-11-15 NOTE — Procedures (Signed)
PROCEDURE NOTE -  ULTRASOUND GUIDEDINJECTION: Right Greater Trochanteric Injection Images were obtained and interpreted by myself, Teresa Coombs, DO  Images have been saved and stored to PACS system. Images obtained on: GE S7 Ultrasound machine  ULTRASOUND FINDINGS: There is a small amount of bursal swelling as well as marked thickening of the hip abductor's with increased neovascularity.  DESCRIPTION OF PROCEDURE:  The patient's clinical condition is marked by substantial pain and/or significant functional disability. Other conservative therapy has not provided relief, is contraindicated, or not appropriate. There is a reasonable likelihood that injection will significantly improve the patient's pain and/or functional impairment. After discussing the risks, benefits and expected outcomes of the injection and all questions were reviewed and answered, the patient wished to undergo the above named procedure. Verbal consent was obtained. The ultrasound was used to identify the target structure and adjacent neurovascular structures. The skin was then prepped in sterile fashion and the target structure was injected under direct visualization using sterile technique as below: PREP: Alcohol, Ethel Chloride APPROACH: direct inplane, single injection, 25g 1.5" needle INJECTATE: 2cc 1% lidocaine, 2cc 40mg  DepoMedrol ASPIRATE: N/A DRESSING: Band-Aid   Post procedural instructions including recommending icing and warning signs for infection were reviewed. This procedure was well tolerated and there were no complications.   IMPRESSION: Succesful US Guided Injection

## 2016-11-28 ENCOUNTER — Encounter: Payer: Self-pay | Admitting: Sports Medicine

## 2016-11-28 ENCOUNTER — Ambulatory Visit (INDEPENDENT_AMBULATORY_CARE_PROVIDER_SITE_OTHER): Payer: Medicare Other | Admitting: Sports Medicine

## 2016-11-28 ENCOUNTER — Ambulatory Visit: Payer: Medicare Other | Admitting: Sports Medicine

## 2016-11-28 VITALS — BP 138/72 | HR 58 | Ht 71.5 in | Wt 191.4 lb

## 2016-11-28 DIAGNOSIS — Z96641 Presence of right artificial hip joint: Secondary | ICD-10-CM | POA: Diagnosis not present

## 2016-11-28 DIAGNOSIS — G8929 Other chronic pain: Secondary | ICD-10-CM | POA: Diagnosis not present

## 2016-11-28 DIAGNOSIS — M5441 Lumbago with sciatica, right side: Secondary | ICD-10-CM

## 2016-11-28 DIAGNOSIS — M25551 Pain in right hip: Secondary | ICD-10-CM | POA: Diagnosis not present

## 2016-11-28 DIAGNOSIS — M5442 Lumbago with sciatica, left side: Secondary | ICD-10-CM

## 2016-11-28 DIAGNOSIS — Z7901 Long term (current) use of anticoagulants: Secondary | ICD-10-CM

## 2016-11-28 NOTE — Progress Notes (Addendum)
OFFICE VISIT NOTE Steven Guerrero at [facility] [phone]  Steven Guerrero - 76 y.o. male MRN [id number]  Date of birth: 17-Sep-1940  Visit Date: 11/28/2016  PCP: Deland Pretty, MD   Referred by: Deland Pretty, MD  Burlene Arnt, CMA acting as scribe for Dr. Paulla Fore.  SUBJECTIVE:   Chief Complaint  Patient presents with  . Follow-up    RT hip pain   HPI: As below and per problem based documentation when appropriate.  [name] is an established patient presenting today in follow-up of LT hip pain. He was last seen 10/17/2016 and given steroid injection. He was advised to follow Nitro Protocol. Order was provided for [facility] PT.   Pt reports improvement of hip pain with steroid injection and nitroglycerin patches. He did PT at home for 3 weeks and then was released. He was given exercises to continue doing but he has not been doing them. Pt. Reports continued pain in the hip but not as bad as it used to. It is easier for him to ambulate. He does use a walker to ambulate. He has fallen twice since last Sunday, he was not using his walker when these falls happened.     Review of Systems  Constitutional: Negative for chills and fever.  Respiratory: Negative for shortness of breath and wheezing.   Cardiovascular: Negative for chest pain and palpitations.  Gastrointestinal: Positive for diarrhea (x 1 week).  Musculoskeletal: Positive for falls.  Neurological: Negative for dizziness, tingling and headaches.  Endo/Heme/Allergies: Bruises/bleeds easily.    Otherwise per HPI.  HISTORY & PERTINENT PRIOR DATA:  No specialty comments available. He reports that he quit smoking about 10 years ago. He has never used smokeless tobacco. No results for input(s): HGBA1C, LABURIC in the last 8760 hours. Medications & Allergies reviewed per EMR Patient Active Problem List   Diagnosis Date Noted  . H/O total hip arthroplasty 10/09/2016  . Laceration  of right forearm 10/09/2016  . Long term current use of anticoagulant 02/08/2016  . Right hip pain 09/12/2015  . Abnormality of gait 08/28/2015  . Head trauma 08/28/2015  . Stroke ([place]) 08/28/2015  . Neck pain 08/28/2015  . Low back pain 08/28/2015   Past Medical History:  Diagnosis Date  . Anxiety and depression   . Back pain   . Brain bleed ([place])    2003  . CVA (cerebral infarction)    2001  . Hypercholesteremia   . Hypertension   . Peripheral neuropathy   . Pulmonary embolism ([place])   . Sleep apnea    Family History  Problem Relation Age of Onset  . Heart attack Father        Mother - health hx unknown   Past Surgical History:  Procedure Laterality Date  . HERNIA REPAIR    . LEG SURGERY     Broken femur  . TOTAL HIP ARTHROPLASTY     Right   Social History   Occupational History  . Retired    Social History Main Topics  . Smoking status: Former Smoker    Quit date: 03/24/2006  . Smokeless tobacco: Never Used  . Alcohol use 0.0 oz/week     Comment: 1 beer per day  . Drug use: No  . Sexual activity: Not on file    OBJECTIVE:  VS:  HT:5' 11.5" (181.6 cm)   WT:191 lb 6.4 oz (86.8 kg)  BMI:26.33    BP:138/72  HR:(!)  58bpm  TEMP: ( )  RESP:93 % EXAM: Findings:  Elderly dysarthric male in no acute distress.  He walks with a significant antalgic gait and is in a wheelchair today.  Sit to stand test is improved compared to prior visit but he had a significant favoring of the right hip.  He has no focal tenderness over this area today.  He has good internal and external rotation of his right hip.  No significant pain with straight leg raise.     No results found. ASSESSMENT & PLAN:     ICD-10-CM   1. Right hip pain M25.551 nitroGLYCERIN (NITRODUR - DOSED IN MG/24 HR) 0.2 mg/hr patch  2. Chronic low back pain with bilateral sciatica, unspecified back pain laterality M54.41    G89.29    M54.42   3. History of total right hip replacement Z96.641   4. Long  term current use of anticoagulant Z79.01    ================================================================= Right hip pain He has had a long-standing history with postsurgical pain and Tendinopathic changes of the glute medius.  He responded well previously to nitroglycerin protocol without AcipHex and given changes previously seen on last ultrasound that his symptoms have improved he would benefit likely long-term from continuing with this medicine chronically.  He has prescription available at home but this has been added back to his medication list and he should continue this for the past of nitroglycerin over the lateral hip indefinitely. He does walk with a slight antalgic gait and will continue to benefit from continued strengthening exercises I have once again been emphasized with home health PT that is now been discontinued.  He will plan to follow-up with me as needed and at minimum in 6 months  Follow-up: Return if symptoms worsen or fail to improve.   CMA/ATC served as Education administrator during this visit. History, Physical, and Plan performed by medical provider. Documentation and orders reviewed and attested to.      Teresa Coombs, [place] Physician

## 2016-12-02 DIAGNOSIS — Z86718 Personal history of other venous thrombosis and embolism: Secondary | ICD-10-CM | POA: Diagnosis not present

## 2016-12-02 DIAGNOSIS — Z7901 Long term (current) use of anticoagulants: Secondary | ICD-10-CM | POA: Diagnosis not present

## 2016-12-03 DIAGNOSIS — H6123 Impacted cerumen, bilateral: Secondary | ICD-10-CM | POA: Diagnosis not present

## 2016-12-09 DIAGNOSIS — M542 Cervicalgia: Secondary | ICD-10-CM | POA: Diagnosis not present

## 2016-12-09 DIAGNOSIS — I10 Essential (primary) hypertension: Secondary | ICD-10-CM | POA: Diagnosis not present

## 2016-12-09 DIAGNOSIS — R269 Unspecified abnormalities of gait and mobility: Secondary | ICD-10-CM | POA: Diagnosis not present

## 2016-12-21 MED ORDER — NITROGLYCERIN 0.2 MG/HR TD PT24: MEDICATED_PATCH | TRANSDERMAL | 1 refills | Status: DC

## 2016-12-21 NOTE — Assessment & Plan Note (Addendum)
He has had a long-standing history with postsurgical pain and Tendinopathic changes of the glute medius.  He responded well previously to nitroglycerin protocol without AcipHex and given changes previously seen on last ultrasound that his symptoms have improved he would benefit likely long-term from continuing with this medicine chronically.  He has prescription available at home but this has been added back to his medication list and he should continue this for the past of nitroglycerin over the lateral hip indefinitely. He does walk with a slight antalgic gait and will continue to benefit from continued strengthening exercises I have once again been emphasized with home health PT that is now been discontinued.  He will plan to follow-up with me as needed and at minimum in 6 months

## 2016-12-30 DIAGNOSIS — Z86718 Personal history of other venous thrombosis and embolism: Secondary | ICD-10-CM | POA: Diagnosis not present

## 2016-12-30 DIAGNOSIS — Z7901 Long term (current) use of anticoagulants: Secondary | ICD-10-CM | POA: Diagnosis not present

## 2016-12-30 DIAGNOSIS — Z23 Encounter for immunization: Secondary | ICD-10-CM | POA: Diagnosis not present

## 2017-01-01 ENCOUNTER — Other Ambulatory Visit: Payer: Self-pay | Admitting: Sports Medicine

## 2017-01-01 DIAGNOSIS — M25551 Pain in right hip: Secondary | ICD-10-CM

## 2017-01-15 DIAGNOSIS — Z7901 Long term (current) use of anticoagulants: Secondary | ICD-10-CM | POA: Diagnosis not present

## 2017-01-15 DIAGNOSIS — Z86718 Personal history of other venous thrombosis and embolism: Secondary | ICD-10-CM | POA: Diagnosis not present

## 2017-02-11 DIAGNOSIS — Z7901 Long term (current) use of anticoagulants: Secondary | ICD-10-CM | POA: Diagnosis not present

## 2017-02-11 DIAGNOSIS — Z86718 Personal history of other venous thrombosis and embolism: Secondary | ICD-10-CM | POA: Diagnosis not present

## 2017-03-11 DIAGNOSIS — Z86718 Personal history of other venous thrombosis and embolism: Secondary | ICD-10-CM | POA: Diagnosis not present

## 2017-03-11 DIAGNOSIS — Z7901 Long term (current) use of anticoagulants: Secondary | ICD-10-CM | POA: Diagnosis not present

## 2017-03-13 IMAGING — MR MR HIP*R* W/O CM
4 of 6 series · 17 of 40 positions shown · non-contrast
Comparison: None.

CLINICAL DATA: Chronic right hip pain.

EXAM:
MR OF THE RIGHT HIP WITHOUT CONTRAST
TECHNIQUE: Multiplanar, multisequence MR imaging was performed. No intravenous
contrast was administered.

[Series 3: STIR · coronal · 4.0mm · 1.19mm/px · 3 of 19 slices shown]
[im 4/19]
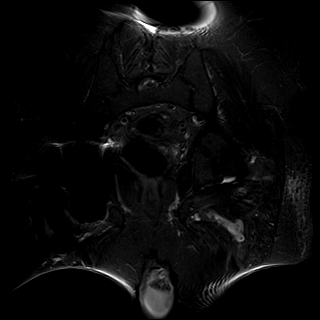
[im 11/19]
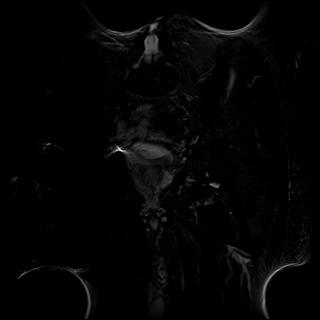
[im 19/19]
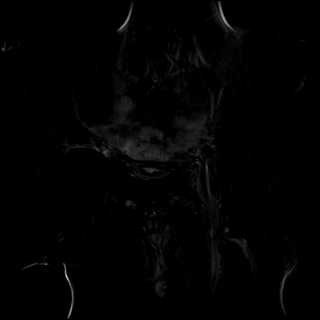

[Series 4: T1 · coronal · 4.0mm · 0.59mm/px · 6 of 19 slices shown (1 of 2)]
[im 1/19]
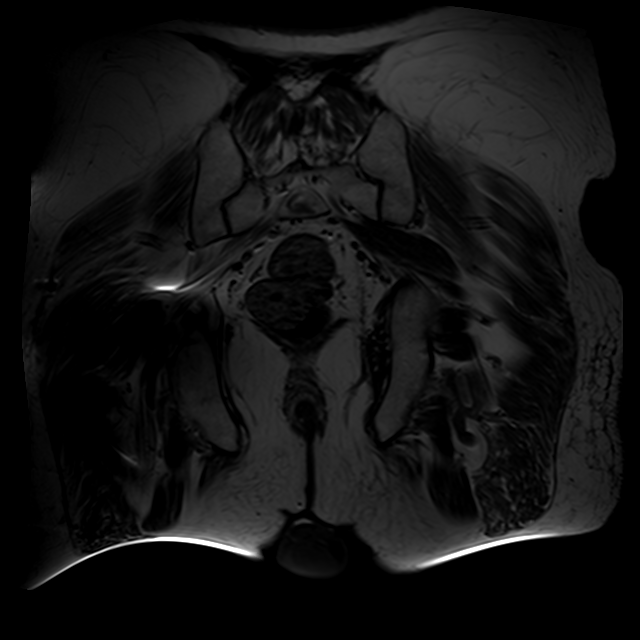
[im 4/19]
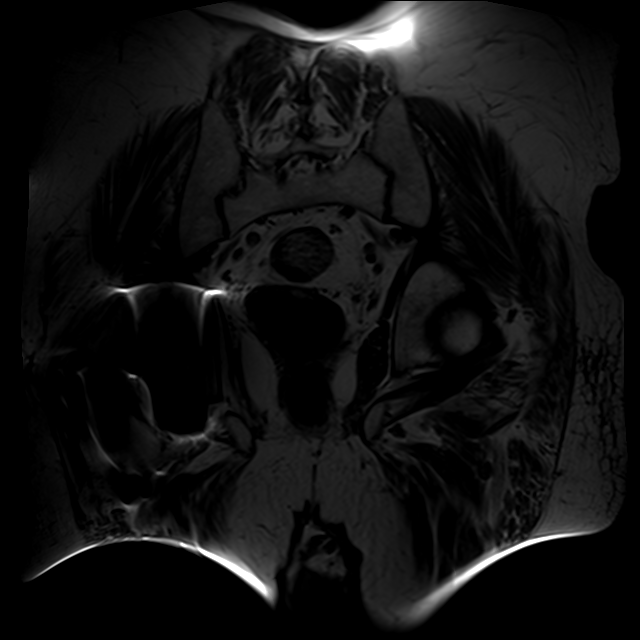
[im 8/19]
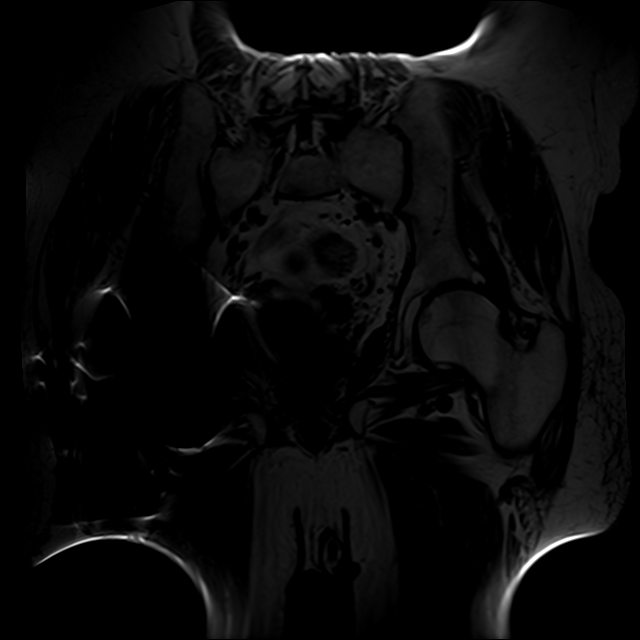
[im 11/19]
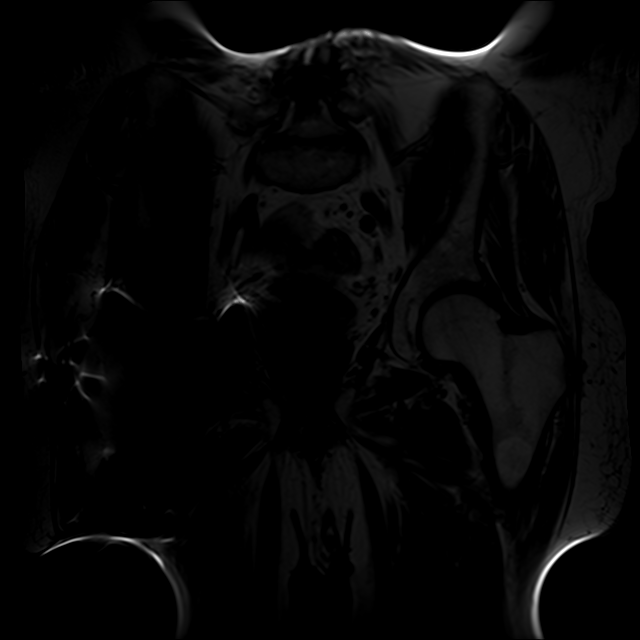
[im 15/19]
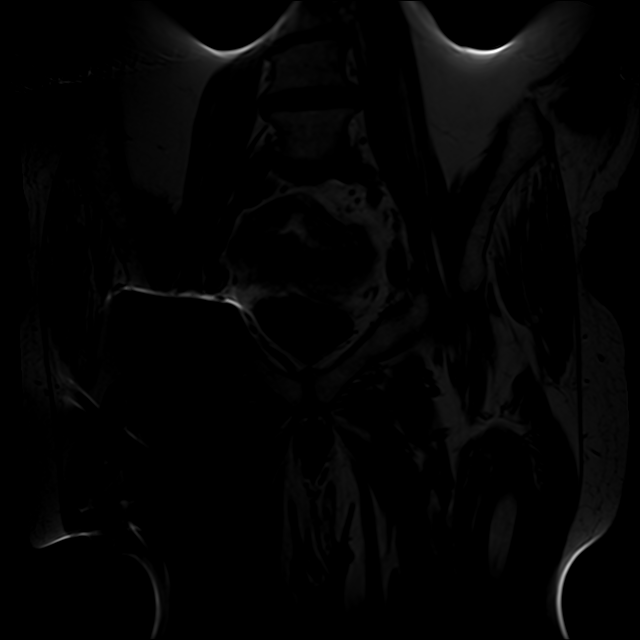
[im 19/19]
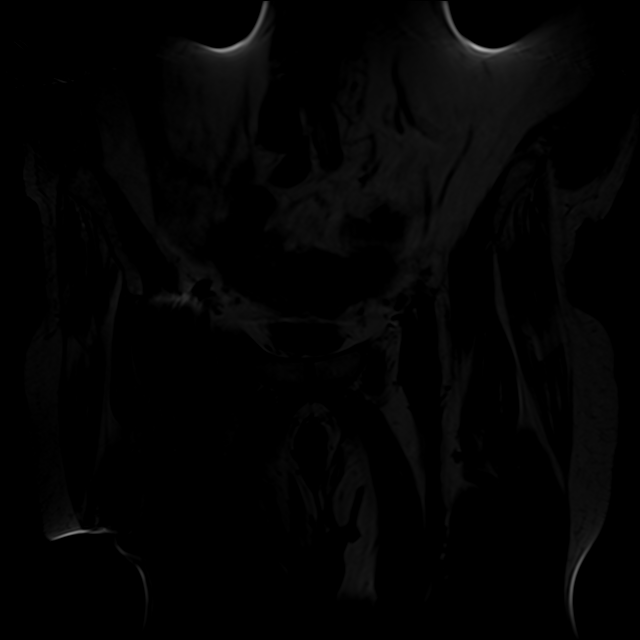

[Series 5: T1 · axial · 4.0mm · 0.59mm/px · z∈[-34,+62]mm · 5 of 25 slices shown (2 of 2)]
[im 1/25]
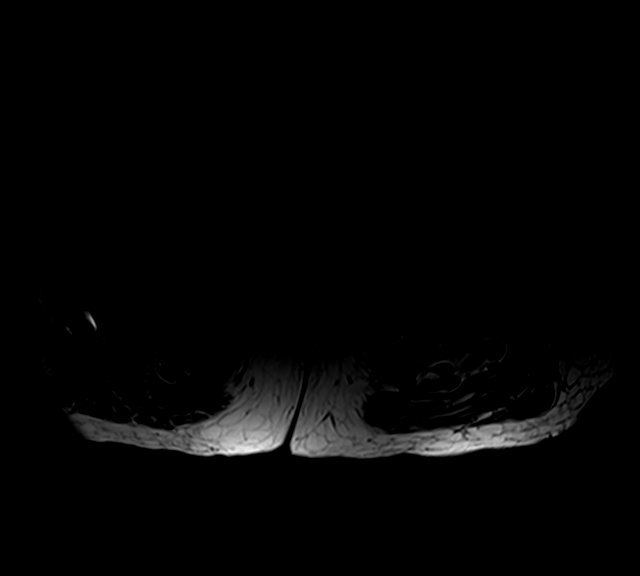
[im 5/25]
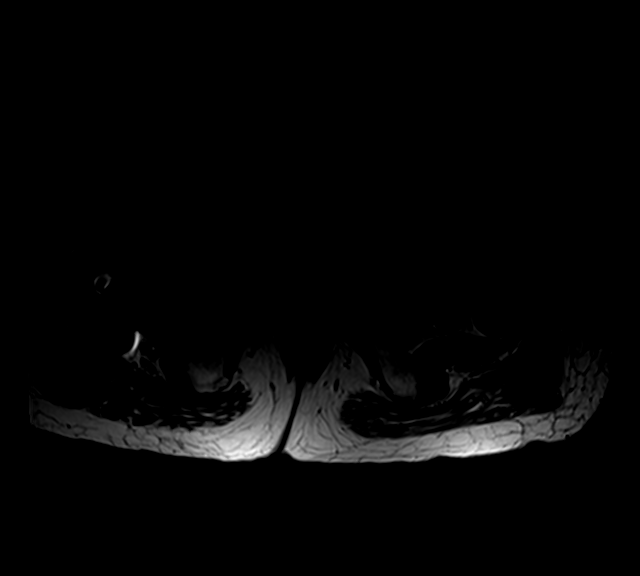
[im 9/25]
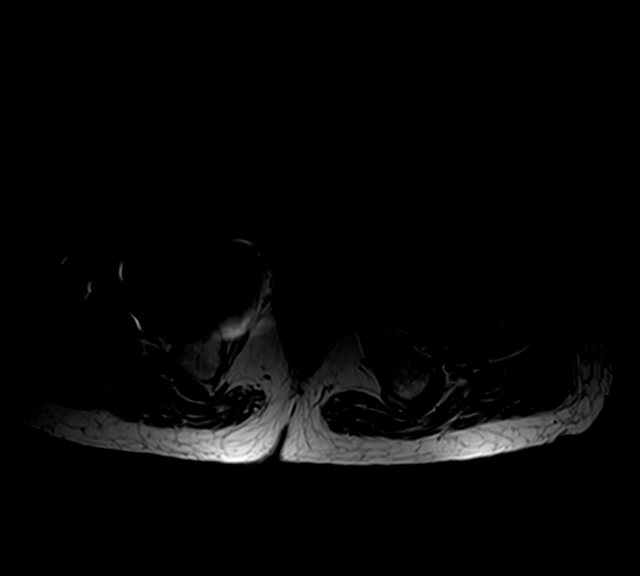
[im 13/25]
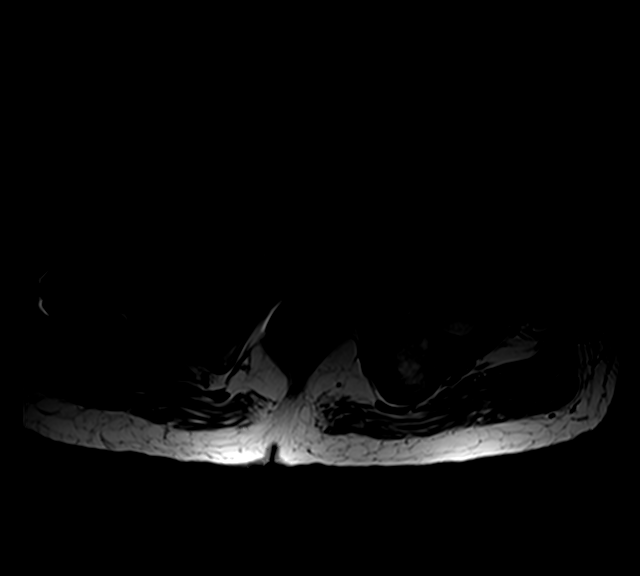
[im 21/25]
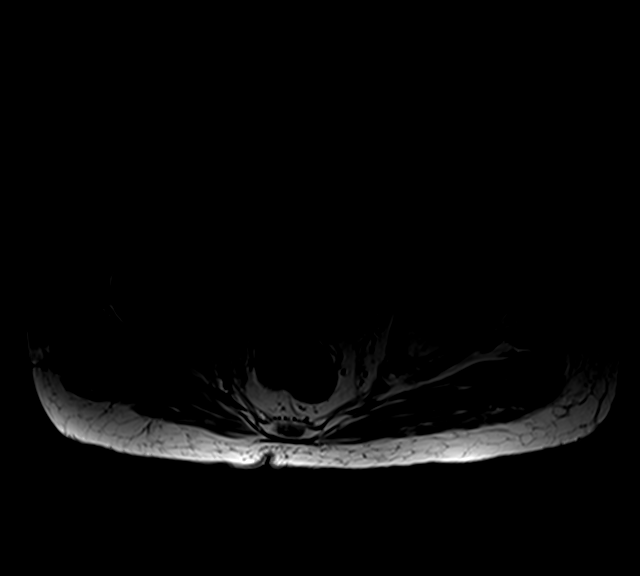

[Series 6: T2 · axial · 4.0mm · 0.74mm/px · z∈[-15,+62]mm · 3 of 25 slices shown]
[im 5/25]
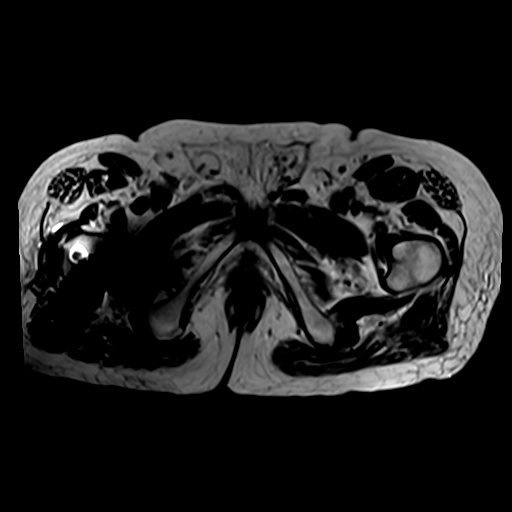
[im 13/25]
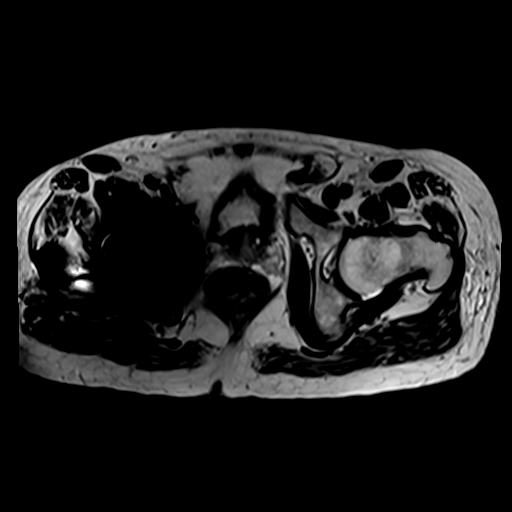
[im 21/25]
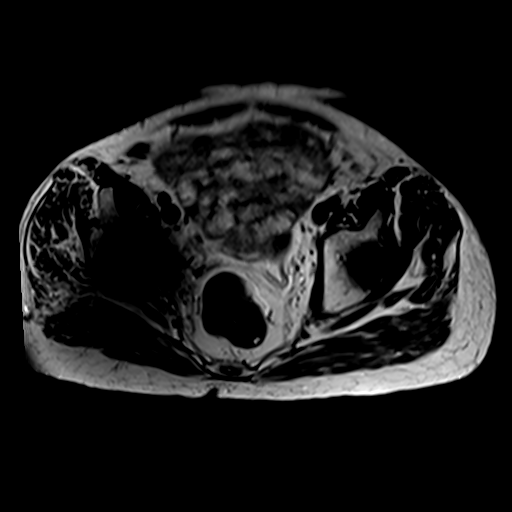

[17 of 40 positions shown; findings below may reference images not displayed]

FINDINGS: Bones: Total right hip arthroplasty without failure complication. No
periarticular fluid collection or osteolysis.

No left hip fracture, dislocation or avascular necrosis.

No sacral signal abnormality.  No sacral fracture.

Articular cartilage and labrum

Articular cartilage: Mild cartilage loss of the left hip with
marginal osteophytosis.

Labrum: Grossly intact left labrum, but evaluation is limited by
lack of intraarticular fluid.

Joint or bursal effusion

Joint effusion:  No left joint effusion.

Bursae:  No bursa formation.

Muscles and tendons

Flexors: Normal.

Extensors: Normal.

Abductors: Normal.

Adductors: Normal.

Rotators: Normal.

Hamstrings: Normal.

Other findings

Miscellaneous: No fluid collection or hematoma. No pelvic free
fluid. No inguinal lymphadenopathy. Small fat containing left
inguinal hernia.
IMPRESSION: 1. Right total hip arthroplasty without osteolysis or periarticular
fluid collection.
2. Mild osteoarthritis of the left hip.

## 2017-03-25 DIAGNOSIS — Z0000 Encounter for general adult medical examination without abnormal findings: Secondary | ICD-10-CM | POA: Diagnosis not present

## 2017-03-25 DIAGNOSIS — I10 Essential (primary) hypertension: Secondary | ICD-10-CM | POA: Diagnosis not present

## 2017-03-25 DIAGNOSIS — E538 Deficiency of other specified B group vitamins: Secondary | ICD-10-CM | POA: Diagnosis not present

## 2017-03-25 DIAGNOSIS — Z125 Encounter for screening for malignant neoplasm of prostate: Secondary | ICD-10-CM | POA: Diagnosis not present

## 2017-03-25 DIAGNOSIS — E7800 Pure hypercholesterolemia, unspecified: Secondary | ICD-10-CM | POA: Diagnosis not present

## 2017-03-30 DIAGNOSIS — Z0001 Encounter for general adult medical examination with abnormal findings: Secondary | ICD-10-CM | POA: Diagnosis not present

## 2017-03-30 DIAGNOSIS — R600 Localized edema: Secondary | ICD-10-CM | POA: Diagnosis not present

## 2017-03-30 DIAGNOSIS — I10 Essential (primary) hypertension: Secondary | ICD-10-CM | POA: Diagnosis not present

## 2017-03-31 ENCOUNTER — Other Ambulatory Visit: Payer: Self-pay | Admitting: Acute Care

## 2017-03-31 DIAGNOSIS — Z122 Encounter for screening for malignant neoplasm of respiratory organs: Secondary | ICD-10-CM

## 2017-03-31 DIAGNOSIS — Z87891 Personal history of nicotine dependence: Secondary | ICD-10-CM

## 2017-04-02 DIAGNOSIS — I6523 Occlusion and stenosis of bilateral carotid arteries: Secondary | ICD-10-CM | POA: Diagnosis not present

## 2017-04-02 DIAGNOSIS — R0989 Other specified symptoms and signs involving the circulatory and respiratory systems: Secondary | ICD-10-CM | POA: Diagnosis not present

## 2017-04-02 DIAGNOSIS — I6529 Occlusion and stenosis of unspecified carotid artery: Secondary | ICD-10-CM | POA: Diagnosis not present

## 2017-04-02 DIAGNOSIS — I739 Peripheral vascular disease, unspecified: Secondary | ICD-10-CM | POA: Diagnosis not present

## 2017-04-03 ENCOUNTER — Ambulatory Visit (INDEPENDENT_AMBULATORY_CARE_PROVIDER_SITE_OTHER): Payer: Medicare Other | Admitting: Acute Care

## 2017-04-03 ENCOUNTER — Encounter: Payer: Self-pay | Admitting: Acute Care

## 2017-04-03 ENCOUNTER — Ambulatory Visit (INDEPENDENT_AMBULATORY_CARE_PROVIDER_SITE_OTHER)
Admission: RE | Admit: 2017-04-03 | Discharge: 2017-04-03 | Disposition: A | Discharge status: Home / Self Care | Payer: Medicare Other | Source: Ambulatory Visit | Attending: Acute Care | Admitting: Acute Care

## 2017-04-03 DIAGNOSIS — Z87891 Personal history of nicotine dependence: Secondary | ICD-10-CM | POA: Diagnosis not present

## 2017-04-03 DIAGNOSIS — Z122 Encounter for screening for malignant neoplasm of respiratory organs: Secondary | ICD-10-CM

## 2017-04-03 NOTE — Progress Notes (Signed)
Shared Decision Making Visit Lung Cancer Screening Program [phone])   Eligibility:  Age 77 y.o.  Pack Years Smoking History Calculation 77 pack year smoking history (# packs/per year x # years smoked)  Recent History of coughing up blood  no  Unexplained weight loss? no ( >Than 15 pounds within the last 6 months )  Prior History Lung / other cancer no (Diagnosis within the last 5 years already requiring surveillance chest CT Scans).  Smoking Status Former Smoker  Former Smokers: Years since quit: 10 years  Quit Date: 03/24/2006  Visit Components:  Discussion included one or more decision making aids. yes  Discussion included risk/benefits of screening. yes  Discussion included potential follow up diagnostic testing for abnormal scans. yes  Discussion included meaning and risk of over diagnosis. yes  Discussion included meaning and risk of False Positives. yes  Discussion included meaning of total radiation exposure. yes  Counseling Included:  Importance of adherence to annual lung cancer LDCT screening. yes  Impact of comorbidities on ability to participate in the program. yes  Ability and willingness to under diagnostic treatment. yes  Smoking Cessation Counseling:  Current Smokers:   Discussed importance of smoking cessation. NA, patient is a non-smoker  Information about tobacco cessation classes and interventions provided to patient. yes  Patient provided with "ticket" for LDCT Scan. yes  Symptomatic Patient. no  Counseling  Diagnosis Code: Tobacco Use Z72.0  Asymptomatic Patient yes  Counseling (Intermediate counseling: > three minutes counseling) Z3664  Former Smokers:   Discussed the importance of maintaining cigarette abstinence. yes  Diagnosis Code: Personal History of Nicotine Dependence. Q03.474  Information about tobacco cessation classes and interventions provided to patient. Yes  Patient provided with "ticket" for LDCT Scan.  yes  Written Order for Lung Cancer Screening with LDCT placed in Epic. Yes (CT Chest Lung Cancer Screening Low Dose W/O CM) [id number] Z12.2-Screening of respiratory organs Z87.891-Personal history of nicotine dependence  I spent 25 minutes of face to face time with Mr. And Mrs. Gist discussing the risks and benefits of lung cancer screening. We viewed a power point together that explained in detail the above noted topics. We took the time to pause the power point at intervals to allow for questions to be asked and answered to ensure understanding. We discussed that he had taken the single most powerful action possible to decrease his risk of developing lung cancer when he quit smoking. I counseled him  to remain smoke free, and to contact me if he ever had the desire to smoke again so that I can provide resources and tools to help support the effort to remain smoke free. We discussed the time and location of the scan, and that either  Doroteo Glassman RN or I will call with the results within  24-48 hours of receiving them. Mr. Hallenbeck  has my card and contact information in the event he needs to speak with me, in addition to a copy of the power point we reviewed as a resource. Mr. And Mrs Glendinning  verbalized understanding of all of the above and had no further questions upon leaving the office.     I explained to the patient that there has been a high incidence of coronary artery disease noted on these exams. I explained that this is a non-gated exam therefore degree or severity cannot be determined. This patient is currently on statin therapy, and is in a blind study utilizing either placebo or IV statin medication. He has his  cholesterol evaluated through the study. I have asked the patient to follow-up with their PCP regarding any incidental finding of coronary artery disease and management with diet or medication as they feel is clinically indicated. The patient verbalized understanding of the above and had no  further questions.      Magdalen Spatz, NP 04/03/2017

## 2017-04-07 ENCOUNTER — Other Ambulatory Visit: Payer: Self-pay | Admitting: Acute Care

## 2017-04-09 DIAGNOSIS — Z86718 Personal history of other venous thrombosis and embolism: Secondary | ICD-10-CM | POA: Diagnosis not present

## 2017-04-09 DIAGNOSIS — Z7901 Long term (current) use of anticoagulants: Secondary | ICD-10-CM | POA: Diagnosis not present

## 2017-04-13 ENCOUNTER — Other Ambulatory Visit: Payer: Self-pay

## 2017-04-13 DIAGNOSIS — I739 Peripheral vascular disease, unspecified: Secondary | ICD-10-CM

## 2017-04-28 ENCOUNTER — Encounter: Payer: Self-pay | Admitting: Neurology

## 2017-04-28 ENCOUNTER — Ambulatory Visit (INDEPENDENT_AMBULATORY_CARE_PROVIDER_SITE_OTHER): Payer: Medicare Other | Admitting: Neurology

## 2017-04-28 VITALS — BP 140/70 | HR 92 | Ht 71.5 in | Wt 199.0 lb

## 2017-04-28 DIAGNOSIS — Z8669 Personal history of other diseases of the nervous system and sense organs: Secondary | ICD-10-CM | POA: Diagnosis not present

## 2017-04-28 NOTE — Progress Notes (Signed)
Subjective:    [name] is a 77 y.o. male.  HPI     Star Age, MD, PhD [facility] [address], [address] [place], [place] [address]  Dear Dr. Shelia Media,   I saw your patient, Steven Guerrero, upon your kind request, in my neurologic clinic today for initial consultation of his sleep disorder, in particular, re-evaluation of his obstructive sleep apnea. The patient is accompanied by his wife today. As you know, Mr. Saindon is a 77 year old right-handed gentleman with an underlying complex medical history of anxiety, depression, back pain, history of stroke and intracranial hemorrhage, hyperlipidemia, hypertension, neuropathy, history of PE, arthritis with status post right total hip replacement, former smoking and mild overweight state, who reports snoring and excessive daytime somnolence. I reviewed your office note from 03/30/2017, which you kindly included. He had a home sleep test about 4 years ago on 06/14/2013, which showed an AHI of 24 per hour, average oxygen saturation of only 88%, nadir of 81% for lowest desaturation. He apparently had a CPAP titration study on 07/19/2013 but a full report is not available. He was titrated on CPAP from 5 cm to 6 cm of water pressure, a home CPAP pressure of 6 cm was recommended at the time. He has not been on CPAP therapy. He does not wish to consider CPAP therapy for sleep apnea. He does not wish to come in for yet another sleep study. Per wife, he never actually had a home sleep test. He is sleepy during the day. He dozes off at times. At night, he sleeps in a recliner. He does snore per wife's report. His Epworth sleepiness score is 17 out of 24, fatigue score is 51 out of 63. He lives at home with his wife. They have 3 grown children. He drinks alcohol in the form of beer, 1-1/2 each night approximately. Caffeine in the form of coffee, 2 cups in the morning. He quit smoking. He retired from Engineer, maintenance ([employer]).  His bedtime is around 8:30 and rise time around 6, often he is awake earlier than this. He has nocturia about once or twice per average night. He had seen Dr. Krista Blue in the past in June 2017 for his gait disorder and neuropathy.  His Past Medical History Is Significant For: Past Medical History:  Diagnosis Date  . Anxiety and depression   . Back pain   . Brain bleed ([place])    2003  . CVA (cerebral infarction)    2001  . Hypercholesteremia   . Hypertension   . Peripheral neuropathy   . Pulmonary embolism ([place])   . Sleep apnea     His Past Surgical History Is Significant For: Past Surgical History:  Procedure Laterality Date  . HERNIA REPAIR    . LEG SURGERY     Broken femur  . TOTAL HIP ARTHROPLASTY     Right    His Family History Is Significant For: Family History  Problem Relation Age of Onset  . Heart attack Father        Mother - health hx unknown    His Social History Is Significant For: Social History   Socioeconomic History  . Marital status: Married    Spouse name: None  . Number of children: 3  . Years of education: HS  . Highest education level: None  Social Needs  . Financial resource strain: None  . Food insecurity - worry: None  . Food insecurity - inability: None  .  Transportation needs - medical: None  . Transportation needs - non-medical: None  Occupational History  . Occupation: Retired  Tobacco Use  . Smoking status: Former Smoker    Packs/day: 1.50    Years: 47.00    Pack years: 70.50    Types: Cigarettes    Last attempt to quit: 03/24/2006    Years since quitting: 11.1  . Smokeless tobacco: Never Used  Substance and Sexual Activity  . Alcohol use: Yes    Alcohol/week: 0.0 oz    Comment: 1 beer per day  . Drug use: No  . Sexual activity: None  Other Topics Concern  . None  Social History Narrative   Lives at home with wife.   Right-handed.   Three children, one living.   2 cups coffee per day.    His Allergies Are:  Allergies   Allergen Reactions  . Atenolol Other (See Comments)    Does not remember reaction  . Lotensin [Benazepril Hcl] Other (See Comments)    Does not remember reaction  . Wellbutrin [Bupropion] Other (See Comments)    Does not remember reaction  :   His Current Medications Are:  Outpatient Encounter Medications as of 04/28/2017  Medication Sig  . atorvastatin (LIPITOR) 40 MG tablet Take 40 mg by mouth daily.  . bacitracin ointment Apply 1 application topically 2 (two) times daily.  . baclofen (LIORESAL) 10 MG tablet TAKE 1 TABLET BY MOUTH AT  BEDTIME  . citalopram (CELEXA) 40 MG tablet Take 40 mg by mouth at bedtime.  . fish oil-omega-3 fatty acids 1000 MG capsule Take 1 g by mouth daily.  Marland Kitchen gabapentin (NEURONTIN) 100 MG capsule TAKE ONE CAPSULE BY MOUTH 2 TIMES A DAY AS DIRECTED  . losartan-hydrochlorothiazide (HYZAAR) 100-25 MG per tablet Take 1 tablet by mouth daily.  . Multiple Vitamin (MULTIVITAMIN WITH MINERALS) TABS tablet Take 1 tablet by mouth daily.  . nitroGLYCERIN (NITRODUR - DOSED IN MG/24 HR) 0.2 mg/hr patch APPLY 1/4 PATCH TO AFFECTED AREA DAILY. CHANGE PATCH DAILY AND SLIGHTLY ALTER LOCATION DAILY  . OVER THE COUNTER MEDICATION Take 1 tablet by mouth daily. Vitamin for vision  . pantoprazole (PROTONIX) 40 MG tablet Take 40 mg by mouth daily.   . traZODone (DESYREL) 50 MG tablet Take 50 mg by mouth at bedtime.  . vitamin B-12 (CYANOCOBALAMIN) 1000 MCG tablet Take 1,000 mcg by mouth daily.  Marland Kitchen warfarin (COUMADIN) 5 MG tablet 5.5 mg 5 days/week 5mg  on Tuesday and Saturday  . [DISCONTINUED] traMADol (ULTRAM) 50 MG tablet Take 1 tablet (50 mg total) by mouth every 6 (six) hours as needed.  . warfarin (COUMADIN) 7.5 MG tablet Take 7.5 mg by mouth daily at 6 PM.   . [DISCONTINUED] amLODipine (NORVASC) 10 MG tablet Take 10 mg by mouth at bedtime.   No facility-administered encounter medications on file as of 04/28/2017.   :  Review of Systems:  Out of a complete 14 point review of  systems, all are reviewed and negative with the exception of these symptoms as listed below:  Review of Systems  Neurological:       Pt presents today to discuss his sleep. Pt has had sleep studies in the past. Pt is not interested in using a cpap but wants to discuss his sleep apnea.  Epworth Sleepiness Scale 0= would never doze 1= slight chance of dozing 2= moderate chance of dozing 3= high chance of dozing  Sitting and reading: 2 Watching TV: 2 Sitting inactive in a  public place (ex. Theater or meeting): 2 As a passenger in a car for an hour without a break: 3 Lying down to rest in the afternoon: 3 Sitting and talking to someone: 1 Sitting quietly after lunch (no alcohol): 3 In a car, while stopped in traffic: 1 Total: 17     Objective:  Neurological Exam  Physical Exam Physical Examination:   Vitals:   04/28/17 1513  BP: 140/70  Pulse: 92    General Examination: The patient is a very pleasant 77 y.o. male in no acute distress. He appears well-developed and well-nourished and well groomed.   HEENT: Normocephalic, atraumatic, pupils are equal, round and reactive to light and accommodation. He wears corrective eyeglasses. He has mild facial weakness. He has significant dysarthria. Airway examination is difficult, he has full dentures on top and partials on the bottom. He has what appears to be moderate airway crowding. Neck circumference is 17-1/2 inches.  Mouth is moderately dry. Top dentures are loose.   Chest: Clear to auscultation without wheezing, rhonchi or crackles noted.  Heart: S1+S2+0, regular and normal without murmurs, rubs or gallops noted.   Abdomen: Soft, non-tender and non-distended with normal bowel sounds appreciated on auscultation.  Extremities: No obvious abnormality, had right hip replacement surgery, fractured right femur.  Skin: Warm and dry without trophic changes noted.  Musculoskeletal: exam reveals no obvious joint deformities,  tenderness or joint swelling or erythema.   Neurologically:  Mental status: The patient is awake, alert and oriented in all 4 spheres. His immediate and remote memory, attention, language skills and fund of knowledge are appropriate. There is no evidence of aphasia, agnosia, apraxia or anomia. Speech is clear with normal prosody and enunciation. Thought process is linear. Mood is normal and affect is blunted.  Cranial nerves II - XII are as described above under HEENT exam.  Motor exam: No obvious weakness, no tremor, Romberg is not tested due to safety concerns. He walks with a 2 wheeled walker. Sensory exam is intact to light touch.   Assessment and Plan:   In summary, Guage Efferson Rodell is a very pleasant 77 y.o.-year old male with an underlying complex medical history of anxiety, depression, back pain, history of stroke and intracranial hemorrhage, hyperlipidemia, hypertension, neuropathy, history of PE, arthritis with status post right total hip replacement, former smoking and mild overweight state, who was previously diagnosed with obstructive sleep apnea. Unclear if he really had an extended sleep study and CPAP titration study but I do have a home sleep test results available from 2015, indicating moderate obstructive sleep apnea. At this juncture, I would recommend reevaluation with a sleep study in consideration of CPAP therapy for sleep apnea afterwards. However, patient is not agreeable to returning for sleep study testing at this time and would not be willing to try CPAP therapy. Alternative treatments are limited in his case. He is advised to call our office or his wife can call our office if he changes his mind. We did talk about the diagnosis of sleep apnea, its prognosis and treatment options at length today. We talked about the risks and ramifications of untreated OSA especially with respect to cardiovascular disease down the road. At this juncture I will see him back on an as-needed basis.  I  answered all their questions today and the patient and his wife were in agreement.   Thank you very much for allowing me to participate in the care of this nice patient. If I can be of any  further assistance to you please do not hesitate to call me at [phone].  Sincerely,   Star Age, MD, PhD

## 2017-04-28 NOTE — Patient Instructions (Signed)
Thank you for choosing [facility] for your sleep related care! It was nice to meet you today! I appreciate that you entrust me with your sleep related healthcare concerns. I hope, I was able to address at least some of your concerns today, and that I can help you feel reassured and also get better.    Here is what we discussed today and what we came up with as our plan for you:    Based on your symptoms and your exam I believe you may still be at risk for obstructive sleep apnea or OSA, and I think we should proceed with a sleep study to determine whether you do or do not have OSA and how severe it is. If you have more than mild OSA, I want you to consider treatment with CPAP. Please remember, the risks and ramifications of moderate to severe obstructive sleep apnea or OSA are: Cardiovascular disease, including congestive heart failure, stroke, difficult to control hypertension, arrhythmias, and even type 2 diabetes has been linked to untreated OSA. Sleep apnea causes disruption of sleep and sleep deprivation in most cases, which, in turn, can cause recurrent headaches, problems with memory, mood, concentration, focus, and vigilance. Most people with untreated sleep apnea report excessive daytime sleepiness, which can affect their ability to drive. Please do not drive if you feel sleepy.   If you change your mind, call. I understand that you are reluctant to pursue another sleep study at this time.  You can call our clinic or sleep lab at [phone]. You can leave a message with your phone number and concerns, if you get the voicemail box. She will call back as soon as possible.

## 2017-04-29 ENCOUNTER — Institutional Professional Consult (permissible substitution): Payer: Medicare Other | Admitting: Neurology

## 2017-05-06 DIAGNOSIS — Z86718 Personal history of other venous thrombosis and embolism: Secondary | ICD-10-CM | POA: Diagnosis not present

## 2017-05-06 DIAGNOSIS — Z7901 Long term (current) use of anticoagulants: Secondary | ICD-10-CM | POA: Diagnosis not present

## 2017-05-11 DIAGNOSIS — Z961 Presence of intraocular lens: Secondary | ICD-10-CM | POA: Diagnosis not present

## 2017-05-27 ENCOUNTER — Ambulatory Visit (HOSPITAL_COMMUNITY)
Admission: RE | Admit: 2017-05-27 | Discharge: 2017-05-27 | Disposition: A | Discharge status: Home / Self Care | Payer: Medicare Other | Source: Ambulatory Visit | Attending: Vascular Surgery | Admitting: Vascular Surgery

## 2017-05-27 ENCOUNTER — Ambulatory Visit (INDEPENDENT_AMBULATORY_CARE_PROVIDER_SITE_OTHER): Payer: Medicare Other | Admitting: Vascular Surgery

## 2017-05-27 ENCOUNTER — Other Ambulatory Visit: Payer: Self-pay

## 2017-05-27 ENCOUNTER — Encounter: Payer: Self-pay | Admitting: Vascular Surgery

## 2017-05-27 VITALS — BP 104/64 | HR 75 | Temp 97.2°F | Resp 20 | Ht 71.5 in | Wt 198.0 lb

## 2017-05-27 DIAGNOSIS — R9389 Abnormal findings on diagnostic imaging of other specified body structures: Secondary | ICD-10-CM | POA: Diagnosis not present

## 2017-05-27 DIAGNOSIS — I739 Peripheral vascular disease, unspecified: Secondary | ICD-10-CM

## 2017-05-27 DIAGNOSIS — I70203 Unspecified atherosclerosis of native arteries of extremities, bilateral legs: Secondary | ICD-10-CM | POA: Insufficient documentation

## 2017-05-27 DIAGNOSIS — Z87891 Personal history of nicotine dependence: Secondary | ICD-10-CM | POA: Insufficient documentation

## 2017-05-27 DIAGNOSIS — Z8673 Personal history of transient ischemic attack (TIA), and cerebral infarction without residual deficits: Secondary | ICD-10-CM | POA: Diagnosis not present

## 2017-05-27 DIAGNOSIS — R0989 Other specified symptoms and signs involving the circulatory and respiratory systems: Secondary | ICD-10-CM | POA: Diagnosis present

## 2017-05-27 NOTE — Progress Notes (Signed)
Patient name: Steven Guerrero MRN: [id number] DOB: [birth year] Sex: male   REASON FOR CONSULT:    Peripheral vascular disease.  The consult is requested by Dr. Deland Pretty.  HPI:   Steven Guerrero is a pleasant 77 y.o. male, who was referred for evaluation of peripheral vascular disease.  The patient describes pain in both lower extremities which she has had for about 2-3 years.  The symptoms are equal on both sides.  He has had a previous stroke in 2000 and had a brain injury in 2001 and therefore his activity is fairly limited.  He walks with a walker.  He describes some occasional calf claudication but this does not occur always.  He describes pain in both hips which occurs with ambulation but also occurs with rest.  He denies any history of rest pain.  He denies any history of nonhealing ulcers.  With respect to his calf pain there are no other aggravating or alleviating factors.  His risk factors for peripheral vascular disease include hypertension, hypercholesterolemia, remote history of tobacco use, and a family history of premature cardiovascular disease.  I have reviewed the records that were sent from the referring office.  The patient was seen on 03/30/2017.  The patient has a history of pulmonary embolus and is on long-term Coumadin therapy.  A total of 35 medical conditions are listed.  The labs showed a creatinine of 1.65.  GFR was 39.  Past Medical History:  Diagnosis Date  . Anxiety and depression   . Back pain   . Brain bleed ([place])    2003  . CVA (cerebral infarction)    2001  . Hypercholesteremia   . Hypertension   . Peripheral neuropathy   . Pulmonary embolism ([place])   . Sleep apnea     Family History  Problem Relation Age of Onset  . Heart attack Father        Mother - health hx unknown  His father had a myocardial infarction in his 35s.  SOCIAL HISTORY: He quit smoking in 2000 Social History   Socioeconomic History  . Marital status: Married    Spouse name:  Not on file  . Number of children: 3  . Years of education: HS  . Highest education level: Not on file  Social Needs  . Financial resource strain: Not on file  . Food insecurity - worry: Not on file  . Food insecurity - inability: Not on file  . Transportation needs - medical: Not on file  . Transportation needs - non-medical: Not on file  Occupational History  . Occupation: Retired  Tobacco Use  . Smoking status: Former Smoker    Packs/day: 1.50    Years: 47.00    Pack years: 70.50    Types: Cigarettes    Last attempt to quit: 03/24/2006    Years since quitting: 11.1  . Smokeless tobacco: Never Used  Substance and Sexual Activity  . Alcohol use: Yes    Alcohol/week: 0.0 oz    Comment: 1 beer per day  . Drug use: No  . Sexual activity: Not on file  Other Topics Concern  . Not on file  Social History Narrative   Lives at home with wife.   Right-handed.   Three children, one living.   2 cups coffee per day.    Allergies  Allergen Reactions  . Atenolol Other (See Comments)    Does not remember reaction  . Lotensin [Benazepril Hcl] Other (See Comments)  Does not remember reaction  . Wellbutrin [Bupropion] Other (See Comments)    Does not remember reaction    Current Outpatient Medications  Medication Sig Dispense Refill  . atorvastatin (LIPITOR) 40 MG tablet Take 40 mg by mouth daily.    . baclofen (LIORESAL) 10 MG tablet TAKE 1 TABLET BY MOUTH AT  BEDTIME (Patient taking differently: Takes twice a day.) 90 tablet 1  . citalopram (CELEXA) 40 MG tablet Take 40 mg by mouth at bedtime.    . fish oil-omega-3 fatty acids 1000 MG capsule Take 1 g by mouth daily.    Marland Kitchen gabapentin (NEURONTIN) 100 MG capsule TAKE ONE CAPSULE BY MOUTH 2 TIMES A DAY AS DIRECTED  1  . losartan-hydrochlorothiazide (HYZAAR) 100-25 MG per tablet Take 1 tablet by mouth daily.    . Multiple Vitamin (MULTIVITAMIN WITH MINERALS) TABS tablet Take 1 tablet by mouth daily.    . nitroGLYCERIN (NITRODUR -  DOSED IN MG/24 HR) 0.2 mg/hr patch APPLY 1/4 PATCH TO AFFECTED AREA DAILY. CHANGE PATCH DAILY AND SLIGHTLY ALTER LOCATION DAILY 30 patch 1  . OVER THE COUNTER MEDICATION Take 1 tablet by mouth daily. Vitamin for vision    . pantoprazole (PROTONIX) 40 MG tablet Take 40 mg by mouth daily.     . traZODone (DESYREL) 50 MG tablet Take 50 mg by mouth at bedtime.    . vitamin B-12 (CYANOCOBALAMIN) 1000 MCG tablet Take 1,000 mcg by mouth daily.    Marland Kitchen warfarin (COUMADIN) 5 MG tablet 5.5 mg 5 days/week 5mg  on Tuesday and Saturday    . warfarin (COUMADIN) 7.5 MG tablet Take 7.5 mg by mouth daily at 6 PM.      No current facility-administered medications for this visit.     REVIEW OF SYSTEMS:  [X]  denotes positive finding, [ ]  denotes negative finding Cardiac  Comments:  Chest pain or chest pressure: x   Shortness of breath upon exertion: x   Short of breath when lying flat:    Irregular heart rhythm:        Vascular    Pain in calf, thigh, or hip brought on by ambulation:    Pain in feet at night that wakes you up from your sleep:     Blood clot in your veins:    Leg swelling:         Pulmonary    Oxygen at home:    Productive cough:     Wheezing:         Neurologic    Sudden weakness in arms or legs:     Sudden numbness in arms or legs:     Sudden onset of difficulty speaking or slurred speech:    Temporary loss of vision in one eye:     Problems with dizziness:         Gastrointestinal    Blood in stool:     Vomited blood:         Genitourinary    Burning when urinating:     Blood in urine:        Psychiatric    Major depression:         Hematologic    Bleeding problems:    Problems with blood clotting too easily:        Skin    Rashes or ulcers: x       Constitutional    Fever or chills:     PHYSICAL EXAM:   Vitals:   05/27/17 1452  BP:  104/64  Pulse: 75  Resp: 20  Temp: (!) 97.2 F (36.2 C)  TempSrc: Oral  SpO2: 92%  Weight: 198 lb (89.8 kg)  Height: 5'  11.5" (1.816 m)    GENERAL: The patient is a well-nourished male, in no acute distress. The vital signs are documented above. CARDIAC: There is a regular rate and rhythm.  VASCULAR: I do not detect carotid bruits. I was unable to assess his femoral pulses as he did not feel safe getting up on the exam table.  I examined him in the chair. I could not palpate popliteal or pedal pulses. He has mild bilateral lower extremity swelling which is more significant on the left side. PULMONARY: There is good air exchange bilaterally without wheezing or rales. ABDOMEN: Soft and non-tender with normal pitched bowel sounds.  MUSCULOSKELETAL: There are no major deformities or cyanosis. NEUROLOGIC: He has some expressive a aphasia.   SKIN: There are no ulcers or rashes noted. PSYCHIATRIC: The patient has a normal affect.  DATA:    BILATERAL LOWER EXTREMITY ARTERIAL DOPPLER STUDY: I have independently interpreted the study today.  On the right side there is a triphasic dorsalis pedis signal with a biphasic posterior tibial signal ABI is 64%.  Toe pressure on the right is 53 mmHg.  On the left side there is a triphasic dorsalis pedis and posterior tibial signal.  ABIs 64%.  Toe pressure on the left is 60 mmHg.  BILATERAL LOWER EXTREMITY ARTERIAL DUPLEX: I have independently interpreted the duplex study today.  On the right side there is a triphasic waveform in the common femoral artery and deep femoral artery.  The superficial femoral artery is occluded and there are monophasic signals below this.  On the left side there is a triphasic common femoral and deep femoral arterial signal.  There are normal signals throughout the superficial femoral artery on the left.  There is an area of increased velocity in the left superficial femoral artery suggesting a 50-74% stenosis.  MEDICAL ISSUES:   BILATERAL INFRAINGUINAL ARTERIAL OCCLUSIVE DISEASE: Based on his exam and  noninvasive studies this patient has  bilateral infrainguinal arterial occlusive disease.  However he has minimal symptoms.  Therefore I would not recommend an aggressive workup for this.  He would be at high risk for any intervention given his age and multiple medical comorbidities.  I encouraged him to stay as active as possible.  We have also discussed the importance of nutrition.  Fortunately he is not a smoker.  We have also discussed the importance of taking  good care of his feet as if he develops a wound on his foot this could quickly become a more serious problem.  I have encouraged him to keep the skin well lubricated in the winter and to make sure that the shoes always fit comfortably.  If he develops worsening leg pain or rest pain that I would be happy to see him back at any time.  Deitra Mayo [facility] [phone]

## 2017-06-08 DIAGNOSIS — Z7901 Long term (current) use of anticoagulants: Secondary | ICD-10-CM | POA: Diagnosis not present

## 2017-06-08 DIAGNOSIS — Z86718 Personal history of other venous thrombosis and embolism: Secondary | ICD-10-CM | POA: Diagnosis not present

## 2017-06-18 ENCOUNTER — Other Ambulatory Visit: Payer: Self-pay | Admitting: Sports Medicine

## 2017-06-18 DIAGNOSIS — M25551 Pain in right hip: Secondary | ICD-10-CM

## 2017-07-06 DIAGNOSIS — Z7901 Long term (current) use of anticoagulants: Secondary | ICD-10-CM | POA: Diagnosis not present

## 2017-07-06 DIAGNOSIS — Z86718 Personal history of other venous thrombosis and embolism: Secondary | ICD-10-CM | POA: Diagnosis not present

## 2017-08-03 DIAGNOSIS — Z86718 Personal history of other venous thrombosis and embolism: Secondary | ICD-10-CM | POA: Diagnosis not present

## 2017-08-03 DIAGNOSIS — Z7901 Long term (current) use of anticoagulants: Secondary | ICD-10-CM | POA: Diagnosis not present

## 2017-08-31 DIAGNOSIS — Z86718 Personal history of other venous thrombosis and embolism: Secondary | ICD-10-CM | POA: Diagnosis not present

## 2017-08-31 DIAGNOSIS — Z7901 Long term (current) use of anticoagulants: Secondary | ICD-10-CM | POA: Diagnosis not present

## 2017-09-28 DIAGNOSIS — Z7901 Long term (current) use of anticoagulants: Secondary | ICD-10-CM | POA: Diagnosis not present

## 2017-09-28 DIAGNOSIS — Z86718 Personal history of other venous thrombosis and embolism: Secondary | ICD-10-CM | POA: Diagnosis not present

## 2017-10-26 DIAGNOSIS — Z7901 Long term (current) use of anticoagulants: Secondary | ICD-10-CM | POA: Diagnosis not present

## 2017-10-26 DIAGNOSIS — Z86718 Personal history of other venous thrombosis and embolism: Secondary | ICD-10-CM | POA: Diagnosis not present

## 2017-11-24 DIAGNOSIS — Z7901 Long term (current) use of anticoagulants: Secondary | ICD-10-CM | POA: Diagnosis not present

## 2017-11-24 DIAGNOSIS — Z86718 Personal history of other venous thrombosis and embolism: Secondary | ICD-10-CM | POA: Diagnosis not present

## 2017-11-27 DIAGNOSIS — Z23 Encounter for immunization: Secondary | ICD-10-CM | POA: Diagnosis not present

## 2017-11-29 ENCOUNTER — Other Ambulatory Visit: Payer: Self-pay | Admitting: Sports Medicine

## 2017-12-11 DIAGNOSIS — L570 Actinic keratosis: Secondary | ICD-10-CM | POA: Diagnosis not present

## 2017-12-11 DIAGNOSIS — D0439 Carcinoma in situ of skin of other parts of face: Secondary | ICD-10-CM | POA: Diagnosis not present

## 2017-12-11 DIAGNOSIS — X32XXXD Exposure to sunlight, subsequent encounter: Secondary | ICD-10-CM | POA: Diagnosis not present

## 2017-12-23 DIAGNOSIS — Z86718 Personal history of other venous thrombosis and embolism: Secondary | ICD-10-CM | POA: Diagnosis not present

## 2017-12-23 DIAGNOSIS — Z7901 Long term (current) use of anticoagulants: Secondary | ICD-10-CM | POA: Diagnosis not present

## 2017-12-27 ENCOUNTER — Other Ambulatory Visit: Payer: Self-pay | Admitting: Sports Medicine

## 2017-12-27 DIAGNOSIS — M25551 Pain in right hip: Secondary | ICD-10-CM

## 2018-01-07 DIAGNOSIS — Z7901 Long term (current) use of anticoagulants: Secondary | ICD-10-CM | POA: Diagnosis not present

## 2018-01-07 DIAGNOSIS — Z86718 Personal history of other venous thrombosis and embolism: Secondary | ICD-10-CM | POA: Diagnosis not present

## 2018-01-15 ENCOUNTER — Other Ambulatory Visit: Payer: Self-pay

## 2018-01-15 NOTE — Patient Outreach (Signed)
[facility]) Care Management  01/15/2018  Steven Guerrero 06-20-1940 [id number]   Medication Adherence call to Steven Guerrero left a message for patient to call back patient is due on Losartan 100/25 mg. Steven Guerrero is showing past due under [place].   [facility] Management Direct Dial [phone]  Fax [phone] [email or web address]

## 2018-01-26 ENCOUNTER — Other Ambulatory Visit: Payer: Self-pay

## 2018-01-26 NOTE — Patient Outreach (Signed)
[facility]) Care Management  01/26/2018  Steven Guerrero 04/06/1940 [id number]   Medication Adherence call to [name] left a message for patient to call back patient is due on Losartan / HCTZ 100/25 mg Steven Guerrero is showing past due under [place].   [facility] Management Direct Dial [phone]  Fax [phone] [email or web address]

## 2018-02-04 DIAGNOSIS — Z7901 Long term (current) use of anticoagulants: Secondary | ICD-10-CM | POA: Diagnosis not present

## 2018-02-04 DIAGNOSIS — Z86718 Personal history of other venous thrombosis and embolism: Secondary | ICD-10-CM | POA: Diagnosis not present

## 2018-02-17 DIAGNOSIS — X32XXXD Exposure to sunlight, subsequent encounter: Secondary | ICD-10-CM | POA: Diagnosis not present

## 2018-02-17 DIAGNOSIS — C44229 Squamous cell carcinoma of skin of left ear and external auricular canal: Secondary | ICD-10-CM | POA: Diagnosis not present

## 2018-02-17 DIAGNOSIS — Z85828 Personal history of other malignant neoplasm of skin: Secondary | ICD-10-CM | POA: Diagnosis not present

## 2018-02-17 DIAGNOSIS — Z08 Encounter for follow-up examination after completed treatment for malignant neoplasm: Secondary | ICD-10-CM | POA: Diagnosis not present

## 2018-02-17 DIAGNOSIS — L570 Actinic keratosis: Secondary | ICD-10-CM | POA: Diagnosis not present

## 2018-03-04 DIAGNOSIS — I10 Essential (primary) hypertension: Secondary | ICD-10-CM | POA: Diagnosis not present

## 2018-03-04 DIAGNOSIS — Z7901 Long term (current) use of anticoagulants: Secondary | ICD-10-CM | POA: Diagnosis not present

## 2018-03-04 DIAGNOSIS — Z86718 Personal history of other venous thrombosis and embolism: Secondary | ICD-10-CM | POA: Diagnosis not present

## 2018-03-26 DIAGNOSIS — Z08 Encounter for follow-up examination after completed treatment for malignant neoplasm: Secondary | ICD-10-CM | POA: Diagnosis not present

## 2018-03-26 DIAGNOSIS — Z85828 Personal history of other malignant neoplasm of skin: Secondary | ICD-10-CM | POA: Diagnosis not present

## 2018-03-31 DIAGNOSIS — E7800 Pure hypercholesterolemia, unspecified: Secondary | ICD-10-CM | POA: Diagnosis not present

## 2018-03-31 DIAGNOSIS — I10 Essential (primary) hypertension: Secondary | ICD-10-CM | POA: Diagnosis not present

## 2018-04-01 DIAGNOSIS — I10 Essential (primary) hypertension: Secondary | ICD-10-CM | POA: Diagnosis not present

## 2018-04-01 DIAGNOSIS — Z86718 Personal history of other venous thrombosis and embolism: Secondary | ICD-10-CM | POA: Diagnosis not present

## 2018-04-01 DIAGNOSIS — Z7901 Long term (current) use of anticoagulants: Secondary | ICD-10-CM | POA: Diagnosis not present

## 2018-04-01 DIAGNOSIS — Z8673 Personal history of transient ischemic attack (TIA), and cerebral infarction without residual deficits: Secondary | ICD-10-CM | POA: Diagnosis not present

## 2018-04-02 ENCOUNTER — Emergency Department (HOSPITAL_COMMUNITY): Payer: Medicare Other

## 2018-04-02 ENCOUNTER — Other Ambulatory Visit: Payer: Self-pay

## 2018-04-02 ENCOUNTER — Encounter (HOSPITAL_COMMUNITY): Payer: Self-pay | Admitting: *Deleted

## 2018-04-02 ENCOUNTER — Observation Stay (HOSPITAL_COMMUNITY): Payer: Medicare Other

## 2018-04-02 ENCOUNTER — Observation Stay (HOSPITAL_COMMUNITY)
Admission: EM | Admit: 2018-04-02 | Discharge: 2018-04-05 | Disposition: A | Discharge status: Home / Self Care | Payer: Medicare Other | Attending: Internal Medicine | Admitting: Internal Medicine

## 2018-04-02 ENCOUNTER — Observation Stay (HOSPITAL_BASED_OUTPATIENT_CLINIC_OR_DEPARTMENT_OTHER): Payer: Medicare Other

## 2018-04-02 DIAGNOSIS — M79604 Pain in right leg: Secondary | ICD-10-CM | POA: Diagnosis not present

## 2018-04-02 DIAGNOSIS — G459 Transient cerebral ischemic attack, unspecified: Secondary | ICD-10-CM | POA: Insufficient documentation

## 2018-04-02 DIAGNOSIS — Z86711 Personal history of pulmonary embolism: Secondary | ICD-10-CM | POA: Diagnosis not present

## 2018-04-02 DIAGNOSIS — Z96649 Presence of unspecified artificial hip joint: Secondary | ICD-10-CM

## 2018-04-02 DIAGNOSIS — E876 Hypokalemia: Secondary | ICD-10-CM | POA: Diagnosis not present

## 2018-04-02 DIAGNOSIS — R531 Weakness: Principal | ICD-10-CM | POA: Insufficient documentation

## 2018-04-02 DIAGNOSIS — Z7901 Long term (current) use of anticoagulants: Secondary | ICD-10-CM | POA: Diagnosis not present

## 2018-04-02 DIAGNOSIS — M79606 Pain in leg, unspecified: Secondary | ICD-10-CM | POA: Insufficient documentation

## 2018-04-02 DIAGNOSIS — I6529 Occlusion and stenosis of unspecified carotid artery: Secondary | ICD-10-CM | POA: Diagnosis not present

## 2018-04-02 DIAGNOSIS — Z79899 Other long term (current) drug therapy: Secondary | ICD-10-CM | POA: Insufficient documentation

## 2018-04-02 DIAGNOSIS — I129 Hypertensive chronic kidney disease with stage 1 through stage 4 chronic kidney disease, or unspecified chronic kidney disease: Secondary | ICD-10-CM | POA: Insufficient documentation

## 2018-04-02 DIAGNOSIS — M79605 Pain in left leg: Secondary | ICD-10-CM | POA: Diagnosis present

## 2018-04-02 DIAGNOSIS — Z87891 Personal history of nicotine dependence: Secondary | ICD-10-CM | POA: Diagnosis not present

## 2018-04-02 DIAGNOSIS — M7989 Other specified soft tissue disorders: Secondary | ICD-10-CM | POA: Diagnosis not present

## 2018-04-02 DIAGNOSIS — N183 Chronic kidney disease, stage 3 (moderate): Secondary | ICD-10-CM | POA: Insufficient documentation

## 2018-04-02 DIAGNOSIS — I10 Essential (primary) hypertension: Secondary | ICD-10-CM | POA: Diagnosis not present

## 2018-04-02 DIAGNOSIS — R29898 Other symptoms and signs involving the musculoskeletal system: Secondary | ICD-10-CM

## 2018-04-02 DIAGNOSIS — Z8673 Personal history of transient ischemic attack (TIA), and cerebral infarction without residual deficits: Secondary | ICD-10-CM

## 2018-04-02 DIAGNOSIS — I4510 Unspecified right bundle-branch block: Secondary | ICD-10-CM | POA: Diagnosis not present

## 2018-04-02 DIAGNOSIS — R200 Anesthesia of skin: Secondary | ICD-10-CM | POA: Diagnosis present

## 2018-04-02 HISTORY — DX: Unspecified intracranial injury with loss of consciousness of unspecified duration, initial encounter: S06.9X9A

## 2018-04-02 HISTORY — DX: Cerebral infarction, unspecified: I63.9

## 2018-04-02 HISTORY — DX: Unspecified intracranial injury with loss of consciousness status unknown, initial encounter: S06.9XAA

## 2018-04-02 HISTORY — DX: Transient cerebral ischemic attack, unspecified: G45.9

## 2018-04-02 LAB — TSH: TSH: 1.83 u[IU]/mL (ref 0.350–4.500)

## 2018-04-02 LAB — DIFFERENTIAL
Abs Immature Granulocytes: 0.03 10*3/uL (ref 0.00–0.07)
Basophils Absolute: 0 10*3/uL (ref 0.0–0.1)
Basophils Relative: 0 %
Eosinophils Absolute: 0 10*3/uL (ref 0.0–0.5)
Eosinophils Relative: 0 %
Immature Granulocytes: 0 %
LYMPHS PCT: 6 %
Lymphs Abs: 0.6 10*3/uL — ABNORMAL LOW (ref 0.7–4.0)
Monocytes Absolute: 0.9 10*3/uL (ref 0.1–1.0)
Monocytes Relative: 9 %
Neutro Abs: 8.8 10*3/uL — ABNORMAL HIGH (ref 1.7–7.7)
Neutrophils Relative %: 85 %

## 2018-04-02 LAB — COMPREHENSIVE METABOLIC PANEL
ALT: 23 U/L (ref 0–44)
AST: 31 U/L (ref 15–41)
Albumin: 3.9 g/dL (ref 3.5–5.0)
Alkaline Phosphatase: 71 U/L (ref 38–126)
Anion gap: 10 (ref 5–15)
BUN: 18 mg/dL (ref 8–23)
CO2: 23 mmol/L (ref 22–32)
Calcium: 9.6 mg/dL (ref 8.9–10.3)
Chloride: 106 mmol/L (ref 98–111)
Creatinine, Ser: 1.53 mg/dL — ABNORMAL HIGH (ref 0.61–1.24)
GFR calc Af Amer: 50 mL/min — ABNORMAL LOW (ref >60)
GFR calc non Af Amer: 43 mL/min — ABNORMAL LOW (ref >60)
GLUCOSE: 98 mg/dL (ref 70–99)
Potassium: 4.1 mmol/L (ref 3.5–5.1)
Sodium: 139 mmol/L (ref 135–145)
Total Bilirubin: 3.1 mg/dL — ABNORMAL HIGH (ref 0.3–1.2)
Total Protein: 7.4 g/dL (ref 6.5–8.1)

## 2018-04-02 LAB — I-STAT TROPONIN, ED: Troponin i, poc: 0.05 ng/mL (ref 0.00–0.08)

## 2018-04-02 LAB — PROTIME-INR
INR: 1.57
Prothrombin Time: 18.6 seconds — ABNORMAL HIGH (ref 11.4–15.2)

## 2018-04-02 LAB — URINALYSIS, ROUTINE W REFLEX MICROSCOPIC
BILIRUBIN URINE: NEGATIVE
Glucose, UA: NEGATIVE mg/dL
Hgb urine dipstick: NEGATIVE
Ketones, ur: 5 mg/dL — AB
Nitrite: NEGATIVE
Protein, ur: 100 mg/dL — AB
Specific Gravity, Urine: 1.016 (ref 1.005–1.030)
pH: 6 (ref 5.0–8.0)

## 2018-04-02 LAB — CBC
HEMATOCRIT: 42 % (ref 39.0–52.0)
HEMOGLOBIN: 13.7 g/dL (ref 13.0–17.0)
MCH: 31.1 pg (ref 26.0–34.0)
MCHC: 32.6 g/dL (ref 30.0–36.0)
MCV: 95.2 fL (ref 80.0–100.0)
Platelets: 249 10*3/uL (ref 150–400)
RBC: 4.41 MIL/uL (ref 4.22–5.81)
RDW: 14 % (ref 11.5–15.5)
WBC: 10.4 10*3/uL (ref 4.0–10.5)
nRBC: 0 % (ref 0.0–0.2)

## 2018-04-02 LAB — ECHOCARDIOGRAM COMPLETE
HEIGHTINCHES: 70.5 in
Weight: 3200 oz

## 2018-04-02 LAB — HEMOGLOBIN A1C
HEMOGLOBIN A1C: 5.8 % — AB (ref 4.8–5.6)
Mean Plasma Glucose: 119.76 mg/dL

## 2018-04-02 LAB — APTT: aPTT: 31 seconds (ref 24–36)

## 2018-04-02 MED ORDER — ATORVASTATIN CALCIUM 40 MG PO TABS
40.0000 mg | ORAL_TABLET | Freq: Every day | ORAL | Status: DC
  Administered 2018-04-03: 40 mg via ORAL
  Filled 2018-04-02: qty 1

## 2018-04-02 MED ORDER — IOPAMIDOL (ISOVUE-370) INJECTION 76%
100.0000 mL | Freq: Once | INTRAVENOUS | Status: AC | PRN
  Administered 2018-04-02: 100 mL via INTRAVENOUS

## 2018-04-02 MED ORDER — PANTOPRAZOLE SODIUM 40 MG PO TBEC
40.0000 mg | DELAYED_RELEASE_TABLET | Freq: Every day | ORAL | Status: DC
  Administered 2018-04-03 – 2018-04-05 (×3): 40 mg via ORAL
  Filled 2018-04-02 (×4): qty 1

## 2018-04-02 MED ORDER — BACLOFEN 10 MG PO TABS: 10.0000 mg | ORAL_TABLET | Freq: Every evening | ORAL | Status: DC | PRN

## 2018-04-02 MED ORDER — WARFARIN SODIUM 7.5 MG PO TABS
7.5000 mg | ORAL_TABLET | Freq: Once | ORAL | Status: AC
  Administered 2018-04-02: 7.5 mg via ORAL
  Filled 2018-04-02 (×2): qty 1

## 2018-04-02 MED ORDER — GABAPENTIN 100 MG PO CAPS
100.0000 mg | ORAL_CAPSULE | Freq: Two times a day (BID) | ORAL | Status: DC
  Administered 2018-04-02 – 2018-04-05 (×6): 100 mg via ORAL
  Filled 2018-04-02 (×6): qty 1

## 2018-04-02 MED ORDER — STROKE: EARLY STAGES OF RECOVERY BOOK
Freq: Once | Status: AC
  Administered 2018-04-02: 18:00:00
  Filled 2018-04-02: qty 1

## 2018-04-02 MED ORDER — TRAZODONE HCL 50 MG PO TABS
50.0000 mg | ORAL_TABLET | Freq: Every day | ORAL | Status: DC
  Administered 2018-04-02 – 2018-04-04 (×3): 50 mg via ORAL
  Filled 2018-04-02 (×3): qty 1

## 2018-04-02 MED ORDER — VITAMIN B-12 1000 MCG PO TABS
1000.0000 ug | ORAL_TABLET | Freq: Every day | ORAL | Status: DC
  Administered 2018-04-02 – 2018-04-05 (×4): 1000 ug via ORAL
  Filled 2018-04-02 (×4): qty 1

## 2018-04-02 MED ORDER — WARFARIN - PHARMACIST DOSING INPATIENT: Freq: Every day | Status: DC

## 2018-04-02 MED ORDER — ASPIRIN EC 81 MG PO TBEC
81.0000 mg | DELAYED_RELEASE_TABLET | Freq: Every day | ORAL | Status: DC
  Administered 2018-04-02 – 2018-04-03 (×2): 81 mg via ORAL
  Filled 2018-04-02 (×2): qty 1

## 2018-04-02 MED ORDER — ENOXAPARIN SODIUM 40 MG/0.4ML ~~LOC~~ SOLN
40.0000 mg | SUBCUTANEOUS | Status: DC
  Administered 2018-04-02: 40 mg via SUBCUTANEOUS
  Filled 2018-04-02 (×2): qty 0.4

## 2018-04-02 MED ORDER — CITALOPRAM HYDROBROMIDE 40 MG PO TABS
40.0000 mg | ORAL_TABLET | Freq: Every day | ORAL | Status: DC
  Administered 2018-04-02 – 2018-04-04 (×3): 40 mg via ORAL
  Filled 2018-04-02 (×3): qty 1

## 2018-04-02 MED ORDER — HYDRALAZINE HCL 20 MG/ML IJ SOLN: 10.0000 mg | INTRAMUSCULAR | Status: DC | PRN

## 2018-04-02 NOTE — Plan of Care (Signed)
  Problem: Education: Goal: Knowledge of General Education information will improve Description Including pain rating scale, medication(s)/side effects and non-pharmacologic comfort measures Outcome: Progressing   Problem: Health Behavior/Discharge Planning: Goal: Ability to manage health-related needs will improve Outcome: Progressing   Problem: Clinical Measurements: Goal: Ability to maintain clinical measurements within normal limits will improve Outcome: Progressing Goal: Will remain free from infection Outcome: Progressing Goal: Diagnostic test results will improve Outcome: Progressing Goal: Respiratory complications will improve Outcome: Progressing Goal: Cardiovascular complication will be avoided Outcome: Progressing   Problem: Activity: Goal: Risk for activity intolerance will decrease Outcome: Progressing   Problem: Nutrition: Goal: Adequate nutrition will be maintained Outcome: Progressing   Problem: Coping: Goal: Level of anxiety will decrease Outcome: Progressing   Problem: Elimination: Goal: Will not experience complications related to bowel motility Outcome: Progressing Goal: Will not experience complications related to urinary retention Outcome: Progressing   Problem: Pain Managment: Goal: General experience of comfort will improve Outcome: Progressing   Problem: Safety: Goal: Ability to remain free from injury will improve Outcome: Progressing   Problem: Skin Integrity: Goal: Risk for impaired skin integrity will decrease Outcome: Progressing   Problem: Education: Goal: Knowledge of disease or condition will improve Outcome: Progressing Goal: Knowledge of secondary prevention will improve Outcome: Progressing Goal: Knowledge of patient specific risk factors addressed and post discharge goals established will improve Outcome: Progressing Goal: Individualized Educational Video(s) Outcome: Progressing   Problem: Coping: Goal: Will verbalize  positive feelings about self Outcome: Progressing   Problem: Health Behavior/Discharge Planning: Goal: Ability to manage health-related needs will improve Outcome: Progressing   Problem: Self-Care: Goal: Ability to participate in self-care as condition permits will improve Outcome: Progressing Goal: Verbalization of feelings and concerns over difficulty with self-care will improve Outcome: Progressing Goal: Ability to communicate needs accurately will improve Outcome: Progressing   Problem: Nutrition: Goal: Risk of aspiration will decrease Outcome: Progressing Goal: Dietary intake will improve Outcome: Progressing   Problem: Intracerebral Hemorrhage Tissue Perfusion: Goal: Complications of Intracerebral Hemorrhage will be minimized Outcome: Progressing   Problem: Ischemic Stroke/TIA Tissue Perfusion: Goal: Complications of ischemic stroke/TIA will be minimized Outcome: Progressing   Problem: Spontaneous Subarachnoid Hemorrhage Tissue Perfusion: Goal: Complications of Spontaneous Subarachnoid Hemorrhage will be minimized Outcome: Progressing

## 2018-04-02 NOTE — Progress Notes (Signed)
ANTICOAGULATION CONSULT NOTE - Initial Consult  Pharmacy Consult for warfarin Indication: pulmonary embolus  Allergies  Allergen Reactions  . Atenolol Other (See Comments)    Does not remember reaction  . Lotensin [Benazepril Hcl] Other (See Comments)    Does not remember reaction  . Wellbutrin [Bupropion] Other (See Comments)    Does not remember reaction    Patient Measurements: Height: 5' 10.5" (179.1 cm) Weight: 200 lb (90.7 kg) IBW/kg (Calculated) : 74.15  Vital Signs: Temp: 97.7 F (36.5 C) (01/10 1053) BP: 178/85 (01/10 1500) Pulse Rate: 90 (01/10 1500)  Labs: Recent Labs    04/02/18 1127  HGB 13.7  HCT 42.0  PLT 249  APTT 31  LABPROT 18.6*  INR 1.57  CREATININE 1.53*    Estimated Creatinine Clearance: 45.5 mL/min (A) (by C-G formula based on SCr of 1.53 mg/dL (H)).  Assessment: CC/HPI: 78 yo m presenting with numbness  PMH: CVA TBI HTN HLD hx PE On warfarin  Anticoag: warfarin pta for PE. Admit INR 1.57 last dose 1/9  PTA warfarin 5 mg TuSa, 7.5 mg AOD  Renal: Scr 1.53  Heme/Onc: H&H 13.7/42, Plt 219  Goal of Therapy:  INR 2-3 Monitor platelets by anticoagulation protocol: Yes   Plan:  Warfarin 7.5 mg x 1  Daily INR  Levester Fresh, PharmD, BCPS, BCCCP Clinical Pharmacist [phone]   Please check AMION for all [facility] numbers  04/02/2018 3:25 PM

## 2018-04-02 NOTE — Evaluation (Signed)
Physical Therapy Evaluation Patient Details Name: Steven Guerrero MRN: [id number] DOB: [birth year] Today's Date: 04/02/2018   History of Present Illness  Steven Guerrero is a 78 y.o. male with medical history significant for TIA, traumatic brain injury, right hip arthropathy, history of pulmonary embolism on long-term Coumadin therapy comes to the emergency room with right leg weakness which is already improving on the way to ER.  Pt's right LE continues with pain and weakness.  Clinical Impression  Pt admitted with/for R LE weakness and found to have anterior thigh pain.  Imaging has ruled out stroke.  Pt reports pain in the R leg is new and is likely the major contributing factor that he needs minimal assist for safety..  Pt currently limited functionally due to the problems listed below.  (see problems list.)  Pt will benefit from PT to maximize function and safety to be able to get home safely with available assist.     Follow Up Recommendations Home health PT;Supervision/Assistance - 24 hour    Equipment Recommendations  None recommended by PT    Recommendations for Other Services       Precautions / Restrictions Precautions Precautions: Fall Restrictions Weight Bearing Restrictions: No      Mobility  Bed Mobility Overal bed mobility: Needs Assistance Bed Mobility: Supine to Sit;Sit to Supine     Supine to sit: Min guard;HOB elevated(rail) Sit to supine: Supervision   General bed mobility comments: repositioned with feet blocked  Transfers Overall transfer level: Needs assistance   Transfers: Sit to/from Stand Sit to Stand: Min guard         General transfer comment: mildly unsteady and "gimpy" on R LE  Ambulation/Gait Ambulation/Gait assistance: Min guard Gait Distance (Feet): 50 Feet Assistive device: Rolling walker (2 wheeled) Gait Pattern/deviations: Step-to pattern Gait velocity: moderate Gait velocity interpretation: 1.31 - 2.62 ft/sec, indicative of limited  community ambulator General Gait Details: Antalgic gait on the right with L LE variably lagging behind.  I'm unsure if this is due to past stroke or pt having difficulty w/bearing through the painful R LE  Stairs            Wheelchair Mobility    Modified Rankin (Stroke Patients Only)       Balance Overall balance assessment: Needs assistance Sitting-balance support: No upper extremity supported Sitting balance-Leahy Scale: Fair     Standing balance support: No upper extremity supported;Single extremity supported Standing balance-Leahy Scale: Fair Standing balance comment: prefers the RW or stationary object, but can maintain static standing without assist                             Pertinent Vitals/Pain Pain Assessment: Faces Faces Pain Scale: Hurts little more(or more) Pain Location: right anterior thigh with movement, MMT or w/bearing Pain Descriptors / Indicators: Sore;Sharp Pain Intervention(s): Limited activity within patient's tolerance;Monitored during session    Home Living Family/patient expects to be discharged to:: Private residence Living Arrangements: Spouse/significant other Available Help at Discharge: Family;Available PRN/intermittently(up to 24 hours) Type of Home: House Home Access: Stairs to enter   [employer] of Steps: 1 Home Layout: One level Home Equipment: Walker - 2 wheels;Walker - 4 wheels;Shower seat      Prior Function Level of Independence: Independent with assistive device(s)         Comments: drives to specific places.  Independent with the RW up until this incident     Hand Dominance  Extremity/Trunk Assessment   Upper Extremity Assessment Upper Extremity Assessment: Defer to OT evaluation    Lower Extremity Assessment Lower Extremity Assessment: Overall WFL for tasks assessed;RLE deficits/detail;LLE deficits/detail(generalized weakness proximal large muscle groups on the R) RLE Deficits  / Details: hip flexors, quads weak and painful at 4-, hams at 4/5 RLE Coordination: decreased fine motor LLE Deficits / Details: hip flexors weak at 4/5       Communication   Communication: Other (comment)(dysarthric, but intelligible)  Cognition Arousal/Alertness: Awake/alert Behavior During Therapy: WFL for tasks assessed/performed Overall Cognitive Status: History of cognitive impairments - at baseline                                 General Comments: ho of TBI      General Comments General comments (skin integrity, edema, etc.): pt is a mediocre historian and has difficulty relating what is now different from the recent past.    Exercises     Assessment/Plan    PT Assessment Patient needs continued PT services  PT Problem List Decreased strength;Decreased activity tolerance;Decreased balance;Decreased mobility;Decreased coordination;Decreased knowledge of use of DME;Pain       PT Treatment Interventions DME instruction;Gait training;Functional mobility training;Therapeutic activities;Balance training;Patient/family education;Neuromuscular re-education    PT Goals (Current goals can be found in the Care Plan section)  Acute Rehab PT Goals Patient Stated Goal: pt didn't specify any goals PT Goal Formulation: Patient unable to participate in goal setting Time For Goal Achievement: 04/09/18 Potential to Achieve Goals: Good    Frequency Min 3X/week   Barriers to discharge        Co-evaluation               AM-PAC PT "6 Clicks" Mobility  Outcome Measure Help needed turning from your back to your side while in a flat bed without using bedrails?: A Little Help needed moving from lying on your back to sitting on the side of a flat bed without using bedrails?: A Little Help needed moving to and from a bed to a chair (including a wheelchair)?: A Little Help needed standing up from a chair using your arms (e.g., wheelchair or bedside chair)?: A  Little Help needed to walk in hospital room?: A Little Help needed climbing 3-5 steps with a railing? : A Little 6 Click Score: 18    End of Session   Activity Tolerance: Patient tolerated treatment well Patient left: in bed;with call bell/phone within reach;with bed alarm set Nurse Communication: Mobility status PT Visit Diagnosis: Unsteadiness on feet (R26.81);Other abnormalities of gait and mobility (R26.89);Pain Pain - Right/Left: Right Pain - part of body: (ant thigh)    Time: 7829-5621 PT Time Calculation (min) (ACUTE ONLY): 21 min   Charges:   PT Evaluation $PT Eval Moderate Complexity: 1 Mod          04/02/2018  Magnet Bing, PT Acute Rehabilitation Services [phone]  (pager) [phone]  (office)  Eliseo Gum Kailia Starry 04/02/2018, 6:29 PM

## 2018-04-02 NOTE — ED Provider Notes (Signed)
[facility] EMERGENCY DEPARTMENT Provider Note   CSN: [id number] Arrival date & time: 04/02/18  1028     History   Chief Complaint Chief Complaint  Patient presents with  . Numbness    HPI Sly Parlee Marcon is a 78 y.o. male.  78 year old male with past medical history including CVA, TBI with residual memory problems and speech problems, hypertension, hyperlipidemia, PE, OSA, peripheral neuropathy who presents with right leg weakness.  Patient went to bed at 9 PM last night in his usual state of health.  He woke up around 3 AM and noted that he was not able to use his right foot.  This morning he got up and was still unable to use his right foot therefore they called EMS.  He has never had this problem before.  He denies any complaints of pain.  No recent fevers or illness.  No recent changes to medications.  The history is provided by the patient and a relative.    Past Medical History:  Diagnosis Date  . Anxiety and depression   . Back pain   . Brain bleed ([place])    2003  . CVA (cerebral infarction)    2001  . Hypercholesteremia   . Hypertension   . Peripheral neuropathy   . Pulmonary embolism ([place])   . Sleep apnea   . Stroke ([place])    2001  . TBI (traumatic brain injury) [facility])     Patient Active Problem List   Diagnosis Date Noted  . H/O total hip arthroplasty 10/09/2016  . Laceration of right forearm 10/09/2016  . Long term current use of anticoagulant 02/08/2016  . Right hip pain 09/12/2015  . Abnormality of gait 08/28/2015  . Head trauma 08/28/2015  . Stroke ([place]) 08/28/2015  . Neck pain 08/28/2015  . Low back pain 08/28/2015    Past Surgical History:  Procedure Laterality Date  . HERNIA REPAIR    . JOINT REPLACEMENT Right    hip replacement  . LEG SURGERY     Broken femur  . TOTAL HIP ARTHROPLASTY     Right        Home Medications    Prior to Admission medications   Medication Sig Start Date End Date Taking? Authorizing  Provider  atorvastatin (LIPITOR) 40 MG tablet Take 40 mg by mouth daily.    [provider]  baclofen (LIORESAL) 10 MG tablet TAKE 1 TABLET BY MOUTH AT  BEDTIME Patient taking differently: Takes twice a day. 10/20/16   Gerda Diss, DO  citalopram (CELEXA) 40 MG tablet Take 40 mg by mouth at bedtime.    [provider]  fish oil-omega-3 fatty acids 1000 MG capsule Take 1 g by mouth daily.    [provider]  gabapentin (NEURONTIN) 100 MG capsule TAKE ONE CAPSULE BY MOUTH 2 TIMES A DAY AS DIRECTED 08/09/15   [provider]  losartan-hydrochlorothiazide (HYZAAR) 100-25 MG per tablet Take 1 tablet by mouth daily.    [provider]  Multiple Vitamin (MULTIVITAMIN WITH MINERALS) TABS tablet Take 1 tablet by mouth daily.    [provider]  nitroGLYCERIN (NITRODUR - DOSED IN MG/24 HR) 0.2 mg/hr patch APPLY 1/4 PATCH TO AFFECTED AREA DAILY. CHANGE PATCH DAILY AND SLIGHTLY ALTER LOCATION DAILY 06/18/17   Gerda Diss, DO  OVER THE COUNTER MEDICATION Take 1 tablet by mouth daily. Vitamin for vision    [provider]  pantoprazole (PROTONIX) 40 MG tablet Take 40 mg by  mouth daily.  02/15/16   [provider]  traZODone (DESYREL) 50 MG tablet Take 50 mg by mouth at bedtime.    [provider]  vitamin B-12 (CYANOCOBALAMIN) 1000 MCG tablet Take 1,000 mcg by mouth daily.    [provider]  warfarin (COUMADIN) 5 MG tablet 5.5 mg 5 days/week 5mg  on Tuesday and Saturday 02/22/17   [provider]  warfarin (COUMADIN) 7.5 MG tablet Take 7.5 mg by mouth daily at 6 PM.     [provider]    Family History Family History  Problem Relation Age of Onset  . Heart attack Father        Mother - health hx unknown    Social History Social History   Tobacco Use  . Smoking status: Former Smoker    Packs/day: 1.50    Years: 47.00    Pack years: 70.50    Types: Cigarettes    Last attempt to quit:  03/24/2006    Years since quitting: 12.0  . Smokeless tobacco: Never Used  Substance Use Topics  . Alcohol use: Yes    Alcohol/week: 0.0 standard drinks    Comment: 1 beer per day  . Drug use: No     Allergies   Atenolol; Lotensin [benazepril hcl]; and Wellbutrin [bupropion]   Review of Systems Review of Systems All other systems reviewed and are negative except that which was mentioned in HPI   Physical Exam Updated Vital Signs BP (!) 151/96 (BP Location: Right Arm)   Pulse 83   Temp 97.7 F (36.5 C)   Resp 18   Ht 5' 10.5" (1.791 m)   Wt 90.7 kg   SpO2 98%   BMI 28.29 kg/m   Physical Exam Vitals signs and nursing note reviewed.  Constitutional:      General: He is not in acute distress.    Appearance: He is well-developed.     Comments: Awake, alert  HENT:     Head: Normocephalic and atraumatic.  Eyes:     Extraocular Movements: Extraocular movements intact.     Conjunctiva/sclera: Conjunctivae normal.     Pupils: Pupils are equal, round, and reactive to light.  Neck:     Musculoskeletal: Neck supple.  Cardiovascular:     Rate and Rhythm: Normal rate and regular rhythm.     Heart sounds: Normal heart sounds. No murmur.  Pulmonary:     Effort: Pulmonary effort is normal. No respiratory distress.     Breath sounds: Normal breath sounds.  Abdominal:     General: Bowel sounds are normal. There is no distension.     Palpations: Abdomen is soft.     Tenderness: There is no abdominal tenderness.  Skin:    General: Skin is warm and dry.  Neurological:     Mental Status: He is alert and oriented to person, place, and time.     Cranial Nerves: No cranial nerve deficit.     Motor: No abnormal muscle tone.     Deep Tendon Reflexes: Reflexes are normal and symmetric. Reflexes normal.     Comments: Slightly slurred speech, normal finger-to-nose testing, negative pronator drift, no clonus 5/5 strength BUE 5/5 strength LLE 4/5 strength RLE straight leg raise and  plantar flexion, 5/5 dorsiflexion  normal sensation x all 4 extremities  Psychiatric:        Thought Content: Thought content normal.      ED Treatments / Results  Labs (all labs ordered are listed, but only abnormal  results are displayed) Labs Reviewed  PROTIME-INR  APTT  CBC  DIFFERENTIAL  COMPREHENSIVE METABOLIC PANEL  URINALYSIS, ROUTINE W REFLEX MICROSCOPIC  I-STAT TROPONIN, ED    EKG EKG Interpretation  Date/Time:  Friday April 02 2018 10:34:06 EST Ventricular Rate:  84 PR Interval:    QRS Duration: 125 QT Interval:  408 QTC Calculation: 483 R Axis:   25 Text Interpretation:  Sinus rhythm IVCD, consider atypical RBBB more pronounced ST depression in V3 and T wave inversions in III Confirmed by Theotis Burrow [phone]) on 04/02/2018 10:59:39 AM   Radiology No results found.  Procedures Procedures (including critical care time)  Medications Ordered in ED Medications - No data to display   Initial Impression / Assessment and Plan / ED Course  I have reviewed the triage vital signs and the nursing notes.  Pertinent labs & imaging results that were available during my care of the patient were reviewed by me and considered in my medical decision making (see chart for details).     PT had mild R leg weakness on exam, unknown time of onset overnight. Outside tPA window.  Lab work unremarkable.  Head CT negative acute.  Contacted neurology and patient evaluated by Dr. Rory Percy.  Weakness has improved by his exam.  He suspects TIA and has recommended admission for TIA work-up.  I have ordered MRI brain and patient admitted by [facility], Dr. Sloan Leiter.  Final Clinical Impressions(s) / ED Diagnoses   Final diagnoses:  Transient right leg weakness    ED Discharge Orders    None       , Wenda Overland, MD 04/02/18 1646

## 2018-04-02 NOTE — Progress Notes (Signed)
  Echocardiogram 2D Echocardiogram has been performed.  Steven Guerrero 04/02/2018, 5:54 PM

## 2018-04-02 NOTE — H&P (Signed)
History and Physical    Rainer Mounce Vanderpol PPI:[id number] DOB: [birth year] DOA: 04/02/2018  PCP: Deland Pretty, MD  Patient coming from: home   I have personally briefly reviewed patient's old medical records in [place] and Chart everywhere.   Chief Complaint: right leg weakness.   HPI: Steven Guerrero is a 78 y.o. male with medical history significant of history of TIA, traumatic brain injury, right hip arthropathy, history of pulmonary embolism on long-term Coumadin therapy comes to the emergency room with right leg weakness which is already improving on the way to ER.  Patient is accompanied by patient's wife and daughter.  According to the patient, he woke up at about 3 AM in the morning, he noticed that he cannot stand up or walk because of right leg weakness.  He had gone to bathroom normally without trouble last night 9 PM before going to bed.  Patient did feel that his right leg was weak in the morning.  He was brought into the ER.  In the emergency room, on initial examination his right leg was thought to be slightly weak on the hip, however it has subsequently been normalized.  Patient is slightly anxious about the weakness otherwise denies any symptoms.  Patient does have some weakness on the right side after his hip replacement.  Patient denies any nausea, vomiting, headache, dizziness, syncopal episode.  Patient denies any chest pain, shortness of breath, wheezing or palpitations.  Denies any facial droop.  He denies any change in voice, family stated that his voice is usual.  No recent flulike symptoms.  Urine and bowel habits are normal.  Currently in the ER, he is hungry otherwise no other symptoms.  Patient is on Coumadin, his INR was 1.9 yesterday as per family.  It is 1.56 today.  ED Course: Patient was hemodynamically stable.  He was hypertensive with blood pressure 190s.  Most of his symptoms were resolved with suspicious right leg weakness on arrival to the ER.  Also seen by neurology.  Patient  was not a TPA candidate because of rapid improvement of symptoms and out of TPA window.  He is not endovascular intervention candidate as his symptoms are mostly improved.  Patient was also seen by neurologist in the ER.  Patient passed swallow evaluation in the ER.  Review of Systems: As per HPI otherwise 10 point review of systems negative.    Past Medical History:  Diagnosis Date  . Anxiety and depression   . Back pain   . Brain bleed ([place])    2003  . CVA (cerebral infarction)    2001  . Hypercholesteremia   . Hypertension   . Peripheral neuropathy   . Pulmonary embolism ([place])   . Sleep apnea   . Stroke ([place])    2001  . TBI (traumatic brain injury) [facility])     Past Surgical History:  Procedure Laterality Date  . HERNIA REPAIR    . JOINT REPLACEMENT Right    hip replacement  . LEG SURGERY     Broken femur  . TOTAL HIP ARTHROPLASTY     Right     reports that he quit smoking about 12 years ago. His smoking use included cigarettes. He has a 70.50 pack-year smoking history. He has never used smokeless tobacco. He reports current alcohol use. He reports that he does not use drugs.  Allergies  Allergen Reactions  . Atenolol Other (See Comments)    Does not remember reaction  . Lotensin [Benazepril Hcl]  Other (See Comments)    Does not remember reaction  . Wellbutrin [Bupropion] Other (See Comments)    Does not remember reaction    Family History  Problem Relation Age of Onset  . Heart attack Father        Mother - health hx unknown     Prior to Admission medications   Medication Sig Start Date End Date Taking? Authorizing Provider  atorvastatin (LIPITOR) 40 MG tablet Take 40 mg by mouth daily.   Yes [provider]  baclofen (LIORESAL) 10 MG tablet TAKE 1 TABLET BY MOUTH AT  BEDTIME Patient taking differently: Take 10 mg by mouth at bedtime as needed for muscle spasms.  10/20/16  Yes Gerda Diss, DO  citalopram (CELEXA) 40 MG tablet Take 40 mg by mouth  at bedtime.   Yes [provider]  fish oil-omega-3 fatty acids 1000 MG capsule Take 1 g by mouth daily.   Yes [provider]  gabapentin (NEURONTIN) 100 MG capsule Take 100 mg by mouth 2 (two) times daily.  08/09/15  Yes [provider]  losartan-hydrochlorothiazide (HYZAAR) 100-25 MG per tablet Take 1 tablet by mouth daily.   Yes [provider]  Multiple Vitamin (MULTIVITAMIN WITH MINERALS) TABS tablet Take 1 tablet by mouth daily.   Yes [provider]  nitroGLYCERIN (NITRODUR - DOSED IN MG/24 HR) 0.2 mg/hr patch APPLY 1/4 PATCH TO AFFECTED AREA DAILY. CHANGE PATCH DAILY AND SLIGHTLY ALTER LOCATION DAILY Patient taking differently: Place 0.2 mg onto the skin daily as needed (pain).  06/18/17  Yes Gerda Diss, DO  OVER THE COUNTER MEDICATION Take 1 tablet by mouth daily. Vitamin for vision **areds**   Yes [provider]  pantoprazole (PROTONIX) 40 MG tablet Take 40 mg by mouth daily.  02/15/16  Yes [provider]  traZODone (DESYREL) 50 MG tablet Take 50 mg by mouth at bedtime.   Yes [provider]  vitamin B-12 (CYANOCOBALAMIN) 1000 MCG tablet Take 1,000 mcg by mouth daily.   Yes [provider]  warfarin (COUMADIN) 5 MG tablet Take 5-7.5 mg by mouth See admin instructions. 5.5 mg 5 days/week 5mg  on Tuesday and Saturday 02/22/17  Yes [provider]    Physical Exam: Vitals:   04/02/18 1200 04/02/18 1230 04/02/18 1300 04/02/18 1415  BP: (!) 166/97 (!) 128/95 (!) 157/75 (!) 164/96  Pulse: 84 79 85 82  Resp: (!) 23 18 13 14   Temp:      SpO2: 94% 96% 95% 96%  Weight:      Height:        Constitutional: NAD, calm, comfortable Vitals:   04/02/18 1200 04/02/18 1230 04/02/18 1300 04/02/18 1415  BP: (!) 166/97 (!) 128/95 (!) 157/75 (!) 164/96  Pulse: 84 79 85 82  Resp: (!) 23 18 13 14   Temp:      SpO2: 94% 96% 95% 96%  Weight:      Height:       Eyes: PERRL, lids and conjunctivae  normal ENMT: Mucous membranes are moist. Posterior pharynx clear of any exudate or lesions.Normal dentition.  Neck: normal, supple, no masses, no thyromegaly Respiratory: clear to auscultation bilaterally, no wheezing, no crackles. Normal respiratory effort. No accessory muscle use.  Cardiovascular: Regular rate and rhythm, no murmurs / rubs / gallops. No extremity edema. 2+ pedal pulses. No carotid bruits.  Abdomen: no tenderness, no masses palpated. No hepatosplenomegaly. Bowel sounds positive.  Musculoskeletal: no clubbing / cyanosis. No joint deformity upper and  lower extremities. Good ROM, no contractures. Normal muscle tone.  Skin: no rashes, lesions, ulcers. No induration Psychiatric: Normal judgment and insight. Alert and oriented x 3. Normal mood.  Neurologic: CN 2-12 grossly intact. Sensation intact, DTR normal. Strength 5/5 in all 4.  Gait not examined because of fall risk. Patient otherwise does not have any weaknesses.  Labs on Admission: I have personally reviewed following labs and imaging studies  CBC: Recent Labs  Lab 04/02/18 1127  WBC 10.4  NEUTROABS 8.8*  HGB 13.7  HCT 42.0  MCV 95.2  PLT 856   Basic Metabolic Panel: Recent Labs  Lab 04/02/18 1127  NA 139  K 4.1  CL 106  CO2 23  GLUCOSE 98  BUN 18  CREATININE 1.53*  CALCIUM 9.6   GFR: Estimated Creatinine Clearance: 45.5 mL/min (A) (by C-G formula based on SCr of 1.53 mg/dL (H)). Liver Function Tests: Recent Labs  Lab 04/02/18 1127  AST 31  ALT 23  ALKPHOS 71  BILITOT 3.1*  PROT 7.4  ALBUMIN 3.9   No results for input(s): LIPASE, AMYLASE in the last 168 hours. No results for input(s): AMMONIA in the last 168 hours. Coagulation Profile: Recent Labs  Lab 04/02/18 1127  INR 1.57   Cardiac Enzymes: No results for input(s): CKTOTAL, CKMB, CKMBINDEX, TROPONINI in the last 168 hours. BNP (last 3 results) No results for input(s): PROBNP in the last 8760 hours. HbA1C: No results for  input(s): HGBA1C in the last 72 hours. CBG: No results for input(s): GLUCAP in the last 168 hours. Lipid Profile: No results for input(s): CHOL, HDL, LDLCALC, TRIG, CHOLHDL, LDLDIRECT in the last 72 hours. Thyroid Function Tests: No results for input(s): TSH, T4TOTAL, FREET4, T3FREE, THYROIDAB in the last 72 hours. Anemia Panel: No results for input(s): VITAMINB12, FOLATE, FERRITIN, TIBC, IRON, RETICCTPCT in the last 72 hours. Urine analysis:    Component Value Date/Time   COLORURINE YELLOW 07/14/2009 1843   APPEARANCEUR CLEAR 07/14/2009 1843   LABSPEC 1.010 07/14/2009 1843   PHURINE 5.0 07/14/2009 1843   GLUCOSEU NEGATIVE 07/14/2009 1843   HGBUR MODERATE (A) 07/14/2009 1843   BILIRUBINUR NEGATIVE 07/14/2009 1843   KETONESUR NEGATIVE 07/14/2009 1843   PROTEINUR NEGATIVE 07/14/2009 1843   UROBILINOGEN 0.2 07/14/2009 1843   NITRITE NEGATIVE 07/14/2009 1843   LEUKOCYTESUR TRACE (A) 07/14/2009 1843    Radiological Exams on Admission: Ct Head Wo Contrast  Result Date: 04/02/2018 CLINICAL DATA:  Right leg weakness for several hours EXAM: CT HEAD WITHOUT CONTRAST TECHNIQUE: Contiguous axial images were obtained from the base of the skull through the vertex without intravenous contrast. COMPARISON:  10/01/2016 FINDINGS: Brain: Diffuse atrophic changes and chronic white matter ischemic changes are again noted and stable. Left thalamic lacunar infarct is noted. No findings to suggest acute hemorrhage, acute infarction or space-occupying mass lesion is noted. Vascular: No hyperdense vessel or unexpected calcification. Skull: Normal. Negative for fracture or focal lesion. Sinuses/Orbits: No acute finding. Other: None. IMPRESSION: Chronic atrophic and ischemic changes stable from the prior exam. No acute abnormality noted. Electronically Signed   By: Inez Catalina M.D.   On: 04/02/2018 12:32    EKG: Independently reviewed.  Twelve-lead EKG shows normal sinus rhythm with right bundle branch block  pattern.  Assessment/Plan Principal Problem:   TIA (transient ischemic attack) Active Problems:   Long term current use of anticoagulant   H/O total hip arthroplasty   Essential hypertension   History of stroke   History of pulmonary embolus (PE)  TIA: Suspected. Admit to telemetry unit.  Vital signs and neuro checks as per stroke protocol. MRI of the brain, CTA of the head and neck, 2D echocardiogram. Neurology consult and follow-up.  PT OT and speech therapy evaluation. Patient passed bedside swallow evaluation in the ER, is subtherapeutic on Coumadin, will start patient on aspirin 81 mg daily, he is already on high-dose statin that he will continue. Permissive hypertension, controlled only when it is more than 220, written hydralazine as needed. Further management as per neurology recommendation. Symptoms could be from right hip arthropathy, however will treat as TIA.  History of PE with long-term use of Coumadin: Subtherapeutic.  Using Coumadin.  Pharmacy to dose.  Lovenox for prophylaxis as INR is less than 1.6.  Hypertension: Blood pressures elevated.  Allowing permissive hypertension.  Control only when it is more than 220.   DVT prophylaxis: Lovenox, subtherapeutic on Coumadin.  Continue Lovenox until INR more than 1.6. Code Status: Full code. Family Communication: Wife and daughter at the bedside. Disposition Plan: Home with outpatient PT. Consults called: Neurology. Admission status: Observation telemetry.   Barb Merino MD [facility] Pager [phone]  If 7PM-7AM, please contact night-coverage [email or web address] Password [email or web address]  04/02/2018, 2:57 PM

## 2018-04-02 NOTE — Consult Note (Signed)
Neurology Consultation  Reason for Consult: Transient right leg weakness Referring Physician: Dr. Rex Kras  CC: Right leg weakness  History is obtained from: Patient, chart  HPI: Steven Guerrero is a 78 y.o. male past medical history of TBI, anxiety, hypertension, stroke, minor residual right-hip weakness from hip replacement, who was in his usual state of health last known normal 9 PM on 04/01/2018 when he was able to go to the bathroom and walk by himself and upon awakening this morning noticed that he cannot stand up or walk because of right leg weakness.  The wife is with him.  She reports no facial or arm involvement. His symptoms have since been improving and at the time of this encounter he was nearly 4+/5 in the lower extremity strength with no dysmetria. The patient has had some right-sided weakness at baseline due to hip issues.  Was never unable to walk and was use a walker to walk.  This was a drastic change from his prior weakness.  During his stay in the emergency room, when a neurological consultation was requested, and the exam done again, his weakness had started to improve. Denies any tingling or numbness.  Denies any chest pain shortness of breath.  Denies cough.  Denies fevers chills.  Denies abdominal pain nausea vomiting.  Denies bleeding or bruising tendencies. He had a TBI after falling from a ladder about 20 feet in 2005 with ICH. He has been seen and [facility] by [facility] neurology for sleep apnea as well as progressive gait disorder and neuropathy in both lower extremities.  LKW: 9 PM on 04/01/2018 tpa given?: no, outside the window Premorbid modified Rankin scale (mRS): 4  ROS: A 14 point ROS was performed and is negative except as noted in the HPI.   Past Medical History:  Diagnosis Date  . Anxiety and depression   . Back pain   . Brain bleed ([place])    2003  . CVA (cerebral infarction)    2001  . Hypercholesteremia   . Hypertension   . Peripheral neuropathy   .  Pulmonary embolism ([place])   . Sleep apnea   . Stroke ([place])    2001  . TBI (traumatic brain injury) ([place])    Family History  Problem Relation Age of Onset  . Heart attack Father        Mother - health hx unknown   social History:   reports that he quit smoking about 12 years ago. His smoking use included cigarettes. He has a 70.50 pack-year smoking history. He has never used smokeless tobacco. He reports current alcohol use. He reports that he does not use drugs.  Medications No current facility-administered medications for this encounter.   Current Outpatient Medications:  .  atorvastatin (LIPITOR) 40 MG tablet, Take 40 mg by mouth daily., Disp: , Rfl:  .  baclofen (LIORESAL) 10 MG tablet, TAKE 1 TABLET BY MOUTH AT  BEDTIME (Patient taking differently: Take 10 mg by mouth at bedtime as needed for muscle spasms. ), Disp: 90 tablet, Rfl: 1 .  citalopram (CELEXA) 40 MG tablet, Take 40 mg by mouth at bedtime., Disp: , Rfl:  .  fish oil-omega-3 fatty acids 1000 MG capsule, Take 1 g by mouth daily., Disp: , Rfl:  .  gabapentin (NEURONTIN) 100 MG capsule, Take 100 mg by mouth 2 (two) times daily. , Disp: , Rfl: 1 .  losartan-hydrochlorothiazide (HYZAAR) 100-25 MG per tablet, Take 1 tablet by mouth daily., Disp: , Rfl:  .  Multiple Vitamin (MULTIVITAMIN WITH MINERALS) TABS tablet, Take 1 tablet by mouth daily., Disp: , Rfl:  .  nitroGLYCERIN (NITRODUR - DOSED IN MG/24 HR) 0.2 mg/hr patch, APPLY 1/4 PATCH TO AFFECTED AREA DAILY. CHANGE PATCH DAILY AND SLIGHTLY ALTER LOCATION DAILY (Patient taking differently: Place 0.2 mg onto the skin daily as needed (pain). ), Disp: 30 patch, Rfl: 2 .  OVER THE COUNTER MEDICATION, Take 1 tablet by mouth daily. Vitamin for vision **areds**, Disp: , Rfl:  .  pantoprazole (PROTONIX) 40 MG tablet, Take 40 mg by mouth daily. , Disp: , Rfl:  .  traZODone (DESYREL) 50 MG tablet, Take 50 mg by mouth at bedtime., Disp: , Rfl:  .  vitamin B-12 (CYANOCOBALAMIN) 1000 MCG  tablet, Take 1,000 mcg by mouth daily., Disp: , Rfl:  .  warfarin (COUMADIN) 5 MG tablet, Take 5-7.5 mg by mouth See admin instructions. 5.5 mg 5 days/week 5mg  on Tuesday and Saturday, Disp: , Rfl:   Exam: Current vital signs: BP (!) 164/96   Pulse 82   Temp 97.7 F (36.5 C)   Resp 14   Ht 5' 10.5" (1.791 m)   Wt 90.7 kg   SpO2 96%   BMI 28.29 kg/m  Vital signs in last 24 hours: Temp:  [97.7 F (36.5 C)] 97.7 F (36.5 C) (01/10 1053) Pulse Rate:  [79-85] 82 (01/10 1415) Resp:  [13-23] 14 (01/10 1415) BP: (128-166)/(75-97) 164/96 (01/10 1415) SpO2:  [94 %-98 %] 96 % (01/10 1415) Weight:  [90.7 kg] 90.7 kg (01/10 1043) General: Awake alert in no distress HEENT: Normocephalic and atraumatic dry mucous membranes Lungs: Clear to auscultation Cardiovascular: S1-2 heard regular rate rhythm Abdomen: Nondistended nontender soft Extremities: Warm well perfused Neurological exam Awake alert oriented x3 Speech is mildly dysarthric but that has been his baseline since TBI 15 years ago. Naming, repetition, fluency intact. Comprehension is intact to simple and multistep commands. Cranial nerves: Pupils equal round react light, extraocular muscles intact, visual fields full, face symmetric, auditory acuity intact, palate elevates midline, shoulder shrug intact, tongue midline. Motor exam: Upper extremities 5/5 bilaterally with the exception of right upper extremity where the movement is restricted and limited by right shoulder pain which is baseline.  Lower extremities with no vertical drift.  Right lower extremity at the hip and knee are 4+/5.  Plantar flexion dorsiflexion is equal and symmetric 5/5 in both lower extremities. Sensory exam: Decreased in stocking-like pattern in both lower extremities up to the midcalf.  No focal difference between right or left. Coordination: Intact finger-nose-finger and heel-knee-shin. Gait testing was deferred at this time NIH stroke scale-1 for dysarthria  but that is baseline per family.  Labs I have reviewed labs in epic and the results pertinent to this consultation are:  CBC    Component Value Date/Time   WBC 10.4 04/02/2018 1127   RBC 4.41 04/02/2018 1127   HGB 13.7 04/02/2018 1127   HCT 42.0 04/02/2018 1127   PLT 249 04/02/2018 1127   MCV 95.2 04/02/2018 1127   MCH 31.1 04/02/2018 1127   MCHC 32.6 04/02/2018 1127   RDW 14.0 04/02/2018 1127   LYMPHSABS 0.6 (L) 04/02/2018 1127   MONOABS 0.9 04/02/2018 1127   EOSABS 0.0 04/02/2018 1127   BASOSABS 0.0 04/02/2018 1127    CMP     Component Value Date/Time   NA 139 04/02/2018 1127   K 4.1 04/02/2018 1127   CL 106 04/02/2018 1127   CO2 23 04/02/2018 1127   GLUCOSE 98  04/02/2018 1127   BUN 18 04/02/2018 1127   CREATININE 1.53 (H) 04/02/2018 1127   CALCIUM 9.6 04/02/2018 1127   PROT 7.4 04/02/2018 1127   ALBUMIN 3.9 04/02/2018 1127   AST 31 04/02/2018 1127   ALT 23 04/02/2018 1127   ALKPHOS 71 04/02/2018 1127   BILITOT 3.1 (H) 04/02/2018 1127   GFRNONAA 43 (L) 04/02/2018 1127   GFRAA 50 (L) 04/02/2018 1127   Imaging I have reviewed the images obtained: CT-scan of the brain-no acute changes.  Extensive chronic white matter disease as well as prior infarcts.   Assessment:  79 year old man with a history of TBI, strokes in the past, sleep apnea, progressive gait disturbance of unknown etiology and bilateral peripheral neuropathy presenting for sudden onset of inability to move his right leg. His strength is since he recovered.  Last known normal 9 PM on 04/01/2018. Outside the window for TPA. No evidence of cortical signs on exam hence not a candidate for EVT. Symptoms likely consistent with TIA in the left ACA territory versus a fluctuating lacunar stroke. Other differentials to consider local right hip versus lumbosacral radiculopathy etiology but that would not cause sudden onset weakness.   Impression: Evaluate for TIA  Recommendations: -Admit to  observation -Telemetry monitoring -Allow for permissive hypertension for the first 24-48h - only treat PRN if SBP >220 mmHg. Blood pressures can be gradually normalized to SBP<140 upon discharge. -MRI brain without contrast -CT Angiogram of Head and neck -Echocardiogram -HgbA1c, fasting lipid panel -Frequent neuro checks -Prophylactic therapy-Antiplatelet med: Aspirin - dose 325mg  PO or 300mg  PR -Atorvastatin 80 mg PO daily -Risk factor modification -I discussed the importance of exercise as well as smoking/alcohol/illicit drug use cessation. -PT consult, OT consult, Speech consult  Please page stroke NP/PA/MD (listed on AMION)  from 8am-4 pm as this patient will be followed by the stroke team at this point.  -- Amie Portland, MD [facility] Pager: [phone] If 7pm to 7am, please call on call as listed on AMION.

## 2018-04-02 NOTE — ED Triage Notes (Signed)
Pt here via [facility] from home for inability to use R leg.  LSN last night at 9 am.  Woke up at 3 am and could not use R foot.  Still was unable to use foot this am, so called ambulance. TBI hx with slurred speech.  Pt uses walker.

## 2018-04-03 ENCOUNTER — Observation Stay (HOSPITAL_BASED_OUTPATIENT_CLINIC_OR_DEPARTMENT_OTHER): Payer: Medicare Other

## 2018-04-03 DIAGNOSIS — M79609 Pain in unspecified limb: Secondary | ICD-10-CM

## 2018-04-03 DIAGNOSIS — Z96641 Presence of right artificial hip joint: Secondary | ICD-10-CM | POA: Diagnosis not present

## 2018-04-03 DIAGNOSIS — Z8673 Personal history of transient ischemic attack (TIA), and cerebral infarction without residual deficits: Secondary | ICD-10-CM

## 2018-04-03 DIAGNOSIS — M79605 Pain in left leg: Secondary | ICD-10-CM

## 2018-04-03 DIAGNOSIS — G459 Transient cerebral ischemic attack, unspecified: Secondary | ICD-10-CM | POA: Diagnosis not present

## 2018-04-03 DIAGNOSIS — Z86711 Personal history of pulmonary embolism: Secondary | ICD-10-CM

## 2018-04-03 DIAGNOSIS — I10 Essential (primary) hypertension: Secondary | ICD-10-CM | POA: Diagnosis not present

## 2018-04-03 DIAGNOSIS — M79604 Pain in right leg: Secondary | ICD-10-CM

## 2018-04-03 DIAGNOSIS — R791 Abnormal coagulation profile: Secondary | ICD-10-CM

## 2018-04-03 LAB — LIPID PANEL
Cholesterol: 78 mg/dL (ref 0–200)
HDL: 45 mg/dL (ref >40)
LDL CALC: 21 mg/dL (ref 0–99)
Total CHOL/HDL Ratio: 1.7 RATIO
Triglycerides: 62 mg/dL (ref <150)
VLDL: 12 mg/dL (ref 0–40)

## 2018-04-03 LAB — BASIC METABOLIC PANEL
ANION GAP: 13 (ref 5–15)
BUN: 20 mg/dL (ref 8–23)
CO2: 20 mmol/L — ABNORMAL LOW (ref 22–32)
Calcium: 9.1 mg/dL (ref 8.9–10.3)
Chloride: 108 mmol/L (ref 98–111)
Creatinine, Ser: 1.46 mg/dL — ABNORMAL HIGH (ref 0.61–1.24)
GFR calc non Af Amer: 45 mL/min — ABNORMAL LOW (ref >60)
GFR, EST AFRICAN AMERICAN: 53 mL/min — AB (ref >60)
Glucose, Bld: 103 mg/dL — ABNORMAL HIGH (ref 70–99)
Potassium: 3.4 mmol/L — ABNORMAL LOW (ref 3.5–5.1)
Sodium: 141 mmol/L (ref 135–145)

## 2018-04-03 LAB — PROTIME-INR
INR: 1.51
Prothrombin Time: 18 seconds — ABNORMAL HIGH (ref 11.4–15.2)

## 2018-04-03 LAB — CK: Total CK: 246 U/L (ref 49–397)

## 2018-04-03 MED ORDER — LOSARTAN POTASSIUM 50 MG PO TABS
100.0000 mg | ORAL_TABLET | Freq: Every day | ORAL | Status: DC
  Administered 2018-04-03 – 2018-04-04 (×2): 100 mg via ORAL
  Filled 2018-04-03 (×2): qty 2

## 2018-04-03 MED ORDER — WARFARIN SODIUM 5 MG PO TABS
10.0000 mg | ORAL_TABLET | Freq: Once | ORAL | Status: DC
  Filled 2018-04-03: qty 2

## 2018-04-03 MED ORDER — ADULT MULTIVITAMIN W/MINERALS CH
1.0000 | ORAL_TABLET | Freq: Every day | ORAL | Status: DC
  Administered 2018-04-03 – 2018-04-05 (×3): 1 via ORAL
  Filled 2018-04-03 (×3): qty 1

## 2018-04-03 MED ORDER — HYDROCHLOROTHIAZIDE 25 MG PO TABS
25.0000 mg | ORAL_TABLET | Freq: Every day | ORAL | Status: DC
  Administered 2018-04-03 – 2018-04-04 (×2): 25 mg via ORAL
  Filled 2018-04-03 (×2): qty 1

## 2018-04-03 MED ORDER — LOSARTAN POTASSIUM-HCTZ 100-25 MG PO TABS: 1.0000 | ORAL_TABLET | Freq: Every day | ORAL | Status: DC

## 2018-04-03 MED ORDER — RIVAROXABAN 20 MG PO TABS
20.0000 mg | ORAL_TABLET | Freq: Every day | ORAL | Status: DC
  Administered 2018-04-03 – 2018-04-04 (×2): 20 mg via ORAL
  Filled 2018-04-03 (×2): qty 1

## 2018-04-03 NOTE — Progress Notes (Signed)
ANTICOAGULATION CONSULT NOTE - Follow-Up  Pharmacy Consult for warfarin Indication: Hx pulmonary embolus/CVA  Patient Measurements: Height: 5' 10.5" (179.1 cm) Weight: 189 lb 13.1 oz (86.1 kg) IBW/kg (Calculated) : 74.15  Vital Signs: Temp: 98.6 F (37 C) (01/11 0726) Temp Source: Oral (01/11 0726) BP: 153/78 (01/11 0726) Pulse Rate: 74 (01/11 0726)  Labs: Recent Labs    04/02/18 1127 04/03/18 0539  HGB 13.7  --   HCT 42.0  --   PLT 249  --   APTT 31  --   LABPROT 18.6* 18.0*  INR 1.57 1.51  CREATININE 1.53* 1.46*    Estimated Creatinine Clearance: 43.8 mL/min (A) (by C-G formula based on SCr of 1.46 mg/dL (H)).  Assessment: 63 YOM who presented on 1/10 with R-leg weakness concerning for CVA however MRI negative for acute infarct. The patient was on warfarin for anticoagulation PTA for hx PE/prior CVA. Admit INR 1.57 on PTA dose of 7.5 mg daily EXCEPT for 5 mg on Tues/Sat. Pharmacy consulted to resume dosing this admit.    INR today remains SUBtherapeutic (INR 1.51 << 1.57, goal of 2-3). CBC wnl on 1/10 - no bleeding noted at this time. The patient is also on lovenox 40 mg SQ daily for VTE prophylaxis while INR<2.   Goal of Therapy:  INR 2-3 Monitor platelets by anticoagulation protocol: Yes   Plan:  - Warfarin 10 mg x 1 dose at 1800 today - Will continue to monitor for any signs/symptoms of bleeding and will follow up with PT/INR in the a.m.  Thank you for allowing pharmacy to be a part of this patient's care.  Alycia Rossetti, PharmD, BCPS Clinical Pharmacist Clinical phone for 04/03/2018: [phone] 04/03/2018 10:54 AM   **Pharmacist phone directory can now be found on [email or web address] (PW [email or web address]).  Listed under [place].

## 2018-04-03 NOTE — Progress Notes (Addendum)
STROKE TEAM PROGRESS NOTE   SUBJECTIVE (INTERVAL HISTORY) No family is at the bedside.  Pt lying in bed. Stated that 3 days ago, he was not able to get up from recliner to go to bathroom which was not usual for him. He had TBI 15 years ago with residue right sided weakness but able to walk with walker. Currently, his condition improved and he was able to walk with PT/OT yesterday in room. Today he still felt he was weaker than his baseline, not able to walk by himself. MRI negative for stroke.   On examination, he was found to have extreme pain on bilateral thigh muscles with palpitation of b/l thigh. No urinary retention, no arm or speech changes from baseline.    OBJECTIVE Vitals:   04/02/18 2231 04/03/18 0112 04/03/18 0341 04/03/18 0726  BP: (!) 155/82 (!) 142/75 130/69 (!) 153/78  Pulse: 84 68 70 74  Resp: 18 18 18 18   Temp:   98.4 F (36.9 C) 98.6 F (37 C)  TempSrc:   Oral Oral  SpO2: 93%  93% 93%  Weight:   86.1 kg   Height:        CBC:  Recent Labs  Lab 04/02/18 1127  WBC 10.4  NEUTROABS 8.8*  HGB 13.7  HCT 42.0  MCV 95.2  PLT 017    Basic Metabolic Panel:  Recent Labs  Lab 04/02/18 1127 04/03/18 0539  NA 139 141  K 4.1 3.4*  CL 106 108  CO2 23 20*  GLUCOSE 98 103*  BUN 18 20  CREATININE 1.53* 1.46*  CALCIUM 9.6 9.1    Lipid Panel:     Component Value Date/Time   CHOL 78 04/03/2018 0539   TRIG 62 04/03/2018 0539   HDL 45 04/03/2018 0539   CHOLHDL 1.7 04/03/2018 0539   VLDL 12 04/03/2018 0539   LDLCALC 21 04/03/2018 0539   HgbA1c:  Lab Results  Component Value Date   HGBA1C 5.8 (H) 04/02/2018   Urine Drug Screen: No results found for: LABOPIA, COCAINSCRNUR, LABBENZ, AMPHETMU, THCU, LABBARB  Alcohol Level No results found for: ETH  IMAGING   Ct Angio Head W Or Wo Contrast  Result Date: 04/02/2018 CLINICAL DATA:  TIA, initial exam. Personal history of CVA and traumatic brain injury. New onset of right lower extremity weakness. Patient  states normal strength at 9 p.m. last evening. Weakness noticed when he woke up at 3 a.m. EXAM: CT ANGIOGRAPHY HEAD AND NECK TECHNIQUE: Multidetector CT imaging of the head and neck was performed using the standard protocol during bolus administration of intravenous contrast. Multiplanar CT image reconstructions and MIPs were obtained to evaluate the vascular anatomy. Carotid stenosis measurements (when applicable) are obtained utilizing NASCET criteria, using the distal internal carotid diameter as the denominator. CONTRAST:  194mL ISOVUE-370 IOPAMIDOL (ISOVUE-370) INJECTION 76% COMPARISON:  MRI brain 04/02/2018 CT head without contrast 04/02/2018 FINDINGS: CTA NECK FINDINGS Aortic arch: There is a common origin of the left common carotid artery and the innominate artery. Atherosclerotic changes are present at the aortic arch. There is no aneurysm. No significant stenosis is present at the origins of the great vessels. Right carotid system: Right common carotid artery is within normal limits proximally. Distal atherosclerotic changes are present. Atherosclerotic changes are present through the carotid bifurcation without a significant stenosis. The distal cervical right ICA is normal. Left carotid system: Atherosclerotic changes are present in the proximal and distal left common carotid artery without a significant stenosis. There is mixed calcified and  soft tissue plaque in the proximal left internal carotid artery. Minimal luminal diameter is 3 mm without a significant stenosis relative to the more distal vessels. Distal cervical left ICA is normal. Vertebral arteries: Atherosclerotic calcifications are present at the origins of the vertebral arteries bilaterally. Mild narrowing is present bilaterally. The left vertebral artery is slightly dominant. There are no other significant stenoses of either vertebral artery in the neck. Skeleton: Slight retrolisthesis is present at C3-4. Multilevel degenerative changes  are present throughout cervical spine. There is chronic loss of disc height C4-5, C5-6, and C6-7. No focal lytic or blastic lesions are present. Other neck: No significant coastal or submucosal lesions are present. Salivary glands are within normal limits. There is no significant adenopathy. Vocal cords are midline and symmetric. Trachea is within normal limits. Upper chest: Centrilobular emphysematous changes are present at the lung apices bilaterally. There is no nodule or mass lesion. Thoracic at inlet is within normal limits. The upper mediastinum is within normal limits. Review of the MIP images confirms the above findings CTA HEAD FINDINGS Anterior circulation: Atherosclerotic calcifications are present within the cavernous internal carotid artery on the right without a significant luminal stenosis relative to the more distal vessel. Precavernous and cavernous calcifications are present on the left. The lumen is narrowed to 1.5 mm in the proximal left cavernous ICA. This compares with 3.6 mm more distally. No other significant stenosis is present. The right A1 segment is dominant. The anterior communicating artery is patent. M1 segments are normal bilaterally. Bifurcations are intact. Left MCA branch vessels are slightly less robust than on the right. No significant focal stenosis or occlusion is present. Posterior circulation: The left vertebral artery is the dominant vessel. Atherosclerotic calcifications are present at the dural margin without significant stenosis bilaterally. PICA origins are visualized and normal. The vertebrobasilar junction is normal. Basilar artery is within normal limits. Both posterior cerebral arteries originate from the basilar tip. There is mild narrowing of the proximal left PCA relative to the more distal vessels. Distal branch vessel narrowing is present bilaterally. Venous sinuses: The dural sinuses are patent. Straight sinus deep cerebral veins are intact. Cortical veins are  within normal limits. Anatomic variants: None Delayed phase: The postcontrast images demonstrate remote lacunar infarcts of the basal ganglia bilaterally. Diffuse white matter disease is present. No pathologic enhancement is present. Review of the MIP images confirms the above findings IMPRESSION: 1. 60% stenosis of the proximal cavernous left internal carotid artery relative to the more distal vessels. 2. Slight decreased caliber of left MCA branch vessels compared to the right. This may reflect decreased perfusion due to the proximal stenosis. 3. No emergent large vessel occlusion. 4. Extensive atherosclerotic changes as described without other focal stenosis. 5. Mild diffuse small vessel disease present within the intracranial circulation. 6. Degenerative changes of the cervical spine. 7.  Emphysema (ICD10-J43.9). Electronically Signed   By: San Morelle M.D.   On: 04/02/2018 20:25   Ct Head Wo Contrast  Result Date: 04/02/2018 CLINICAL DATA:  Right leg weakness for several hours EXAM: CT HEAD WITHOUT CONTRAST TECHNIQUE: Contiguous axial images were obtained from the base of the skull through the vertex without intravenous contrast. COMPARISON:  10/01/2016 FINDINGS: Brain: Diffuse atrophic changes and chronic white matter ischemic changes are again noted and stable. Left thalamic lacunar infarct is noted. No findings to suggest acute hemorrhage, acute infarction or space-occupying mass lesion is noted. Vascular: No hyperdense vessel or unexpected calcification. Skull: Normal. Negative for fracture or focal  lesion. Sinuses/Orbits: No acute finding. Other: None. IMPRESSION: Chronic atrophic and ischemic changes stable from the prior exam. No acute abnormality noted. Electronically Signed   By: Inez Catalina M.D.   On: 04/02/2018 12:32   Ct Angio Neck W Or Wo Contrast  Result Date: 04/02/2018 CLINICAL DATA:  TIA, initial exam. Personal history of CVA and traumatic brain injury. New onset of right  lower extremity weakness. Patient states normal strength at 9 p.m. last evening. Weakness noticed when he woke up at 3 a.m. EXAM: CT ANGIOGRAPHY HEAD AND NECK TECHNIQUE: Multidetector CT imaging of the head and neck was performed using the standard protocol during bolus administration of intravenous contrast. Multiplanar CT image reconstructions and MIPs were obtained to evaluate the vascular anatomy. Carotid stenosis measurements (when applicable) are obtained utilizing NASCET criteria, using the distal internal carotid diameter as the denominator. CONTRAST:  160mL ISOVUE-370 IOPAMIDOL (ISOVUE-370) INJECTION 76% COMPARISON:  MRI brain 04/02/2018 CT head without contrast 04/02/2018 FINDINGS: CTA NECK FINDINGS Aortic arch: There is a common origin of the left common carotid artery and the innominate artery. Atherosclerotic changes are present at the aortic arch. There is no aneurysm. No significant stenosis is present at the origins of the great vessels. Right carotid system: Right common carotid artery is within normal limits proximally. Distal atherosclerotic changes are present. Atherosclerotic changes are present through the carotid bifurcation without a significant stenosis. The distal cervical right ICA is normal. Left carotid system: Atherosclerotic changes are present in the proximal and distal left common carotid artery without a significant stenosis. There is mixed calcified and soft tissue plaque in the proximal left internal carotid artery. Minimal luminal diameter is 3 mm without a significant stenosis relative to the more distal vessels. Distal cervical left ICA is normal. Vertebral arteries: Atherosclerotic calcifications are present at the origins of the vertebral arteries bilaterally. Mild narrowing is present bilaterally. The left vertebral artery is slightly dominant. There are no other significant stenoses of either vertebral artery in the neck. Skeleton: Slight retrolisthesis is present at C3-4.  Multilevel degenerative changes are present throughout cervical spine. There is chronic loss of disc height C4-5, C5-6, and C6-7. No focal lytic or blastic lesions are present. Other neck: No significant coastal or submucosal lesions are present. Salivary glands are within normal limits. There is no significant adenopathy. Vocal cords are midline and symmetric. Trachea is within normal limits. Upper chest: Centrilobular emphysematous changes are present at the lung apices bilaterally. There is no nodule or mass lesion. Thoracic at inlet is within normal limits. The upper mediastinum is within normal limits. Review of the MIP images confirms the above findings CTA HEAD FINDINGS Anterior circulation: Atherosclerotic calcifications are present within the cavernous internal carotid artery on the right without a significant luminal stenosis relative to the more distal vessel. Precavernous and cavernous calcifications are present on the left. The lumen is narrowed to 1.5 mm in the proximal left cavernous ICA. This compares with 3.6 mm more distally. No other significant stenosis is present. The right A1 segment is dominant. The anterior communicating artery is patent. M1 segments are normal bilaterally. Bifurcations are intact. Left MCA branch vessels are slightly less robust than on the right. No significant focal stenosis or occlusion is present. Posterior circulation: The left vertebral artery is the dominant vessel. Atherosclerotic calcifications are present at the dural margin without significant stenosis bilaterally. PICA origins are visualized and normal. The vertebrobasilar junction is normal. Basilar artery is within normal limits. Both posterior cerebral arteries originate  from the basilar tip. There is mild narrowing of the proximal left PCA relative to the more distal vessels. Distal branch vessel narrowing is present bilaterally. Venous sinuses: The dural sinuses are patent. Straight sinus deep cerebral veins  are intact. Cortical veins are within normal limits. Anatomic variants: None Delayed phase: The postcontrast images demonstrate remote lacunar infarcts of the basal ganglia bilaterally. Diffuse white matter disease is present. No pathologic enhancement is present. Review of the MIP images confirms the above findings IMPRESSION: 1. 60% stenosis of the proximal cavernous left internal carotid artery relative to the more distal vessels. 2. Slight decreased caliber of left MCA branch vessels compared to the right. This may reflect decreased perfusion due to the proximal stenosis. 3. No emergent large vessel occlusion. 4. Extensive atherosclerotic changes as described without other focal stenosis. 5. Mild diffuse small vessel disease present within the intracranial circulation. 6. Degenerative changes of the cervical spine. 7.  Emphysema (ICD10-J43.9). Electronically Signed   By: San Morelle M.D.   On: 04/02/2018 20:25   Mr Brain Wo Contrast  Result Date: 04/02/2018 CLINICAL DATA:  Right leg weakness. EXAM: MRI HEAD WITHOUT CONTRAST TECHNIQUE: Multiplanar, multiecho pulse sequences of the brain and surrounding structures were obtained without intravenous contrast. COMPARISON:  CT head 04/02/2018 FINDINGS: Brain: Negative for acute infarct. Moderate to advanced cortical atrophy. Extensive chronic ischemic change throughout the white matter and basal ganglia bilaterally. Chronic ischemia in the pons. Negative for mass lesion. Chronic hemorrhage right external capsule. No midline shift. Negative for hydrocephalus. Vascular: Normal arterial flow voids Skull and upper cervical spine: Negative Sinuses/Orbits: Bilateral cataract surgery.  Paranasal sinuses clear Other: None IMPRESSION: Negative for acute infarct Moderate to advanced atrophy with advanced chronic microvascular ischemia. Electronically Signed   By: Franchot Gallo M.D.   On: 04/02/2018 16:03     Transthoracic Echocardiogram  - Procedure  narrative: Transthoracic echocardiography. Image   quality was poor. The study was technically difficult. - Left ventricle: The cavity size was normal. Systolic function was   normal. The estimated ejection fraction was in the range of 55%   to 60%. Although no diagnostic regional wall motion abnormality   was identified, this possibility cannot be completely excluded on   the basis of this study. Doppler parameters are consistent with   abnormal left ventricular relaxation (grade 1 diastolic   dysfunction). - Aortic valve: There was trivial regurgitation.   PHYSICAL EXAM  Temp:  [97.4 F (36.3 C)-98.6 F (37 C)] 98.5 F (36.9 C) (01/11 1134) Pulse Rate:  [68-90] 68 (01/11 1134) Resp:  [13-23] 18 (01/11 1134) BP: (128-178)/(69-97) 155/75 (01/11 1134) SpO2:  [93 %-96 %] 94 % (01/11 1134) Weight:  [86.1 kg] 86.1 kg (01/11 0341)  General - Well nourished, well developed, in no apparent distress.  Ophthalmologic - fundi not visualized due to noncooperation.  Cardiovascular - Regular rate and rhythm.  Mental Status -  Level of arousal and orientation to time and person were intact, but not orientated to time. Language including expression, naming, repetition, comprehension was assessed and found intact, but severe dysarthria.  Cranial Nerves II - XII - II - Visual field intact OU. III, IV, VI - Extraocular movements intact. V - Facial sensation intact bilaterally. VII - right facial droop. VIII - Hearing & vestibular intact bilaterally. X - Palate elevates symmetrically, severe dysarthria. XI - Chin turning & shoulder shrug intact bilaterally. XII - Tongue protrusion intact.  Motor Strength - The patient's strength was 4+/5 BUEs but with  right should limited ROM due to arthritis, LLE 2+/5 proximal and 5/5 distal and RLE 2/5 proximal and 4/5 distally.  Decreased BLE movement largely due to severe pain on b/l thigh movement and palpitation  Reflexes - The patient's reflexes  were 1+ in all extremities and he had no pathological reflexes.  Sensory - Light touch, temperature/pinprick were assessed and were symmetrical.    Coordination - The patient had normal movements in the hands with no ataxia or dysmetria.  Tremor was absent.  Gait and Station - deferred.   ASSESSMENT/PLAN [name] is a 78 y.o. male with history of LBP, gait disorder, OSA, TBI in 2005 with ICH and residue right leg weakness, right hip s/p replacement, ? Hx of PT on coumadin, PVD presenting with BLE weakness. He did not receive IV t-PA due to outside window.   BLE muscle pain, unlikely cerebrovascular event  Pt complains of BLE weakness 3 days ago, not on one side  B/l thigh severe pain on palpation   Muscle pain likely the cause of BLE weakness  Given hx of PE, will do LE venous doppler to rule out DVT or hematoma  No sign of fracture or dislocation  No evidence of spinal cord compression, as no hyperreflexia or bowel bladder difficulty  CK level pending  Continue PT/OT  Hx of PE  Pt not quit sure about PE  Documented on chart by H&P  On coumadin but INR 1.57->1.51  If pt needs long term anticoagulation, please consider to switch coumadin to DOAC such as Xarelto or Eliquis if LE venous doppler showed no hematoma  Once DOAC starts or INR 2-3, ASA and lovenox prophylaxis can be discontinued.   Hypertension  Stable on the high end . Resume home hyzaar  . Long-term BP goal normotensive  Other Stroke Risk Factors  Advanced age  Former Cigarette smoker  Hx stroke/TIA  OVD  OSA  Other Active Problems  LBP and gait disorder following with Dr. Krista Blue at [facility]  S/p right hip replacement   Hospital day # 0  Rosalin Hawking, MD PhD Stroke Neurology 04/03/2018 12:43 PM   To contact Stroke Continuity provider, please refer to [email or web address] After hours, contact General Neurology

## 2018-04-03 NOTE — Progress Notes (Signed)
ADDENDUM:  LE venous Doppler no DVT or hematoma, CK normal.  Bilateral thigh pain likely due to muscle pain.  INR subtherapeutic on admission, patient stated that his INR up and down PTA, difficult to control.  Recommend to switch Coumadin to DOAC. Will start Xarelto today. Will d/c ASA.  PT/OT recommend home health PT/OT.  Neurology will sign off. Please call with questions.  No neuro follow-up needed at this time. Thanks for the consult.  Rosalin Hawking, MD PhD Stroke Neurology 04/03/2018 5:10 PM

## 2018-04-03 NOTE — Evaluation (Addendum)
Occupational Therapy Evaluation Patient Details Name: Steven Guerrero MRN: [id number] DOB: 10-17-1940 Today's Date: 04/03/2018    History of Present Illness Steven Guerrero is a 78 y.o. male with medical history significant for TIA, traumatic brain injury, right hip arthropathy, history of pulmonary embolism on long-term Coumadin therapy comes to the emergency room with right leg weakness which is already improving on the way to ER.  Pt's right LE continues with pain and weakness.   Clinical Impression   PTA patient independent with ADLs and mobility using RW.  Currently admitted for above and limited by problem list below, including B thigh pain, impaired balance, and decreased activity tolerance.  Requires setup assist for UB ADLs, min to mod assist for LB ADLs (as he usually uses sock aide), min assist for transfers and min assist for short distance mobility using RW.  Noted mobility requiring cueing for safety and sequencing, taking very short steps with L LE and 'dragging' it behind him. Patient will benefit from continued OT services while admitted and after dc at [facility] level in order to optimize independence and safety with ADLs/mobility.   Follow Up Recommendations  Home health OT;Supervision/Assistance - 24 hour    Equipment Recommendations  None recommended by OT    Recommendations for Other Services       Precautions / Restrictions Precautions Precautions: Fall Restrictions Weight Bearing Restrictions: No      Mobility Bed Mobility Overal bed mobility: Needs Assistance Bed Mobility: Supine to Sit     Supine to sit: Min assist;HOB elevated     General bed mobility comments: requires support to ascend trunk to EOB, increased time and effort  Transfers Overall transfer level: Needs assistance Equipment used: Rolling walker (2 wheeled) Transfers: Sit to/from Stand Sit to Stand: Min assist         General transfer comment: min assist to power up into standing at  elevated bed with cueing for hand placement    Balance Overall balance assessment: Needs assistance Sitting-balance support: No upper extremity supported Sitting balance-Leahy Scale: Fair     Standing balance support: Bilateral upper extremity supported;During functional activity Standing balance-Leahy Scale: Poor Standing balance comment: reliant on B UE support                            ADL either performed or assessed with clinical judgement   ADL Overall ADL's : Needs assistance/impaired     Grooming: Set up;Sitting;Wash/dry hands;Wash/dry face;Brushing hair   Upper Body Bathing: Set up;Sitting   Lower Body Bathing: Moderate assistance;Sit to/from stand   Upper Body Dressing : Set up;Sitting   Lower Body Dressing: Moderate assistance;Sit to/from stand   Toilet Transfer: Minimal assistance Armed forces technical officer Details (indicate cue type and reason): cueing for hand placement and safety, simulated to recliner          Functional mobility during ADLs: Minimal assistance;Rolling walker;Cueing for safety;Cueing for sequencing General ADL Comments: Patient requires increased time for all tasks, limited by pain in B thighs today. During functional mobility to recliner, noted patient dragging L LE and taking very small steps     Vision   Vision Assessment?: No apparent visual deficits     Perception     Praxis      Pertinent Vitals/Pain Pain Assessment: Faces Faces Pain Scale: Hurts even more Pain Location: B anterior thighs Pain Descriptors / Indicators: Discomfort;Grimacing;Sore Pain Intervention(s): Limited activity within patient's tolerance;Repositioned;Monitored during session  Hand Dominance Right   Extremity/Trunk Assessment Upper Extremity Assessment Upper Extremity Assessment: Overall WFL for tasks assessed(hx of R sided weakness from TBI)   Lower Extremity Assessment Lower Extremity Assessment: Defer to PT evaluation        Communication Communication Communication: Other (comment)(dysarthric, but intelligible )   Cognition Arousal/Alertness: Awake/alert Behavior During Therapy: WFL for tasks assessed/performed Overall Cognitive Status: History of cognitive impairments - at baseline                                 General Comments: ho of TBI   General Comments       Exercises     Shoulder Instructions      Home Living Family/patient expects to be discharged to:: Private residence Living Arrangements: Spouse/significant other Available Help at Discharge: Family;Available PRN/intermittently(up to 24 hours ) Type of Home: House Home Access: Stairs to enter [employer] of Steps: 1   Home Layout: One level     Bathroom Shower/Tub: Occupational psychologist: Handicapped height     Home Equipment: Environmental consultant - 2 wheels;Walker - 4 wheels;Shower seat;Grab bars - tub/shower;Grab bars - toilet;Bedside commode;Adaptive equipment Adaptive Equipment: Sock aid        Prior Functioning/Environment Level of Independence: Independent with assistive device(s)        Comments: independent with RW, independent ADLs, drives, spouse completes IADLs, med mgmt, finances         OT Problem List: Decreased strength;Decreased activity tolerance;Impaired balance (sitting and/or standing);Decreased safety awareness;Decreased cognition;Decreased knowledge of use of DME or AE;Decreased knowledge of precautions;Pain      OT Treatment/Interventions: Self-care/ADL training;Therapeutic exercise;DME and/or AE instruction;Therapeutic activities;Patient/family education;Balance training    OT Goals(Current goals can be found in the care plan section) Acute Rehab OT Goals Patient Stated Goal: to have less pain  OT Goal Formulation: With patient Time For Goal Achievement: 04/17/18 Potential to Achieve Goals: Good  OT Frequency: Min 2X/week   Barriers to D/C:             Co-evaluation              AM-PAC OT "6 Clicks" Daily Activity     Outcome Measure Help from another person eating meals?: None Help from another person taking care of personal grooming?: A Little Help from another person toileting, which includes using toliet, bedpan, or urinal?: A Little Help from another person bathing (including washing, rinsing, drying)?: A Little Help from another person to put on and taking off regular upper body clothing?: None Help from another person to put on and taking off regular lower body clothing?: A Little 6 Click Score: 20   End of Session Equipment Utilized During Treatment: Rolling walker;Gait belt Nurse Communication: Mobility status  Activity Tolerance: Patient limited by pain Patient left: in chair;with call bell/phone within reach;with chair alarm set  OT Visit Diagnosis: Other abnormalities of gait and mobility (R26.89);Muscle weakness (generalized) (M62.81);Pain Pain - Right/Left: (bil) Pain - part of body: Leg                Time: 3016-0109 OT Time Calculation (min): 22 min Charges:  OT General Charges $OT Visit: 1 Visit OT Evaluation $OT Eval Moderate Complexity: Fisher, OT Acute Rehabilitation Services Pager [phone] Office [phone]   Delight Stare 04/03/2018, 1:35 PM

## 2018-04-03 NOTE — Progress Notes (Signed)
VASCULAR LAB PRELIMINARY  PRELIMINARY  PRELIMINARY  PRELIMINARY  Bilateral lower extremity venous duplex completed.    Preliminary report:  There is no DVT or SVT noted in the bilateral lower extremities.   Joylyn Duggin, RVT 04/03/2018, 3:04 PM

## 2018-04-03 NOTE — Progress Notes (Addendum)
ANTICOAGULATION CONSULT NOTE - Follow-Up  Pharmacy Consult for xarelto Indication: Hx pulmonary embolus/CVA  Patient Measurements: Height: 5' 10.5" (179.1 cm) Weight: 189 lb 13.1 oz (86.1 kg) IBW/kg (Calculated) : 74.15  Vital Signs: Temp: 98.5 F (36.9 C) (01/11 1134) Temp Source: Oral (01/11 1134) BP: 155/75 (01/11 1134) Pulse Rate: 68 (01/11 1134)  Labs: Recent Labs    04/02/18 1127 04/03/18 0539  HGB 13.7  --   HCT 42.0  --   PLT 249  --   APTT 31  --   LABPROT 18.6* 18.0*  INR 1.57 1.51  CREATININE 1.53* 1.46*  CKTOTAL  --  246    Estimated Creatinine Clearance: 43.8 mL/min (A) (by C-G formula based on SCr of 1.46 mg/dL (H)).  Assessment: 46 YOM who presented on 1/10 with R-leg weakness concerning for CVA however MRI negative for acute infarct. The patient was on warfarin for anticoagulation PTA however, it has been difficult to control. Will switch patient to xarelto.   Goal of Therapy:  Monitor platelets by anticoagulation protocol: Yes   Plan:  Xarelto 20mg  daily F/u renl fxn, S&S of bleeding DC lovenox  Salome Arnt, PharmD, BCPS Please see AMION for all pharmacy numbers 04/03/2018 5:19 PM

## 2018-04-04 DIAGNOSIS — Z86711 Personal history of pulmonary embolism: Secondary | ICD-10-CM | POA: Diagnosis not present

## 2018-04-04 DIAGNOSIS — I10 Essential (primary) hypertension: Secondary | ICD-10-CM | POA: Diagnosis not present

## 2018-04-04 DIAGNOSIS — M79604 Pain in right leg: Secondary | ICD-10-CM | POA: Diagnosis not present

## 2018-04-04 DIAGNOSIS — Z8673 Personal history of transient ischemic attack (TIA), and cerebral infarction without residual deficits: Secondary | ICD-10-CM | POA: Diagnosis not present

## 2018-04-04 DIAGNOSIS — Z7901 Long term (current) use of anticoagulants: Secondary | ICD-10-CM

## 2018-04-04 DIAGNOSIS — M79605 Pain in left leg: Secondary | ICD-10-CM | POA: Diagnosis present

## 2018-04-04 DIAGNOSIS — Z96641 Presence of right artificial hip joint: Secondary | ICD-10-CM | POA: Diagnosis not present

## 2018-04-04 LAB — CBC
HCT: 38.1 % — ABNORMAL LOW (ref 39.0–52.0)
Hemoglobin: 12.4 g/dL — ABNORMAL LOW (ref 13.0–17.0)
MCH: 30.7 pg (ref 26.0–34.0)
MCHC: 32.5 g/dL (ref 30.0–36.0)
MCV: 94.3 fL (ref 80.0–100.0)
Platelets: 222 10*3/uL (ref 150–400)
RBC: 4.04 MIL/uL — ABNORMAL LOW (ref 4.22–5.81)
RDW: 14.1 % (ref 11.5–15.5)
WBC: 10.3 10*3/uL (ref 4.0–10.5)
nRBC: 0 % (ref 0.0–0.2)

## 2018-04-04 LAB — BASIC METABOLIC PANEL
Anion gap: 9 (ref 5–15)
BUN: 23 mg/dL (ref 8–23)
CO2: 25 mmol/L (ref 22–32)
CREATININE: 1.71 mg/dL — AB (ref 0.61–1.24)
Calcium: 9 mg/dL (ref 8.9–10.3)
Chloride: 104 mmol/L (ref 98–111)
GFR calc non Af Amer: 38 mL/min — ABNORMAL LOW (ref >60)
GFR, EST AFRICAN AMERICAN: 43 mL/min — AB (ref >60)
Glucose, Bld: 105 mg/dL — ABNORMAL HIGH (ref 70–99)
Potassium: 3.2 mmol/L — ABNORMAL LOW (ref 3.5–5.1)
SODIUM: 138 mmol/L (ref 135–145)

## 2018-04-04 LAB — PROTIME-INR
INR: 3.56
Prothrombin Time: 35.1 seconds — ABNORMAL HIGH (ref 11.4–15.2)

## 2018-04-04 LAB — SEDIMENTATION RATE: Sed Rate: 16 mm/hr (ref 0–16)

## 2018-04-04 NOTE — Discharge Instructions (Signed)
Information on my medicine - XARELTO (rivaroxaban)  This medication education was reviewed with me or my healthcare representative as part of my discharge preparation.   WHY WAS XARELTO PRESCRIBED FOR YOU? Xarelto was prescribed to treat blood clots that may have been found in the veins of your legs (deep vein thrombosis) or in your lungs (pulmonary embolism) and to reduce the risk of them occurring again.  What do you need to know about Xarelto? The dose of Xarelto is one 20 mg tablet taken ONCE A DAY with your evening meal.  DO NOT stop taking Xarelto without talking to the health care provider who prescribed the medication.  Refill your prescription for 20 mg tablets before you run out.  After discharge, you should have regular check-up appointments with your healthcare provider that is prescribing your Xarelto.  In the future your dose may need to be changed if your kidney function changes by a significant amount.  What do you do if you miss a dose? If you miss a dose, take it as soon as you remember on the same day then continue your regularly scheduled once daily regimen the next day. Do not take two doses of Xarelto at the same time.   Important Safety Information Xarelto is a blood thinner medicine that can cause bleeding. You should call your healthcare provider right away if you experience any of the following: ? Bleeding from an injury or your nose that does not stop. ? Unusual colored urine (red or dark brown) or unusual colored stools (red or black). ? Unusual bruising for unknown reasons. ? A serious fall or if you hit your head (even if there is no bleeding).  Some medicines may interact with Xarelto and might increase your risk of bleeding while on Xarelto. To help avoid this, consult your healthcare provider or pharmacist prior to using any new prescription or non-prescription medications, including herbals, vitamins, non-steroidal anti-inflammatory drugs (NSAIDs)  and supplements.  This website has more information on Xarelto: [email or web address]

## 2018-04-04 NOTE — Progress Notes (Signed)
Occupational Therapy Treatment Patient Details Name: Steven Guerrero MRN: [id number] DOB: 1940/03/29 Today's Date: 04/04/2018    History of present illness Merrit Friesen Hoxworth is a 78 y.o. male with medical history significant for TIA, traumatic brain injury, right hip arthropathy, history of pulmonary embolism on long-term Coumadin therapy comes to the emergency room with right leg weakness which is already improving on the way to ER.  Pt's right LE continues with pain and weakness.   OT comments  Patient supine in bed and spouse present.  Patient demonstrates ability to complete bed mobility with supervision, sit to stand from EOB requires mod assist but simulated toilet transfer with arm rests able to complete with min guard given increased time and cueing for sequencing and technique.  Completed functional mobility to door and back with min assist, cueing for walker mgmt, L LE progression (as patient continues to drag LE, but spouse reports baseline), and safety.  Patient progressing slowly.  Spouse and patient agreeable to recommendations of hands on support for transfers, ADls and mobility, and follow up with home health services.  Will continue to follow.     Follow Up Recommendations  Home health OT;Supervision/Assistance - 24 hour    Equipment Recommendations  None recommended by OT    Recommendations for Other Services      Precautions / Restrictions Precautions Precautions: Fall Restrictions Weight Bearing Restrictions: No       Mobility Bed Mobility Overal bed mobility: Needs Assistance Bed Mobility: Supine to Sit;Sit to Supine     Supine to sit: HOB elevated;Supervision Sit to supine: Supervision   General bed mobility comments: increased time and effort, but no physical assistance required  Transfers Overall transfer level: Needs assistance Equipment used: Rolling walker (2 wheeled) Transfers: Sit to/from Stand Sit to Stand: Mod assist;Min guard         General  transfer comment: mod assist to transition to standing from EOB, but when presented with arm chair able to transition to standing with min guard after cueing for hand placement and technique     Balance Overall balance assessment: Needs assistance Sitting-balance support: No upper extremity supported Sitting balance-Leahy Scale: Fair     Standing balance support: During functional activity;Single extremity supported;Bilateral upper extremity supported Standing balance-Leahy Scale: Poor Standing balance comment: preference to UE support, but able to complete grooming standing with min guard                           ADL either performed or assessed with clinical judgement   ADL Overall ADL's : Needs assistance/impaired     Grooming: Min guard;Brushing hair;Standing                   Toilet Transfer: Min guard;Cueing for safety;Cueing for sequencing;Ambulation;RW Toilet Transfer Details (indicate cue type and reason): simulated to arm chair         Functional mobility during ADLs: Minimal assistance;Rolling walker;Cueing for safety;Cueing for sequencing General ADL Comments: Patient continues to require cueing for sequencing and techniques, dragging L LE requriing cueing to increase step pattern with L LE and for walker safety.      Vision   Vision Assessment?: No apparent visual deficits   Perception     Praxis      Cognition Arousal/Alertness: Awake/alert Behavior During Therapy: WFL for tasks assessed/performed Overall Cognitive Status: History of cognitive impairments - at baseline  General Comments: ho of TBI        Exercises     Shoulder Instructions       General Comments spouse present and supportive    Pertinent Vitals/ Pain       Pain Assessment: Faces Faces Pain Scale: Hurts little more Pain Location: B anterior thighs Pain Descriptors / Indicators: Discomfort;Grimacing;Sore Pain  Intervention(s): Limited activity within patient's tolerance;Monitored during session  Home Living                                          Prior Functioning/Environment              Frequency  Min 2X/week        Progress Toward Goals  OT Goals(current goals can now be found in the care plan section)     Acute Rehab OT Goals Patient Stated Goal: to have less pain  OT Goal Formulation: With patient Time For Goal Achievement: 04/17/18 Potential to Achieve Goals: Good  Plan Discharge plan remains appropriate;Frequency remains appropriate    Co-evaluation                 AM-PAC OT "6 Clicks" Daily Activity     Outcome Measure   Help from another person eating meals?: None Help from another person taking care of personal grooming?: A Little Help from another person toileting, which includes using toliet, bedpan, or urinal?: A Little Help from another person bathing (including washing, rinsing, drying)?: A Little Help from another person to put on and taking off regular upper body clothing?: None Help from another person to put on and taking off regular lower body clothing?: A Little 6 Click Score: 20    End of Session Equipment Utilized During Treatment: Rolling walker  OT Visit Diagnosis: Other abnormalities of gait and mobility (R26.89);Muscle weakness (generalized) (M62.81);Pain Pain - Right/Left: (bil) Pain - part of body: Leg   Activity Tolerance Patient tolerated treatment well   Patient Left in bed;with call bell/phone within reach;with bed alarm set;with family/visitor present   Nurse Communication Mobility status        Time: 3790-2409 OT Time Calculation (min): 19 min  Charges: OT General Charges $OT Visit: 1 Visit OT Treatments $Self Care/Home Management : 8-22 mins  Delight Stare, [place] Pager [phone] Office [phone]    Delight Stare 04/04/2018, 5:43 PM

## 2018-04-04 NOTE — Progress Notes (Addendum)
Progress Note    Steven Guerrero  KGM:[id number] DOB: [birth year]  DOA: 04/02/2018 PCP: Merri Brunette, MD    Brief Narrative:   Chief complaint: Right leg pain  Medical records reviewed and are as summarized below:  Steven Guerrero is an 78 y.o. male with past medical history significant of TIA, TBI, right hip arthropathy, and PE on Coumadin therapy; who presents with complaints of right leg pain and weakness.  Assessment/Plan:   Principal Problem:   TIA (transient ischemic attack) Active Problems:   Long term current use of anticoagulant   H/O total hip arthroplasty   Essential hypertension   History of stroke   History of pulmonary embolus (PE)  Right leg weakness, question TIA: On admission patient concern for possible stroke as a cause of symptoms right leg weakness.  CT angiogram of the head and neck along with MRI were negative for signs of acute stroke.  Neurology had been consulted.  Upon further investigation it appears both legs are painful to the touch.  With subtherapeutic INR there was concern for possible DVT.  TIA suspected to be less likely by neurology at this time. - Follow-up Doppler ultrasound and lower extremity - Continue physical therapy and Occupational Therapy - Check ESR  Subtherapeutic INR, history of PE: Initial INR subtherapeutic on Coumadin.  Review of records shows patient's been subtherapeutic on multiple occasions in the past.  Neurology recommends switching patient from Coumadin to alternative medication of Xarelto.  Essential hypertension - Continue Hyzaar - Hydralazine IV as needed elevated blood pressure  Hypokalemia: Potassium 3.3. - Continue to monitor and places  Chronic kidney disease stage III: Creatinine near patient's baseline at 1.46. - Continue to monitor   Body mass index is 27.19 kg/m.   Family Communication/Anticipated D/C date and plan/Code Status   DVT prophylaxis: Lovenox ordered until therapeutic on Coumadin Code  Status: Full Code.  Family Communication: No family present at bedside Disposition Plan: To be determined   Medical Consultants:    Neurology   Anti-Infectives:    None  Subjective:   Patient reports pain in both legs  Objective:    Vitals:   04/03/18 2011 04/04/18 0000 04/04/18 0324 04/04/18 0400  BP: 132/65 (!) 145/75 114/68   Pulse: 72 69 73   Resp: 18 18 18 17   Temp: 98.2 F (36.8 C) 98.1 F (36.7 C) 98.2 F (36.8 C)   TempSrc: Oral Oral Oral   SpO2: 95% 96% 92%   Weight:   87.2 kg   Height:        Intake/Output Summary (Last 24 hours) at 04/04/2018 0458 Last data filed at 04/04/2018 0346 Gross per 24 hour  Intake -  Output 1000 ml  Net -1000 ml   Filed Weights   04/02/18 1043 04/03/18 0341 04/04/18 0324  Weight: 90.7 kg 86.1 kg 87.2 kg    Exam: Constitutional: NAD, calm, comfortable Eyes: PERRL, lids and conjunctivae normal ENMT: Mucous membranes are moist. Posterior pharynx clear of any exudate or lesions. Neck: normal, supple, no masses, no thyromegaly Respiratory: clear to auscultation bilaterally, no wheezing, no crackles. Normal respiratory effort. No accessory muscle use.  Cardiovascular: Regular rate and rhythm, no murmurs / rubs / gallops. No extremity edema. 2+ pedal pulses. No carotid bruits.  Abdomen: no tenderness, no masses palpated. No hepatosplenomegaly. Bowel sounds positive.  Musculoskeletal: no clubbing / cyanosis.  Limited range of motion of right shoulder noted due to pain.  Skin: no rashes, lesions, ulcers. No induration  Neurologic: CN 2-12 grossly intact.  Mildly dysarthric.  Strength is 5/5 in both upper extremities, and 4+ right lower extremity and 5/5 in the left lower extremity.  No drift appreciated. Psychiatric: Normal judgment and insight. Alert and oriented x 3. Normal mood.    Data Reviewed:   I have personally reviewed following labs and imaging studies:  Labs: Labs show the following:   Basic Metabolic  Panel: Recent Labs  Lab 04/02/18 1127 04/03/18 0539  NA 139 141  K 4.1 3.4*  CL 106 108  CO2 23 20*  GLUCOSE 98 103*  BUN 18 20  CREATININE 1.53* 1.46*  CALCIUM 9.6 9.1   GFR Estimated Creatinine Clearance: 43.8 mL/min (A) (by C-G formula based on SCr of 1.46 mg/dL (H)). Liver Function Tests: Recent Labs  Lab 04/02/18 1127  AST 31  ALT 23  ALKPHOS 71  BILITOT 3.1*  PROT 7.4  ALBUMIN 3.9   No results for input(s): LIPASE, AMYLASE in the last 168 hours. No results for input(s): AMMONIA in the last 168 hours. Coagulation profile Recent Labs  Lab 04/02/18 1127 04/03/18 0539  INR 1.57 1.51    CBC: Recent Labs  Lab 04/02/18 1127  WBC 10.4  NEUTROABS 8.8*  HGB 13.7  HCT 42.0  MCV 95.2  PLT 249   Cardiac Enzymes: Recent Labs  Lab 04/03/18 0539  CKTOTAL 246   BNP (last 3 results) No results for input(s): PROBNP in the last 8760 hours. CBG: No results for input(s): GLUCAP in the last 168 hours. D-Dimer: No results for input(s): DDIMER in the last 72 hours. Hgb A1c: Recent Labs    04/02/18 1626  HGBA1C 5.8*   Lipid Profile: Recent Labs    04/03/18 0539  CHOL 78  HDL 45  LDLCALC 21  TRIG 62  CHOLHDL 1.7   Thyroid function studies: Recent Labs    04/02/18 1626  TSH 1.830   Anemia work up: No results for input(s): VITAMINB12, FOLATE, FERRITIN, TIBC, IRON, RETICCTPCT in the last 72 hours. Sepsis Labs: Recent Labs  Lab 04/02/18 1127  WBC 10.4    Microbiology No results found for this or any previous visit (from the past 240 hour(s)).  Procedures and diagnostic studies:  Ct Angio Head W Or Wo Contrast  Result Date: 04/02/2018 CLINICAL DATA:  TIA, initial exam. Personal history of CVA and traumatic brain injury. New onset of right lower extremity weakness. Patient states normal strength at 9 p.m. last evening. Weakness noticed when he woke up at 3 a.m. EXAM: CT ANGIOGRAPHY HEAD AND NECK TECHNIQUE: Multidetector CT imaging of the head and  neck was performed using the standard protocol during bolus administration of intravenous contrast. Multiplanar CT image reconstructions and MIPs were obtained to evaluate the vascular anatomy. Carotid stenosis measurements (when applicable) are obtained utilizing NASCET criteria, using the distal internal carotid diameter as the denominator. CONTRAST:  ISOVUE-370 IOPAMIDOL (ISOVUE-370) INJECTION 76% COMPARISON:  MRI brain 04/02/2018 CT head without contrast 04/02/2018 FINDINGS: CTA NECK FINDINGS Aortic arch: There is a common origin of the left common carotid artery and the innominate artery. Atherosclerotic changes are present at the aortic arch. There is no aneurysm. No significant stenosis is present at the origins of the great vessels. Right carotid system: Right common carotid artery is within normal limits proximally. Distal atherosclerotic changes are present. Atherosclerotic changes are present through the carotid bifurcation without a significant stenosis. The distal cervical right ICA is normal. Left carotid system: Atherosclerotic changes are present in the  proximal and distal left common carotid artery without a significant stenosis. There is mixed calcified and soft tissue plaque in the proximal left internal carotid artery. Minimal luminal diameter is 3 mm without a significant stenosis relative to the more distal vessels. Distal cervical left ICA is normal. Vertebral arteries: Atherosclerotic calcifications are present at the origins of the vertebral arteries bilaterally. Mild narrowing is present bilaterally. The left vertebral artery is slightly dominant. There are no other significant stenoses of either vertebral artery in the neck. Skeleton: Slight retrolisthesis is present at C3-4. Multilevel degenerative changes are present throughout cervical spine. There is chronic loss of disc height C4-5, C5-6, and C6-7. No focal lytic or blastic lesions are present. Other neck: No significant coastal  or submucosal lesions are present. Salivary glands are within normal limits. There is no significant adenopathy. Vocal cords are midline and symmetric. Trachea is within normal limits. Upper chest: Centrilobular emphysematous changes are present at the lung apices bilaterally. There is no nodule or mass lesion. Thoracic at inlet is within normal limits. The upper mediastinum is within normal limits. Review of the MIP images confirms the above findings CTA HEAD FINDINGS Anterior circulation: Atherosclerotic calcifications are present within the cavernous internal carotid artery on the right without a significant luminal stenosis relative to the more distal vessel. Precavernous and cavernous calcifications are present on the left. The lumen is narrowed to 1.5 mm in the proximal left cavernous ICA. This compares with 3.6 mm more distally. No other significant stenosis is present. The right A1 segment is dominant. The anterior communicating artery is patent. M1 segments are normal bilaterally. Bifurcations are intact. Left MCA branch vessels are slightly less robust than on the right. No significant focal stenosis or occlusion is present. Posterior circulation: The left vertebral artery is the dominant vessel. Atherosclerotic calcifications are present at the dural margin without significant stenosis bilaterally. PICA origins are visualized and normal. The vertebrobasilar junction is normal. Basilar artery is within normal limits. Both posterior cerebral arteries originate from the basilar tip. There is mild narrowing of the proximal left PCA relative to the more distal vessels. Distal branch vessel narrowing is present bilaterally. Venous sinuses: The dural sinuses are patent. Straight sinus deep cerebral veins are intact. Cortical veins are within normal limits. Anatomic variants: None Delayed phase: The postcontrast images demonstrate remote lacunar infarcts of the basal ganglia bilaterally. Diffuse white matter  disease is present. No pathologic enhancement is present. Review of the MIP images confirms the above findings IMPRESSION: 1. 60% stenosis of the proximal cavernous left internal carotid artery relative to the more distal vessels. 2. Slight decreased caliber of left MCA branch vessels compared to the right. This may reflect decreased perfusion due to the proximal stenosis. 3. No emergent large vessel occlusion. 4. Extensive atherosclerotic changes as described without other focal stenosis. 5. Mild diffuse small vessel disease present within the intracranial circulation. 6. Degenerative changes of the cervical spine. 7.  Emphysema (ICD10-J43.9). Electronically Signed   By: Marin Roberts M.D.   On: 04/02/2018 20:25   Ct Head Wo Contrast  Result Date: 04/02/2018 CLINICAL DATA:  Right leg weakness for several hours EXAM: CT HEAD WITHOUT CONTRAST TECHNIQUE: Contiguous axial images were obtained from the base of the skull through the vertex without intravenous contrast. COMPARISON:  10/01/2016 FINDINGS: Brain: Diffuse atrophic changes and chronic white matter ischemic changes are again noted and stable. Left thalamic lacunar infarct is noted. No findings to suggest acute hemorrhage, acute infarction or space-occupying mass lesion  is noted. Vascular: No hyperdense vessel or unexpected calcification. Skull: Normal. Negative for fracture or focal lesion. Sinuses/Orbits: No acute finding. Other: None. IMPRESSION: Chronic atrophic and ischemic changes stable from the prior exam. No acute abnormality noted. Electronically Signed   By: Alcide Clever M.D.   On: 04/02/2018 12:32   Ct Angio Neck W Or Wo Contrast  Result Date: 04/02/2018 CLINICAL DATA:  TIA, initial exam. Personal history of CVA and traumatic brain injury. New onset of right lower extremity weakness. Patient states normal strength at 9 p.m. last evening. Weakness noticed when he woke up at 3 a.m. EXAM: CT ANGIOGRAPHY HEAD AND NECK TECHNIQUE:  Multidetector CT imaging of the head and neck was performed using the standard protocol during bolus administration of intravenous contrast. Multiplanar CT image reconstructions and MIPs were obtained to evaluate the vascular anatomy. Carotid stenosis measurements (when applicable) are obtained utilizing NASCET criteria, using the distal internal carotid diameter as the denominator. CONTRAST:  ISOVUE-370 IOPAMIDOL (ISOVUE-370) INJECTION 76% COMPARISON:  MRI brain 04/02/2018 CT head without contrast 04/02/2018 FINDINGS: CTA NECK FINDINGS Aortic arch: There is a common origin of the left common carotid artery and the innominate artery. Atherosclerotic changes are present at the aortic arch. There is no aneurysm. No significant stenosis is present at the origins of the great vessels. Right carotid system: Right common carotid artery is within normal limits proximally. Distal atherosclerotic changes are present. Atherosclerotic changes are present through the carotid bifurcation without a significant stenosis. The distal cervical right ICA is normal. Left carotid system: Atherosclerotic changes are present in the proximal and distal left common carotid artery without a significant stenosis. There is mixed calcified and soft tissue plaque in the proximal left internal carotid artery. Minimal luminal diameter is 3 mm without a significant stenosis relative to the more distal vessels. Distal cervical left ICA is normal. Vertebral arteries: Atherosclerotic calcifications are present at the origins of the vertebral arteries bilaterally. Mild narrowing is present bilaterally. The left vertebral artery is slightly dominant. There are no other significant stenoses of either vertebral artery in the neck. Skeleton: Slight retrolisthesis is present at C3-4. Multilevel degenerative changes are present throughout cervical spine. There is chronic loss of disc height C4-5, C5-6, and C6-7. No focal lytic or blastic lesions are  present. Other neck: No significant coastal or submucosal lesions are present. Salivary glands are within normal limits. There is no significant adenopathy. Vocal cords are midline and symmetric. Trachea is within normal limits. Upper chest: Centrilobular emphysematous changes are present at the lung apices bilaterally. There is no nodule or mass lesion. Thoracic at inlet is within normal limits. The upper mediastinum is within normal limits. Review of the MIP images confirms the above findings CTA HEAD FINDINGS Anterior circulation: Atherosclerotic calcifications are present within the cavernous internal carotid artery on the right without a significant luminal stenosis relative to the more distal vessel. Precavernous and cavernous calcifications are present on the left. The lumen is narrowed to 1.5 mm in the proximal left cavernous ICA. This compares with 3.6 mm more distally. No other significant stenosis is present. The right A1 segment is dominant. The anterior communicating artery is patent. M1 segments are normal bilaterally. Bifurcations are intact. Left MCA branch vessels are slightly less robust than on the right. No significant focal stenosis or occlusion is present. Posterior circulation: The left vertebral artery is the dominant vessel. Atherosclerotic calcifications are present at the dural margin without significant stenosis bilaterally. PICA origins are visualized and normal.  The vertebrobasilar junction is normal. Basilar artery is within normal limits. Both posterior cerebral arteries originate from the basilar tip. There is mild narrowing of the proximal left PCA relative to the more distal vessels. Distal branch vessel narrowing is present bilaterally. Venous sinuses: The dural sinuses are patent. Straight sinus deep cerebral veins are intact. Cortical veins are within normal limits. Anatomic variants: None Delayed phase: The postcontrast images demonstrate remote lacunar infarcts of the basal  ganglia bilaterally. Diffuse white matter disease is present. No pathologic enhancement is present. Review of the MIP images confirms the above findings IMPRESSION: 1. 60% stenosis of the proximal cavernous left internal carotid artery relative to the more distal vessels. 2. Slight decreased caliber of left MCA branch vessels compared to the right. This may reflect decreased perfusion due to the proximal stenosis. 3. No emergent large vessel occlusion. 4. Extensive atherosclerotic changes as described without other focal stenosis. 5. Mild diffuse small vessel disease present within the intracranial circulation. 6. Degenerative changes of the cervical spine. 7.  Emphysema (ICD10-J43.9). Electronically Signed   By: Marin Roberts M.D.   On: 04/02/2018 20:25   Mr Brain Wo Contrast  Result Date: 04/02/2018 CLINICAL DATA:  Right leg weakness. EXAM: MRI HEAD WITHOUT CONTRAST TECHNIQUE: Multiplanar, multiecho pulse sequences of the brain and surrounding structures were obtained without intravenous contrast. COMPARISON:  CT head 04/02/2018 FINDINGS: Brain: Negative for acute infarct. Moderate to advanced cortical atrophy. Extensive chronic ischemic change throughout the white matter and basal ganglia bilaterally. Chronic ischemia in the pons. Negative for mass lesion. Chronic hemorrhage right external capsule. No midline shift. Negative for hydrocephalus. Vascular: Normal arterial flow voids Skull and upper cervical spine: Negative Sinuses/Orbits: Bilateral cataract surgery.  Paranasal sinuses clear Other: None IMPRESSION: Negative for acute infarct Moderate to advanced atrophy with advanced chronic microvascular ischemia. Electronically Signed   By: Marlan Palau M.D.   On: 04/02/2018 16:03   Vas [place] Lower Extremity Venous (dvt)  Result Date: 04/03/2018  Lower Venous Study Indications: Pain. Other Indications: Subtherapeutic INR. Limitations: Body habitus and Slow breathing. Comparison Study: No prior study  on file for comparison Performing Technologist: Sherren Kerns RVS  Examination Guidelines: A complete evaluation includes B-mode imaging, spectral Doppler, color Doppler, and power Doppler as needed of all accessible portions of each vessel. Bilateral testing is considered an integral part of a complete examination. Limited examinations for reoccurring indications may be performed as noted.  Right Venous Findings: +---------+---------------+---------+-----------+----------+-------+          CompressibilityPhasicitySpontaneityPropertiesSummary +---------+---------------+---------+-----------+----------+-------+ CFV      Full           Yes      Yes                          +---------+---------------+---------+-----------+----------+-------+ SFJ      Full                                                 +---------+---------------+---------+-----------+----------+-------+ FV Prox  Full                                                 +---------+---------------+---------+-----------+----------+-------+ FV Mid   Full                                                 +---------+---------------+---------+-----------+----------+-------+  FV DistalFull                                                 +---------+---------------+---------+-----------+----------+-------+ PFV      Full                                                 +---------+---------------+---------+-----------+----------+-------+ POP      Full           Yes      Yes                          +---------+---------------+---------+-----------+----------+-------+ PTV      Full                                                 +---------+---------------+---------+-----------+----------+-------+ PERO     Full                                                 +---------+---------------+---------+-----------+----------+-------+  Left Venous Findings:  +---------+---------------+---------+-----------+----------+-------+          CompressibilityPhasicitySpontaneityPropertiesSummary +---------+---------------+---------+-----------+----------+-------+ CFV      Full           Yes      Yes                          +---------+---------------+---------+-----------+----------+-------+ SFJ      Full                                                 +---------+---------------+---------+-----------+----------+-------+ FV Prox  Full                                                 +---------+---------------+---------+-----------+----------+-------+ FV Mid   Full                                                 +---------+---------------+---------+-----------+----------+-------+ FV DistalFull                                                 +---------+---------------+---------+-----------+----------+-------+ PFV      Full                                                 +---------+---------------+---------+-----------+----------+-------+  POP      Full           Yes      Yes                          +---------+---------------+---------+-----------+----------+-------+ PTV      Full                                                 +---------+---------------+---------+-----------+----------+-------+ PERO     Full                                                 +---------+---------------+---------+-----------+----------+-------+    Summary: Right: There is no evidence of deep vein thrombosis in the lower extremity. Left: There is no evidence of deep vein thrombosis in the lower extremity.  *See table(s) above for measurements and observations.    Preliminary     Medications:   . citalopram  40 mg Oral QHS  . gabapentin  100 mg Oral BID  . hydrochlorothiazide  25 mg Oral Daily  . losartan  100 mg Oral Daily  . multivitamin with minerals  1 tablet Oral Daily  . pantoprazole  40 mg Oral Daily  . rivaroxaban  20  mg Oral Q supper  . traZODone  50 mg Oral QHS  . vitamin B-12  1,000 mcg Oral Daily   Continuous Infusions: None   LOS: 0 days   Steven Guerrero  [facility]   *Please refer to [email or web address], password [email or web address] to get updated schedule on who will round on this patient, as hospitalists switch teams weekly. If 7PM-7AM, please contact night-coverage at [email or web address], password [email or web address] for any overnight needs.

## 2018-04-05 DIAGNOSIS — I10 Essential (primary) hypertension: Secondary | ICD-10-CM | POA: Diagnosis not present

## 2018-04-05 DIAGNOSIS — R29898 Other symptoms and signs involving the musculoskeletal system: Secondary | ICD-10-CM | POA: Diagnosis not present

## 2018-04-05 LAB — BASIC METABOLIC PANEL
Anion gap: 9 (ref 5–15)
BUN: 30 mg/dL — ABNORMAL HIGH (ref 8–23)
CO2: 23 mmol/L (ref 22–32)
Calcium: 8.8 mg/dL — ABNORMAL LOW (ref 8.9–10.3)
Chloride: 105 mmol/L (ref 98–111)
Creatinine, Ser: 1.81 mg/dL — ABNORMAL HIGH (ref 0.61–1.24)
GFR calc Af Amer: 41 mL/min — ABNORMAL LOW (ref >60)
GFR calc non Af Amer: 35 mL/min — ABNORMAL LOW (ref >60)
Glucose, Bld: 147 mg/dL — ABNORMAL HIGH (ref 70–99)
POTASSIUM: 3.6 mmol/L (ref 3.5–5.1)
Sodium: 137 mmol/L (ref 135–145)

## 2018-04-05 MED ORDER — POTASSIUM CHLORIDE CRYS ER 20 MEQ PO TBCR
40.0000 meq | EXTENDED_RELEASE_TABLET | Freq: Once | ORAL | Status: AC
  Administered 2018-04-05: 40 meq via ORAL
  Filled 2018-04-05: qty 2

## 2018-04-05 MED ORDER — POTASSIUM CHLORIDE CRYS ER 20 MEQ PO TBCR
40.0000 meq | EXTENDED_RELEASE_TABLET | ORAL | Status: AC
  Administered 2018-04-05: 40 meq via ORAL
  Filled 2018-04-05: qty 2

## 2018-04-05 MED ORDER — RIVAROXABAN 20 MG PO TABS: 20.0000 mg | ORAL_TABLET | Freq: Every day | ORAL | 0 refills | Status: DC

## 2018-04-05 MED ORDER — SODIUM CHLORIDE 0.9 % IV SOLN
INTRAVENOUS | Status: DC
  Administered 2018-04-05: 05:00:00 via INTRAVENOUS

## 2018-04-05 MED ORDER — CITALOPRAM HYDROBROMIDE 40 MG PO TABS: 20.0000 mg | ORAL_TABLET | Freq: Every day | ORAL | Status: DC

## 2018-04-05 NOTE — Progress Notes (Addendum)
Occupational Therapy Treatment Patient Details Name: Steven Guerrero MRN: [id number] DOB: 17-Nov-1940 Today's Date: 04/05/2018    History of present illness Steven Guerrero is a 78 y.o. male with medical history significant for TIA, traumatic brain injury, right hip arthropathy, history of pulmonary embolism on long-term Coumadin therapy comes to the emergency room with right leg weakness which is already improving on the way to ER.  Pt's right LE continues with pain and weakness.   OT comments  Pt eager to discharge home.  Wife present.  Wife will be working during day.  They are aware of his risk of falls, but choose to go home.  Pt will stay in w/c during the day while wife works.  Discussed use of urinal instead of transferring to toilet, and to keep cell phone with him at all times _ they verbalize understanding.  Worked on sit to stand as he requires min A to stand and demonstrates posterior bias.  Worked on forward translation of trunk with cues.    I have discussed the patient's current level of function related to ADLs, balance, and functional moblity  with the patient and wife.  They acknowledge understanding of this and feel they can provide the level of care the patient will need at home.      Follow Up Recommendations  Home health OT;Supervision/Assistance - 24 hour    Equipment Recommendations  None recommended by OT    Recommendations for Other Services      Precautions / Restrictions Precautions Precautions: Fall       Mobility Bed Mobility Overal bed mobility: Needs Assistance Bed Mobility: Supine to Sit;Sit to Supine     Supine to sit: Supervision Sit to supine: Supervision   General bed mobility comments: increased time and effort, but no physical assistance required  Transfers Overall transfer level: Needs assistance Equipment used: Rolling walker (2 wheeled) Transfers: Sit to/from Stand Sit to Stand: Min assist         General transfer comment: Pt  performed sit to stand several times from bed and was unable to stand without assist - required assist to rise     Balance Overall balance assessment: Needs assistance Sitting-balance support: Feet supported Sitting balance-Leahy Scale: Good     Standing balance support: During functional activity;Single extremity supported Standing balance-Leahy Scale: Poor Standing balance comment: requires UE support                            ADL either performed or assessed with clinical judgement   ADL Overall ADL's : Needs assistance/impaired     Grooming: Wash/dry hands;Min guard;Standing               Lower Body Dressing: Sit to/from stand;Minimal assistance Lower Body Dressing Details (indicate cue type and reason): min A for sit to stand  Toilet Transfer: Min guard;Grab bars;RW   Toileting- Water quality scientist and Hygiene: Min guard;Sit to/from stand       Functional mobility during ADLs: Min guard;Rolling walker General ADL Comments: wife reports that pt will be alone while wife works.  They report he will use a w/c when she is gone, they both understand the risk of falls, and still want to go home.  Advised pt to keep cell phone close at all time, and to use urinal rather than attempting to transfer      [place]  Cognition Arousal/Alertness: Awake/alert Behavior During Therapy: WFL for tasks assessed/performed Overall Cognitive Status: History of cognitive impairments - at baseline                                 General Comments: ho of TBI        Exercises     Shoulder Instructions       General Comments wife in the room and agrees that he is safe for home with intermittent help; agreed to have someone to check on him while she is at work next couple of days and discussed with pt to keep phone in his pocket or nearby; wife also repors w/c will fit in bathroom door and he can stay in w/c while she is  at work if needed    Pertinent Vitals/ Pain       Pain Assessment: Faces Pain Score: 3  Faces Pain Scale: No hurt Pain Location: B anterior thighs Pain Descriptors / Indicators: Sore Pain Intervention(s): Monitored during session;Repositioned  Home Living                                          Prior Functioning/Environment              Frequency  Min 2X/week        Progress Toward Goals  OT Goals(current goals can now be found in the care plan section)  Progress towards OT goals: Progressing toward goals     Plan Discharge plan remains appropriate    Co-evaluation                 AM-PAC OT "6 Clicks" Daily Activity     Outcome Measure   Help from another person eating meals?: None Help from another person taking care of personal grooming?: A Little Help from another person toileting, which includes using toliet, bedpan, or urinal?: A Little Help from another person bathing (including washing, rinsing, drying)?: A Little Help from another person to put on and taking off regular upper body clothing?: None Help from another person to put on and taking off regular lower body clothing?: A Little 6 Click Score: 20    End of Session Equipment Utilized During Treatment: Rolling walker  OT Visit Diagnosis: Other abnormalities of gait and mobility (R26.89);Muscle weakness (generalized) (M62.81);Pain Pain - part of body: Leg   Activity Tolerance Patient tolerated treatment well   Patient Left in bed;with call bell/phone within reach;with family/visitor present   Nurse Communication Mobility status        Time: 4825-0037 OT Time Calculation (min): 35 min  Charges: OT General Charges $OT Visit: 1 Visit OT Treatments $Self Care/Home Management : 23-37 mins  Lucille Passy, OTR/L [place] Pager [phone] Office [phone]    Lucille Passy M 04/05/2018, 3:00 PM

## 2018-04-05 NOTE — Progress Notes (Addendum)
Progress Note    Steven Guerrero  ZOX:[id number] DOB: Sep 23, 1940  DOA: 04/02/2018 PCP: Merri Brunette, MD    Brief Narrative:   Chief complaint: Right leg pain  Medical records reviewed and are as summarized below:  Steven Guerrero is an 78 y.o. male with past medical history significant of TIA, TBI, right hip arthropathy, and PE on Coumadin therapy; who presents with complaints of right leg pain and weakness.  Assessment/Plan:   Active Problems:   Long term current use of anticoagulant   H/O total hip arthroplasty   Essential hypertension   History of stroke   History of pulmonary embolus (PE)   Bilateral leg pain  Bilateral leg weakness pain, doubt TIA: On admission patient concern for possible stroke as a cause of right leg symptoms.  Patient denies any recent falls or trauma.  CT angiogram of the head and neck along with MRI were negative for signs of acute stroke.  Neurology had been consulted.  Upon further investigation it appears both legs are painful to the touch.  With subtherapeutic INR there was concern for possible DVT.  TIA suspected to be less likely by neurology at this time.  CPK was 246, sedimentation rate 16, and vascular duplex of lower extremities negative for signs of a DVT.  Wife present at bedside today but reports unable to care for the patient because he has not gotten up and walked. - Ordered for patient to be seen again  - Discuss other options as medically ready for discharge   Chronic anticoagulation, history of PE: Initial INR subtherapeutic on Coumadin.  Review of records shows patient's been subtherapeutic on multiple occasions in the past.  Neurology recommends switching patient from Coumadin to alternative medication of Xarelto. - Continue Xarelto  Essential hypertension - Continue Hyzaar - Hydralazine IV as needed elevated blood pressure  Hypokalemia: Potassium 3.2 today.   - Give 40 mEq of potassium chloride - Continue to monitor   Acute kidney  injury superimposed on chronic kidney disease stage III: Creatinine jumped from 1.46-1.7 today. - Continue to monitor - NS IVFs   Body mass index is 27.63 kg/m.   Family Communication/Anticipated D/C date and plan/Code Status   DVT prophylaxis: Xarelto Code Status: Full Code.  Family Communication: No family present at bedside Disposition Plan: To be determined   Medical Consultants:    Neurology   Anti-Infectives:    None  Subjective:   Patient reports pain in both legs  Objective:    Vitals:   04/04/18 1537 04/04/18 1942 04/04/18 2319 04/05/18 0338  BP: (!) 110/55 (!) 98/57 100/62 104/65  Pulse: 66 79 65 63  Resp: 18 18 20 20   Temp: 98.1 F (36.7 C) 98.4 F (36.9 C) 98.4 F (36.9 C) 98.1 F (36.7 C)  TempSrc: Oral Oral Oral Oral  SpO2: 96% 92% 93% 92%  Weight:    88.6 kg  Height:        Intake/Output Summary (Last 24 hours) at 04/05/2018 0424 Last data filed at 04/04/2018 2200 Gross per 24 hour  Intake 120 ml  Output -  Net 120 ml   Filed Weights   04/03/18 0341 04/04/18 0324 04/05/18 0338  Weight: 86.1 kg 87.2 kg 88.6 kg    Exam: Constitutional: NAD, calm, comfortable Eyes: PERRL, lids and conjunctivae normal ENMT: Mucous membranes are moist. Posterior pharynx clear of any exudate or lesions. Neck: normal, supple, no masses, no thyromegaly Respiratory: clear to auscultation bilaterally, no wheezing, no crackles.  Normal respiratory effort. No accessory muscle use.  Cardiovascular: Regular rate and rhythm, no murmurs / rubs / gallops. No extremity edema. 2+ pedal pulses. No carotid bruits.  Abdomen: no tenderness, no masses palpated. No hepatosplenomegaly. Bowel sounds positive.  Musculoskeletal: no clubbing / cyanosis.  Limited range of motion of right shoulder noted due to pain.  Skin: no rashes, lesions, ulcers. No induration Neurologic: CN 2-12 grossly intact.  Mildly dysarthric.  Strength is 5/5 in both upper extremities, and 4+ right lower  extremity and 5/5 in the left lower extremity.  No drift appreciated. Psychiatric: Normal judgment and insight. Alert and oriented x 3. Normal mood.    Data Reviewed:   I have personally reviewed following labs and imaging studies:  Labs: Labs show the following:   Basic Metabolic Panel: Recent Labs  Lab 04/02/18 1127 04/03/18 0539 04/04/18 0533  NA 139 141 138  K 4.1 3.4* 3.2*  CL 106 108 104  CO2 23 20* 25  GLUCOSE 98 103* 105*  BUN 18 20 23   CREATININE 1.53* 1.46* 1.71*  CALCIUM 9.6 9.1 9.0   GFR Estimated Creatinine Clearance: 37.4 mL/min (A) (by C-G formula based on SCr of 1.71 mg/dL (H)). Liver Function Tests: Recent Labs  Lab 04/02/18 1127  AST 31  ALT 23  ALKPHOS 71  BILITOT 3.1*  PROT 7.4  ALBUMIN 3.9   No results for input(s): LIPASE, AMYLASE in the last 168 hours. No results for input(s): AMMONIA in the last 168 hours. Coagulation profile Recent Labs  Lab 04/02/18 1127 04/03/18 0539 04/04/18 0533  INR 1.57 1.51 3.56    CBC: Recent Labs  Lab 04/02/18 1127 04/04/18 0533  WBC 10.4 10.3  NEUTROABS 8.8*  --   HGB 13.7 12.4*  HCT 42.0 38.1*  MCV 95.2 94.3  PLT 249 222   Cardiac Enzymes: Recent Labs  Lab 04/03/18 0539  CKTOTAL 246   BNP (last 3 results) No results for input(s): PROBNP in the last 8760 hours. CBG: No results for input(s): GLUCAP in the last 168 hours. D-Dimer: No results for input(s): DDIMER in the last 72 hours. Hgb A1c: Recent Labs    04/02/18 1626  HGBA1C 5.8*   Lipid Profile: Recent Labs    04/03/18 0539  CHOL 78  HDL 45  LDLCALC 21  TRIG 62  CHOLHDL 1.7   Thyroid function studies: Recent Labs    04/02/18 1626  TSH 1.830   Anemia work up: No results for input(s): VITAMINB12, FOLATE, FERRITIN, TIBC, IRON, RETICCTPCT in the last 72 hours. Sepsis Labs: Recent Labs  Lab 04/02/18 1127 04/04/18 0533  WBC 10.4 10.3    Microbiology No results found for this or any previous visit (from the past  240 hour(s)).  Procedures and diagnostic studies:  Vas [place] Lower Extremity Venous (dvt)  Result Date: 04/04/2018  Lower Venous Study Indications: Pain. Other Indications: Subtherapeutic INR. Limitations: Body habitus and Slow breathing. Comparison Study: No prior study on file for comparison Performing Technologist: Sherren Kerns RVS  Examination Guidelines: A complete evaluation includes B-mode imaging, spectral Doppler, color Doppler, and power Doppler as needed of all accessible portions of each vessel. Bilateral testing is considered an integral part of a complete examination. Limited examinations for reoccurring indications may be performed as noted.  Right Venous Findings: +---------+---------------+---------+-----------+----------+-------+          CompressibilityPhasicitySpontaneityPropertiesSummary +---------+---------------+---------+-----------+----------+-------+ CFV      Full           Yes  Yes                          +---------+---------------+---------+-----------+----------+-------+ SFJ      Full                                                 +---------+---------------+---------+-----------+----------+-------+ FV Prox  Full                                                 +---------+---------------+---------+-----------+----------+-------+ FV Mid   Full                                                 +---------+---------------+---------+-----------+----------+-------+ FV DistalFull                                                 +---------+---------------+---------+-----------+----------+-------+ PFV      Full                                                 +---------+---------------+---------+-----------+----------+-------+ POP      Full           Yes      Yes                          +---------+---------------+---------+-----------+----------+-------+ PTV      Full                                                  +---------+---------------+---------+-----------+----------+-------+ PERO     Full                                                 +---------+---------------+---------+-----------+----------+-------+  Left Venous Findings: +---------+---------------+---------+-----------+----------+-------+          CompressibilityPhasicitySpontaneityPropertiesSummary +---------+---------------+---------+-----------+----------+-------+ CFV      Full           Yes      Yes                          +---------+---------------+---------+-----------+----------+-------+ SFJ      Full                                                 +---------+---------------+---------+-----------+----------+-------+ FV Prox  Full                                                 +---------+---------------+---------+-----------+----------+-------+  FV Mid   Full                                                 +---------+---------------+---------+-----------+----------+-------+ FV DistalFull                                                 +---------+---------------+---------+-----------+----------+-------+ PFV      Full                                                 +---------+---------------+---------+-----------+----------+-------+ POP      Full           Yes      Yes                          +---------+---------------+---------+-----------+----------+-------+ PTV      Full                                                 +---------+---------------+---------+-----------+----------+-------+ PERO     Full                                                 +---------+---------------+---------+-----------+----------+-------+    Summary: Right: There is no evidence of deep vein thrombosis in the lower extremity. Left: There is no evidence of deep vein thrombosis in the lower extremity.  *See table(s) above for measurements and observations. Electronically signed by Sherald Hess MD on  04/04/2018 at 10:00:03 AM.    Final     Medications:   . citalopram  40 mg Oral QHS  . gabapentin  100 mg Oral BID  . hydrochlorothiazide  25 mg Oral Daily  . losartan  100 mg Oral Daily  . multivitamin with minerals  1 tablet Oral Daily  . pantoprazole  40 mg Oral Daily  . rivaroxaban  20 mg Oral Q supper  . traZODone  50 mg Oral QHS  . vitamin B-12  1,000 mcg Oral Daily   Continuous Infusions: None   LOS: 0 days   Steven Guerrero  [facility]   *Please refer to [email or web address], password [email or web address] to get updated schedule on who will round on this patient, as hospitalists switch teams weekly. If 7PM-7AM, please contact night-coverage at [email or web address], password [email or web address] for any overnight needs.

## 2018-04-05 NOTE — Progress Notes (Signed)
Physical Therapy Treatment Patient Details Name: Steven Guerrero MRN: [id number] DOB: 08-Jan-1941 Today's Date: 04/05/2018    History of Present Illness Steven Guerrero is a 78 y.o. male with medical history significant for TIA, traumatic brain injury, right hip arthropathy, history of pulmonary embolism on long-term Coumadin therapy comes to the emergency room with right leg weakness which is already improving on the way to ER.  Pt's right LE continues with pain and weakness.    PT Comments    Patient progressing with mobility this session with decreased LE pain and improved balance with ambulation.  Still struggled a little to stand and sat with uncontrolled descent, but usually goes to recliner, not bed and can stay in w/c if needed while wife at work.  Remains appropriate for HHPT and intermittent family support.    Follow Up Recommendations  Home health PT;Supervision - Intermittent     Equipment Recommendations  None recommended by PT    Recommendations for Other Services       Precautions / Restrictions Precautions Precautions: Fall    Mobility  Bed Mobility         Supine to sit: HOB elevated;Supervision Sit to supine: Supervision   General bed mobility comments: increased time and effort, but no physical assistance required  Transfers Overall transfer level: Needs assistance Equipment used: Rolling walker (2 wheeled) Transfers: Sit to/from Stand Sit to Stand: Min guard         General transfer comment: slow to rise so assist for safety, usually sleeps in recliner and has armrests to help him stand  Ambulation/Gait Ambulation/Gait assistance: Supervision Gait Distance (Feet): 150 Feet Assistive device: Rolling walker (2 wheeled) Gait Pattern/deviations: Step-to pattern;Step-through pattern;Shuffle Gait velocity: slow, but steady   General Gait Details: dragging L foot initially, then later able to lift it up for clearance once back in the room, denies pain with  ambulation; no LOB of physical assist   Stairs             Wheelchair Mobility    Modified Rankin (Stroke Patients Only)       Balance Overall balance assessment: Needs assistance Sitting-balance support: Feet supported Sitting balance-Leahy Scale: Good     Standing balance support: Bilateral upper extremity supported Standing balance-Leahy Scale: Poor Standing balance comment: UE support for balance in standing                            Cognition Arousal/Alertness: Awake/alert Behavior During Therapy: WFL for tasks assessed/performed Overall Cognitive Status: History of cognitive impairments - at baseline                                 General Comments: ho of TBI      Exercises      General Comments General comments (skin integrity, edema, etc.): wife in the room and agrees that he is safe for home with intermittent help; agreed to have someone to check on him while she is at work next couple of days and discussed with pt to keep phone in his pocket or nearby; wife also repors w/c will fit in bathroom door and he can stay in w/c while she is at work if needed      Pertinent Vitals/Pain Pain Assessment: 0-10 Pain Score: 3  Pain Location: B anterior thighs Pain Descriptors / Indicators: Sore Pain Intervention(s): Monitored during session;Repositioned  Home Living                      Prior Function            PT Goals (current goals can now be found in the care plan section) Progress towards PT goals: Progressing toward goals    Frequency    Min 3X/week      PT Plan Current plan remains appropriate    Co-evaluation              AM-PAC PT "6 Clicks" Mobility   Outcome Measure  Help needed turning from your back to your side while in a flat bed without using bedrails?: A Little Help needed moving from lying on your back to sitting on the side of a flat bed without using bedrails?: A Little Help  needed moving to and from a bed to a chair (including a wheelchair)?: None Help needed standing up from a chair using your arms (e.g., wheelchair or bedside chair)?: A Little Help needed to walk in hospital room?: None Help needed climbing 3-5 steps with a railing? : A Little 6 Click Score: 20    End of Session Equipment Utilized During Treatment: Gait belt Activity Tolerance: Patient tolerated treatment well Patient left: in bed;with call bell/phone within reach;with family/visitor present   PT Visit Diagnosis: Other abnormalities of gait and mobility (R26.89)     Time: 1037-1100 PT Time Calculation (min) (ACUTE ONLY): 23 min  Charges:  $Gait Training: 8-22 mins $Self Care/Home Management: [place], [place] [phone] 04/05/2018    Reginia Naas 04/05/2018, 11:48 AM

## 2018-04-05 NOTE — Care Management Obs Status (Signed)
[facility] NOTIFICATION   Patient Details  Name: Steven Guerrero MRN: [id number] Date of Birth: [birth year]   Medicare Observation Status Notification Given:  Yes    Pollie Friar, RN 04/05/2018, 11:31 AM

## 2018-04-05 NOTE — Care Management (Signed)
#    1.   S/  W    S/W  Steven Guerrero   @ [place] # [phone]   1.  XARELTO    20 MG DAILY COVER- YES CO-PAY- $ 47.00 TIER-  3 DRUG PRIOR APPROVAL- NO  2.  XARELTO  15 MG BID COVER- YES CO-PAY- $ 47.00 TIER-3  DRUG PRIOR APPROVAL- NO   PREFERRED PHARMACY - YES  [facility] M/O - MANDATE THAT PATIENT USE M/O PATIENT WILL NEED TO [employer]  TO USE RETAIL

## 2018-04-05 NOTE — Progress Notes (Signed)
Patient assisted out via wheelchair and into his car; driven home by his wife.

## 2018-04-05 NOTE — Care Management Note (Signed)
Case Management Note  Patient Details  Name: Steven Guerrero MRN: [id number] Date of Birth: Aug 23, 1940  Subjective/Objective:   To in to r/o CVA. He is from home with spouse. Wife works during the day.  DME: all needed No issues obtaining his medications. No issues with transportation. Wife drives.               PT changed rec to intermittent supervision which the wife can provide.  Action/Plan: Pt discharging home with [facility] services. CM provided choice from [email or web address]. [facility] selected for [facility] services. CM notified Dorian Pod with [facility] and she accepted the referral. Pt starting on Xarelto. CM provided him 30 day free card. His co pay will be $47/ month. Wife to discuss the cost with his PCP.  Wife to provide transportation home.  Expected Discharge Date:  04/05/18               Expected Discharge Plan:  [place]  In-House Referral:     Discharge planning Services  CM Consult, Medication Assistance(Xarelto card)  Post Acute Care Choice:  Home Health Choice offered to:  Patient, Spouse  DME Arranged:    DME Agency:     HH Arranged:  PT, OT HH Agency:  [facility]  Status of Service:  Completed, signed off  If discussed at [place] of Stay Meetings, dates discussed:    Additional Comments:  Pollie Friar, RN 04/05/2018, 1:32 PM

## 2018-04-05 NOTE — Discharge Summary (Signed)
Physician Discharge Summary  Steven Guerrero Noon DDU:[id number] DOB: 02-16-1941 DOA: 04/02/2018  PCP: Deland Pretty, MD  Admit date: 04/02/2018 Discharge date: 04/05/2018  Time spent: 45 minutes  Recommendations for Outpatient Follow-up:  1. Follow up with PCP 1-2 weeks for evaluation of BP, kidney function, generalized weakness, potassium level 2. PT/OT, RN maximize [facility] services   Discharge Diagnoses:  Active Problems:   Long term current use of anticoagulant   H/O total hip arthroplasty   Essential hypertension   History of stroke   History of pulmonary embolus (PE)   Bilateral leg pain   Discharge Condition: stable  Diet recommendation: heart healthy  Filed Weights   04/03/18 0341 04/04/18 0324 04/05/18 0338  Weight: 86.1 kg 87.2 kg 88.6 kg    History of present illness:  Presented with 3 day hx of worsening generalized weakness. On 1/11 he was unable to rise from recliner. Initially concern for tia/stroke.   Hospital Course:  Right leg weakness, question TIA: On admission patient concern for possible stroke as a cause of symptoms right leg weakness.  CT angiogram of the head and neck along with MRI were negative for signs of acute stroke. Evaluated by neuro who recommended changing coumadin to Xarelto and stopping asa.    Upon further investigation it appears both legs are painful to the touch.  With subtherapeutic INR there was concern for possible DVT. Doppler negative. Not orthostatic.   Subtherapeutic INR, history of PE: Initial INR subtherapeutic on Coumadin.  Review of records shows patient's been subtherapeutic on multiple occasions in the past.  Neurology recommends switching patient from Coumadin to alternative medication of Xarelto.  Essential hypertension. BP on soft side. Home meds include Hyzaar - stop Hyzaar -follow up with PCP  Hypokalemia: Potassium 3.3. repleted and resolved at discharge. OP follow up   Acute kidney disease stage III: Creatinine near  patient's baseline at 1.46. creatinine trending up at discharge. Holding nephrotoxins at discharge. Recommend bmet in 1 week    Procedures:  Echo with EF 55% grade 1 DD  Doppler negative DVT  Consultations:  Dr. Erlinda Hong neurology  Discharge Exam: Vitals:   04/05/18 0746 04/05/18 1142  BP: (!) 115/58 101/72  Pulse: 68 69  Resp: 20 18  Temp: 98 F (36.7 C) 97.6 F (36.4 C)  SpO2: 93% 96%    General: well nourished in no acute distress Cardiovascular: irregularly irregular no mgr no LE edema Respiratory: normal effort BS clear bilaterally no wheeze  Discharge Instructions   Discharge Instructions    Diet - low sodium heart healthy   Complete by:  As directed    Discharge instructions   Complete by:  As directed    Home health BMP 1 week   Increase activity slowly   Complete by:  As directed      Allergies as of 04/05/2018      Reactions   Atenolol Other (See Comments)   Does not remember reaction   Lotensin [benazepril Hcl] Other (See Comments)   Does not remember reaction   Wellbutrin [bupropion] Other (See Comments)   Does not remember reaction      Medication List    STOP taking these medications   losartan-hydrochlorothiazide 100-25 MG tablet Commonly known as:  HYZAAR   nitroGLYCERIN 0.2 mg/hr patch Commonly known as:  NITRODUR - Dosed in mg/24 hr   warfarin 5 MG tablet Commonly known as:  COUMADIN     TAKE these medications   atorvastatin 40 MG tablet Commonly known  as:  LIPITOR Take 40 mg by mouth daily.   baclofen 10 MG tablet Commonly known as:  LIORESAL TAKE 1 TABLET BY MOUTH AT  BEDTIME What changed:    when to take this  reasons to take this   citalopram 40 MG tablet Commonly known as:  CELEXA Take 0.5 tablets (20 mg total) by mouth at bedtime. What changed:  how much to take   fish oil-omega-3 fatty acids 1000 MG capsule Take 1 g by mouth daily.   gabapentin 100 MG capsule Commonly known as:  NEURONTIN Take 100 mg by  mouth 2 (two) times daily.   multivitamin with minerals Tabs tablet Take 1 tablet by mouth daily.   OVER THE COUNTER MEDICATION Take 1 tablet by mouth daily. Vitamin for vision **areds**   pantoprazole 40 MG tablet Commonly known as:  PROTONIX Take 40 mg by mouth daily.   rivaroxaban 20 MG Tabs tablet Commonly known as:  XARELTO Take 1 tablet (20 mg total) by mouth daily with supper.   traZODone 50 MG tablet Commonly known as:  DESYREL Take 50 mg by mouth at bedtime.   vitamin B-12 1000 MCG tablet Commonly known as:  CYANOCOBALAMIN Take 1,000 mcg by mouth daily.      Allergies  Allergen Reactions  . Atenolol Other (See Comments)    Does not remember reaction  . Lotensin [Benazepril Hcl] Other (See Comments)    Does not remember reaction  . Wellbutrin [Bupropion] Other (See Comments)    Does not remember reaction   Follow-up Information    Deland Pretty, MD Follow up in 1 week(s).   Specialty:  Internal Medicine Contact information: [address] [place] [address] [phone]        Marcial Pacas, MD Follow up in 4 week(s).   Specialty:  Neurology Contact information: [place] [address] [phone]        [facility] Follow up.   Why:  They will contact you for the first appointment Contact information: [phone]           The results of significant diagnostics from this hospitalization (including imaging, microbiology, ancillary and laboratory) are listed below for reference.    Significant Diagnostic Studies: Ct Angio Head W Or Wo Contrast  Result Date: 04/02/2018 CLINICAL DATA:  TIA, initial exam. Personal history of CVA and traumatic brain injury. New onset of right lower extremity weakness. Patient states normal strength at 9 p.m. last evening. Weakness noticed when he woke up at 3 a.m. EXAM: CT ANGIOGRAPHY HEAD AND NECK TECHNIQUE: Multidetector CT imaging of the head and neck was performed using  the standard protocol during bolus administration of intravenous contrast. Multiplanar CT image reconstructions and MIPs were obtained to evaluate the vascular anatomy. Carotid stenosis measurements (when applicable) are obtained utilizing NASCET criteria, using the distal internal carotid diameter as the denominator. CONTRAST:  162mL ISOVUE-370 IOPAMIDOL (ISOVUE-370) INJECTION 76% COMPARISON:  MRI brain 04/02/2018 CT head without contrast 04/02/2018 FINDINGS: CTA NECK FINDINGS Aortic arch: There is a common origin of the left common carotid artery and the innominate artery. Atherosclerotic changes are present at the aortic arch. There is no aneurysm. No significant stenosis is present at the origins of the great vessels. Right carotid system: Right common carotid artery is within normal limits proximally. Distal atherosclerotic changes are present. Atherosclerotic changes are present through the carotid bifurcation without a significant stenosis. The distal cervical right ICA is normal. Left carotid system: Atherosclerotic changes  are present in the proximal and distal left common carotid artery without a significant stenosis. There is mixed calcified and soft tissue plaque in the proximal left internal carotid artery. Minimal luminal diameter is 3 mm without a significant stenosis relative to the more distal vessels. Distal cervical left ICA is normal. Vertebral arteries: Atherosclerotic calcifications are present at the origins of the vertebral arteries bilaterally. Mild narrowing is present bilaterally. The left vertebral artery is slightly dominant. There are no other significant stenoses of either vertebral artery in the neck. Skeleton: Slight retrolisthesis is present at C3-4. Multilevel degenerative changes are present throughout cervical spine. There is chronic loss of disc height C4-5, C5-6, and C6-7. No focal lytic or blastic lesions are present. Other neck: No significant coastal or submucosal lesions  are present. Salivary glands are within normal limits. There is no significant adenopathy. Vocal cords are midline and symmetric. Trachea is within normal limits. Upper chest: Centrilobular emphysematous changes are present at the lung apices bilaterally. There is no nodule or mass lesion. Thoracic at inlet is within normal limits. The upper mediastinum is within normal limits. Review of the MIP images confirms the above findings CTA HEAD FINDINGS Anterior circulation: Atherosclerotic calcifications are present within the cavernous internal carotid artery on the right without a significant luminal stenosis relative to the more distal vessel. Precavernous and cavernous calcifications are present on the left. The lumen is narrowed to 1.5 mm in the proximal left cavernous ICA. This compares with 3.6 mm more distally. No other significant stenosis is present. The right A1 segment is dominant. The anterior communicating artery is patent. M1 segments are normal bilaterally. Bifurcations are intact. Left MCA branch vessels are slightly less robust than on the right. No significant focal stenosis or occlusion is present. Posterior circulation: The left vertebral artery is the dominant vessel. Atherosclerotic calcifications are present at the dural margin without significant stenosis bilaterally. PICA origins are visualized and normal. The vertebrobasilar junction is normal. Basilar artery is within normal limits. Both posterior cerebral arteries originate from the basilar tip. There is mild narrowing of the proximal left PCA relative to the more distal vessels. Distal branch vessel narrowing is present bilaterally. Venous sinuses: The dural sinuses are patent. Straight sinus deep cerebral veins are intact. Cortical veins are within normal limits. Anatomic variants: None Delayed phase: The postcontrast images demonstrate remote lacunar infarcts of the basal ganglia bilaterally. Diffuse white matter disease is present. No  pathologic enhancement is present. Review of the MIP images confirms the above findings IMPRESSION: 1. 60% stenosis of the proximal cavernous left internal carotid artery relative to the more distal vessels. 2. Slight decreased caliber of left MCA branch vessels compared to the right. This may reflect decreased perfusion due to the proximal stenosis. 3. No emergent large vessel occlusion. 4. Extensive atherosclerotic changes as described without other focal stenosis. 5. Mild diffuse small vessel disease present within the intracranial circulation. 6. Degenerative changes of the cervical spine. 7.  Emphysema (ICD10-J43.9). Electronically Signed   By: San Morelle M.D.   On: 04/02/2018 20:25   Ct Head Wo Contrast  Result Date: 04/02/2018 CLINICAL DATA:  Right leg weakness for several hours EXAM: CT HEAD WITHOUT CONTRAST TECHNIQUE: Contiguous axial images were obtained from the base of the skull through the vertex without intravenous contrast. COMPARISON:  10/01/2016 FINDINGS: Brain: Diffuse atrophic changes and chronic white matter ischemic changes are again noted and stable. Left thalamic lacunar infarct is noted. No findings to suggest acute hemorrhage, acute infarction  or space-occupying mass lesion is noted. Vascular: No hyperdense vessel or unexpected calcification. Skull: Normal. Negative for fracture or focal lesion. Sinuses/Orbits: No acute finding. Other: None. IMPRESSION: Chronic atrophic and ischemic changes stable from the prior exam. No acute abnormality noted. Electronically Signed   By: Inez Catalina M.D.   On: 04/02/2018 12:32   Ct Angio Neck W Or Wo Contrast  Result Date: 04/02/2018 CLINICAL DATA:  TIA, initial exam. Personal history of CVA and traumatic brain injury. New onset of right lower extremity weakness. Patient states normal strength at 9 p.m. last evening. Weakness noticed when he woke up at 3 a.m. EXAM: CT ANGIOGRAPHY HEAD AND NECK TECHNIQUE: Multidetector CT imaging of the  head and neck was performed using the standard protocol during bolus administration of intravenous contrast. Multiplanar CT image reconstructions and MIPs were obtained to evaluate the vascular anatomy. Carotid stenosis measurements (when applicable) are obtained utilizing NASCET criteria, using the distal internal carotid diameter as the denominator. CONTRAST:  127mL ISOVUE-370 IOPAMIDOL (ISOVUE-370) INJECTION 76% COMPARISON:  MRI brain 04/02/2018 CT head without contrast 04/02/2018 FINDINGS: CTA NECK FINDINGS Aortic arch: There is a common origin of the left common carotid artery and the innominate artery. Atherosclerotic changes are present at the aortic arch. There is no aneurysm. No significant stenosis is present at the origins of the great vessels. Right carotid system: Right common carotid artery is within normal limits proximally. Distal atherosclerotic changes are present. Atherosclerotic changes are present through the carotid bifurcation without a significant stenosis. The distal cervical right ICA is normal. Left carotid system: Atherosclerotic changes are present in the proximal and distal left common carotid artery without a significant stenosis. There is mixed calcified and soft tissue plaque in the proximal left internal carotid artery. Minimal luminal diameter is 3 mm without a significant stenosis relative to the more distal vessels. Distal cervical left ICA is normal. Vertebral arteries: Atherosclerotic calcifications are present at the origins of the vertebral arteries bilaterally. Mild narrowing is present bilaterally. The left vertebral artery is slightly dominant. There are no other significant stenoses of either vertebral artery in the neck. Skeleton: Slight retrolisthesis is present at C3-4. Multilevel degenerative changes are present throughout cervical spine. There is chronic loss of disc height C4-5, C5-6, and C6-7. No focal lytic or blastic lesions are present. Other neck: No  significant coastal or submucosal lesions are present. Salivary glands are within normal limits. There is no significant adenopathy. Vocal cords are midline and symmetric. Trachea is within normal limits. Upper chest: Centrilobular emphysematous changes are present at the lung apices bilaterally. There is no nodule or mass lesion. Thoracic at inlet is within normal limits. The upper mediastinum is within normal limits. Review of the MIP images confirms the above findings CTA HEAD FINDINGS Anterior circulation: Atherosclerotic calcifications are present within the cavernous internal carotid artery on the right without a significant luminal stenosis relative to the more distal vessel. Precavernous and cavernous calcifications are present on the left. The lumen is narrowed to 1.5 mm in the proximal left cavernous ICA. This compares with 3.6 mm more distally. No other significant stenosis is present. The right A1 segment is dominant. The anterior communicating artery is patent. M1 segments are normal bilaterally. Bifurcations are intact. Left MCA branch vessels are slightly less robust than on the right. No significant focal stenosis or occlusion is present. Posterior circulation: The left vertebral artery is the dominant vessel. Atherosclerotic calcifications are present at the dural margin without significant stenosis bilaterally. PICA origins  are visualized and normal. The vertebrobasilar junction is normal. Basilar artery is within normal limits. Both posterior cerebral arteries originate from the basilar tip. There is mild narrowing of the proximal left PCA relative to the more distal vessels. Distal branch vessel narrowing is present bilaterally. Venous sinuses: The dural sinuses are patent. Straight sinus deep cerebral veins are intact. Cortical veins are within normal limits. Anatomic variants: None Delayed phase: The postcontrast images demonstrate remote lacunar infarcts of the basal ganglia bilaterally.  Diffuse white matter disease is present. No pathologic enhancement is present. Review of the MIP images confirms the above findings IMPRESSION: 1. 60% stenosis of the proximal cavernous left internal carotid artery relative to the more distal vessels. 2. Slight decreased caliber of left MCA branch vessels compared to the right. This may reflect decreased perfusion due to the proximal stenosis. 3. No emergent large vessel occlusion. 4. Extensive atherosclerotic changes as described without other focal stenosis. 5. Mild diffuse small vessel disease present within the intracranial circulation. 6. Degenerative changes of the cervical spine. 7.  Emphysema (ICD10-J43.9). Electronically Signed   By: San Morelle M.D.   On: 04/02/2018 20:25   Mr Brain Wo Contrast  Result Date: 04/02/2018 CLINICAL DATA:  Right leg weakness. EXAM: MRI HEAD WITHOUT CONTRAST TECHNIQUE: Multiplanar, multiecho pulse sequences of the brain and surrounding structures were obtained without intravenous contrast. COMPARISON:  CT head 04/02/2018 FINDINGS: Brain: Negative for acute infarct. Moderate to advanced cortical atrophy. Extensive chronic ischemic change throughout the white matter and basal ganglia bilaterally. Chronic ischemia in the pons. Negative for mass lesion. Chronic hemorrhage right external capsule. No midline shift. Negative for hydrocephalus. Vascular: Normal arterial flow voids Skull and upper cervical spine: Negative Sinuses/Orbits: Bilateral cataract surgery.  Paranasal sinuses clear Other: None IMPRESSION: Negative for acute infarct Moderate to advanced atrophy with advanced chronic microvascular ischemia. Electronically Signed   By: Franchot Gallo M.D.   On: 04/02/2018 16:03   Vas [place] Lower Extremity Venous (dvt)  Result Date: 04/04/2018  Lower Venous Study Indications: Pain. Other Indications: Subtherapeutic INR. Limitations: Body habitus and Slow breathing. Comparison Study: No prior study on file for comparison  Performing Technologist: Sharion Dove RVS  Examination Guidelines: A complete evaluation includes B-mode imaging, spectral Doppler, color Doppler, and power Doppler as needed of all accessible portions of each vessel. Bilateral testing is considered an integral part of a complete examination. Limited examinations for reoccurring indications may be performed as noted.  Right Venous Findings: +---------+---------------+---------+-----------+----------+-------+          CompressibilityPhasicitySpontaneityPropertiesSummary +---------+---------------+---------+-----------+----------+-------+ CFV      Full           Yes      Yes                          +---------+---------------+---------+-----------+----------+-------+ SFJ      Full                                                 +---------+---------------+---------+-----------+----------+-------+ FV Prox  Full                                                 +---------+---------------+---------+-----------+----------+-------+ FV Mid   Full                                                 +---------+---------------+---------+-----------+----------+-------+  FV DistalFull                                                 +---------+---------------+---------+-----------+----------+-------+ PFV      Full                                                 +---------+---------------+---------+-----------+----------+-------+ POP      Full           Yes      Yes                          +---------+---------------+---------+-----------+----------+-------+ PTV      Full                                                 +---------+---------------+---------+-----------+----------+-------+ PERO     Full                                                 +---------+---------------+---------+-----------+----------+-------+  Left Venous Findings: +---------+---------------+---------+-----------+----------+-------+           CompressibilityPhasicitySpontaneityPropertiesSummary +---------+---------------+---------+-----------+----------+-------+ CFV      Full           Yes      Yes                          +---------+---------------+---------+-----------+----------+-------+ SFJ      Full                                                 +---------+---------------+---------+-----------+----------+-------+ FV Prox  Full                                                 +---------+---------------+---------+-----------+----------+-------+ FV Mid   Full                                                 +---------+---------------+---------+-----------+----------+-------+ FV DistalFull                                                 +---------+---------------+---------+-----------+----------+-------+ PFV      Full                                                 +---------+---------------+---------+-----------+----------+-------+  POP      Full           Yes      Yes                          +---------+---------------+---------+-----------+----------+-------+ PTV      Full                                                 +---------+---------------+---------+-----------+----------+-------+ PERO     Full                                                 +---------+---------------+---------+-----------+----------+-------+    Summary: Right: There is no evidence of deep vein thrombosis in the lower extremity. Left: There is no evidence of deep vein thrombosis in the lower extremity.  *See table(s) above for measurements and observations. Electronically signed by Monica Martinez MD on 04/04/2018 at 10:00:03 AM.    Final     Microbiology: No results found for this or any previous visit (from the past 240 hour(s)).   Labs: Basic Metabolic Panel: Recent Labs  Lab 04/02/18 1127 04/03/18 0539 04/04/18 0533 04/05/18 0857  NA 139 141 138 137  K 4.1 3.4* 3.2* 3.6  CL 106 108 104 105   CO2 23 20* 25 23  GLUCOSE 98 103* 105* 147*  BUN 18 20 23  30*  CREATININE 1.53* 1.46* 1.71* 1.81*  CALCIUM 9.6 9.1 9.0 8.8*   Liver Function Tests: Recent Labs  Lab 04/02/18 1127  AST 31  ALT 23  ALKPHOS 71  BILITOT 3.1*  PROT 7.4  ALBUMIN 3.9   No results for input(s): LIPASE, AMYLASE in the last 168 hours. No results for input(s): AMMONIA in the last 168 hours. CBC: Recent Labs  Lab 04/02/18 1127 04/04/18 0533  WBC 10.4 10.3  NEUTROABS 8.8*  --   HGB 13.7 12.4*  HCT 42.0 38.1*  MCV 95.2 94.3  PLT 249 222   Cardiac Enzymes: Recent Labs  Lab 04/03/18 0539  CKTOTAL 246   BNP: BNP (last 3 results) No results for input(s): BNP in the last 8760 hours.  ProBNP (last 3 results) No results for input(s): PROBNP in the last 8760 hours.  CBG: No results for input(s): GLUCAP in the last 168 hours.     SignedRadene Gunning NP [facility] 04/05/2018, 1:05 PM

## 2018-04-05 NOTE — Progress Notes (Signed)
Patient ready for discharge to home; discharge instructions given and reviewed; Rx sent electronically; wife present at bedside; patient dressed and ready to discharge via wheelchair; voluntary services will take patient out via wheelchair; patient will be accompanied home by his wife.

## 2018-04-06 NOTE — Consult Note (Signed)
[facility] CM Primary Care Navigator  04/06/2018  Steven Guerrero February 12, 1941 [id number]   Attemptto seepatient at the bedside to identify possible discharge needs buthe was already discharged. Patient went home with home health services per therapy recommendation.  Per MD note,patient presented with worsening right leg weakness thought to be from TIA due to low INR. Coumadin changed to Xarelto. MRI were negative for signs of acute stroke. (essential HTN, hypokalemia, acute kidney disease)  Patient has discharge instruction tofollow-up withprimary care provider in 1 week and neurology follow-up in 4 weeks.  Primary care provider's office is listed as providing transition of care (TOC).   For additional questions please contact:  Karin Golden A. Narmeen Kerper, BSN, RN-BC [facility] PRIMARY CARE Navigator Cell: [phone]

## 2018-04-07 DIAGNOSIS — N183 Chronic kidney disease, stage 3 (moderate): Secondary | ICD-10-CM | POA: Diagnosis not present

## 2018-04-07 DIAGNOSIS — G629 Polyneuropathy, unspecified: Secondary | ICD-10-CM | POA: Diagnosis not present

## 2018-04-07 DIAGNOSIS — Z9181 History of falling: Secondary | ICD-10-CM | POA: Diagnosis not present

## 2018-04-07 DIAGNOSIS — I129 Hypertensive chronic kidney disease with stage 1 through stage 4 chronic kidney disease, or unspecified chronic kidney disease: Secondary | ICD-10-CM | POA: Diagnosis not present

## 2018-04-07 DIAGNOSIS — G4733 Obstructive sleep apnea (adult) (pediatric): Secondary | ICD-10-CM | POA: Diagnosis not present

## 2018-04-07 DIAGNOSIS — M545 Low back pain: Secondary | ICD-10-CM | POA: Diagnosis not present

## 2018-04-07 DIAGNOSIS — Z87891 Personal history of nicotine dependence: Secondary | ICD-10-CM | POA: Diagnosis not present

## 2018-04-07 DIAGNOSIS — I69351 Hemiplegia and hemiparesis following cerebral infarction affecting right dominant side: Secondary | ICD-10-CM | POA: Diagnosis not present

## 2018-04-07 DIAGNOSIS — Z96641 Presence of right artificial hip joint: Secondary | ICD-10-CM | POA: Diagnosis not present

## 2018-04-07 DIAGNOSIS — Z86711 Personal history of pulmonary embolism: Secondary | ICD-10-CM | POA: Diagnosis not present

## 2018-04-08 DIAGNOSIS — E876 Hypokalemia: Secondary | ICD-10-CM | POA: Diagnosis not present

## 2018-04-08 DIAGNOSIS — R29898 Other symptoms and signs involving the musculoskeletal system: Secondary | ICD-10-CM | POA: Diagnosis not present

## 2018-04-09 DIAGNOSIS — M545 Low back pain: Secondary | ICD-10-CM | POA: Diagnosis not present

## 2018-04-09 DIAGNOSIS — I69351 Hemiplegia and hemiparesis following cerebral infarction affecting right dominant side: Secondary | ICD-10-CM | POA: Diagnosis not present

## 2018-04-09 DIAGNOSIS — Z86711 Personal history of pulmonary embolism: Secondary | ICD-10-CM | POA: Diagnosis not present

## 2018-04-09 DIAGNOSIS — Z9181 History of falling: Secondary | ICD-10-CM | POA: Diagnosis not present

## 2018-04-09 DIAGNOSIS — Z87891 Personal history of nicotine dependence: Secondary | ICD-10-CM | POA: Diagnosis not present

## 2018-04-09 DIAGNOSIS — G629 Polyneuropathy, unspecified: Secondary | ICD-10-CM | POA: Diagnosis not present

## 2018-04-09 DIAGNOSIS — N183 Chronic kidney disease, stage 3 (moderate): Secondary | ICD-10-CM | POA: Diagnosis not present

## 2018-04-09 DIAGNOSIS — G4733 Obstructive sleep apnea (adult) (pediatric): Secondary | ICD-10-CM | POA: Diagnosis not present

## 2018-04-09 DIAGNOSIS — Z96641 Presence of right artificial hip joint: Secondary | ICD-10-CM | POA: Diagnosis not present

## 2018-04-09 DIAGNOSIS — I129 Hypertensive chronic kidney disease with stage 1 through stage 4 chronic kidney disease, or unspecified chronic kidney disease: Secondary | ICD-10-CM | POA: Diagnosis not present

## 2018-04-13 DIAGNOSIS — N183 Chronic kidney disease, stage 3 (moderate): Secondary | ICD-10-CM | POA: Diagnosis not present

## 2018-04-13 DIAGNOSIS — G629 Polyneuropathy, unspecified: Secondary | ICD-10-CM | POA: Diagnosis not present

## 2018-04-13 DIAGNOSIS — I69351 Hemiplegia and hemiparesis following cerebral infarction affecting right dominant side: Secondary | ICD-10-CM | POA: Diagnosis not present

## 2018-04-13 DIAGNOSIS — Z86711 Personal history of pulmonary embolism: Secondary | ICD-10-CM | POA: Diagnosis not present

## 2018-04-13 DIAGNOSIS — Z9181 History of falling: Secondary | ICD-10-CM | POA: Diagnosis not present

## 2018-04-13 DIAGNOSIS — Z96641 Presence of right artificial hip joint: Secondary | ICD-10-CM | POA: Diagnosis not present

## 2018-04-13 DIAGNOSIS — G4733 Obstructive sleep apnea (adult) (pediatric): Secondary | ICD-10-CM | POA: Diagnosis not present

## 2018-04-13 DIAGNOSIS — Z87891 Personal history of nicotine dependence: Secondary | ICD-10-CM | POA: Diagnosis not present

## 2018-04-13 DIAGNOSIS — M545 Low back pain: Secondary | ICD-10-CM | POA: Diagnosis not present

## 2018-04-13 DIAGNOSIS — I129 Hypertensive chronic kidney disease with stage 1 through stage 4 chronic kidney disease, or unspecified chronic kidney disease: Secondary | ICD-10-CM | POA: Diagnosis not present

## 2018-04-15 DIAGNOSIS — N183 Chronic kidney disease, stage 3 (moderate): Secondary | ICD-10-CM | POA: Diagnosis not present

## 2018-04-15 DIAGNOSIS — Z9181 History of falling: Secondary | ICD-10-CM | POA: Diagnosis not present

## 2018-04-15 DIAGNOSIS — M545 Low back pain: Secondary | ICD-10-CM | POA: Diagnosis not present

## 2018-04-15 DIAGNOSIS — Z87891 Personal history of nicotine dependence: Secondary | ICD-10-CM | POA: Diagnosis not present

## 2018-04-15 DIAGNOSIS — I129 Hypertensive chronic kidney disease with stage 1 through stage 4 chronic kidney disease, or unspecified chronic kidney disease: Secondary | ICD-10-CM | POA: Diagnosis not present

## 2018-04-15 DIAGNOSIS — G4733 Obstructive sleep apnea (adult) (pediatric): Secondary | ICD-10-CM | POA: Diagnosis not present

## 2018-04-15 DIAGNOSIS — G629 Polyneuropathy, unspecified: Secondary | ICD-10-CM | POA: Diagnosis not present

## 2018-04-15 DIAGNOSIS — I69351 Hemiplegia and hemiparesis following cerebral infarction affecting right dominant side: Secondary | ICD-10-CM | POA: Diagnosis not present

## 2018-04-15 DIAGNOSIS — Z86711 Personal history of pulmonary embolism: Secondary | ICD-10-CM | POA: Diagnosis not present

## 2018-04-15 DIAGNOSIS — Z96641 Presence of right artificial hip joint: Secondary | ICD-10-CM | POA: Diagnosis not present

## 2018-04-16 DIAGNOSIS — I69351 Hemiplegia and hemiparesis following cerebral infarction affecting right dominant side: Secondary | ICD-10-CM | POA: Diagnosis not present

## 2018-04-16 DIAGNOSIS — G629 Polyneuropathy, unspecified: Secondary | ICD-10-CM | POA: Diagnosis not present

## 2018-04-16 DIAGNOSIS — I129 Hypertensive chronic kidney disease with stage 1 through stage 4 chronic kidney disease, or unspecified chronic kidney disease: Secondary | ICD-10-CM | POA: Diagnosis not present

## 2018-04-16 DIAGNOSIS — N183 Chronic kidney disease, stage 3 (moderate): Secondary | ICD-10-CM | POA: Diagnosis not present

## 2018-04-19 DIAGNOSIS — Z87891 Personal history of nicotine dependence: Secondary | ICD-10-CM | POA: Diagnosis not present

## 2018-04-19 DIAGNOSIS — Z9181 History of falling: Secondary | ICD-10-CM | POA: Diagnosis not present

## 2018-04-19 DIAGNOSIS — M545 Low back pain: Secondary | ICD-10-CM | POA: Diagnosis not present

## 2018-04-19 DIAGNOSIS — G629 Polyneuropathy, unspecified: Secondary | ICD-10-CM | POA: Diagnosis not present

## 2018-04-19 DIAGNOSIS — Z86711 Personal history of pulmonary embolism: Secondary | ICD-10-CM | POA: Diagnosis not present

## 2018-04-19 DIAGNOSIS — I69351 Hemiplegia and hemiparesis following cerebral infarction affecting right dominant side: Secondary | ICD-10-CM | POA: Diagnosis not present

## 2018-04-19 DIAGNOSIS — N183 Chronic kidney disease, stage 3 (moderate): Secondary | ICD-10-CM | POA: Diagnosis not present

## 2018-04-19 DIAGNOSIS — G4733 Obstructive sleep apnea (adult) (pediatric): Secondary | ICD-10-CM | POA: Diagnosis not present

## 2018-04-19 DIAGNOSIS — I129 Hypertensive chronic kidney disease with stage 1 through stage 4 chronic kidney disease, or unspecified chronic kidney disease: Secondary | ICD-10-CM | POA: Diagnosis not present

## 2018-04-19 DIAGNOSIS — Z96641 Presence of right artificial hip joint: Secondary | ICD-10-CM | POA: Diagnosis not present

## 2018-04-22 DIAGNOSIS — I129 Hypertensive chronic kidney disease with stage 1 through stage 4 chronic kidney disease, or unspecified chronic kidney disease: Secondary | ICD-10-CM | POA: Diagnosis not present

## 2018-04-22 DIAGNOSIS — I10 Essential (primary) hypertension: Secondary | ICD-10-CM | POA: Diagnosis not present

## 2018-04-22 DIAGNOSIS — Z7901 Long term (current) use of anticoagulants: Secondary | ICD-10-CM | POA: Diagnosis not present

## 2018-04-22 DIAGNOSIS — Z9181 History of falling: Secondary | ICD-10-CM | POA: Diagnosis not present

## 2018-04-22 DIAGNOSIS — Z86711 Personal history of pulmonary embolism: Secondary | ICD-10-CM | POA: Diagnosis not present

## 2018-04-22 DIAGNOSIS — Z96641 Presence of right artificial hip joint: Secondary | ICD-10-CM | POA: Diagnosis not present

## 2018-04-22 DIAGNOSIS — Z87891 Personal history of nicotine dependence: Secondary | ICD-10-CM | POA: Diagnosis not present

## 2018-04-22 DIAGNOSIS — Z86718 Personal history of other venous thrombosis and embolism: Secondary | ICD-10-CM | POA: Diagnosis not present

## 2018-04-22 DIAGNOSIS — I69351 Hemiplegia and hemiparesis following cerebral infarction affecting right dominant side: Secondary | ICD-10-CM | POA: Diagnosis not present

## 2018-04-22 DIAGNOSIS — M545 Low back pain: Secondary | ICD-10-CM | POA: Diagnosis not present

## 2018-04-22 DIAGNOSIS — N183 Chronic kidney disease, stage 3 (moderate): Secondary | ICD-10-CM | POA: Diagnosis not present

## 2018-04-22 DIAGNOSIS — G629 Polyneuropathy, unspecified: Secondary | ICD-10-CM | POA: Diagnosis not present

## 2018-04-22 DIAGNOSIS — E7800 Pure hypercholesterolemia, unspecified: Secondary | ICD-10-CM | POA: Diagnosis not present

## 2018-04-22 DIAGNOSIS — G4733 Obstructive sleep apnea (adult) (pediatric): Secondary | ICD-10-CM | POA: Diagnosis not present

## 2018-05-10 DIAGNOSIS — Z1212 Encounter for screening for malignant neoplasm of rectum: Secondary | ICD-10-CM | POA: Diagnosis not present

## 2018-05-10 DIAGNOSIS — G4733 Obstructive sleep apnea (adult) (pediatric): Secondary | ICD-10-CM | POA: Diagnosis not present

## 2018-05-10 DIAGNOSIS — L570 Actinic keratosis: Secondary | ICD-10-CM | POA: Diagnosis not present

## 2018-05-10 DIAGNOSIS — N183 Chronic kidney disease, stage 3 (moderate): Secondary | ICD-10-CM | POA: Diagnosis not present

## 2018-05-10 DIAGNOSIS — R600 Localized edema: Secondary | ICD-10-CM | POA: Diagnosis not present

## 2018-05-10 DIAGNOSIS — I6529 Occlusion and stenosis of unspecified carotid artery: Secondary | ICD-10-CM | POA: Diagnosis not present

## 2018-05-17 DIAGNOSIS — I10 Essential (primary) hypertension: Secondary | ICD-10-CM | POA: Diagnosis not present

## 2018-05-17 DIAGNOSIS — G629 Polyneuropathy, unspecified: Secondary | ICD-10-CM | POA: Diagnosis not present

## 2018-05-17 DIAGNOSIS — E7800 Pure hypercholesterolemia, unspecified: Secondary | ICD-10-CM | POA: Diagnosis not present

## 2018-05-17 DIAGNOSIS — Z86718 Personal history of other venous thrombosis and embolism: Secondary | ICD-10-CM | POA: Diagnosis not present

## 2018-05-17 DIAGNOSIS — N183 Chronic kidney disease, stage 3 (moderate): Secondary | ICD-10-CM | POA: Diagnosis not present

## 2018-05-31 DIAGNOSIS — E7800 Pure hypercholesterolemia, unspecified: Secondary | ICD-10-CM | POA: Diagnosis not present

## 2018-05-31 DIAGNOSIS — G629 Polyneuropathy, unspecified: Secondary | ICD-10-CM | POA: Diagnosis not present

## 2018-05-31 DIAGNOSIS — I10 Essential (primary) hypertension: Secondary | ICD-10-CM | POA: Diagnosis not present

## 2018-05-31 DIAGNOSIS — N183 Chronic kidney disease, stage 3 (moderate): Secondary | ICD-10-CM | POA: Diagnosis not present

## 2018-05-31 DIAGNOSIS — Z86718 Personal history of other venous thrombosis and embolism: Secondary | ICD-10-CM | POA: Diagnosis not present

## 2018-06-03 DIAGNOSIS — K219 Gastro-esophageal reflux disease without esophagitis: Secondary | ICD-10-CM | POA: Diagnosis not present

## 2018-06-03 DIAGNOSIS — I10 Essential (primary) hypertension: Secondary | ICD-10-CM | POA: Diagnosis not present

## 2018-06-07 DIAGNOSIS — H52223 Regular astigmatism, bilateral: Secondary | ICD-10-CM | POA: Diagnosis not present

## 2018-06-07 DIAGNOSIS — H353131 Nonexudative age-related macular degeneration, bilateral, early dry stage: Secondary | ICD-10-CM | POA: Diagnosis not present

## 2018-06-22 DIAGNOSIS — I10 Essential (primary) hypertension: Secondary | ICD-10-CM | POA: Diagnosis not present

## 2018-06-22 DIAGNOSIS — H6123 Impacted cerumen, bilateral: Secondary | ICD-10-CM | POA: Diagnosis not present

## 2018-06-22 DIAGNOSIS — Z86718 Personal history of other venous thrombosis and embolism: Secondary | ICD-10-CM | POA: Diagnosis not present

## 2018-06-22 DIAGNOSIS — N183 Chronic kidney disease, stage 3 (moderate): Secondary | ICD-10-CM | POA: Diagnosis not present

## 2018-08-18 ENCOUNTER — Other Ambulatory Visit: Payer: Self-pay | Admitting: Sports Medicine

## 2018-08-18 DIAGNOSIS — G8929 Other chronic pain: Secondary | ICD-10-CM

## 2018-09-21 DIAGNOSIS — N183 Chronic kidney disease, stage 3 (moderate): Secondary | ICD-10-CM | POA: Diagnosis not present

## 2018-09-21 DIAGNOSIS — E7800 Pure hypercholesterolemia, unspecified: Secondary | ICD-10-CM | POA: Diagnosis not present

## 2018-09-21 DIAGNOSIS — I10 Essential (primary) hypertension: Secondary | ICD-10-CM | POA: Diagnosis not present

## 2018-09-21 DIAGNOSIS — Z86718 Personal history of other venous thrombosis and embolism: Secondary | ICD-10-CM | POA: Diagnosis not present

## 2018-09-21 DIAGNOSIS — Z7901 Long term (current) use of anticoagulants: Secondary | ICD-10-CM | POA: Diagnosis not present

## 2018-09-29 ENCOUNTER — Other Ambulatory Visit: Payer: Self-pay

## 2018-09-29 ENCOUNTER — Encounter (HOSPITAL_COMMUNITY): Payer: Self-pay | Admitting: Emergency Medicine

## 2018-09-29 ENCOUNTER — Emergency Department (HOSPITAL_COMMUNITY)
Admission: EM | Admit: 2018-09-29 | Discharge: 2018-09-29 | Disposition: A | Discharge status: Home / Self Care | Payer: Medicare Other | Attending: Emergency Medicine | Admitting: Emergency Medicine

## 2018-09-29 ENCOUNTER — Emergency Department (HOSPITAL_COMMUNITY): Payer: Medicare Other

## 2018-09-29 DIAGNOSIS — Z8673 Personal history of transient ischemic attack (TIA), and cerebral infarction without residual deficits: Secondary | ICD-10-CM | POA: Diagnosis not present

## 2018-09-29 DIAGNOSIS — Z87891 Personal history of nicotine dependence: Secondary | ICD-10-CM | POA: Diagnosis not present

## 2018-09-29 DIAGNOSIS — I10 Essential (primary) hypertension: Secondary | ICD-10-CM | POA: Diagnosis not present

## 2018-09-29 DIAGNOSIS — R404 Transient alteration of awareness: Secondary | ICD-10-CM | POA: Diagnosis not present

## 2018-09-29 DIAGNOSIS — W1809XA Striking against other object with subsequent fall, initial encounter: Secondary | ICD-10-CM | POA: Diagnosis not present

## 2018-09-29 DIAGNOSIS — Y9289 Other specified places as the place of occurrence of the external cause: Secondary | ICD-10-CM | POA: Diagnosis not present

## 2018-09-29 DIAGNOSIS — S0990XA Unspecified injury of head, initial encounter: Secondary | ICD-10-CM | POA: Diagnosis not present

## 2018-09-29 DIAGNOSIS — Z0489 Encounter for examination and observation for other specified reasons: Secondary | ICD-10-CM | POA: Insufficient documentation

## 2018-09-29 DIAGNOSIS — Y9389 Activity, other specified: Secondary | ICD-10-CM | POA: Diagnosis not present

## 2018-09-29 DIAGNOSIS — R61 Generalized hyperhidrosis: Secondary | ICD-10-CM | POA: Diagnosis not present

## 2018-09-29 DIAGNOSIS — W19XXXA Unspecified fall, initial encounter: Secondary | ICD-10-CM

## 2018-09-29 DIAGNOSIS — R0902 Hypoxemia: Secondary | ICD-10-CM | POA: Diagnosis not present

## 2018-09-29 DIAGNOSIS — Z96641 Presence of right artificial hip joint: Secondary | ICD-10-CM | POA: Diagnosis not present

## 2018-09-29 DIAGNOSIS — Y999 Unspecified external cause status: Secondary | ICD-10-CM | POA: Insufficient documentation

## 2018-09-29 DIAGNOSIS — Z79899 Other long term (current) drug therapy: Secondary | ICD-10-CM | POA: Insufficient documentation

## 2018-09-29 DIAGNOSIS — R410 Disorientation, unspecified: Secondary | ICD-10-CM | POA: Insufficient documentation

## 2018-09-29 DIAGNOSIS — I959 Hypotension, unspecified: Secondary | ICD-10-CM | POA: Diagnosis not present

## 2018-09-29 NOTE — ED Provider Notes (Signed)
[place] EMERGENCY DEPARTMENT Provider Note   CSN: [id number] Arrival date & time: 09/29/18  1659    History   Chief Complaint Chief Complaint  Patient presents with  . Fall    HPI Steven Guerrero is a 78 y.o. male.     HPI   78yo male with history below presents with concern for mechanical fall and sitting in the heat.  Reports he was in a normal state of health when he walked out to the patio and his leg hit one of the chairs and he fell down.  Wife reports he may have been out in the heat for approximately 30 minutes. When EMS arrived they measured a temperature of 101.7 and wife reports they brought him here with concern for heat stroke.  Patient reports he feels at baseline now. Denies chest pain, shortness of breath, lightheadedness, syncope, new numbness or weakness.  Denies fevers or infectious symptoms. Denies neck pain, headache LOC>   Past Medical History:  Diagnosis Date  . Anxiety and depression   . Back pain   . Brain bleed ([place])    2003  . CVA (cerebral infarction)    2001  . Hypercholesteremia   . Hypertension   . Peripheral neuropathy   . Pulmonary embolism ([place])   . Sleep apnea   . Stroke ([place])    2001  . TBI (traumatic brain injury) [facility])     Patient Active Problem List   Diagnosis Date Noted  . Bilateral leg pain 04/04/2018  . TIA (transient ischemic attack) 04/02/2018  . Essential hypertension 04/02/2018  . History of stroke 04/02/2018  . History of pulmonary embolus (PE) 04/02/2018  . H/O total hip arthroplasty 10/09/2016  . Laceration of right forearm 10/09/2016  . Long term current use of anticoagulant 02/08/2016  . Right hip pain 09/12/2015  . Abnormality of gait 08/28/2015  . Head trauma 08/28/2015  . Stroke ([place]) 08/28/2015  . Neck pain 08/28/2015  . Low back pain 08/28/2015    Past Surgical History:  Procedure Laterality Date  . HERNIA REPAIR    . JOINT REPLACEMENT Right    hip replacement  . LEG SURGERY      Broken femur  . TOTAL HIP ARTHROPLASTY     Right        Home Medications    Prior to Admission medications   Medication Sig Start Date End Date Taking? Authorizing Provider  atorvastatin (LIPITOR) 40 MG tablet Take 40 mg by mouth daily.    [provider]  baclofen (LIORESAL) 10 MG tablet TAKE 1 TABLET BY MOUTH AT  BEDTIME Patient taking differently: Take 10 mg by mouth at bedtime as needed for muscle spasms.  10/20/16   Gerda Diss, DO  citalopram (CELEXA) 40 MG tablet Take 0.5 tablets (20 mg total) by mouth at bedtime. 04/05/18   Geradine Girt, DO  fish oil-omega-3 fatty acids 1000 MG capsule Take 1 g by mouth daily.    [provider]  gabapentin (NEURONTIN) 100 MG capsule Take 100 mg by mouth 2 (two) times daily.  08/09/15   [provider]  Multiple Vitamin (MULTIVITAMIN WITH MINERALS) TABS tablet Take 1 tablet by mouth daily.    [provider]  OVER THE COUNTER MEDICATION Take 1 tablet by mouth daily. Vitamin for vision **areds**    [provider]  pantoprazole (PROTONIX) 40 MG tablet Take 40 mg by mouth daily.  02/15/16   [provider]  rivaroxaban (  XARELTO) 20 MG TABS tablet Take 1 tablet (20 mg total) by mouth daily with supper. 04/05/18   Geradine Girt, DO  traZODone (DESYREL) 50 MG tablet Take 50 mg by mouth at bedtime.    [provider]  vitamin B-12 (CYANOCOBALAMIN) 1000 MCG tablet Take 1,000 mcg by mouth daily.    [provider]    Family History Family History  Problem Relation Age of Onset  . Heart attack Father        Mother - health hx unknown    Social History Social History   Tobacco Use  . Smoking status: Former Smoker    Packs/day: 1.50    Years: 47.00    Pack years: 70.50    Types: Cigarettes    Quit date: 03/24/2006    Years since quitting: 12.5  . Smokeless tobacco: Never Used  Substance Use Topics  . Alcohol use: Yes    Alcohol/week: 0.0 standard drinks     Comment: 1 beer per day  . Drug use: No     Allergies   Atenolol, Lotensin [benazepril hcl], and Wellbutrin [bupropion]   Review of Systems Review of Systems  Constitutional: Negative for fever.  HENT: Negative for sore throat.   Eyes: Negative for visual disturbance.  Respiratory: Negative for cough and shortness of breath.   Cardiovascular: Negative for chest pain.  Gastrointestinal: Negative for abdominal pain, nausea and vomiting.  Genitourinary: Negative for difficulty urinating.  Musculoskeletal: Negative for back pain and neck stiffness.  Skin: Negative for rash.  Neurological: Negative for syncope and headaches.     Physical Exam Updated Vital Signs BP 132/66   Pulse 71   Temp 98.1 F (36.7 C) (Oral)   Resp (!) 21   SpO2 92%   Physical Exam Vitals signs and nursing note reviewed.  Constitutional:      General: He is not in acute distress.    Appearance: He is well-developed. He is not diaphoretic.  HENT:     Head: Normocephalic and atraumatic.  Eyes:     Conjunctiva/sclera: Conjunctivae normal.  Neck:     Musculoskeletal: Normal range of motion.  Cardiovascular:     Rate and Rhythm: Normal rate and regular rhythm.     Heart sounds: Normal heart sounds. No murmur. No friction rub. No gallop.   Pulmonary:     Effort: Pulmonary effort is normal. No respiratory distress.     Breath sounds: Normal breath sounds. No wheezing or rales.  Abdominal:     General: There is no distension.     Palpations: Abdomen is soft.     Tenderness: There is no abdominal tenderness. There is no guarding.  Musculoskeletal:     Cervical back: He exhibits no bony tenderness.     Thoracic back: He exhibits no bony tenderness.     Lumbar back: He exhibits no bony tenderness.  Skin:    General: Skin is warm and dry.  Neurological:     Mental Status: He is alert and oriented to person, place, and time.     GCS: GCS eye subscore is 4. GCS verbal subscore is 5. GCS motor subscore  is 6.     Sensory: Sensation is intact.     Motor: Motor function is intact.      ED Treatments / Results  Labs (all labs ordered are listed, but only abnormal results are displayed) Labs Reviewed - No data to display  EKG None  Radiology Ct Head Wo Contrast  Result  Date: 09/29/2018 CLINICAL DATA:  Ct head wo, Pt arrived [facility] from home s/p fall at unknown time. Pt wife found him sitting on the back steps at 1530. GCS 14 disoriented to time at baseline, hx of CVA with L sided residual weakness per EMS EXAM: CT HEAD WITHOUT CONTRAST TECHNIQUE: Contiguous axial images were obtained from the base of the skull through the vertex without intravenous contrast. COMPARISON:  Head CT, 04/02/2018 FINDINGS: Brain: No evidence of acute infarction, hemorrhage, hydrocephalus, extra-axial collection or mass lesion/mass effect. There is ventricular sulcal enlargement reflecting moderate generalized atrophy. Multiple old basal gangliar lacunar infarcts are noted with several additional deep white matter lacunar infarcts. Patchy white matter hypoattenuation is noted bilaterally consistent with moderate chronic microvascular ischemic change. These findings are stable. Vascular: No hyperdense vessel or unexpected calcification. Skull: Normal. Negative for fracture or focal lesion. Sinuses/Orbits: Globes and orbits are unremarkable. Minor ethmoid and maxillary sinus mucosal thickening. Other: None. IMPRESSION: 1. No acute intracranial abnormalities. 2. Atrophy, chronic microvascular ischemic change and multiple old lacunar infarcts, stable from the prior CT. Electronically Signed   By: Lajean Manes M.D.   On: 09/29/2018 18:27   Dg Chest Portable 1 View  Result Date: 09/29/2018 CLINICAL DATA:  Hypoxia EXAM: PORTABLE CHEST 1 VIEW COMPARISON:  CT from 04/03/2017 FINDINGS: Cardiac shadows within normal limits. Aortic calcifications are seen. Multiple calcified pleural plaques are seen bilaterally. No focal infiltrate or  sizable effusion is noted. No acute bony abnormality is seen. IMPRESSION: Chronic calcified pleural plaques.  No acute abnormality noted. Electronically Signed   By: Inez Catalina M.D.   On: 09/29/2018 19:33    Procedures Procedures (including critical care time)  Medications Ordered in ED Medications - No data to display   Initial Impression / Assessment and Plan / ED Course  I have reviewed the triage vital signs and the nursing notes.  Pertinent labs & imaging results that were available during my care of the patient were reviewed by me and considered in my medical decision making (see chart for details).       78yo male with history listed above presents with concern for mechanical fall. Patient on xarelto, CT head shows no evidence of intracranial abnormalities. Patient declines CSpine CT offered due to [employer], athough he is lower risk by NEXUS criteria.  Given patient is at baseline without any medical concerns, was not outside for prolonged period of time, do not feel labwork is indicated at this time. Temperature was reported elevated by EMS but is normal on arrival here after minimal fluids. O2 saturations were found to be 89% on arrival, however CXR normal, he has no respiratory complaints and was monitored off O2 with saturations above 90%.  Discussed reasons to return to the ED in detail. Patient discharged in stable condition with understanding of reasons to return.   Final Clinical Impressions(s) / ED Diagnoses   Final diagnoses:  Fall, initial encounter    ED Discharge Orders    None       Gareth Morgan, MD 09/30/18 [phone]

## 2018-09-29 NOTE — ED Notes (Signed)
Patient Alert and oriented to baseline. Stable and ambulatory to baseline. Patient verbalized understanding of the discharge instructions.  Patient belongings were taken by the patient.   

## 2018-09-29 NOTE — ED Notes (Signed)
Patient transported to CT 

## 2018-09-29 NOTE — ED Triage Notes (Signed)
Pt arrived [facility] from home s/p fall at unknown time. Pt wife found him sitting on the back steps at 1530. GCS 14 disoriented to time at baseline, hx of CVA with L sided residual weakness per EMS. EMS also reports pt was hyperthermic at 101.7 (ems states they administered 278ml NS and used cool towels. 18G IV to the LFA, abbrassion to left knee and right thumb. Pt is taking xarelto, C-Collar in place via EMS, pt reports no complaints. BP 130/70 CBG 132 O2 saturation 89% RA with no hx of respiratory complaints.

## 2018-09-29 NOTE — ED Notes (Signed)
Pt returned from imaging.

## 2018-10-04 DIAGNOSIS — I739 Peripheral vascular disease, unspecified: Secondary | ICD-10-CM | POA: Diagnosis not present

## 2018-10-04 DIAGNOSIS — L03011 Cellulitis of right finger: Secondary | ICD-10-CM | POA: Diagnosis not present

## 2018-10-04 DIAGNOSIS — R269 Unspecified abnormalities of gait and mobility: Secondary | ICD-10-CM | POA: Diagnosis not present

## 2018-10-04 DIAGNOSIS — R509 Fever, unspecified: Secondary | ICD-10-CM | POA: Diagnosis not present

## 2018-10-04 DIAGNOSIS — W19XXXA Unspecified fall, initial encounter: Secondary | ICD-10-CM | POA: Diagnosis not present

## 2018-10-15 ENCOUNTER — Other Ambulatory Visit: Payer: Self-pay | Admitting: Pharmacist

## 2018-10-15 ENCOUNTER — Other Ambulatory Visit: Payer: Self-pay

## 2018-10-15 NOTE — Patient Outreach (Signed)
[facility] [facility]) Care Management  10/15/2018  Steven Guerrero Oct 22, 1940 [id number]  TELEPHONE SCREENING Referral date: 10/06/18 Referral source: utilization management Referral reason: medication assistance.  Insurance: [employer]  Telephone call to patient's wife Scientist, research (physical sciences) per referral.  Wife listed as patients designated party release.  RNCM introduced herself and explained reason for call. Wife states patient needs assistance with xeralto cost. Wife states patient has follow up visits with the pharmacist at his primary MD office but is not receiving medication assistance. Wife states patient has a history of stroke and traumatic brain injury. RNCM discussed and offered [facility] care management services.  Wife verbally agreed to pharmacy services. Denies any additional needs or concerns.    PLAN: RNCM will refer patient to [facility] pharmacist for medication assistance.   Quinn Plowman RN,BSN,CCM [facility] Telephonic  [phone]

## 2018-10-15 NOTE — Patient Outreach (Signed)
[place] [facility]) Care Management  [place]   10/15/2018  Anastacio Bua Canipe 21-Oct-1940 [id number]  Reason for referral: Medication Assistance with Xarelto  Referral source: [place] Utilization Management Department Current insurance: [employer]  Outreach:  Successful telephone call with spouse, [name].  HIPAA identifiers verified.   Subjective:  Spouse reports co-pay for Xarelto is $47 / month for medication.  She is currently working this year and is worried about the co-pay if she stops working.  She denies having any other medication questions or concerns.  She is agreeable to review medications.   Objective: The ASCVD Risk score Mikey Bussing DC Jr., et al., 2013) failed to calculate for the following reasons:   The patient has a prior MI or stroke diagnosis  Lab Results  Component Value Date   CREATININE 1.81 (H) 04/05/2018   CREATININE 1.71 (H) 04/04/2018   CREATININE 1.46 (H) 04/03/2018    Lab Results  Component Value Date   HGBA1C 5.8 (H) 04/02/2018    Lipid Panel     Component Value Date/Time   CHOL 78 04/03/2018 0539   TRIG 62 04/03/2018 0539   HDL 45 04/03/2018 0539   CHOLHDL 1.7 04/03/2018 0539   VLDL 12 04/03/2018 0539   LDLCALC 21 04/03/2018 0539    BP Readings from Last 3 Encounters:  09/29/18 132/66  04/05/18 101/72  05/27/17 104/64    Allergies  Allergen Reactions  . Atenolol Other (See Comments)    Does not remember reaction  . Lotensin [Benazepril Hcl] Other (See Comments)    Does not remember reaction  . Wellbutrin [Bupropion] Other (See Comments)    Does not remember reaction    Medications Reviewed Today    Reviewed by Daymon Larsen, CPhT (Pharmacy Technician) on 04/02/18 at 1520  Med List Status: Complete  Medication Order Taking? Sig Documenting Provider Last Dose Status Informant  atorvastatin (LIPITOR) 40 MG tablet [id number] Yes Take 40 mg by mouth daily. [provider]  04/02/2018 Unknown time Active Spouse/Significant Other  baclofen (LIORESAL) 10 MG tablet [id number] Yes TAKE 1 TABLET BY MOUTH AT  BEDTIME  Patient taking differently: Take 10 mg by mouth at bedtime as needed for muscle spasms.    Gerda Diss, DO unknown Active Spouse/Significant Other  citalopram (CELEXA) 40 MG tablet [id number] Yes Take 40 mg by mouth at bedtime. [provider] 04/01/2018 Unknown time Active Spouse/Significant Other  fish oil-omega-3 fatty acids 1000 MG capsule [id number] Yes Take 1 g by mouth daily. [provider] 04/01/2018 Unknown time Active Spouse/Significant Other  gabapentin (NEURONTIN) 100 MG capsule [id number] Yes Take 100 mg by mouth 2 (two) times daily.  [provider] 04/02/2018 Unknown time Active Spouse/Significant Other           Med Note Robie Ridge Aug 07, 2016 11:50 AM)    losartan-hydrochlorothiazide (HYZAAR) 100-25 MG per tablet [id number] Yes Take 1 tablet by mouth daily. [provider] Past Month Unknown time Active Spouse/Significant Other           Med Note Leanord Asal, TREVINA M   Fri Apr 02, 2018 11:37 AM) Needs refills  Multiple Vitamin (MULTIVITAMIN WITH MINERALS) TABS tablet [id number] Yes Take 1 tablet by mouth daily. [provider] 04/01/2018 Unknown time Active Spouse/Significant Other  nitroGLYCERIN (NITRODUR - DOSED IN MG/24 HR) 0.2 mg/hr patch [id number] Yes APPLY 1/4 PATCH TO AFFECTED AREA DAILY. CHANGE PATCH DAILY AND SLIGHTLY ALTER LOCATION DAILY  Patient  taking differently: Place 0.2 mg onto the skin daily as needed (pain).    Gerda Diss, DO unknown Active Spouse/Significant Other  OVER THE COUNTER MEDICATION [id number] Yes Take 1 tablet by mouth daily. Vitamin for vision **areds** [provider] 04/02/2018 Unknown time Active Spouse/Significant Other  pantoprazole (PROTONIX) 40 MG tablet [id number] Yes Take 40 mg by mouth daily.  [provider] 04/02/2018 Unknown time Active  Spouse/Significant Other  traZODone (DESYREL) 50 MG tablet [id number] Yes Take 50 mg by mouth at bedtime. [provider] 04/01/2018 Unknown time Active Spouse/Significant Other  vitamin B-12 (CYANOCOBALAMIN) 1000 MCG tablet [id number] Yes Take 1,000 mcg by mouth daily. [provider] 04/01/2018 Unknown time Active Spouse/Significant Other  warfarin (COUMADIN) 5 MG tablet [id number] Yes Take 5-7.5 mg by mouth See admin instructions. Take (5MG) by mouth only on Tuesdays and Saturdays, all other days take (7.5MG) [provider] 04/01/2018 4pm Active Spouse/Significant Other           Med Note (CASH, TREVINA M   Fri Apr 02, 2018  3:20 PM) Per spouse (5MG) by mouth only on Tuesdays and Saturdays, all other days take (7.5MG)           Assessment: Drugs sorted by system:  Neurologic/Psychologic: trazodone  Hematologic: rivaroxaban  Cardiovascular: atorvastatin, fish oil-omega-3 fatty acids  Gastrointestinal: pantoprazole  Pain:baclofen, gabapentin  Vitamins/Minerals/Supplements: MVI, AREDs eye vitamin, vitamin B12  Medication Assistance Findings:  Medication assistance needs identified: Xarelto  Extra Help:  Not eligible for Extra Help Low Income Subsidy based on reported income and assets  Patient Assistance Programs: Xarelto made by [employer] and [place] requirement met: Yes o Out-of-pocket prescription expenditure met:   No (4% household income) - Patient has not met application requirements to apply for this program at this time.     Reviewed patient assistance plan with spouse.  She voiced understanding that patient not eligible yet to apply.  I provided her with my contact information if she needs to reach out to me in the future.   Plan: . Will close [facility] pharmacy case as no further medication needs identified at this time.  Am happy to assist in the future as needed.     Ralene Bathe, PharmD, [place] [phone]

## 2018-10-25 ENCOUNTER — Telehealth: Payer: Self-pay | Admitting: Physical Therapy

## 2018-10-25 ENCOUNTER — Encounter: Payer: Self-pay | Admitting: Physical Therapy

## 2018-10-25 ENCOUNTER — Other Ambulatory Visit: Payer: Self-pay

## 2018-10-25 ENCOUNTER — Ambulatory Visit: Payer: Medicare Other | Attending: Internal Medicine | Admitting: Physical Therapy

## 2018-10-25 VITALS — BP 135/67 | HR 68

## 2018-10-25 DIAGNOSIS — R2681 Unsteadiness on feet: Secondary | ICD-10-CM | POA: Insufficient documentation

## 2018-10-25 DIAGNOSIS — R262 Difficulty in walking, not elsewhere classified: Secondary | ICD-10-CM | POA: Diagnosis not present

## 2018-10-25 DIAGNOSIS — R2689 Other abnormalities of gait and mobility: Secondary | ICD-10-CM | POA: Insufficient documentation

## 2018-10-25 DIAGNOSIS — Z9181 History of falling: Secondary | ICD-10-CM | POA: Diagnosis not present

## 2018-10-25 DIAGNOSIS — M6281 Muscle weakness (generalized): Secondary | ICD-10-CM | POA: Diagnosis not present

## 2018-10-25 NOTE — Therapy (Signed)
[facility] [address] [place], [place], [address] Phone: [phone]   Fax:  [phone]  Physical Therapy Evaluation  Patient Details  Name: Steven Guerrero MRN: [id number] Date of Birth: 08/08/1940 Referring Provider (PT): Deland Pretty, MD  CLINIC OPERATION CHANGES: Outpatient Neuro Rehab is open at lower capacity following universal masking, social distancing, and patient screening.  The patient's COVID risk of complications score is 4.   Encounter Date: 10/25/2018  PT End of Session - 10/25/18 0917    Visit Number  1    Number of Visits  9    Date for PT Re-Evaluation  12/24/18    Authorization Type  UHC Medicare    PT Start Time  0755    PT Stop Time  0842    PT Time Calculation (min)  47 min    Equipment Utilized During Treatment  Gait belt    Activity Tolerance  Patient tolerated treatment well;Patient limited by fatigue    Behavior During Therapy  WFL for tasks assessed/performed;Flat affect       Past Medical History:  Diagnosis Date  . Anxiety and depression   . Back pain   . Brain bleed ([place])    2003  . CVA (cerebral infarction)    2001  . Hypercholesteremia   . Hypertension   . Peripheral neuropathy   . Pulmonary embolism ([place])   . Sleep apnea   . Stroke ([place])    2001  . TBI (traumatic brain injury) [facility])     Past Surgical History:  Procedure Laterality Date  . HERNIA REPAIR    . JOINT REPLACEMENT Right    hip replacement  . LEG SURGERY     Broken femur  . TOTAL HIP ARTHROPLASTY     Right    Vitals:   10/25/18 0758  BP: 135/67  Pulse: 68  SpO2: 96%     Subjective Assessment - 10/25/18 0759    Subjective  Had a fall and went to ER on 09/29/18. Patient walked out to his patio and hit his leg on one of the chairs and he fell down. Has been using a RW for 2 or 3 years    Pertinent History  CVA (2001), hypercholesteremia, HTN, peripheral neuropathy, TBI, DVT, CKD, PVD    Patient  Stated Goals  Wants to get up and move more, improve his walking and his balance         [facility] PT Assessment - 10/25/18 0749      Assessment   Medical Diagnosis  Gait disorder    Referring Provider (PT)  Deland Pretty, MD    Onset Date/Surgical Date  09/29/18   s/p mechanical fall    Prior Therapy  HHPT      Precautions   Precautions  Fall    Precaution Comments  Patient with increased work of breathing throughout session, O2 monitored throughout, WNLs at 96%. Frequent seated rest breaks d/t fatigue      Balance Screen   Has the patient fallen in the past 6 months  Yes    How many times?  1    Has the patient had a decrease in activity level because of a fear of falling?   No    Is the patient reluctant to leave their home because of a fear of falling?   No      Home Film/video editor residence    Living Arrangements  Spouse/significant other  Type of [place] to enter    Entrance Stairs-Number of Steps  1    Entrance Stairs-Rails  None    Home Layout  One level    [place] - 2 wheels    Additional Comments  Has been using RW for past 2-3 years       Prior Function   Level of Independence  Independent with household mobility with device;Independent    Vocation  Retired    Vocation Requirements  [employer] [employer]      Cognition   Overall Cognitive Status  History of cognitive impairments - at baseline   hx of CVA (2001)/TBI     Observation/Other Assessments   Observations  Mild pitting edema RLE, Moderate pitting edema LLE       Sensation   Light Touch  Appears Intact      Coordination   Gross Motor Movements are Fluid and Coordinated  Yes    Heel Shin Test  Unable to perform on R (d/t pt reporting prior R THR), WFL on L      ROM / Strength   AROM / PROM / Strength  Strength      Strength   Overall Strength Comments  R hip flexion 4/5, R knee flexion 4/5, L knee flexion 4+/5, R ankle DF 3+/5, L ankle DF  4+/5. all other LE strength 5/5       Transfers   Five time sit to stand comments   36.84 seconds, mat table with one UE support    poor eccentric control, braces with bilateral LE     Ambulation/Gait   Ambulation/Gait  Yes    Ambulation/Gait Assistance  5: Supervision;4: Min guard    Ambulation/Gait Assistance Details  Pt demonstrates increased R toe out and forwardly flexed posture, pt needing frequent verbal reminders to stay close to RW (pt has tendency to push RW out and lean forward). Shuffled gait noted, especially when performing turns.     Ambulation Distance (Feet)  100 Feet   plus TUG   Assistive device  Rolling walker    Gait Pattern  Step-to pattern;Decreased step length - left;Decreased stance time - right;Decreased weight shift to right;Decreased dorsiflexion - right;Shuffle;Trunk flexed;Poor foot clearance - left;Poor foot clearance - right    Ambulation Surface  Level;Indoor    Gait velocity  29.19 seconds = 1.12 ft/sec      Standardized Balance Assessment   Standardized Balance Assessment  Berg Balance Test;Timed Up and Go Test      Berg Balance Test   Sit to Stand  Able to stand  independently using hands    Standing Unsupported  Able to stand safely 2 minutes    Sitting with Back Unsupported but Feet Supported on Floor or Stool  Able to sit safely and securely 2 minutes    Stand to Sit  Uses backs of legs against chair to control descent    Transfers  Able to transfer with verbal cueing and /or supervision    Standing Unsupported with Eyes Closed  Able to stand 10 seconds with supervision    Standing Unsupported with Feet Together  Able to place feet together independently and stand for 1 minute with supervision    From Standing, Reach Forward with Outstretched Arm  Can reach forward >5 cm safely (2")    From Standing Position, Pick up Object from Floor  Able to pick up shoe, needs supervision  From Standing Position, Turn to Look Behind Over each Shoulder  Turn  sideways only but maintains balance    Turn 360 Degrees  Needs close supervision or verbal cueing    Standing Unsupported, Alternately Place Feet on Step/Stool  Needs assistance to keep from falling or unable to try    Standing Unsupported, One Foot in Front  Needs help to step but can hold 15 seconds    Standing on One Leg  Tries to lift leg/unable to hold 3 seconds but remains standing independently    Total Score  31    Berg comment:  31/56      Timed Up and Go Test   Normal TUG (seconds)  55.66                Objective measurements completed on examination: See above findings.              PT Education - 10/25/18 0916    Education Details  POC, clinical examination findings, safety with gait when using RW, discussion about having pt's wife drive to and from appointments    Person(s) Educated  Patient    Methods  Explanation    Comprehension  Verbalized understanding;Returned demonstration;Need further instruction       PT Short Term Goals - 10/25/18 1062      PT SHORT TERM GOAL #1   Title  Patient will be independent with initial HEP in order to build upon functional gains in therapy. ALL STGs due 11/24/18    Time  4    Period  Weeks    Status  New    Target Date  11/24/18      PT SHORT TERM GOAL #2   Title  Patient will decrease 5 times sit <> stand score to at least 31 seconds in order to improve functional LE strength.    Baseline  36.84 seconds    Time  4    Period  Weeks    Status  New      PT SHORT TERM GOAL #3   Title  Patient will improve gait speed with RW to at least 1.50 ft/second in order to decrease risk of falls.    Baseline  1.12 ft/sec with RW on 10/25/18    Time  4    Period  Weeks      PT SHORT TERM GOAL #4   Title  Pt and pt's wife will verbalize understanding of fall prevention in home environment.    Time  4    Period  Weeks    Status  New      PT SHORT TERM GOAL #5   Title  Patient will improve BERG score to at least a  37/56 in order to decrease fall risk.    Baseline  31/56 on 10/25/18    Time  4    Period  Weeks    Status  New        PT Long Term Goals - 10/25/18 1230      PT LONG TERM GOAL #1   Title  Patient will be independent with final HEP in order to build upon functional gains made in therapy. ALL STG TARGET DATE 12/24/18    Time  8    Period  Weeks    Status  New    Target Date  12/24/18      PT LONG TERM GOAL #2   Title  Patient will decrease 5 times sit <> stand score  to at least 28 seconds in order to improve functional LE strength.    Baseline  36.84 seconds    Time  8    Period  Weeks      PT LONG TERM GOAL #3   Title  Patient will improve gait speed with RW to at least 1.75 ft/second in order to decrease risk of falls.    Baseline  1.12 ft/sec on 10/25/18    Time  8    Period  Weeks    Status  New      PT LONG TERM GOAL #4   Title  Patient will improve BERG score to at least a 41/56 in order to decrease fall risk.    Baseline  31/56 on 10/25/18    Time  8    Period  Weeks    Status  New      PT LONG TERM GOAL #5   Title  Patient will decrease TUG score to at least 45 seconds in order to decrease future fall risk.    Baseline  55.66 seconds on 10/25/18    Time  8    Period  Weeks    Status  New             Plan - 10/25/18 6734    Clinical Impression Statement  Patient is a 78 year old male referred to Neuro OPPT for evaluation with primary concern of gait disorder. Pt went to ED on 09/29/18 s/p a mechanical fall (walked out on his patio, hit leg and fell down).  Pt's PMH is significant for: CVA (2001), TBI, hypercholesteremia, HTN, peripheral neuropathy, DVT, CKD, PVD.  The following deficits were present during the exam: gait abnormalities, balance impairments, LE strength deficits, incorrect mechanics using RW, and decreased endurance. Pt's gait speed, BERG, TUG, and 5x sit <> stand scores indicate pt is at a high risk for future falls.  Pt would benefit from skilled PT  to address these impairments and functional limitations to maximize functional mobility independence and safety.    Personal Factors and Comorbidities  Age;Comorbidity 3+;Past/Current Experience;Time since onset of injury/illness/exacerbation    Comorbidities  CVA (2001), hypercholesteremia, HTN, peripheral neuropathy, TBI, DVT, CKD, PVD, R THR    Examination-Activity Limitations  Stand;Locomotion Level    Stability/Clinical Decision Making  Evolving/Moderate complexity    Clinical Decision Making  Moderate    Rehab Potential  Fair    PT Frequency  1x / week   after discussing on phone with wife 1x/week d/t wife's work schedule, pt compliance   PT Duration  8 weeks    PT Treatment/Interventions  ADLs/Self Care Home Management;DME Instruction;Gait training;Stair training;Functional mobility training;Therapeutic activities;Therapeutic exercise;Balance training;Patient/family education;Neuromuscular re-education    PT Next Visit Plan  order for lift chair?, asked wife to be present for session d/t pt's cognitive deficits, initial HEP for balance and strengthening. Gait training with RW    Consulted and Agree with Plan of Care  Patient;Family member/caregiver    Family Member Consulted  discussed with wife Rose via phone        After session called pt's wife Kalman Shan) to discuss clinical findings and safety concerns about pt driving d/t cognitive deficits and decreased LE strength. Had discussion about having Rose drive pt to and from appointments. Rose verbalized agreement and set up appointment that would work with her schedule. Rose also mentioned that primary concern for pt coming to therapy was to get a lift chair. Discussed with Rose that those will  probably not be covered by insurance and it would be an out of pocket expense. Rose states that that is something her and the pt are still interested in and that the therapist would write a letter to receive a script from the referring  MD.     Patient will benefit from skilled therapeutic intervention in order to improve the following deficits and impairments:  Abnormal gait, Decreased activity tolerance, Decreased balance, Decreased endurance, Decreased coordination, Decreased knowledge of use of DME, Decreased mobility, Decreased range of motion, Decreased safety awareness, Difficulty walking, Decreased strength, Postural dysfunction  Visit Diagnosis: 1. Other abnormalities of gait and mobility   2. History of falling   3. Unsteadiness on feet   4. Muscle weakness (generalized)   5. Difficulty in walking, not elsewhere classified        Problem List Patient Active Problem List   Diagnosis Date Noted  . Bilateral leg pain 04/04/2018  . TIA (transient ischemic attack) 04/02/2018  . Essential hypertension 04/02/2018  . History of stroke 04/02/2018  . History of pulmonary embolus (PE) 04/02/2018  . H/O total hip arthroplasty 10/09/2016  . Laceration of right forearm 10/09/2016  . Long term current use of anticoagulant 02/08/2016  . Right hip pain 09/12/2015  . Abnormality of gait 08/28/2015  . Head trauma 08/28/2015  . Stroke ([place]) 08/28/2015  . Neck pain 08/28/2015  . Low back pain 08/28/2015    Arliss Journey, PT, DPT 10/25/2018, 1:47 PM  [facility] [address] [place], [place], [address] Phone: [phone]   Fax:  [phone]  Name: Steven Guerrero MRN: [id number] Date of Birth: 12-Aug-1940

## 2018-10-25 NOTE — Telephone Encounter (Signed)
Dr. Shelia Media,  I have evaluated Metta Clines today for PT at [facility] for gait disorder. Due to impaired functional lower extremity strength, Delphin and his wife are interested in purchasing a lift chair for home. Because the lift chair is considered DME, we would need an order for one.   The fax number is [phone].   Thank you, Janann August, PT, DPT 10/25/18 4:18 PM

## 2018-10-28 ENCOUNTER — Ambulatory Visit: Payer: Medicare Other | Admitting: Physical Therapy

## 2018-10-28 ENCOUNTER — Encounter: Payer: Self-pay | Admitting: Physical Therapy

## 2018-10-28 ENCOUNTER — Other Ambulatory Visit: Payer: Self-pay

## 2018-10-28 DIAGNOSIS — R262 Difficulty in walking, not elsewhere classified: Secondary | ICD-10-CM | POA: Diagnosis not present

## 2018-10-28 DIAGNOSIS — Z9181 History of falling: Secondary | ICD-10-CM | POA: Diagnosis not present

## 2018-10-28 DIAGNOSIS — R2689 Other abnormalities of gait and mobility: Secondary | ICD-10-CM

## 2018-10-28 DIAGNOSIS — R2681 Unsteadiness on feet: Secondary | ICD-10-CM | POA: Diagnosis not present

## 2018-10-28 DIAGNOSIS — M6281 Muscle weakness (generalized): Secondary | ICD-10-CM

## 2018-10-28 NOTE — Therapy (Signed)
[facility] [address] [place], [place], [address] Phone: [phone]   Fax:  [phone]  Physical Therapy Treatment  Patient Details  Name: Steven Guerrero MRN: [id number] Date of Birth: November 02, 1940 Referring Provider (PT): Deland Pretty, MD   Encounter Date: 10/28/2018  PT End of Session - 10/28/18 1236    Visit Number  2    Number of Visits  9    Date for PT Re-Evaluation  12/24/18    Authorization Type  UHC Medicare    PT Start Time  1232    PT Stop Time  1311    PT Time Calculation (min)  39 min    Equipment Utilized During Treatment  Gait belt    Activity Tolerance  Patient tolerated treatment well;Patient limited by fatigue    Behavior During Therapy  [facility] for tasks assessed/performed;Flat affect       Past Medical History:  Diagnosis Date  . Anxiety and depression   . Back pain   . Brain bleed ([place])    2003  . CVA (cerebral infarction)    2001  . Hypercholesteremia   . Hypertension   . Peripheral neuropathy   . Pulmonary embolism ([place])   . Sleep apnea   . Stroke ([place])    2001  . TBI (traumatic brain injury) [facility])     Past Surgical History:  Procedure Laterality Date  . HERNIA REPAIR    . JOINT REPLACEMENT Right    hip replacement  . LEG SURGERY     Broken femur  . TOTAL HIP ARTHROPLASTY     Right    There were no vitals filed for this visit.  Subjective Assessment - 10/28/18 1234    Subjective  No new complaitns. No falls or pain to report.    Pertinent History  CVA (2001), hypercholesteremia, HTN, peripheral neuropathy, TBI, DVT, CKD, PVD    Patient Stated Goals  Wants to get up and move more, improve his walking and his balance    Currently in Pain?  No/denies          [facility] Adult PT Treatment/Exercise - 10/28/18 1251      Transfers   Transfers  Sit to Stand;Stand to Sit    Sit to Stand  With upper extremity assist;5: Supervision;From bed;From chair/3-in-1    Stand to Sit  5:  Supervision;With upper extremity assist;To bed;To chair/3-in-1      Ambulation/Gait   Ambulation/Gait  Yes    Ambulation/Gait Assistance  5: Supervision    Ambulation/Gait Assistance Details  cues needed for upright posture, increased bil step length and walker position at pt tends to push walker out too far with gait.    Ambulation Distance (Feet)  60 Feet   x2   Assistive device  Rolling walker    Gait Pattern  Step-to pattern;Decreased step length - left;Decreased stance time - right;Decreased weight shift to right;Decreased dorsiflexion - right;Shuffle;Trunk flexed;Poor foot clearance - left;Poor foot clearance - right    Ambulation Surface  Level;Indoor      Neuro Re-ed    Neuro Re-ed Details   added ex's to HEP to address balance, refer to medbridge program. min guard assist for balance.       Exercises   Exercises  Other Exercises    Other Exercises   added ex's to HEP via [place] for strengthening. cues on form/technique needed.         Access Code: [email or web address]  URL: [email or web address]  Date: 10/28/2018  Prepared by: Willow Ora   Exercises  Sit to Stand with Armchair - 10 reps - 1 sets - 1x daily - 5x weekly  Seated Hip Abduction - 10 reps - 1 sets - 1x daily - 5x weekly  Heel Toe Raises with Counter Support - 10 reps - 1 sets - 1x daily - 5x weekly  Standing Hip Abduction with Counter Support - 10 reps - 1 sets - 1x daily - 5x weekly  Standing Balance with Eyes Closed - 3 reps - 1 sets - 30 hold - 1x daily - 5x weekly  Standing Romberg to 1/2 Tandem Stance - 2 reps - 1 sets - 30 hold - 1x daily - 5x weekly  Standing Single Leg Stance with Unilateral Counter Support - 2 reps - 1 sets - 15 hold - 1x daily - 5x weekly       PT Education - 10/28/18 1322    Education Details  HEP for balance and strengthening    Person(s) Educated  Patient;Spouse    Methods  Explanation;Demonstration;Verbal cues;Handout    Comprehension  Verbalized understanding;Returned  demonstration;Verbal cues required;Need further instruction       PT Short Term Goals - 10/25/18 0962      PT SHORT TERM GOAL #1   Title  Patient will be independent with initial HEP in order to build upon functional gains in therapy. ALL STGs due 11/24/18    Time  4    Period  Weeks    Status  New    Target Date  11/24/18      PT SHORT TERM GOAL #2   Title  Patient will decrease 5 times sit <> stand score to at least 31 seconds in order to improve functional LE strength.    Baseline  36.84 seconds    Time  4    Period  Weeks    Status  New      PT SHORT TERM GOAL #3   Title  Patient will improve gait speed with RW to at least 1.50 ft/second in order to decrease risk of falls.    Baseline  1.12 ft/sec with RW on 10/25/18    Time  4    Period  Weeks      PT SHORT TERM GOAL #4   Title  Pt and pt's wife will verbalize understanding of fall prevention in home environment.    Time  4    Period  Weeks    Status  New      PT SHORT TERM GOAL #5   Title  Patient will improve BERG score to at least a 37/56 in order to decrease fall risk.    Baseline  31/56 on 10/25/18    Time  4    Period  Weeks    Status  New        PT Long Term Goals - 10/25/18 1230      PT LONG TERM GOAL #1   Title  Patient will be independent with final HEP in order to build upon functional gains made in therapy. ALL STG TARGET DATE 12/24/18    Time  8    Period  Weeks    Status  New    Target Date  12/24/18      PT LONG TERM GOAL #2   Title  Patient will decrease 5 times sit <> stand score to at least 28 seconds in order to improve functional LE strength.    Baseline  36.84  seconds    Time  8    Period  Weeks      PT LONG TERM GOAL #3   Title  Patient will improve gait speed with RW to at least 1.75 ft/second in order to decrease risk of falls.    Baseline  1.12 ft/sec on 10/25/18    Time  8    Period  Weeks    Status  New      PT LONG TERM GOAL #4   Title  Patient will improve BERG score to at  least a 41/56 in order to decrease fall risk.    Baseline  31/56 on 10/25/18    Time  8    Period  Weeks    Status  New      PT LONG TERM GOAL #5   Title  Patient will decrease TUG score to at least 45 seconds in order to decrease future fall risk.    Baseline  55.66 seconds on 10/25/18    Time  8    Period  Weeks    Status  New            Plan - 10/28/18 1237    Clinical Impression Statement  Today's skilled session focused on gait training with RW and on establishment of an HEP to address strengthening and balance. The pt is progressing toward goals and should benefit from continued PT to progress toward unmet goals.    Personal Factors and Comorbidities  Age;Comorbidity 3+;Past/Current Experience;Time since onset of injury/illness/exacerbation    Comorbidities  CVA (2001), hypercholesteremia, HTN, peripheral neuropathy, TBI, DVT, CKD, PVD, R THR    Examination-Activity Limitations  Stand;Locomotion Level    Stability/Clinical Decision Making  Evolving/Moderate complexity    Rehab Potential  Fair    PT Frequency  1x / week   after discussing on phone with wife 1x/week d/t wife's work schedule, pt compliance   PT Duration  8 weeks    PT Treatment/Interventions  ADLs/Self Care Home Management;DME Instruction;Gait training;Stair training;Functional mobility training;Therapeutic activities;Therapeutic exercise;Balance training;Patient/family education;Neuromuscular re-education    PT Next Visit Plan  gait with RW (? try theraband strap aross back of walker; continue to work on strengthening and balance.    PT Home Exercise Plan  Access Code: [email or web address]    Consulted and Agree with Plan of Care  Patient;Family member/caregiver    Family Member Consulted  discussed with wife Rose via phone       Patient will benefit from skilled therapeutic intervention in order to improve the following deficits and impairments:  Abnormal gait, Decreased activity tolerance, Decreased balance, Decreased  endurance, Decreased coordination, Decreased knowledge of use of DME, Decreased mobility, Decreased range of motion, Decreased safety awareness, Difficulty walking, Decreased strength, Postural dysfunction  Visit Diagnosis: 1. Other abnormalities of gait and mobility   2. History of falling   3. Unsteadiness on feet   4. Muscle weakness (generalized)   5. Difficulty in walking, not elsewhere classified        Problem List Patient Active Problem List   Diagnosis Date Noted  . Bilateral leg pain 04/04/2018  . TIA (transient ischemic attack) 04/02/2018  . Essential hypertension 04/02/2018  . History of stroke 04/02/2018  . History of pulmonary embolus (PE) 04/02/2018  . H/O total hip arthroplasty 10/09/2016  . Laceration of right forearm 10/09/2016  . Long term current use of anticoagulant 02/08/2016  . Right hip pain 09/12/2015  . Abnormality of gait 08/28/2015  . Head trauma 08/28/2015  .  Stroke ([place]) 08/28/2015  . Neck pain 08/28/2015  . Low back pain 08/28/2015    Willow Ora, PTA, [facility] [address], [place], [place] [address] [phone] 10/28/18, 1:24 PM   Name: Steven Guerrero MRN: [id number] Date of Birth: [birth year]

## 2018-10-28 NOTE — Patient Instructions (Signed)
Access Code: [email or web address]  URL: [email or web address]  Date: 10/28/2018  Prepared by: Willow Ora   Exercises  Sit to Stand with Armchair - 10 reps - 1 sets - 1x daily - 5x weekly  Seated Hip Abduction - 10 reps - 1 sets - 1x daily - 5x weekly  Heel Toe Raises with Counter Support - 10 reps - 1 sets - 1x daily - 5x weekly  Standing Hip Abduction with Counter Support - 10 reps - 1 sets - 1x daily - 5x weekly  Standing Balance with Eyes Closed - 3 reps - 1 sets - 30 hold - 1x daily - 5x weekly  Standing Romberg to 1/2 Tandem Stance - 2 reps - 1 sets - 30 hold - 1x daily - 5x weekly  Standing Single Leg Stance with Unilateral Counter Support - 2 reps - 1 sets - 15 hold - 1x daily - 5x weekly

## 2018-11-02 ENCOUNTER — Encounter: Payer: Self-pay | Admitting: Physical Therapy

## 2018-11-02 ENCOUNTER — Other Ambulatory Visit: Payer: Self-pay

## 2018-11-02 ENCOUNTER — Ambulatory Visit: Payer: Medicare Other | Admitting: Physical Therapy

## 2018-11-02 DIAGNOSIS — Z9181 History of falling: Secondary | ICD-10-CM | POA: Diagnosis not present

## 2018-11-02 DIAGNOSIS — R2689 Other abnormalities of gait and mobility: Secondary | ICD-10-CM

## 2018-11-02 DIAGNOSIS — M6281 Muscle weakness (generalized): Secondary | ICD-10-CM

## 2018-11-02 DIAGNOSIS — R2681 Unsteadiness on feet: Secondary | ICD-10-CM

## 2018-11-02 DIAGNOSIS — R262 Difficulty in walking, not elsewhere classified: Secondary | ICD-10-CM | POA: Diagnosis not present

## 2018-11-03 NOTE — Therapy (Signed)
[facility] [address] [place], [place], [address] Phone: [phone]   Fax:  [phone]  Physical Therapy Treatment  Patient Details  Name: Steven Guerrero MRN: [id number] Date of Birth: 11-04-1940 Referring Provider (PT): Deland Pretty, MD   Encounter Date: 11/02/2018  PT End of Session - 11/02/18 1321    Visit Number  3    Number of Visits  9    Date for PT Re-Evaluation  12/24/18    Authorization Type  [employer] Medicare    PT Start Time  1316    PT Stop Time  1355    PT Time Calculation (min)  39 min    Equipment Utilized During Treatment  Gait belt    Activity Tolerance  Patient tolerated treatment well;Patient limited by fatigue    Behavior During Therapy  [facility] for tasks assessed/performed;Flat affect       Past Medical History:  Diagnosis Date  . Anxiety and depression   . Back pain   . Brain bleed ([place])    2003  . CVA (cerebral infarction)    2001  . Hypercholesteremia   . Hypertension   . Peripheral neuropathy   . Pulmonary embolism ([place])   . Sleep apnea   . Stroke ([place])    2001  . TBI (traumatic brain injury) [facility])     Past Surgical History:  Procedure Laterality Date  . HERNIA REPAIR    . JOINT REPLACEMENT Right    hip replacement  . LEG SURGERY     Broken femur  . TOTAL HIP ARTHROPLASTY     Right    There were no vitals filed for this visit.  Subjective Assessment - 11/02/18 1321    Subjective  No new complaints. No falls to report. Does report being sore in the arms and legs from exercising at home.    Pertinent History  CVA (2001), hypercholesteremia, HTN, peripheral neuropathy, TBI, DVT, CKD, PVD    Patient Stated Goals  Wants to get up and move more, improve his walking and his balance    Currently in Pain?  No/denies         [facility] Adult PT Treatment/Exercise - 11/02/18 1322      Transfers   Transfers  Sit to Stand;Stand to Sit    Sit to Stand  5: Supervision;With upper  extremity assist;From bed;From chair/3-in-1    Stand to Sit  5: Supervision;With upper extremity assist;To bed;To chair/3-in-1      Ambulation/Gait   Ambulation/Gait  Yes    Ambulation/Gait Assistance  5: Supervision;4: Min guard    Ambulation/Gait Assistance Details  utilized theraband strap behind pt's buttocks hooked at eight end of back of RW to promote upright posture and maintain walker proximity with gait.  no band used on 2cd lap with good carryover, cues only needed.     Ambulation Distance (Feet)  115 Feet   x2 reps   Assistive device  Rolling walker    Gait Pattern  Step-to pattern;Step-through pattern;Decreased stride length;Decreased step length - right;Decreased step length - left;Right flexed knee in stance;Left flexed knee in stance;Right steppage;Left steppage;Shuffle;Trunk flexed;Poor foot clearance - left;Poor foot clearance - right    Ambulation Surface  Level;Indoor      Neuro Re-ed    Neuro Re-ed Details   for balance/coordination/muscle re-ed: standing on floor with feet hip width apart with 2 foam bubbles in front- alternating fwd toe taps, then alternating cross toe taps for ~10 reps each side,  HHA with min assist for balance; standing across red foam beam- alternating fwd stepping to floor/back onto beam, cues for increased step length/height with min to mod assist for balance. cues for weight shifting with each step as well.       Exercises   Exercises  Other Exercises    Other Exercises   seated at edge of mat table: with 2# ankle weights- long arc quads and alternating marching for 10 reps each, then with red band- hamstring curls for 10 reps, all with bil LE's and needing cues for slow, controlled movements.                PT Short Term Goals - 10/25/18 1914      PT SHORT TERM GOAL #1   Title  Patient will be independent with initial HEP in order to build upon functional gains in therapy. ALL STGs due 11/24/18    Time  4    Period  Weeks    Status  New     Target Date  11/24/18      PT SHORT TERM GOAL #2   Title  Patient will decrease 5 times sit <> stand score to at least 31 seconds in order to improve functional LE strength.    Baseline  36.84 seconds    Time  4    Period  Weeks    Status  New      PT SHORT TERM GOAL #3   Title  Patient will improve gait speed with RW to at least 1.50 ft/second in order to decrease risk of falls.    Baseline  1.12 ft/sec with RW on 10/25/18    Time  4    Period  Weeks      PT SHORT TERM GOAL #4   Title  Pt and pt's wife will verbalize understanding of fall prevention in home environment.    Time  4    Period  Weeks    Status  New      PT SHORT TERM GOAL #5   Title  Patient will improve BERG score to at least a 37/56 in order to decrease fall risk.    Baseline  31/56 on 10/25/18    Time  4    Period  Weeks    Status  New        PT Long Term Goals - 10/25/18 1230      PT LONG TERM GOAL #1   Title  Patient will be independent with final HEP in order to build upon functional gains made in therapy. ALL STG TARGET DATE 12/24/18    Time  8    Period  Weeks    Status  New    Target Date  12/24/18      PT LONG TERM GOAL #2   Title  Patient will decrease 5 times sit <> stand score to at least 28 seconds in order to improve functional LE strength.    Baseline  36.84 seconds    Time  8    Period  Weeks      PT LONG TERM GOAL #3   Title  Patient will improve gait speed with RW to at least 1.75 ft/second in order to decrease risk of falls.    Baseline  1.12 ft/sec on 10/25/18    Time  8    Period  Weeks    Status  New      PT LONG TERM GOAL #4   Title  Patient will improve BERG score to at least a 41/56 in order to decrease fall risk.    Baseline  31/56 on 10/25/18    Time  8    Period  Weeks    Status  New      PT LONG TERM GOAL #5   Title  Patient will decrease TUG score to at least 45 seconds in order to decrease future fall risk.    Baseline  55.66 seconds on 10/25/18    Time  8     Period  Weeks    Status  New            Plan - 11/02/18 1322    Clinical Impression Statement  Today's skilled session continued to focus on gait training with RW, LE strengthening and balance reactions with rest breaks needed due to fatigue. The pt is progressing toward goals and should benefit from continued PT to progress toward unmet goals.    Personal Factors and Comorbidities  Age;Comorbidity 3+;Past/Current Experience;Time since onset of injury/illness/exacerbation    Comorbidities  CVA (2001), hypercholesteremia, HTN, peripheral neuropathy, TBI, DVT, CKD, PVD, R THR    Examination-Activity Limitations  Stand;Locomotion Level    Stability/Clinical Decision Making  Evolving/Moderate complexity    Rehab Potential  Fair    PT Frequency  1x / week   after discussing on phone with wife 1x/week d/t wife's work schedule, pt compliance   PT Duration  8 weeks    PT Treatment/Interventions  ADLs/Self Care Home Management;DME Instruction;Gait training;Stair training;Functional mobility training;Therapeutic activities;Therapeutic exercise;Balance training;Patient/family education;Neuromuscular re-education    PT Next Visit Plan  gait with RW- work on barriers as well such as ramp, curbs, stairs; continue to work on strengthening and balance.    PT Home Exercise Plan  Access Code: [email or web address]    Consulted and Agree with Plan of Care  Patient;Family member/caregiver    Family Member Consulted  discussed with wife Rose via phone       Patient will benefit from skilled therapeutic intervention in order to improve the following deficits and impairments:  Abnormal gait, Decreased activity tolerance, Decreased balance, Decreased endurance, Decreased coordination, Decreased knowledge of use of DME, Decreased mobility, Decreased range of motion, Decreased safety awareness, Difficulty walking, Decreased strength, Postural dysfunction  Visit Diagnosis: 1. Other abnormalities of gait and mobility   2.  History of falling   3. Unsteadiness on feet   4. Muscle weakness (generalized)   5. Difficulty in walking, not elsewhere classified        Problem List Patient Active Problem List   Diagnosis Date Noted  . Bilateral leg pain 04/04/2018  . TIA (transient ischemic attack) 04/02/2018  . Essential hypertension 04/02/2018  . History of stroke 04/02/2018  . History of pulmonary embolus (PE) 04/02/2018  . H/O total hip arthroplasty 10/09/2016  . Laceration of right forearm 10/09/2016  . Long term current use of anticoagulant 02/08/2016  . Right hip pain 09/12/2015  . Abnormality of gait 08/28/2015  . Head trauma 08/28/2015  . Stroke ([place]) 08/28/2015  . Neck pain 08/28/2015  . Low back pain 08/28/2015    Willow Ora, PTA, [facility] [address] [place], [place] [address] [phone] 11/03/18, 9:15 PM   Name: Steven Guerrero MRN: [id number] Date of Birth: 10/08/1940

## 2018-11-04 ENCOUNTER — Ambulatory Visit: Payer: Medicare Other | Admitting: Physical Therapy

## 2018-11-08 ENCOUNTER — Ambulatory Visit: Payer: Medicare Other | Admitting: Physical Therapy

## 2018-11-10 ENCOUNTER — Encounter: Payer: Self-pay | Admitting: Physical Therapy

## 2018-11-10 ENCOUNTER — Other Ambulatory Visit: Payer: Self-pay

## 2018-11-10 ENCOUNTER — Ambulatory Visit: Payer: Medicare Other | Admitting: Physical Therapy

## 2018-11-10 DIAGNOSIS — R262 Difficulty in walking, not elsewhere classified: Secondary | ICD-10-CM | POA: Diagnosis not present

## 2018-11-10 DIAGNOSIS — R2689 Other abnormalities of gait and mobility: Secondary | ICD-10-CM

## 2018-11-10 DIAGNOSIS — R2681 Unsteadiness on feet: Secondary | ICD-10-CM

## 2018-11-10 DIAGNOSIS — M6281 Muscle weakness (generalized): Secondary | ICD-10-CM | POA: Diagnosis not present

## 2018-11-10 DIAGNOSIS — Z9181 History of falling: Secondary | ICD-10-CM

## 2018-11-10 NOTE — Therapy (Signed)
[facility] [address] [place], [place], [address] Phone: [phone]   Fax:  [phone]  Physical Therapy Treatment  Patient Details  Name: Steven Guerrero MRN: [id number] Date of Birth: Jun 03, 1940 Referring Provider (PT): Deland Pretty, MD   Encounter Date: 11/10/2018  PT End of Session - 11/10/18 1452    Visit Number  4    Number of Visits  9    Date for PT Re-Evaluation  12/24/18    Authorization Type  [employer] Medicare    PT Start Time  9163    PT Stop Time  1315    PT Time Calculation (min)  40 min    Equipment Utilized During Treatment  Gait belt    Activity Tolerance  Patient tolerated treatment well;Patient limited by fatigue    Behavior During Therapy  [facility] for tasks assessed/performed;Flat affect       Past Medical History:  Diagnosis Date  . Anxiety and depression   . Back pain   . Brain bleed ([place])    2003  . CVA (cerebral infarction)    2001  . Hypercholesteremia   . Hypertension   . Peripheral neuropathy   . Pulmonary embolism ([place])   . Sleep apnea   . Stroke ([place])    2001  . TBI (traumatic brain injury) [facility])     Past Surgical History:  Procedure Laterality Date  . HERNIA REPAIR    . JOINT REPLACEMENT Right    hip replacement  . LEG SURGERY     Broken femur  . TOTAL HIP ARTHROPLASTY     Right    There were no vitals filed for this visit.  Subjective Assessment - 11/10/18 1238    Subjective  No falls, no new complaints. Pt states that he has been doing exercises everyday.    Pertinent History  CVA (2001), hypercholesteremia, HTN, peripheral neuropathy, TBI, DVT, CKD, PVD    Patient Stated Goals  Wants to get up and move more, improve his walking and his balance                       [facility] Adult PT Treatment/Exercise - 11/10/18 1245      Transfers   Transfers  Sit to Stand;Stand to Sit    Sit to Stand  5: Supervision;With upper extremity assist;From bed;From  chair/3-in-1    Stand to Sit  5: Supervision    Stand to Sit Details (indicate cue type and reason)  Verbal cues for precautions/safety;Verbal cues for technique    Stand to Sit Details  Massed practice throughout session. Verbal cues for eccentric control when sitting; using LUE to find mat table and help lower slowly to the mat       Ambulation/Gait   Ambulation/Gait  Yes    Ambulation/Gait Assistance  4: Min guard;4: Min assist    Ambulation/Gait Assistance Details  Min A for increased weight shifting to RLE to clear LLE for a step through pattern; pt demonstrating step to pattern prior. Cueing for heel > toe pattern, with poor carryover, pt reverts back to increased R knee flexion and foot flat during initial contact. Initial cues for keeping RW close by, pt able to keep RW close by throughout gait, with the exceptions of turns.     Ambulation Distance (Feet)  115 Feet    Assistive device  Rolling walker    Gait Pattern  Step-to pattern;Step-through pattern;Decreased stride length;Decreased step length - right;Decreased step  length - left;Right flexed knee in stance;Left flexed knee in stance;Right steppage;Left steppage;Shuffle;Trunk flexed;Poor foot clearance - left;Poor foot clearance - right    Ambulation Surface  Level;Indoor    Gait Comments  2 x 25 ft practicing weaving in and out of 4 cones. Pt needing cueing and min A to keep walker nearby (vs increased forward flexed posture), pt demonstrates decreased stance time on RLE when fatigued and step to pattern      Neuro Re-ed    Neuro Re-ed Details   At edge of blue mat table with min guard and chair for L UE support: 1 x 10 reps B step taps onto 4" step, progressing to 3 x 5 reps B step taps with 2 second hold at top, focusing on weight shifting to R.        Exercises   Other Exercises   With chair in front of pt for BUE support: 2 x 15 reps of heel raises, focus on lowering heels back down slowly to the floor. With 2 lb ankle weight: 2  x 10 reps marching with LUE support on chair and min guard from therapist to work on SLS for gait: cues for weight shifting and also incorporating ankle DF B (R>L) - seated rest breaks throughout exercises.              PT Education - 11/10/18 1451    Education Details  gait training with RW, pt state he drove to appointment today - had conversation with pt (and previous conversation with pt's wife via phone) about having pt's wife drive pt to appointment for increased safety - pt verbalized understanding    Person(s) Educated  Patient    Methods  Explanation;Demonstration    Comprehension  Verbalized understanding;Returned demonstration;Need further instruction       PT Short Term Goals - 10/25/18 3662      PT SHORT TERM GOAL #1   Title  Patient will be independent with initial HEP in order to build upon functional gains in therapy. ALL STGs due 11/24/18    Time  4    Period  Weeks    Status  New    Target Date  11/24/18      PT SHORT TERM GOAL #2   Title  Patient will decrease 5 times sit <> stand score to at least 31 seconds in order to improve functional LE strength.    Baseline  36.84 seconds    Time  4    Period  Weeks    Status  New      PT SHORT TERM GOAL #3   Title  Patient will improve gait speed with RW to at least 1.50 ft/second in order to decrease risk of falls.    Baseline  1.12 ft/sec with RW on 10/25/18    Time  4    Period  Weeks      PT SHORT TERM GOAL #4   Title  Pt and pt's wife will verbalize understanding of fall prevention in home environment.    Time  4    Period  Weeks    Status  New      PT SHORT TERM GOAL #5   Title  Patient will improve BERG score to at least a 37/56 in order to decrease fall risk.    Baseline  31/56 on 10/25/18    Time  4    Period  Weeks    Status  New  PT Long Term Goals - 10/25/18 1230      PT LONG TERM GOAL #1   Title  Patient will be independent with final HEP in order to build upon functional gains made in  therapy. ALL STG TARGET DATE 12/24/18    Time  8    Period  Weeks    Status  New    Target Date  12/24/18      PT LONG TERM GOAL #2   Title  Patient will decrease 5 times sit <> stand score to at least 28 seconds in order to improve functional LE strength.    Baseline  36.84 seconds    Time  8    Period  Weeks      PT LONG TERM GOAL #3   Title  Patient will improve gait speed with RW to at least 1.75 ft/second in order to decrease risk of falls.    Baseline  1.12 ft/sec on 10/25/18    Time  8    Period  Weeks    Status  New      PT LONG TERM GOAL #4   Title  Patient will improve BERG score to at least a 41/56 in order to decrease fall risk.    Baseline  31/56 on 10/25/18    Time  8    Period  Weeks    Status  New      PT LONG TERM GOAL #5   Title  Patient will decrease TUG score to at least 45 seconds in order to decrease future fall risk.    Baseline  55.66 seconds on 10/25/18    Time  8    Period  Weeks    Status  New            Plan - 11/10/18 1452    Clinical Impression Statement  Today's skilled session focused on gait training with RW, LE strengthening and SLS balance. Pt with tendency too push RW too far anteriorly, able to correct initially with verbal cues and good carryover throughout session. Pt needed additional cueing for RW navigation while ambulating around obstacles. Pt with decreased weight shifting to R and episodes of step to pattern throughout gait, focused on SLS today with weight shifting towards RLE. Seated rest breaks provided throughout session d/t increased fatigue. Pt will continue to benefit from skilled PT in order to progress towards LTGs.    Personal Factors and Comorbidities  Age;Comorbidity 3+;Past/Current Experience;Time since onset of injury/illness/exacerbation    Comorbidities  CVA (2001), hypercholesteremia, HTN, peripheral neuropathy, TBI, DVT, CKD, PVD, R THR    Examination-Activity Limitations  Stand;Locomotion Level    Stability/Clinical  Decision Making  Evolving/Moderate complexity    Rehab Potential  Fair    PT Frequency  1x / week   after discussing on phone with wife 1x/week d/t wife's work schedule, pt compliance   PT Duration  8 weeks    PT Treatment/Interventions  ADLs/Self Care Home Management;DME Instruction;Gait training;Stair training;Functional mobility training;Therapeutic activities;Therapeutic exercise;Balance training;Patient/family education;Neuromuscular re-education    PT Next Visit Plan  Weight shifting activties towards R. gait with RW- work on barriers as well such as ramp, curbs, stairs; continue to work on strengthening and balance.    PT Home Exercise Plan  Access Code: [email or web address]    Consulted and Agree with Plan of Care  Patient;Family member/caregiver    Family Member Consulted  discussed with wife Rose via phone       Patient will benefit from  skilled therapeutic intervention in order to improve the following deficits and impairments:  Abnormal gait, Decreased activity tolerance, Decreased balance, Decreased endurance, Decreased coordination, Decreased knowledge of use of DME, Decreased mobility, Decreased range of motion, Decreased safety awareness, Difficulty walking, Decreased strength, Postural dysfunction  Visit Diagnosis: 1. Other abnormalities of gait and mobility   2. History of falling   3. Unsteadiness on feet   4. Muscle weakness (generalized)   5. Difficulty in walking, not elsewhere classified        Problem List Patient Active Problem List   Diagnosis Date Noted  . Bilateral leg pain 04/04/2018  . TIA (transient ischemic attack) 04/02/2018  . Essential hypertension 04/02/2018  . History of stroke 04/02/2018  . History of pulmonary embolus (PE) 04/02/2018  . H/O total hip arthroplasty 10/09/2016  . Laceration of right forearm 10/09/2016  . Long term current use of anticoagulant 02/08/2016  . Right hip pain 09/12/2015  . Abnormality of gait 08/28/2015  . Head trauma  08/28/2015  . Stroke ([place]) 08/28/2015  . Neck pain 08/28/2015  . Low back pain 08/28/2015    Arliss Journey, PT, DPT 11/10/2018, 2:59 PM  [facility] [address] [place], [place], [address] Phone: [phone]   Fax:  [phone]  Name: Steven Guerrero MRN: [id number] Date of Birth: October 20, 1940

## 2018-11-16 ENCOUNTER — Encounter: Payer: Self-pay | Admitting: Physical Therapy

## 2018-11-16 ENCOUNTER — Ambulatory Visit: Payer: Medicare Other | Admitting: Physical Therapy

## 2018-11-16 ENCOUNTER — Other Ambulatory Visit: Payer: Self-pay

## 2018-11-16 DIAGNOSIS — Z9181 History of falling: Secondary | ICD-10-CM | POA: Diagnosis not present

## 2018-11-16 DIAGNOSIS — R2689 Other abnormalities of gait and mobility: Secondary | ICD-10-CM

## 2018-11-16 DIAGNOSIS — R2681 Unsteadiness on feet: Secondary | ICD-10-CM | POA: Diagnosis not present

## 2018-11-16 DIAGNOSIS — M6281 Muscle weakness (generalized): Secondary | ICD-10-CM | POA: Diagnosis not present

## 2018-11-16 DIAGNOSIS — R262 Difficulty in walking, not elsewhere classified: Secondary | ICD-10-CM

## 2018-11-16 NOTE — Therapy (Signed)
[facility] [address] [place], [place], [address] Phone: [phone]   Fax:  [phone]  Physical Therapy Treatment  Patient Details  Name: Steven Guerrero MRN: [id number] Date of Birth: Jun 04, 1940 Referring Provider (PT): Deland Pretty, MD   Encounter Date: 11/16/2018  PT End of Session - 11/16/18 1019    Visit Number  5    Number of Visits  9    Date for PT Re-Evaluation  12/24/18    Authorization Type  [employer] Medicare    PT Start Time  1017    PT Stop Time  1100    PT Time Calculation (min)  43 min    Equipment Utilized During Treatment  Gait belt    Activity Tolerance  Patient tolerated treatment well;Patient limited by fatigue    Behavior During Therapy  [facility] for tasks assessed/performed;Flat affect       Past Medical History:  Diagnosis Date  . Anxiety and depression   . Back pain   . Brain bleed ([place])    2003  . CVA (cerebral infarction)    2001  . Hypercholesteremia   . Hypertension   . Peripheral neuropathy   . Pulmonary embolism ([place])   . Sleep apnea   . Stroke ([place])    2001  . TBI (traumatic brain injury) [facility])     Past Surgical History:  Procedure Laterality Date  . HERNIA REPAIR    . JOINT REPLACEMENT Right    hip replacement  . LEG SURGERY     Broken femur  . TOTAL HIP ARTHROPLASTY     Right    There were no vitals filed for this visit.  Subjective Assessment - 11/16/18 1018    Subjective  No new complaints. No falls or pain to report.    Pertinent History  CVA (2001), hypercholesteremia, HTN, peripheral neuropathy, TBI, DVT, CKD, PVD    Patient Stated Goals  Wants to get up and move more, improve his walking and his balance    Currently in Pain?  No/denies            [facility] Adult PT Treatment/Exercise - 11/16/18 1020      Transfers   Transfers  Sit to Stand;Stand to Sit    Sit to Stand  5: Supervision;With upper extremity assist;From bed;From chair/3-in-1    Stand to Sit   5: Supervision;With upper extremity assist;To bed;To chair/3-in-1      Ambulation/Gait   Ambulation/Gait  Yes    Ambulation/Gait Assistance  4: Min guard;5: Supervision    Ambulation/Gait Assistance Details  cues needed for walker position with gait and for increased step length/height with gait. pt able to self correct for a few steps before needing cues again.    Ambulation Distance (Feet)  80 Feet   x2, plus around the gym with activities   Assistive device  Rolling walker    Gait Pattern  Step-to pattern;Step-through pattern;Decreased stride length;Decreased step length - right;Decreased step length - left;Right flexed knee in stance;Left flexed knee in stance;Right steppage;Left steppage;Shuffle;Trunk flexed;Poor foot clearance - left;Poor foot clearance - right    Ambulation Surface  Level;Indoor      Exercises   Other Exercises   holding onto counter with toes on 2 inch box for heel cord/hamstring stretching, 30 sec holds for 3 reps, min guard assist with cues to keep hips forward for stretching.       Knee/Hip Exercises: Aerobic   Other Aerobic  Scifit UE/LE level 2.0  with goal >/=30 rpm for 8 mintues for strengthening and activity tolerance      Knee/Hip Exercises: Machines for Strengthening   Cybex Knee Extension  :    Cybex Leg Press  80# bil legs: 5 sec holds for 2 sets of 10 reps, then 30# right leg: 2 sets of 10 reps, then 40# left leg 2 sets of 10 reps. cues for slow, controlled movements with each rep. assist needed for controlled return at times with some reps.       Knee/Hip Exercises: Standing   Heel Raises  Both;1 set;10 reps;5 seconds;Limitations    Heel Raises Limitations  with UE support, cues on form/technique    Hip Flexion  AROM;Stengthening;Both;1 set;10 reps;Knee bent;Limitations    Hip Flexion Limitations  2# ankle weights used with UE support, cues on form/technique    Hip Abduction  AROM;Stengthening;Both;1 set;10 reps;Knee straight;Limitations    Abduction  Limitations  2# ankle weights used with UE support, cues on form and technique.     Functional Squat  1 set;10 reps;Limitations    Functional Squat Limitations  mini squats with UE support, cues on form and technique           PT Short Term Goals - 10/25/18 [id number]      PT SHORT TERM GOAL #1   Title  Patient will be independent with initial HEP in order to build upon functional gains in therapy. ALL STGs due 11/24/18    Time  4    Period  Weeks    Status  New    Target Date  11/24/18      PT SHORT TERM GOAL #2   Title  Patient will decrease 5 times sit <> stand score to at least 31 seconds in order to improve functional LE strength.    Baseline  36.84 seconds    Time  4    Period  Weeks    Status  New      PT SHORT TERM GOAL #3   Title  Patient will improve gait speed with RW to at least 1.50 ft/second in order to decrease risk of falls.    Baseline  1.12 ft/sec with RW on 10/25/18    Time  4    Period  Weeks      PT SHORT TERM GOAL #4   Title  Pt and pt's wife will verbalize understanding of fall prevention in home environment.    Time  4    Period  Weeks    Status  New      PT SHORT TERM GOAL #5   Title  Patient will improve BERG score to at least a 37/56 in order to decrease fall risk.    Baseline  31/56 on 10/25/18    Time  4    Period  Weeks    Status  New        PT Long Term Goals - 10/25/18 1230      PT LONG TERM GOAL #1   Title  Patient will be independent with final HEP in order to build upon functional gains made in therapy. ALL STG TARGET DATE 12/24/18    Time  8    Period  Weeks    Status  New    Target Date  12/24/18      PT LONG TERM GOAL #2   Title  Patient will decrease 5 times sit <> stand score to at least 28 seconds in order to improve functional LE  strength.    Baseline  36.84 seconds    Time  8    Period  Weeks      PT LONG TERM GOAL #3   Title  Patient will improve gait speed with RW to at least 1.75 ft/second in order to decrease risk of  falls.    Baseline  1.12 ft/sec on 10/25/18    Time  8    Period  Weeks    Status  New      PT LONG TERM GOAL #4   Title  Patient will improve BERG score to at least a 41/56 in order to decrease fall risk.    Baseline  31/56 on 10/25/18    Time  8    Period  Weeks    Status  New      PT LONG TERM GOAL #5   Title  Patient will decrease TUG score to at least 45 seconds in order to decrease future fall risk.    Baseline  55.66 seconds on 10/25/18    Time  8    Period  Weeks    Status  New            Plan - 11/16/18 1019    Clinical Impression Statement  Today's skilled session continued to focus on gait training with RW and on LE strengthening. The pt is making steady progress toward goals and should benefit from continued PT to progress toward LTGs.    Personal Factors and Comorbidities  Age;Comorbidity 3+;Past/Current Experience;Time since onset of injury/illness/exacerbation    Comorbidities  CVA (2001), hypercholesteremia, HTN, peripheral neuropathy, TBI, DVT, CKD, PVD, R THR    Examination-Activity Limitations  Stand;Locomotion Level    Stability/Clinical Decision Making  Evolving/Moderate complexity    Rehab Potential  Fair    PT Frequency  1x / week   after discussing on phone with wife 1x/week d/t wife's work schedule, pt compliance   PT Duration  8 weeks    PT Treatment/Interventions  ADLs/Self Care Home Management;DME Instruction;Gait training;Stair training;Functional mobility training;Therapeutic activities;Therapeutic exercise;Balance training;Patient/family education;Neuromuscular re-education    PT Next Visit Plan  Weight shifting activties towards R. gait with RW- work on barriers as well such as ramp, curbs, stairs; continue to work on strengthening and balance.    PT Home Exercise Plan  Access Code: [email or web address]    Consulted and Agree with Plan of Care  Patient;Family member/caregiver    Family Member Consulted  discussed with wife Rose via phone       Patient will  benefit from skilled therapeutic intervention in order to improve the following deficits and impairments:  Abnormal gait, Decreased activity tolerance, Decreased balance, Decreased endurance, Decreased coordination, Decreased knowledge of use of DME, Decreased mobility, Decreased range of motion, Decreased safety awareness, Difficulty walking, Decreased strength, Postural dysfunction  Visit Diagnosis: Other abnormalities of gait and mobility  Unsteadiness on feet  Muscle weakness (generalized)  Difficulty in walking, not elsewhere classified  History of falling     Problem List Patient Active Problem List   Diagnosis Date Noted  . Bilateral leg pain 04/04/2018  . TIA (transient ischemic attack) 04/02/2018  . Essential hypertension 04/02/2018  . History of stroke 04/02/2018  . History of pulmonary embolus (PE) 04/02/2018  . H/O total hip arthroplasty 10/09/2016  . Laceration of right forearm 10/09/2016  . Long term current use of anticoagulant 02/08/2016  . Right hip pain 09/12/2015  . Abnormality of gait 08/28/2015  . Head trauma 08/28/2015  .  Stroke ([place]) 08/28/2015  . Neck pain 08/28/2015  . Low back pain 08/28/2015    Willow Ora, PTA, [facility] Outpatient Neuro [facility] [address] [place], [place] [address] [phone] 11/16/18, 10:17 PM   Name: LOXLEY MACLENNAN MRN: [id number] Date of Birth: 1941-03-20

## 2018-11-19 ENCOUNTER — Ambulatory Visit: Payer: Medicare Other | Admitting: Physical Therapy

## 2018-11-22 ENCOUNTER — Ambulatory Visit: Payer: Medicare Other | Admitting: Physical Therapy

## 2018-11-25 ENCOUNTER — Encounter: Payer: Self-pay | Admitting: Physical Therapy

## 2018-11-25 ENCOUNTER — Other Ambulatory Visit: Payer: Self-pay

## 2018-11-25 ENCOUNTER — Ambulatory Visit: Payer: Medicare Other | Attending: Internal Medicine | Admitting: Physical Therapy

## 2018-11-25 DIAGNOSIS — M6281 Muscle weakness (generalized): Secondary | ICD-10-CM | POA: Diagnosis not present

## 2018-11-25 DIAGNOSIS — R262 Difficulty in walking, not elsewhere classified: Secondary | ICD-10-CM

## 2018-11-25 DIAGNOSIS — R2689 Other abnormalities of gait and mobility: Secondary | ICD-10-CM | POA: Insufficient documentation

## 2018-11-25 DIAGNOSIS — R2681 Unsteadiness on feet: Secondary | ICD-10-CM | POA: Diagnosis not present

## 2018-11-26 DIAGNOSIS — Z23 Encounter for immunization: Secondary | ICD-10-CM | POA: Diagnosis not present

## 2018-11-26 NOTE — Therapy (Addendum)
[facility] [address] [place], [place], [address] Phone: [phone]   Fax:  [phone]  Physical Therapy Treatment/ D/C Summary  Patient Details  Name: Steven Guerrero MRN: [id number] Date of Birth: 11-23-1940 Referring Provider (PT): Deland Pretty, MD   Encounter Date: 11/25/2018  PT End of Session - 11/25/18 1030    Visit Number  6    Number of Visits  9    Date for PT Re-Evaluation  12/24/18    Authorization Type  [employer] Medicare    PT Start Time  1029   pt late for appt today   PT Stop Time  1101    PT Time Calculation (min)  32 min    Equipment Utilized During Treatment  Gait belt    Activity Tolerance  Patient tolerated treatment well;Patient limited by fatigue    Behavior During Therapy  [facility] for tasks assessed/performed;Flat affect       Past Medical History:  Diagnosis Date  . Anxiety and depression   . Back pain   . Brain bleed ([place])    2003  . CVA (cerebral infarction)    2001  . Hypercholesteremia   . Hypertension   . Peripheral neuropathy   . Pulmonary embolism ([place])   . Sleep apnea   . Stroke ([place])    2001  . TBI (traumatic brain injury) [facility])     Past Surgical History:  Procedure Laterality Date  . HERNIA REPAIR    . JOINT REPLACEMENT Right    hip replacement  . LEG SURGERY     Broken femur  . TOTAL HIP ARTHROPLASTY     Right    There were no vitals filed for this visit.  Subjective Assessment - 11/25/18 1030    Subjective  No new complaints. No falls or pain to report.    Pertinent History  CVA (2001), hypercholesteremia, HTN, peripheral neuropathy, TBI, DVT, CKD, PVD    Patient Stated Goals  Wants to get up and move more, improve his walking and his balance    Currently in Pain?  No/denies         [facility] PT Assessment - 11/25/18 1032      Transfers   Transfers  Sit to Stand;Stand to Sit    Sit to Stand  5: Supervision;With upper extremity assist;From bed;From chair/3-in-1     Five time sit to stand comments   22.63 sec's from standard chair with UE assist. uncontrolled descent.     Stand to Sit  5: Supervision;With upper extremity assist;To bed;To chair/3-in-1      Ambulation/Gait   Ambulation/Gait  Yes    Ambulation/Gait Assistance  5: Supervision    Assistive device  Rolling walker    Gait Pattern  Step-to pattern;Step-through pattern;Decreased stride length;Decreased step length - right;Decreased step length - left;Right flexed knee in stance;Left flexed knee in stance;Right steppage;Left steppage;Shuffle;Trunk flexed;Poor foot clearance - left;Poor foot clearance - right    Ambulation Surface  Level;Indoor    Gait velocity  18.25 sec's with RW= 1.80 ft/sec       Berg Balance Test   Sit to Stand  Able to stand  independently using hands    Standing Unsupported  Able to stand safely 2 minutes    Sitting with Back Unsupported but Feet Supported on Floor or Stool  Able to sit safely and securely 2 minutes    Stand to Sit  Controls descent by using hands    Transfers  Able  to transfer with verbal cueing and /or supervision    Standing Unsupported with Eyes Closed  Able to stand 10 seconds with supervision    Standing Unsupported with Feet Together  Able to place feet together independently and stand 1 minute safely    From Standing, Reach Forward with Outstretched Arm  Can reach forward >12 cm safely (5")   5 inches   From Standing Position, Pick up Object from Floor  Able to pick up shoe, needs supervision    From Standing Position, Turn to Look Behind Over each Shoulder  Looks behind one side only/other side shows less weight shift   right > left side   Turn 360 Degrees  Able to turn 360 degrees safely but slowly    Standing Unsupported, Alternately Place Feet on Step/Stool  Able to complete >2 steps/needs minimal assist    Standing Unsupported, One Foot in Front  Able to take small step independently and hold 30 seconds    Standing on One Leg  Tries to  lift leg/unable to hold 3 seconds but remains standing independently    Total Score  38    Berg comment:  38/56= significant risk for falls      Timed Up and Go Test   Normal TUG (seconds)  33.19   with RW with supervision        PT Short Term Goals - 11/25/18 1031      PT SHORT TERM GOAL #1   Title  Patient will be independent with initial HEP in order to build upon functional gains in therapy. ALL STGs due 11/24/18    Baseline  11/25/18: met with current program    Status  Achieved    Target Date  11/24/18      PT SHORT TERM GOAL #2   Title  Patient will decrease 5 times sit <> stand score to at least 31 seconds in order to improve functional LE strength.    Baseline  11/25/18: 22.63 sec's from standard height with UE assist, uncontrolled descent.    Time  --    Period  --    Status  Achieved      PT SHORT TERM GOAL #3   Title  Patient will improve gait speed with RW to at least 1.50 ft/second in order to decrease risk of falls.    Baseline  11/25/18: 1.80 ft/sec RW    Time  --    Period  --    Status  Achieved      PT SHORT TERM GOAL #4   Title  Pt and pt's wife will verbalize understanding of fall prevention in home environment.    Baseline  11/25/18: have provided this information.    Status  Achieved      PT SHORT TERM GOAL #5   Title  Patient will improve BERG score to at least a 37/56 in order to decrease fall risk.    Baseline  11/25/18: 38/56 scored today    Time  --    Period  --    Status  Achieved         PT Long Term Goals - 11/26/18 1854      PT LONG TERM GOAL #1   Title  Patient will be independent with final HEP in order to build upon functional gains made in therapy. ALL STG TARGET DATE 12/24/18    Baseline  11/25/18: met with current HEP    Status  Achieved  PT LONG TERM GOAL #2   Title  Patient will decrease 5 times sit <> stand score to at least 28 seconds in order to improve functional LE strength.    Baseline  11/25/18: 22.63 with UE assist from  standard height chair    Status  Achieved      PT LONG TERM GOAL #3   Title  Patient will improve gait speed with RW to at least 1.75 ft/second in order to decrease risk of falls.    Baseline  11/25/18: 1.80 ft/sec with RW    Status  Achieved      PT LONG TERM GOAL #4   Title  Patient will improve BERG score to at least a 41/56 in order to decrease fall risk.    Baseline  11/25/18: 38/56    Status  Not Met      PT LONG TERM GOAL #5   Title  Patient will decrease TUG score to at least 45 seconds in order to decrease future fall risk.    Baseline  11/25/18: 33.19 sec's with RW    Status  Achieved        11/25/18 1030  Plan  Clinical Impression Statement Today's skilled session focused on progress toward STGs with all goals met. Pt stated he did not want to continue with PT at this time, therefore applied today's objective measures toward LTGs with 4/5 goals met.  Personal Factors and Comorbidities Age;Comorbidity 3+;Past/Current Experience;Time since onset of injury/illness/exacerbation  Comorbidities CVA (2001), hypercholesteremia, HTN, peripheral neuropathy, TBI, DVT, CKD, PVD, R THR  Examination-Activity Limitations Stand;Locomotion Level  Pt will benefit from skilled therapeutic intervention in order to improve on the following deficits Abnormal gait;Decreased activity tolerance;Decreased balance;Decreased endurance;Decreased coordination;Decreased knowledge of use of DME;Decreased mobility;Decreased range of motion;Decreased safety awareness;Difficulty walking;Decreased strength;Postural dysfunction  Stability/Clinical Decision Making Evolving/Moderate complexity  Rehab Potential Fair  PT Frequency 1x / week (after discussing on phone with wife 1x/week d/t wife's work schedule, pt compliance)  PT Duration 8 weeks  PT Treatment/Interventions ADLs/Self Care Home Management;DME Instruction;Gait training;Stair training;Functional mobility training;Therapeutic activities;Therapeutic  exercise;Balance training;Patient/family education;Neuromuscular re-education  PT Next Visit Plan discharge at pt request  PT Home Exercise Plan Access Code: [email or web address]  Consulted and Agree with Plan of Care Patient      PHYSICAL THERAPY DISCHARGE SUMMARY  Visits from Start of Care: 6  Current functional level related to goals / functional outcomes: See LTGs above.    Remaining deficits: Balance impairments, decreased LE strength, gait abnormalities with RW.    Education / Equipment: HEP for LE strengthening and balance.  Plan: Patient agrees to discharge.  Patient goals were partially met. Patient is being discharged due to the patient's request.  ?????        Patient will benefit from skilled therapeutic intervention in order to improve the following deficits and impairments:  Abnormal gait, Decreased activity tolerance, Decreased balance, Decreased endurance, Decreased coordination, Decreased knowledge of use of DME, Decreased mobility, Decreased range of motion, Decreased safety awareness, Difficulty walking, Decreased strength, Postural dysfunction  Visit Diagnosis: Other abnormalities of gait and mobility  Unsteadiness on feet  Muscle weakness (generalized)  Difficulty in walking, not elsewhere classified     Problem List Patient Active Problem List   Diagnosis Date Noted  . Bilateral leg pain 04/04/2018  . TIA (transient ischemic attack) 04/02/2018  . Essential hypertension 04/02/2018  . History of stroke 04/02/2018  . History of pulmonary embolus (PE) 04/02/2018  . H/O total hip arthroplasty 10/09/2016  .  Laceration of right forearm 10/09/2016  . Long term current use of anticoagulant 02/08/2016  . Right hip pain 09/12/2015  . Abnormality of gait 08/28/2015  . Head trauma 08/28/2015  . Stroke ([place]) 08/28/2015  . Neck pain 08/28/2015  . Low back pain 08/28/2015   Willow Ora, PTA, [facility] Outpatient Neuro [facility] [address], [place], [place] [address] [phone] 11/26/18, 8:56 PM   Name: Steven Guerrero MRN: [id number] Date of Birth: 18-Feb-1941    Janann August, PT, DPT 12/08/18 1:02 PM

## 2018-12-07 DIAGNOSIS — D225 Melanocytic nevi of trunk: Secondary | ICD-10-CM | POA: Diagnosis not present

## 2018-12-07 DIAGNOSIS — L820 Inflamed seborrheic keratosis: Secondary | ICD-10-CM | POA: Diagnosis not present

## 2018-12-07 DIAGNOSIS — D0461 Carcinoma in situ of skin of right upper limb, including shoulder: Secondary | ICD-10-CM | POA: Diagnosis not present

## 2018-12-07 DIAGNOSIS — X32XXXD Exposure to sunlight, subsequent encounter: Secondary | ICD-10-CM | POA: Diagnosis not present

## 2018-12-07 DIAGNOSIS — L570 Actinic keratosis: Secondary | ICD-10-CM | POA: Diagnosis not present

## 2018-12-07 DIAGNOSIS — L821 Other seborrheic keratosis: Secondary | ICD-10-CM | POA: Diagnosis not present

## 2018-12-22 DIAGNOSIS — Z8673 Personal history of transient ischemic attack (TIA), and cerebral infarction without residual deficits: Secondary | ICD-10-CM | POA: Diagnosis not present

## 2018-12-22 DIAGNOSIS — I10 Essential (primary) hypertension: Secondary | ICD-10-CM | POA: Diagnosis not present

## 2018-12-22 DIAGNOSIS — M25521 Pain in right elbow: Secondary | ICD-10-CM | POA: Diagnosis not present

## 2018-12-22 DIAGNOSIS — E7800 Pure hypercholesterolemia, unspecified: Secondary | ICD-10-CM | POA: Diagnosis not present

## 2018-12-22 DIAGNOSIS — Z7901 Long term (current) use of anticoagulants: Secondary | ICD-10-CM | POA: Diagnosis not present

## 2019-04-16 ENCOUNTER — Emergency Department (HOSPITAL_COMMUNITY)
Admission: EM | Admit: 2019-04-16 | Discharge: 2019-04-16 | Disposition: A | Discharge status: Home / Self Care | Payer: Medicare Other | Attending: Emergency Medicine | Admitting: Emergency Medicine

## 2019-04-16 ENCOUNTER — Encounter (HOSPITAL_COMMUNITY): Payer: Self-pay

## 2019-04-16 ENCOUNTER — Other Ambulatory Visit: Payer: Self-pay

## 2019-04-16 ENCOUNTER — Ambulatory Visit: Payer: Medicare Other

## 2019-04-16 ENCOUNTER — Emergency Department (HOSPITAL_COMMUNITY): Payer: Medicare Other

## 2019-04-16 DIAGNOSIS — Z87891 Personal history of nicotine dependence: Secondary | ICD-10-CM | POA: Insufficient documentation

## 2019-04-16 DIAGNOSIS — S0083XA Contusion of other part of head, initial encounter: Secondary | ICD-10-CM | POA: Diagnosis not present

## 2019-04-16 DIAGNOSIS — Z8673 Personal history of transient ischemic attack (TIA), and cerebral infarction without residual deficits: Secondary | ICD-10-CM | POA: Diagnosis not present

## 2019-04-16 DIAGNOSIS — Y92009 Unspecified place in unspecified non-institutional (private) residence as the place of occurrence of the external cause: Secondary | ICD-10-CM | POA: Insufficient documentation

## 2019-04-16 DIAGNOSIS — Y939 Activity, unspecified: Secondary | ICD-10-CM | POA: Diagnosis not present

## 2019-04-16 DIAGNOSIS — Y999 Unspecified external cause status: Secondary | ICD-10-CM | POA: Diagnosis not present

## 2019-04-16 DIAGNOSIS — Z743 Need for continuous supervision: Secondary | ICD-10-CM | POA: Diagnosis not present

## 2019-04-16 DIAGNOSIS — S0181XA Laceration without foreign body of other part of head, initial encounter: Secondary | ICD-10-CM | POA: Diagnosis not present

## 2019-04-16 DIAGNOSIS — Z79899 Other long term (current) drug therapy: Secondary | ICD-10-CM | POA: Diagnosis not present

## 2019-04-16 DIAGNOSIS — W19XXXA Unspecified fall, initial encounter: Secondary | ICD-10-CM | POA: Insufficient documentation

## 2019-04-16 DIAGNOSIS — Z86711 Personal history of pulmonary embolism: Secondary | ICD-10-CM | POA: Insufficient documentation

## 2019-04-16 DIAGNOSIS — Z7901 Long term (current) use of anticoagulants: Secondary | ICD-10-CM | POA: Diagnosis not present

## 2019-04-16 DIAGNOSIS — W010XXA Fall on same level from slipping, tripping and stumbling without subsequent striking against object, initial encounter: Secondary | ICD-10-CM

## 2019-04-16 DIAGNOSIS — S0990XA Unspecified injury of head, initial encounter: Secondary | ICD-10-CM | POA: Insufficient documentation

## 2019-04-16 DIAGNOSIS — I10 Essential (primary) hypertension: Secondary | ICD-10-CM | POA: Insufficient documentation

## 2019-04-16 MED ORDER — LIDOCAINE-EPINEPHRINE (PF) 2 %-1:200000 IJ SOLN
20.0000 mL | Freq: Once | INTRAMUSCULAR | Status: AC
  Administered 2019-04-16: 20 mL
  Filled 2019-04-16: qty 20

## 2019-04-16 MED ORDER — BACITRACIN ZINC 500 UNIT/GM EX OINT: TOPICAL_OINTMENT | Freq: Once | CUTANEOUS | Status: AC

## 2019-04-16 NOTE — Discharge Instructions (Addendum)
It was our pleasure to provide your ER care today - we hope that you feel better.  Keep wound very clean/dry. May shower, pad area gently dry.   Have sutures removed, your doctor or urgent care, in 7-9 days.   Icepack/cold to sore area. Take acetaminophen as need.  Fall precautions.  Return to ER if worse, new symptoms, infection of wound, fevers, new or severe pain, severe headache, vomiting, numbness/weakness, or other concern.

## 2019-04-16 NOTE — ED Provider Notes (Signed)
[place] EMERGENCY DEPARTMENT Provider Note   CSN: [id number] Arrival date & time: 04/16/19  1639     History Chief Complaint  Patient presents with  . Fall    Steven Guerrero is a 79 y.o. male.  Patient presents via EMS s/p fall at home. States slipped, which caused him to fall forward, hit head. Laceration to forehead. Is on anticoag therapy. Last tetanus was < 5 yrs ago. Sharp pain localized to laceration site, mild, constant, persistent. No severe headaches. No neck or back pain. No chest pain or discomfort. No sob. No abd pain or nvd. No gu c/o. Denies neck or back pain, no radicular pain. No numbness/weakness.   The history is provided by the patient and the EMS personnel.  Fall Pertinent negatives include no chest pain, no abdominal pain and no shortness of breath.       Past Medical History:  Diagnosis Date  . Anxiety and depression   . Back pain   . Brain bleed ([place])    2003  . CVA (cerebral infarction)    2001  . Hypercholesteremia   . Hypertension   . Peripheral neuropathy   . Pulmonary embolism ([place])   . Sleep apnea   . Stroke ([place])    2001  . TBI (traumatic brain injury) [facility])     Patient Active Problem List   Diagnosis Date Noted  . Bilateral leg pain 04/04/2018  . TIA (transient ischemic attack) 04/02/2018  . Essential hypertension 04/02/2018  . History of stroke 04/02/2018  . History of pulmonary embolus (PE) 04/02/2018  . H/O total hip arthroplasty 10/09/2016  . Laceration of right forearm 10/09/2016  . Long term current use of anticoagulant 02/08/2016  . Right hip pain 09/12/2015  . Abnormality of gait 08/28/2015  . Head trauma 08/28/2015  . Stroke ([place]) 08/28/2015  . Neck pain 08/28/2015  . Low back pain 08/28/2015    Past Surgical History:  Procedure Laterality Date  . HERNIA REPAIR    . JOINT REPLACEMENT Right    hip replacement  . LEG SURGERY     Broken femur  . TOTAL HIP ARTHROPLASTY     Right        Family History  Problem Relation Age of Onset  . Heart attack Father        Mother - health hx unknown    Social History   Tobacco Use  . Smoking status: Former Smoker    Packs/day: 1.50    Years: 47.00    Pack years: 70.50    Types: Cigarettes    Quit date: 03/24/2006    Years since quitting: 13.0  . Smokeless tobacco: Never Used  Substance Use Topics  . Alcohol use: Yes    Alcohol/week: 0.0 standard drinks    Comment: 1 beer per day  . Drug use: No    Home Medications Prior to Admission medications   Medication Sig Start Date End Date Taking? Authorizing Provider  amLODipine (NORVASC) 10 MG tablet Take 10 mg by mouth daily.    [provider]  atorvastatin (LIPITOR) 40 MG tablet Take 40 mg by mouth daily.    [provider]  baclofen (LIORESAL) 10 MG tablet TAKE 1 TABLET BY MOUTH AT  BEDTIME Patient taking differently: Take 10 mg by mouth at bedtime as needed for muscle spasms.  10/20/16   Gerda Diss, DO  citalopram (CELEXA) 40 MG tablet Take 40 mg by mouth daily.    [provider]  fish oil-omega-3 fatty acids 1000 MG capsule Take 1 g by mouth daily.    [provider]  gabapentin (NEURONTIN) 100 MG capsule Take 100 mg by mouth 3 (three) times daily.  08/09/15   [provider]  Multiple Vitamin (MULTIVITAMIN WITH MINERALS) TABS tablet Take 1 tablet by mouth daily.    [provider]  OVER THE COUNTER MEDICATION Take 1 tablet by mouth daily. Vitamin for vision **areds**    [provider]  pantoprazole (PROTONIX) 40 MG tablet Take 40 mg by mouth daily.  02/15/16   [provider]  rivaroxaban (XARELTO) 20 MG TABS tablet Take 1 tablet (20 mg total) by mouth daily with supper. 04/05/18   Geradine Girt, DO  traZODone (DESYREL) 50 MG tablet Take 50 mg by mouth at bedtime.    [provider]  vitamin B-12 (CYANOCOBALAMIN) 1000 MCG tablet Take 1,000 mcg by mouth daily.    [provider]    Allergies    Atenolol, Lotensin [benazepril hcl], and Wellbutrin [bupropion]  Review of Systems   Review of Systems  Constitutional: Negative for fever.  HENT: Negative for nosebleeds.   Eyes: Negative for pain and visual disturbance.  Respiratory: Negative for shortness of breath.   Cardiovascular: Negative for chest pain.  Gastrointestinal: Negative for abdominal pain, nausea and vomiting.  Genitourinary: Negative for flank pain.  Musculoskeletal: Negative for back pain and neck pain.  Skin: Positive for wound.  Neurological: Negative for weakness and numbness.  Hematological:       +anticoag therapy  Psychiatric/Behavioral: Negative for confusion.    Physical Exam Updated Vital Signs BP (!) 152/78 (BP Location: Right Arm) Comment: Simultaneous filing. User may not have seen previous data.  Pulse 81 Comment: Simultaneous filing. User may not have seen previous data.  Temp 97.8 F (36.6 C) (Oral)   Resp 14   SpO2 95% Comment: Simultaneous filing. User may not have seen previous data.  Physical Exam Vitals and nursing note reviewed.  Constitutional:      Appearance: Normal appearance. He is well-developed.  HENT:     Head:     Comments: 4 cm laceration to right forehead.     Nose: Nose normal.     Mouth/Throat:     Mouth: Mucous membranes are moist.     Pharynx: Oropharynx is clear.  Eyes:     General: No scleral icterus.    Conjunctiva/sclera: Conjunctivae normal.     Pupils: Pupils are equal, round, and reactive to light.  Neck:     Trachea: No tracheal deviation.  Cardiovascular:     Rate and Rhythm: Normal rate and regular rhythm.     Pulses: Normal pulses.     Heart sounds: Normal heart sounds. No murmur. No friction rub. No gallop.   Pulmonary:     Effort: Pulmonary effort is normal. No accessory muscle usage or respiratory distress.     Breath sounds: Normal breath sounds.  Abdominal:     General: Bowel sounds are normal. There is no  distension.     Palpations: Abdomen is soft.     Tenderness: There is no abdominal tenderness. There is no guarding.  Genitourinary:    Comments: No cva tenderness. Musculoskeletal:        General: No swelling or tenderness.     Cervical back: Normal range of motion and neck supple. No rigidity.     Comments: CTLS spine, non tender, aligned, no step off. Good rom  bil ext without pain or focal bony tenderness.   Skin:    General: Skin is warm and dry.     Findings: No rash.  Neurological:     Mental Status: He is alert.     Comments: Alert, speech clear. Motor/sens grossly intact bil. Steady gait.   Psychiatric:        Mood and Affect: Mood normal.     ED Results / Procedures / Treatments   Labs (all labs ordered are listed, but only abnormal results are displayed) Labs Reviewed - No data to display  EKG None  Radiology CT HEAD WO CONTRAST  Result Date: 04/16/2019 CLINICAL DATA:  Head trauma, headache. Additional history provided by technologist: Patient fell at home and struck head, laceration to forehead, denies loss of consciousness, on blood thinner EXAM: CT HEAD WITHOUT CONTRAST TECHNIQUE: Contiguous axial images were obtained from the base of the skull through the vertex without intravenous contrast. COMPARISON:  CT head 09/29/2018 FINDINGS: Brain: No evidence of acute intracranial hemorrhage. No demarcated cortical infarction. No evidence of intracranial mass. No midline shift or extra-axial fluid collection. Again demonstrated are multiple chronic lacunar infarcts within the deep white matter, basal ganglia and left thalamus. Background moderate patchy and confluent hypodensity within the cerebral white matter is nonspecific, but consistent with chronic small vessel ischemic disease. Moderate/advanced generalized parenchymal atrophy. Vascular: No hyperdense vessel.  Atherosclerotic calcifications. Skull: Normal. Negative for fracture or focal lesion. Sinuses/Orbits: Visualized  orbits demonstrate no acute abnormality. No significant paranasal sinus disease or mastoid effusion at the imaged levels. Other: Right forehead hematoma. IMPRESSION: No evidence of acute intracranial abnormality. Multiple chronic lacunar infarcts are redemonstrated. Stable background chronic small vessel ischemic disease and generalized parenchymal atrophy. Right forehead hematoma. Electronically Signed   By: Kellie Simmering DO   On: 04/16/2019 17:55    Procedures .Marland KitchenLaceration Repair  Date/Time: 04/16/2019 5:16 PM Performed by: Lajean Saver, MD Authorized by: Lajean Saver, MD   Consent:    Consent given by:  Patient Anesthesia (see MAR for exact dosages):    Anesthesia method:  Local infiltration   Local anesthetic:  Lidocaine 2% WITH epi Laceration details:    Location:  Face   Face location:  Forehead   Length (cm):  4 Repair type:    Repair type:  Intermediate Pre-procedure details:    Preparation:  Patient was prepped and draped in usual sterile fashion Exploration:    Wound exploration: entire depth of wound probed and visualized     Contaminated: no   Treatment:    Area cleansed with:  Saline   Amount of cleaning:  Standard   Irrigation solution:  Sterile saline Subcutaneous repair:    Suture size:  5-0   Suture material:  Vicryl   Number of sutures:  2 Skin repair:    Repair method:  Sutures   Suture size:  6-0   Suture material:  Prolene   Number of sutures:  8 Post-procedure details:    Dressing:  Antibiotic ointment   Patient tolerance of procedure:  Tolerated well, no immediate complications   (including critical care time)  Medications Ordered in ED Medications  lidocaine-EPINEPHrine (XYLOCAINE W/EPI) 2 %-1:200000 (PF) injection 20 mL (20 mLs Infiltration Given 04/16/19 1721)  bacitracin ointment ( Topical Given 04/16/19 1730)    ED Course  I have reviewed the triage vital signs and the nursing notes.  Pertinent labs & imaging results that were available  during my care of the patient were reviewed by  me and considered in my medical decision making (see chart for details).    MDM Rules/Calculators/A&P                      CT ordered.  Reviewed nursing notes and prior charts for additional history.   Laceration repaired. Bacitracin and sterile dressing.   CT reviewed/interpreted by me - no hem.   Patient comfortable appearing and requests d/c.   Patient currently stable for d/c.  Return precautions provided.       Final Clinical Impression(s) / ED Diagnoses Final diagnoses:  Fall from slip, trip, or stumble, initial encounter  Laceration of forehead, initial encounter  Contusion of forehead, initial encounter    Rx / DC Orders ED Discharge Orders    None       Lajean Saver, MD 04/16/19 1818

## 2019-04-16 NOTE — ED Triage Notes (Signed)
Pt bib [facility] from home after fall. Pt on blood thinners, no LOC, at baseline neuro. Pt hit head on floor and has approx 2' lac over R eye. Pt has hx CVA with R sided weakness, slurred speech, and R sided facial droop. Per EMS pt is at his baseline neuro status currently.  Pt denies pain. EMS VSS.  BP 146/78 HR 70 SPO2 97% on RA TEMP 98.1 F RR 20

## 2019-04-18 ENCOUNTER — Ambulatory Visit: Payer: Medicare Other

## 2019-04-25 DIAGNOSIS — Z4802 Encounter for removal of sutures: Secondary | ICD-10-CM | POA: Diagnosis not present

## 2019-04-25 DIAGNOSIS — S0181XA Laceration without foreign body of other part of head, initial encounter: Secondary | ICD-10-CM | POA: Diagnosis not present

## 2019-05-13 DIAGNOSIS — E7800 Pure hypercholesterolemia, unspecified: Secondary | ICD-10-CM | POA: Diagnosis not present

## 2019-05-13 DIAGNOSIS — I10 Essential (primary) hypertension: Secondary | ICD-10-CM | POA: Diagnosis not present

## 2019-05-17 DIAGNOSIS — Z86718 Personal history of other venous thrombosis and embolism: Secondary | ICD-10-CM | POA: Diagnosis not present

## 2019-05-17 DIAGNOSIS — Z0001 Encounter for general adult medical examination with abnormal findings: Secondary | ICD-10-CM | POA: Diagnosis not present

## 2019-05-17 DIAGNOSIS — I10 Essential (primary) hypertension: Secondary | ICD-10-CM | POA: Diagnosis not present

## 2019-05-17 DIAGNOSIS — I6990 Unspecified sequelae of unspecified cerebrovascular disease: Secondary | ICD-10-CM | POA: Diagnosis not present

## 2019-05-17 DIAGNOSIS — I6529 Occlusion and stenosis of unspecified carotid artery: Secondary | ICD-10-CM | POA: Diagnosis not present

## 2019-05-26 DIAGNOSIS — I10 Essential (primary) hypertension: Secondary | ICD-10-CM | POA: Diagnosis not present

## 2019-05-26 DIAGNOSIS — G629 Polyneuropathy, unspecified: Secondary | ICD-10-CM | POA: Diagnosis not present

## 2019-05-26 DIAGNOSIS — Z8673 Personal history of transient ischemic attack (TIA), and cerebral infarction without residual deficits: Secondary | ICD-10-CM | POA: Diagnosis not present

## 2019-05-26 DIAGNOSIS — E7800 Pure hypercholesterolemia, unspecified: Secondary | ICD-10-CM | POA: Diagnosis not present

## 2019-06-01 DIAGNOSIS — X32XXXD Exposure to sunlight, subsequent encounter: Secondary | ICD-10-CM | POA: Diagnosis not present

## 2019-06-01 DIAGNOSIS — L820 Inflamed seborrheic keratosis: Secondary | ICD-10-CM | POA: Diagnosis not present

## 2019-06-01 DIAGNOSIS — L570 Actinic keratosis: Secondary | ICD-10-CM | POA: Diagnosis not present

## 2019-06-20 DIAGNOSIS — Z961 Presence of intraocular lens: Secondary | ICD-10-CM | POA: Diagnosis not present

## 2019-06-20 DIAGNOSIS — H52223 Regular astigmatism, bilateral: Secondary | ICD-10-CM | POA: Diagnosis not present

## 2019-09-09 ENCOUNTER — Other Ambulatory Visit: Payer: Self-pay

## 2019-09-09 ENCOUNTER — Encounter (HOSPITAL_COMMUNITY): Payer: Self-pay | Admitting: Pediatrics

## 2019-09-09 ENCOUNTER — Emergency Department (HOSPITAL_COMMUNITY): Payer: Medicare Other

## 2019-09-09 ENCOUNTER — Emergency Department (HOSPITAL_COMMUNITY)
Admission: EM | Admit: 2019-09-09 | Discharge: 2019-09-09 | Disposition: A | Discharge status: Home / Self Care | Payer: Medicare Other | Attending: Emergency Medicine | Admitting: Emergency Medicine

## 2019-09-09 DIAGNOSIS — Z79899 Other long term (current) drug therapy: Secondary | ICD-10-CM | POA: Insufficient documentation

## 2019-09-09 DIAGNOSIS — Z7901 Long term (current) use of anticoagulants: Secondary | ICD-10-CM | POA: Diagnosis not present

## 2019-09-09 DIAGNOSIS — Z8673 Personal history of transient ischemic attack (TIA), and cerebral infarction without residual deficits: Secondary | ICD-10-CM | POA: Diagnosis not present

## 2019-09-09 DIAGNOSIS — W19XXXS Unspecified fall, sequela: Secondary | ICD-10-CM

## 2019-09-09 DIAGNOSIS — Z86711 Personal history of pulmonary embolism: Secondary | ICD-10-CM | POA: Diagnosis not present

## 2019-09-09 DIAGNOSIS — R410 Disorientation, unspecified: Secondary | ICD-10-CM | POA: Diagnosis not present

## 2019-09-09 DIAGNOSIS — S0990XA Unspecified injury of head, initial encounter: Secondary | ICD-10-CM | POA: Diagnosis not present

## 2019-09-09 DIAGNOSIS — R4781 Slurred speech: Secondary | ICD-10-CM | POA: Diagnosis not present

## 2019-09-09 DIAGNOSIS — I10 Essential (primary) hypertension: Secondary | ICD-10-CM | POA: Insufficient documentation

## 2019-09-09 DIAGNOSIS — Y92009 Unspecified place in unspecified non-institutional (private) residence as the place of occurrence of the external cause: Secondary | ICD-10-CM

## 2019-09-09 DIAGNOSIS — R29818 Other symptoms and signs involving the nervous system: Secondary | ICD-10-CM | POA: Diagnosis not present

## 2019-09-09 DIAGNOSIS — J90 Pleural effusion, not elsewhere classified: Secondary | ICD-10-CM | POA: Diagnosis not present

## 2019-09-09 LAB — URINALYSIS, ROUTINE W REFLEX MICROSCOPIC
Bacteria, UA: NONE SEEN
Bilirubin Urine: NEGATIVE
Glucose, UA: NEGATIVE mg/dL
Hgb urine dipstick: NEGATIVE
Ketones, ur: NEGATIVE mg/dL
Leukocytes,Ua: NEGATIVE
Nitrite: NEGATIVE
Protein, ur: NEGATIVE mg/dL
Specific Gravity, Urine: 1.006 (ref 1.005–1.030)
pH: 6 (ref 5.0–8.0)

## 2019-09-09 LAB — RAPID URINE DRUG SCREEN, HOSP PERFORMED
Amphetamines: NOT DETECTED
Barbiturates: NOT DETECTED
Benzodiazepines: NOT DETECTED
Cocaine: NOT DETECTED
Opiates: NOT DETECTED
Tetrahydrocannabinol: NOT DETECTED

## 2019-09-09 LAB — I-STAT VENOUS BLOOD GAS, ED
Acid-Base Excess: 2 mmol/L (ref 0.0–2.0)
Bicarbonate: 26.5 mmol/L (ref 20.0–28.0)
Calcium, Ion: 1.16 mmol/L (ref 1.15–1.40)
HCT: 42 % (ref 39.0–52.0)
Hemoglobin: 14.3 g/dL (ref 13.0–17.0)
O2 Saturation: 86 %
Potassium: 4.4 mmol/L (ref 3.5–5.1)
Sodium: 142 mmol/L (ref 135–145)
TCO2: 28 mmol/L (ref 22–32)
pCO2, Ven: 41.2 mmHg — ABNORMAL LOW (ref 44.0–60.0)
pH, Ven: 7.417 (ref 7.250–7.430)
pO2, Ven: 51 mmHg — ABNORMAL HIGH (ref 32.0–45.0)

## 2019-09-09 LAB — CBC WITH DIFFERENTIAL/PLATELET
Abs Immature Granulocytes: 0.02 10*3/uL (ref 0.00–0.07)
Basophils Absolute: 0.1 10*3/uL (ref 0.0–0.1)
Basophils Relative: 1 %
Eosinophils Absolute: 0.2 10*3/uL (ref 0.0–0.5)
Eosinophils Relative: 3 %
HCT: 42.8 % (ref 39.0–52.0)
Hemoglobin: 13.9 g/dL (ref 13.0–17.0)
Immature Granulocytes: 0 %
Lymphocytes Relative: 19 %
Lymphs Abs: 1.2 10*3/uL (ref 0.7–4.0)
MCH: 31.2 pg (ref 26.0–34.0)
MCHC: 32.5 g/dL (ref 30.0–36.0)
MCV: 96 fL (ref 80.0–100.0)
Monocytes Absolute: 0.6 10*3/uL (ref 0.1–1.0)
Monocytes Relative: 9 %
Neutro Abs: 4.4 10*3/uL (ref 1.7–7.7)
Neutrophils Relative %: 68 %
Platelets: 225 10*3/uL (ref 150–400)
RBC: 4.46 MIL/uL (ref 4.22–5.81)
RDW: 13.9 % (ref 11.5–15.5)
WBC: 6.4 10*3/uL (ref 4.0–10.5)
nRBC: 0 % (ref 0.0–0.2)

## 2019-09-09 LAB — I-STAT CHEM 8, ED
BUN: 23 mg/dL (ref 8–23)
Calcium, Ion: 1.18 mmol/L (ref 1.15–1.40)
Chloride: 105 mmol/L (ref 98–111)
Creatinine, Ser: 1.9 mg/dL — ABNORMAL HIGH (ref 0.61–1.24)
Glucose, Bld: 112 mg/dL — ABNORMAL HIGH (ref 70–99)
HCT: 41 % (ref 39.0–52.0)
Hemoglobin: 13.9 g/dL (ref 13.0–17.0)
Potassium: 4.7 mmol/L (ref 3.5–5.1)
Sodium: 140 mmol/L (ref 135–145)
TCO2: 27 mmol/L (ref 22–32)

## 2019-09-09 LAB — COMPREHENSIVE METABOLIC PANEL
ALT: 20 U/L (ref 0–44)
AST: 25 U/L (ref 15–41)
Albumin: 3.8 g/dL (ref 3.5–5.0)
Alkaline Phosphatase: 73 U/L (ref 38–126)
Anion gap: 10 (ref 5–15)
BUN: 17 mg/dL (ref 8–23)
CO2: 23 mmol/L (ref 22–32)
Calcium: 9.3 mg/dL (ref 8.9–10.3)
Chloride: 107 mmol/L (ref 98–111)
Creatinine, Ser: 1.66 mg/dL — ABNORMAL HIGH (ref 0.61–1.24)
GFR calc Af Amer: 44 mL/min — ABNORMAL LOW (ref >60)
GFR calc non Af Amer: 38 mL/min — ABNORMAL LOW (ref >60)
Glucose, Bld: 120 mg/dL — ABNORMAL HIGH (ref 70–99)
Potassium: 4.3 mmol/L (ref 3.5–5.1)
Sodium: 140 mmol/L (ref 135–145)
Total Bilirubin: 1.3 mg/dL — ABNORMAL HIGH (ref 0.3–1.2)
Total Protein: 6.9 g/dL (ref 6.5–8.1)

## 2019-09-09 LAB — APTT: aPTT: 51 seconds — ABNORMAL HIGH (ref 24–36)

## 2019-09-09 LAB — ETHANOL: Alcohol, Ethyl (B): <10 mg/dL (ref <10)

## 2019-09-09 LAB — PROTIME-INR
INR: 3 — ABNORMAL HIGH (ref 0.8–1.2)
Prothrombin Time: 30.3 seconds — ABNORMAL HIGH (ref 11.4–15.2)

## 2019-09-09 LAB — AMMONIA: Ammonia: 14 umol/L (ref 9–35)

## 2019-09-09 NOTE — ED Provider Notes (Signed)
[place] EMERGENCY DEPARTMENT Provider Note   CSN: [id number] Arrival date & time: 09/09/19  4765  An emergency department physician performed an initial assessment on this suspected stroke patient at [place] (per EDP note. I had not arrived yet at that point.).  History Chief Complaint  Patient presents with  . Fall    Steven Guerrero is a 79 y.o. male.  HPI Patient got up at 5 AM per usual and took his medications.  His wife reports she watched him walk out to the kitchen and walked back.  He was walking in a normal fashion.  He typically gets up uses his walker, can set it aside to do small tasks and then resume using it.  At 6 AM, he got up and had difficulty using his walker.  He was incoordinated and required additional help.  She reports his speech was slurred.  (At baseline he has slurred speech due to old traumatic brain injury.  She estimates his speech is 50% worse than baseline).  Normally he would use the walker and go to the garage and then walk out of the garage and change walkers.  She had to help him the entire way guiding him and answering repetitive questions.  She reports he asked multiple times where they were going.  She reports it is atypical.  His short-term memory has some deficits but is functional.  She did not note any 1 focal weakness of arm or leg but noted significant change in his ability to function ambulating with his walker.  Patient did fall within the last several days.  He has a minor brow lack.  He does not complain of headache.  Patient answers questions but speech is very slurred and difficult to understand.    Past Medical History:  Diagnosis Date  . Anxiety and depression   . Back pain   . Brain bleed ([place])    2003  . CVA (cerebral infarction)    2001  . Hypercholesteremia   . Hypertension   . Peripheral neuropathy   . Pulmonary embolism ([place])   . Sleep apnea   . Stroke ([place])    2001  . TBI (traumatic brain injury) [facility])      Patient Active Problem List   Diagnosis Date Noted  . Bilateral leg pain 04/04/2018  . TIA (transient ischemic attack) 04/02/2018  . Essential hypertension 04/02/2018  . History of stroke 04/02/2018  . History of pulmonary embolus (PE) 04/02/2018  . H/O total hip arthroplasty 10/09/2016  . Laceration of right forearm 10/09/2016  . Long term current use of anticoagulant 02/08/2016  . Right hip pain 09/12/2015  . Abnormality of gait 08/28/2015  . Head trauma 08/28/2015  . Stroke ([place]) 08/28/2015  . Neck pain 08/28/2015  . Low back pain 08/28/2015    Past Surgical History:  Procedure Laterality Date  . HERNIA REPAIR    . JOINT REPLACEMENT Right    hip replacement  . LEG SURGERY     Broken femur  . TOTAL HIP ARTHROPLASTY     Right       Family History  Problem Relation Age of Onset  . Heart attack Father        Mother - health hx unknown    Social History   Tobacco Use  . Smoking status: Former Smoker    Packs/day: 1.50    Years: 47.00    Pack years: 70.50    Types: Cigarettes    Quit date:  03/24/2006    Years since quitting: 13.4  . Smokeless tobacco: Never Used  Vaping Use  . Vaping Use: Never used  Substance Use Topics  . Alcohol use: Yes    Alcohol/week: 0.0 standard drinks    Comment: 1 beer per day  . Drug use: No    Home Medications Prior to Admission medications   Medication Sig Start Date End Date Taking? Authorizing Provider  amLODipine (NORVASC) 10 MG tablet Take 10 mg by mouth daily.   Yes [provider]  atorvastatin (LIPITOR) 20 MG tablet Take 20 mg by mouth daily.    Yes [provider]  baclofen (LIORESAL) 10 MG tablet TAKE 1 TABLET BY MOUTH AT  BEDTIME Patient taking differently: Take 10 mg by mouth at bedtime as needed for muscle spasms.  10/20/16  Yes Gerda Diss, DO  citalopram (CELEXA) 40 MG tablet Take 40 mg by mouth at bedtime.    Yes [provider]  Evolocumab (REPATHA) 140 MG/ML SOSY Inject 140  mg into the skin every 14 (fourteen) days. Tuesday   Yes [provider]  fish oil-omega-3 fatty acids 1000 MG capsule Take 1 g by mouth daily.   Yes [provider]  gabapentin (NEURONTIN) 100 MG capsule Take 100-200 mg by mouth See admin instructions. Taking 100mg  in the AM and 2 capsules (200mg ) at 1200, then 200mg  at night. 08/09/15  Yes [provider]  losartan (COZAAR) 100 MG tablet Take 100 mg by mouth daily. 07/16/19  Yes [provider]  Multiple Vitamin (MULTIVITAMIN WITH MINERALS) TABS tablet Take 1 tablet by mouth daily.   Yes [provider]  OVER THE COUNTER MEDICATION Take 1 tablet by mouth daily. Vitamin for vision **areds**   Yes [provider]  pantoprazole (PROTONIX) 40 MG tablet Take 40 mg by mouth daily.  02/15/16  Yes [provider]  rivaroxaban (XARELTO) 20 MG TABS tablet Take 1 tablet (20 mg total) by mouth daily with supper. 04/05/18  Yes Vann, Jessica U, DO  traZODone (DESYREL) 50 MG tablet Take 50 mg by mouth at bedtime.   Yes [provider]  vitamin B-12 (CYANOCOBALAMIN) 1000 MCG tablet Take 1,000 mcg by mouth daily.   Yes [provider]    Allergies    Atenolol, Lotensin [benazepril hcl], and Wellbutrin [bupropion]  Review of Systems   Review of Systems  Physical Exam Updated Vital Signs BP 118/81   Pulse 62   Temp 98.7 F (37.1 C) (Oral)   Resp 18   SpO2 94%   Physical Exam Constitutional:      Comments: Awake and alert.  No respiratory distress.  No airway compromise.  HENT:     Head:     Comments: Minor healing laceration to the left brow.    Mouth/Throat:     Comments: Airway clear. Eyes:     Extraocular Movements: Extraocular movements intact.  Pulmonary:     Effort: Pulmonary effort is normal.  Musculoskeletal:        General: Normal range of motion.  Skin:    General: Skin is warm and dry.  Neurological:     Comments: Patient is alert.  Answering  questions.  Speech is very slurred.  Right mouth droop with grimace.  No perceivable difference in grip strength right to left.  Patient can elevate each lower extremity off of the bed and hold.     ED Results / Procedures / Treatments   Labs (all labs ordered are  listed, but only abnormal results are displayed) Labs Reviewed  COMPREHENSIVE METABOLIC PANEL - Abnormal; Notable for the following components:      Result Value   Glucose, Bld 120 (*)    Creatinine, Ser 1.66 (*)    Total Bilirubin 1.3 (*)    GFR calc non Af Amer 38 (*)    GFR calc Af Amer 44 (*)    All other components within normal limits  PROTIME-INR - Abnormal; Notable for the following components:   Prothrombin Time 30.3 (*)    INR 3.0 (*)    All other components within normal limits  APTT - Abnormal; Notable for the following components:   aPTT 51 (*)    All other components within normal limits  URINALYSIS, ROUTINE W REFLEX MICROSCOPIC - Abnormal; Notable for the following components:   Color, Urine STRAW (*)    All other components within normal limits  I-STAT CHEM 8, ED - Abnormal; Notable for the following components:   Creatinine, Ser 1.90 (*)    Glucose, Bld 112 (*)    All other components within normal limits  I-STAT VENOUS BLOOD GAS, ED - Abnormal; Notable for the following components:   pCO2, Ven 41.2 (*)    pO2, Ven 51.0 (*)    All other components within normal limits  CBC WITH DIFFERENTIAL/PLATELET  ETHANOL  RAPID URINE DRUG SCREEN, HOSP PERFORMED  AMMONIA  BLOOD GAS, VENOUS    EKG EKG Interpretation  Date/Time:  Friday September 09 2019 07:46:59 EDT Ventricular Rate:  62 PR Interval:  158 QRS Duration: 118 QT Interval:  442 QTC Calculation: 448 R Axis:   24 Text Interpretation: Normal sinus rhythm Incomplete right bundle branch block Cannot rule out Anterior infarct , age undetermined Abnormal ECG NO SIG CHANGE FROM PREVIOUS Confirmed by Charlesetta Shanks [phone]) on 09/09/2019 12:16:47  PM   Radiology DG Chest 2 View  Result Date: 09/09/2019 CLINICAL DATA:  Recent fall with altered mental status EXAM: CHEST - 2 VIEW COMPARISON:  09/29/2018 FINDINGS: Cardiac shadow is enlarged but stable. Aortic calcifications are again seen. Calcified pleural plaques are again noted bilaterally. No focal infiltrate or sizable effusion is seen. No acute bony abnormality is noted. IMPRESSION: Chronic calcified pleural plaques. No acute abnormality noted. Electronically Signed   By: Inez Catalina M.D.   On: 09/09/2019 10:43   MR BRAIN WO CONTRAST  Result Date: 09/09/2019 CLINICAL DATA:  Stroke follow-up. Recent fall with head injury. On Xarelto. EXAM: MRI HEAD WITHOUT CONTRAST TECHNIQUE: Multiplanar, multiecho pulse sequences of the brain and surrounding structures were obtained without intravenous contrast. COMPARISON:  CT head 09/09/2019 FINDINGS: Brain: Negative for acute infarct. Generalized atrophy most prominent the frontal lobes. Ventricular enlargement consistent with atrophy. Atrophy is moderately severe. Negative for acute infarct. Extensive chronic microvascular ischemic changes throughout the white matter, basal ganglia, pons, cerebellum. Negative for hemorrhage or mass. Vascular: Normal arterial flow voids. Skull and upper cervical spine: No focal skeletal lesion. Sinuses/Orbits: Paranasal sinuses show mild mucosal edema. Bilateral cataract extraction Other: None IMPRESSION: Negative for acute infarct Atrophy and extensive chronic microvascular ischemic change These results were called by telephone at the time of interpretation on 09/09/2019 at 9:38 am to provider [facility] , who verbally acknowledged these results. Electronically Signed   By: Franchot Gallo M.D.   On: 09/09/2019 09:38   CT HEAD CODE STROKE WO CONTRAST  Result Date: 09/09/2019 CLINICAL DATA:  Code stroke. Ataxia. Generalized weakness with multiple falls. On Xarelto EXAM: CT HEAD WITHOUT  CONTRAST TECHNIQUE: Contiguous axial  images were obtained from the base of the skull through the vertex without intravenous contrast. COMPARISON:  CT head 04/16/2019 FINDINGS: Brain: Moderate atrophy. Extensive chronic microvascular ischemic changes in the white matter bilaterally. Chronic infarcts in the basal ganglia and thalamus and pons bilaterally. Negative for acute infarct, hemorrhage, mass. Vascular: Negative for hyperdense vessel Skull: Negative Sinuses/Orbits: Paranasal sinuses clear. No orbital lesion. Bilateral cataract extraction. Other: None ASPECTS ([place] Stroke Program Early CT Score) - Ganglionic level infarction (caudate, lentiform nuclei, internal capsule, insula, M1-M3 cortex): 7 - Supraganglionic infarction (M4-M6 cortex): 3 Total score (0-10 with 10 being normal): 10 IMPRESSION: 1. No acute abnormality 2. ASPECTS is 10 3. Atrophy and extensive chronic small vessel ischemia. 4. Results texted to Dr. Rory Percy Electronically Signed   By: Franchot Gallo M.D.   On: 09/09/2019 08:51    Procedures Procedures (including critical care time) CRITICAL CARE Performed by: Charlesetta Shanks   Total critical care time: 30 minutes  Critical care time was exclusive of separately billable procedures and treating other patients.  Critical care was necessary to treat or prevent imminent or life-threatening deterioration.  Critical care was time spent personally by me on the following activities: development of treatment plan with patient and/or surrogate as well as nursing, discussions with consultants, evaluation of patient's response to treatment, examination of patient, obtaining history from patient or surrogate, ordering and performing treatments and interventions, ordering and review of laboratory studies, ordering and review of radiographic studies, pulse oximetry and re-evaluation of patient's condition. Medications Ordered in ED Medications - No data to display  ED Course  I have reviewed the triage vital signs and the  nursing notes.  Pertinent labs & imaging results that were available during my care of the patient were reviewed by me and considered in my medical decision making (see chart for details).    MDM Rules/Calculators/A&P                          Patient's last seen normal was 5 AM per his wife watching him walk and use his walker at baseline.  At 6 AM she noticed distinct change in functional capacity to ambulate and a 50% increase in degree of slurred speech.  Also she noted significant change in short-term memory.  Patient is alert.  He is answering questions.  He does have grossly slurred speech.  He follows commands appropriately.  Will initiate code stroke based on speech and gait changes acutely with 5 AM last seen normal.  Evaluation for acute stroke negative.  No sign of infectious etiology for a metabolic type encephalopathy.  Patient has been taking baclofen in the evenings for about the past 3 nights.  This may be adverse medication reaction with incoordination and confusion.  At this time, I recommend the discontinuation of baclofen.  Patient is alert and appropriate.  Stable for discharge currently.  I have reviewed return precautions and follow-up plan with the patient and his wife. Final Clinical Impression(s) / ED Diagnoses Final diagnoses:  Slurred speech  Confusion  Fall in home, sequela    Rx / DC Orders ED Discharge Orders    None       Charlesetta Shanks, MD 09/09/19 1218

## 2019-09-09 NOTE — Discharge Instructions (Addendum)
Stop taking Baclofen, it can cause incoordination and confusion. RETURN TO THE EMERGENCY DEPARTMENT IF YOU HAVE NEW, WORSENING OR CONCERNING SYMPTOMS.

## 2019-09-09 NOTE — Code Documentation (Signed)
Pt was brought to ED for urgent evaluation of dysarthria. A code stroke was called at [phone] while the pt was undergoing a Stat CT head. Per Neurologist, the CT was negative for acute hemorrhage. Pt was then taken to MRI at 0841. His NIHSS was 2, for facial asymmetry and dysarthria. Treatment decision was made at [facility] when Dr. Rory Percy evaluated the MRI. Pt was returned to ED room 2. His vital signs and mNIHSS will be monitored every 2 hours for the next 12 hours. Pt is not a candidate for IV TPA because he is taking Eliquis. Pt is not a candidate for endovascular treatment as his exam is LVO negative. Bedside handoff completed with ED RN Raquel Sarna. Work-up for non-neurological causes of dysarthria underway.

## 2019-09-09 NOTE — ED Triage Notes (Signed)
Wife at bedside; stated patient fell last Tuesday and hit head; on Xarelto. Then patient fell again last night w/ no head injury. Wife concerned for worsening slurred speech and right sided facial droop. Reported hx of stroke 2000.

## 2019-09-09 NOTE — Consult Note (Signed)
Neurology Consultation  Reason for Consult: Code stroke for slurred speech Referring Physician: Dr. Tomasa Rand  CC: Slurred speech  History is obtained from: Patient, chart  HPI: Steven Guerrero is a 79 y.o. male past medical history of TBI, PE, anxiety and depression, was brought into the emergency room for emergent evaluation of worsening slurred speech. At baseline, patient has some dysarthria and some right-sided weakness. He was noted to be more dysarthric this morning when he woke up and took his medications including Xarelto. In the emergency room, he was noted to be dysarthric and a code stroke was called. He was evaluated in the CT scanner by me.  His NIH stroke scale was a 2-1 for a very subtle left facial asymmetry which disappears when he smiles and 1 for dysarthria. Being on Xarelto precluded him from being a candidate for IV TPA and his examination had no signs of large vessel occlusion hence vessel studies were not performed. I did take him for a stat MRI because of concerns for acute worsening of his speech. There is also been history provided by family that he has been having frequent falls over the past few days to weeks.  LKW: 0 500 this morning tpa given?: no, on anticoagulant Premorbid modified Rankin scale (mRS): 4  ROS: Performed and negative except noted in HPI.  Past Medical History:  Diagnosis Date  . Anxiety and depression   . Back pain   . Brain bleed ([place])    2003  . CVA (cerebral infarction)    2001  . Hypercholesteremia   . Hypertension   . Peripheral neuropathy   . Pulmonary embolism ([place])   . Sleep apnea   . Stroke ([place])    2001  . TBI (traumatic brain injury) ([place])      Family History  Problem Relation Age of Onset  . Heart attack Father        Mother - health hx unknown    Social History:   reports that he quit smoking about 13 years ago. His smoking use included cigarettes. He has a 70.50 pack-year smoking history. He has never  used smokeless tobacco. He reports current alcohol use. He reports that he does not use drugs. Medications No current facility-administered medications for this encounter.  Current Outpatient Medications:  .  amLODipine (NORVASC) 10 MG tablet, Take 10 mg by mouth daily., Disp: , Rfl:  .  atorvastatin (LIPITOR) 40 MG tablet, Take 40 mg by mouth daily., Disp: , Rfl:  .  baclofen (LIORESAL) 10 MG tablet, TAKE 1 TABLET BY MOUTH AT  BEDTIME (Patient taking differently: Take 10 mg by mouth at bedtime as needed for muscle spasms. ), Disp: 90 tablet, Rfl: 1 .  citalopram (CELEXA) 40 MG tablet, Take 40 mg by mouth daily., Disp: , Rfl:  .  fish oil-omega-3 fatty acids 1000 MG capsule, Take 1 g by mouth daily., Disp: , Rfl:  .  gabapentin (NEURONTIN) 100 MG capsule, Take 100 mg by mouth 3 (three) times daily. , Disp: , Rfl: 1 .  Multiple Vitamin (MULTIVITAMIN WITH MINERALS) TABS tablet, Take 1 tablet by mouth daily., Disp: , Rfl:  .  OVER THE COUNTER MEDICATION, Take 1 tablet by mouth daily. Vitamin for vision **areds**, Disp: , Rfl:  .  pantoprazole (PROTONIX) 40 MG tablet, Take 40 mg by mouth daily. , Disp: , Rfl:  .  rivaroxaban (XARELTO) 20 MG TABS tablet, Take 1 tablet (20 mg total) by mouth daily with supper., Disp:  30 tablet, Rfl: 0 .  traZODone (DESYREL) 50 MG tablet, Take 50 mg by mouth at bedtime., Disp: , Rfl:  .  vitamin B-12 (CYANOCOBALAMIN) 1000 MCG tablet, Take 1,000 mcg by mouth daily., Disp: , Rfl:    Exam: Current vital signs: BP 118/81   Pulse 62   Temp 98.7 F (37.1 C) (Oral)   Resp 18   SpO2 94%  Vital signs in last 24 hours: Temp:  [98.7 F (37.1 C)] 98.7 F (37.1 C) (06/18 0753) Pulse Rate:  [62] 62 (06/18 0753) Resp:  [18] 18 (06/18 0753) BP: (118)/(81) 118/81 (06/18 0753) SpO2:  [94 %] 94 % (06/18 0753) General: Awake alert in no distress HEENT: Cephalic atraumatic Lungs: Clear Cardiovascular: Regular rate rhythm Abdomen soft nondistended nontender Extremities  warm well perfused Neurological exam Awake alert oriented x3 Speech is moderately dysarthric-that is baseline Has subtle facial asymmetry which disappears on smiling-it appeared that there was left lower facial weakness but has right facial droop documented in the past. Cranial nerves: Pupils equal round react light, extraocular movements intact, visual fields are full, facial symmetry is disrupted as above. Motor exam: No drift in any of the 4 extremities although lower extremity exam is limited by some pain and arthritis. Sensory exam: He is able to distinguish light touch on both lower extremities, does have documented peripheral neuropathy with glove and stocking kind of sensory loss. Coordination: No dysmetria Gait testing was deferred NIH stroke scale-2  Labs I have reviewed labs in epic and the results pertinent to this consultation are:  CBC    Component Value Date/Time   WBC 6.4 09/09/2019 0758   RBC 4.46 09/09/2019 0758   HGB 13.9 09/09/2019 0851   HCT 41.0 09/09/2019 0851   PLT 225 09/09/2019 0758   MCV 96.0 09/09/2019 0758   MCH 31.2 09/09/2019 0758   MCHC 32.5 09/09/2019 0758   RDW 13.9 09/09/2019 0758   LYMPHSABS 1.2 09/09/2019 0758   MONOABS 0.6 09/09/2019 0758   EOSABS 0.2 09/09/2019 0758   BASOSABS 0.1 09/09/2019 0758   CMP     Component Value Date/Time   NA 140 09/09/2019 0851   K 4.7 09/09/2019 0851   CL 105 09/09/2019 0851   CO2 23 09/09/2019 0758   GLUCOSE 112 (H) 09/09/2019 0851   BUN 23 09/09/2019 0851   CREATININE 1.90 (H) 09/09/2019 0851   CALCIUM 9.3 09/09/2019 0758   PROT 6.9 09/09/2019 0758   ALBUMIN 3.8 09/09/2019 0758   AST 25 09/09/2019 0758   ALT 20 09/09/2019 0758   ALKPHOS 73 09/09/2019 0758   BILITOT 1.3 (H) 09/09/2019 0758   GFRNONAA 38 (L) 09/09/2019 0758   GFRAA 44 (L) 09/09/2019 0758   Imaging I have reviewed the images obtained: CT head-no acute changes.  No bleed MRI brain-negative  Assessment: 79 year old man with  above past medical history brought in for evaluation of worsening slurred speech and frequent falls. Code stroke activated due to last known normal being within 4-1/2 hours of presentation. On my examination, and comparing back to prior neurological examinations documented from early part of this year, his examination does not seem much different.  He has some residual weaknesses as described above and has moderate to severe dysarthria at times which still is present. Subjective worsening of dysarthria might be due to underlying toxic or metabolic derangements-for which I will recommend further work-up per primary team. MRI-negative  Impression: Worsening dysarthria-on top of some baseline dysarthria. Less likely to be stroke More  likely to be some end of underlying toxic metabolic etiology leading to worsening or recrudescence of residual neurological symptoms.  Recommendations: -Check urinalysis -Check chest x-ray -CBC and BMP are not very impressive. -I will also check an ammonia level -PT OT -No further neurological work up Outpatient f/u for the ongoing gait difficulty.  Please call neurology with questions.  -- Amie Portland, MD [facility] Pager: [phone]

## 2019-09-09 NOTE — Progress Notes (Signed)
Met pt at MRI at 0841. Pt CT head complete. Current NIHSS 2 for  Flattened Right nasolabial fold and dysarthria. Pt into scanner. Will notify Dr Rory Percy when scan complete.

## 2019-09-12 DIAGNOSIS — R2681 Unsteadiness on feet: Secondary | ICD-10-CM | POA: Diagnosis not present

## 2019-09-12 DIAGNOSIS — Z8673 Personal history of transient ischemic attack (TIA), and cerebral infarction without residual deficits: Secondary | ICD-10-CM | POA: Diagnosis not present

## 2019-09-12 DIAGNOSIS — M792 Neuralgia and neuritis, unspecified: Secondary | ICD-10-CM | POA: Diagnosis not present

## 2019-10-12 DIAGNOSIS — I10 Essential (primary) hypertension: Secondary | ICD-10-CM | POA: Diagnosis not present

## 2019-10-12 DIAGNOSIS — Z86711 Personal history of pulmonary embolism: Secondary | ICD-10-CM | POA: Diagnosis not present

## 2019-10-12 DIAGNOSIS — Z7901 Long term (current) use of anticoagulants: Secondary | ICD-10-CM | POA: Diagnosis not present

## 2019-10-12 DIAGNOSIS — Z8673 Personal history of transient ischemic attack (TIA), and cerebral infarction without residual deficits: Secondary | ICD-10-CM | POA: Diagnosis not present

## 2019-11-15 DIAGNOSIS — B351 Tinea unguium: Secondary | ICD-10-CM | POA: Diagnosis not present

## 2020-01-03 DIAGNOSIS — Z23 Encounter for immunization: Secondary | ICD-10-CM | POA: Diagnosis not present

## 2020-01-03 DIAGNOSIS — H6123 Impacted cerumen, bilateral: Secondary | ICD-10-CM | POA: Diagnosis not present

## 2020-05-16 DIAGNOSIS — I10 Essential (primary) hypertension: Secondary | ICD-10-CM | POA: Diagnosis not present

## 2020-05-21 DIAGNOSIS — I6523 Occlusion and stenosis of bilateral carotid arteries: Secondary | ICD-10-CM | POA: Diagnosis not present

## 2020-05-21 DIAGNOSIS — I6990 Unspecified sequelae of unspecified cerebrovascular disease: Secondary | ICD-10-CM | POA: Diagnosis not present

## 2020-05-21 DIAGNOSIS — Z0001 Encounter for general adult medical examination with abnormal findings: Secondary | ICD-10-CM | POA: Diagnosis not present

## 2020-05-21 DIAGNOSIS — Z86711 Personal history of pulmonary embolism: Secondary | ICD-10-CM | POA: Diagnosis not present

## 2020-05-21 DIAGNOSIS — J439 Emphysema, unspecified: Secondary | ICD-10-CM | POA: Diagnosis not present

## 2020-05-21 DIAGNOSIS — E538 Deficiency of other specified B group vitamins: Secondary | ICD-10-CM | POA: Diagnosis not present

## 2020-05-21 DIAGNOSIS — N1832 Chronic kidney disease, stage 3b: Secondary | ICD-10-CM | POA: Diagnosis not present

## 2020-05-21 DIAGNOSIS — B351 Tinea unguium: Secondary | ICD-10-CM | POA: Diagnosis not present

## 2020-05-21 DIAGNOSIS — M109 Gout, unspecified: Secondary | ICD-10-CM | POA: Diagnosis not present

## 2020-05-21 DIAGNOSIS — I10 Essential (primary) hypertension: Secondary | ICD-10-CM | POA: Diagnosis not present

## 2020-05-21 DIAGNOSIS — E7800 Pure hypercholesterolemia, unspecified: Secondary | ICD-10-CM | POA: Diagnosis not present

## 2020-06-19 DIAGNOSIS — Z86711 Personal history of pulmonary embolism: Secondary | ICD-10-CM | POA: Diagnosis not present

## 2020-06-19 DIAGNOSIS — R2681 Unsteadiness on feet: Secondary | ICD-10-CM | POA: Diagnosis not present

## 2020-06-19 DIAGNOSIS — I10 Essential (primary) hypertension: Secondary | ICD-10-CM | POA: Diagnosis not present

## 2020-06-19 DIAGNOSIS — Z8673 Personal history of transient ischemic attack (TIA), and cerebral infarction without residual deficits: Secondary | ICD-10-CM | POA: Diagnosis not present

## 2020-06-19 DIAGNOSIS — E7800 Pure hypercholesterolemia, unspecified: Secondary | ICD-10-CM | POA: Diagnosis not present

## 2020-09-09 ENCOUNTER — Other Ambulatory Visit: Payer: Self-pay

## 2020-09-09 ENCOUNTER — Emergency Department (HOSPITAL_COMMUNITY)
Admission: EM | Admit: 2020-09-09 | Discharge: 2020-09-10 | Disposition: A | Discharge status: Home / Self Care | Payer: Medicare Other | Attending: Emergency Medicine | Admitting: Emergency Medicine

## 2020-09-09 ENCOUNTER — Encounter (HOSPITAL_COMMUNITY): Payer: Self-pay

## 2020-09-09 DIAGNOSIS — Z743 Need for continuous supervision: Secondary | ICD-10-CM | POA: Diagnosis not present

## 2020-09-09 DIAGNOSIS — Z79899 Other long term (current) drug therapy: Secondary | ICD-10-CM | POA: Diagnosis not present

## 2020-09-09 DIAGNOSIS — Z96641 Presence of right artificial hip joint: Secondary | ICD-10-CM | POA: Diagnosis not present

## 2020-09-09 DIAGNOSIS — Z23 Encounter for immunization: Secondary | ICD-10-CM | POA: Insufficient documentation

## 2020-09-09 DIAGNOSIS — S51811A Laceration without foreign body of right forearm, initial encounter: Secondary | ICD-10-CM

## 2020-09-09 DIAGNOSIS — Z043 Encounter for examination and observation following other accident: Secondary | ICD-10-CM | POA: Diagnosis not present

## 2020-09-09 DIAGNOSIS — I10 Essential (primary) hypertension: Secondary | ICD-10-CM | POA: Diagnosis not present

## 2020-09-09 DIAGNOSIS — R58 Hemorrhage, not elsewhere classified: Secondary | ICD-10-CM | POA: Diagnosis not present

## 2020-09-09 DIAGNOSIS — S59911A Unspecified injury of right forearm, initial encounter: Secondary | ICD-10-CM | POA: Diagnosis present

## 2020-09-09 DIAGNOSIS — Z86711 Personal history of pulmonary embolism: Secondary | ICD-10-CM | POA: Insufficient documentation

## 2020-09-09 DIAGNOSIS — Z87891 Personal history of nicotine dependence: Secondary | ICD-10-CM | POA: Diagnosis not present

## 2020-09-09 DIAGNOSIS — Z7901 Long term (current) use of anticoagulants: Secondary | ICD-10-CM | POA: Diagnosis not present

## 2020-09-09 DIAGNOSIS — W010XXA Fall on same level from slipping, tripping and stumbling without subsequent striking against object, initial encounter: Secondary | ICD-10-CM | POA: Insufficient documentation

## 2020-09-09 DIAGNOSIS — R0902 Hypoxemia: Secondary | ICD-10-CM | POA: Diagnosis not present

## 2020-09-09 DIAGNOSIS — W19XXXA Unspecified fall, initial encounter: Secondary | ICD-10-CM | POA: Diagnosis not present

## 2020-09-09 MED ORDER — TETANUS-DIPHTH-ACELL PERTUSSIS 5-2.5-18.5 LF-MCG/0.5 IM SUSY
0.5000 mL | PREFILLED_SYRINGE | Freq: Once | INTRAMUSCULAR | Status: AC
  Administered 2020-09-10: 0.5 mL via INTRAMUSCULAR
  Filled 2020-09-09: qty 0.5

## 2020-09-09 MED ORDER — LIDOCAINE-EPINEPHRINE (PF) 2 %-1:200000 IJ SOLN
20.0000 mL | Freq: Once | INTRAMUSCULAR | Status: AC
  Administered 2020-09-10: 20 mL
  Filled 2020-09-09: qty 20

## 2020-09-09 NOTE — ED Provider Notes (Signed)
Emergency Medicine Provider Triage Evaluation Note  [place] , a 80 y.o. male  was evaluated in triage.  Pt complains of fall and injury to right arm with bleeding.  He is anticoagulated.  Denies hitting his head or LOC. Speech is baseline (slow) 2/2 prior strokes per family.  States he tripped and fell.  Sustained laceration to right anterior forearm..  Review of Systems  Positive: Laceration, fall Negative: Head injury  Physical Exam  BP 114/69 (BP Location: Left Arm)   Pulse 72   Temp (!) 97.4 F (36.3 C) (Oral)   Resp 18   SpO2 97%  Gen:   Awake, no distress   Resp:  Normal effort  MSK:   Moves extremities without difficulty  Other:  Large gaping 6 to 7 cm semicircular laceration to the right anterior forearm, the wound was undressed in triage, there is no evidence of arterial bleeding, but is having persistent slow steady venous bleeding.  Pressure dressing was applied.  There was no apparent involvement of any muscle, tendon, nerve, or artery  Medical Decision Making  Medically screening exam initiated at 11:33 PM.  Appropriate orders placed.  Kazuma A Kujawa was informed that the remainder of the evaluation will be completed by another provider, this initial triage assessment does not replace that evaluation, and the importance of remaining in the ED until their evaluation is complete.  Patient with deep laceration to right forearm.  Will need repair.  Will check plain films.  No evidence of arterial, tendon, or nerve injury.    Montine Circle, PA-C 09/09/20 2344    Palumbo, April, MD 09/10/20 3354

## 2020-09-09 NOTE — ED Triage Notes (Signed)
Patient arrives with [facility], tripping and fell tonight, R arm laceration, patient is on Xarelto, denies hitting head or LOC, hx of stroke, family reports he is at baseline mental status and has slurred speech related to prior stroke

## 2020-09-10 ENCOUNTER — Emergency Department (HOSPITAL_COMMUNITY): Payer: Medicare Other

## 2020-09-10 DIAGNOSIS — Z043 Encounter for examination and observation following other accident: Secondary | ICD-10-CM | POA: Diagnosis not present

## 2020-09-10 NOTE — Discharge Instructions (Addendum)
Keep your dressing in place for 24 hours.  After this time you may remove it and replace it with a simple bandage.  We do recommend that the area remain covered for the next few days.  Change the dressing at least once per day to keep it clean and dry.  Avoid soaking your wound in stagnant or dirty water such as while taking a bath. You can shower normally. Clean with mild soap and warm water. Do not apply peroxide or alcohol to your wound as this can break down newly forming skin and prolong wound healing. Have your staples removed in 10-14 days. You may return for new or concerning symptoms.

## 2020-09-10 NOTE — ED Provider Notes (Signed)
[facility] EMERGENCY DEPARTMENT Provider Note   CSN: [id number] Arrival date & time: 09/09/20  2247     History Chief Complaint  Patient presents with   Fall   Extremity Laceration    Steven Guerrero is a 80 y.o. male.   80 year old male with a history of hypertension, hyperlipidemia, pulmonary embolus (on chronic Xarelto) presents to the emergency department for evaluation of wound to his left forearm.  Reports he cut his arm on the corner of a table after a mechanical fall.  He had no head trauma or loss of consciousness.  Bleeding to R forearm has been persistent and heavy since onset.  No medications taken PTA.  The history is provided by the patient and a relative. No language interpreter was used.  Fall      Past Medical History:  Diagnosis Date   Anxiety and depression    Back pain    Brain bleed ([place])    2003   CVA (cerebral infarction)    2001   Hypercholesteremia    Hypertension    Peripheral neuropathy    Pulmonary embolism ([place])    Sleep apnea    Stroke [facility])    2001   TBI (traumatic brain injury) [facility])     Patient Active Problem List   Diagnosis Date Noted   Bilateral leg pain 04/04/2018   TIA (transient ischemic attack) 04/02/2018   Essential hypertension 04/02/2018   History of stroke 04/02/2018   History of pulmonary embolus (PE) 04/02/2018   H/O total hip arthroplasty 10/09/2016   Laceration of right forearm 10/09/2016   Long term current use of anticoagulant 02/08/2016   Right hip pain 09/12/2015   Abnormality of gait 08/28/2015   Head trauma 08/28/2015   Stroke ([place]) 08/28/2015   Neck pain 08/28/2015   Low back pain 08/28/2015    Past Surgical History:  Procedure Laterality Date   HERNIA REPAIR     JOINT REPLACEMENT Right    hip replacement   LEG SURGERY     Broken femur   TOTAL HIP ARTHROPLASTY     Right       Family History  Problem Relation Age of Onset   Heart attack Father        Mother - health hx  unknown    Social History   Tobacco Use   Smoking status: Former    Packs/day: 1.50    Years: 47.00    Pack years: 70.50    Types: Cigarettes    Quit date: 03/24/2006    Years since quitting: 14.4   Smokeless tobacco: Never  Vaping Use   Vaping Use: Never used  Substance Use Topics   Alcohol use: Yes    Alcohol/week: 0.0 standard drinks    Comment: 1 beer per day   Drug use: No    Home Medications Prior to Admission medications   Medication Sig Start Date End Date Taking? Authorizing Provider  amLODipine (NORVASC) 10 MG tablet Take 10 mg by mouth daily.    [provider]  atorvastatin (LIPITOR) 20 MG tablet Take 20 mg by mouth daily.     [provider]  baclofen (LIORESAL) 10 MG tablet TAKE 1 TABLET BY MOUTH AT  BEDTIME Patient taking differently: Take 10 mg by mouth at bedtime as needed for muscle spasms.  10/20/16   Gerda Diss, DO  citalopram (CELEXA) 40 MG tablet Take 40 mg by mouth at bedtime.     [provider]  Evolocumab (REPATHA) 140 MG/ML SOSY Inject 140 mg into the skin every 14 (fourteen) days. Tuesday    [provider]  fish oil-omega-3 fatty acids 1000 MG capsule Take 1 g by mouth daily.    [provider]  gabapentin (NEURONTIN) 100 MG capsule Take 100-200 mg by mouth See admin instructions. Taking 100mg  in the AM and 2 capsules (200mg ) at 1200, then 200mg  at night. 08/09/15   [provider]  losartan (COZAAR) 100 MG tablet Take 100 mg by mouth daily. 07/16/19   [provider]  Multiple Vitamin (MULTIVITAMIN WITH MINERALS) TABS tablet Take 1 tablet by mouth daily.    [provider]  OVER THE COUNTER MEDICATION Take 1 tablet by mouth daily. Vitamin for vision **areds**    [provider]  pantoprazole (PROTONIX) 40 MG tablet Take 40 mg by mouth daily.  02/15/16   [provider]  rivaroxaban (XARELTO) 20 MG TABS tablet Take 1 tablet (20 mg total) by mouth daily with  supper. 04/05/18   Geradine Girt, DO  traZODone (DESYREL) 50 MG tablet Take 50 mg by mouth at bedtime.    [provider]  vitamin B-12 (CYANOCOBALAMIN) 1000 MCG tablet Take 1,000 mcg by mouth daily.    [provider]    Allergies    Atenolol, Lotensin [benazepril hcl], and Wellbutrin [bupropion]  Review of Systems   Review of Systems Ten systems reviewed and are negative for acute change, except as noted in the HPI.    Physical Exam Updated Vital Signs BP 127/66 (BP Location: Left Arm)   Pulse 88   Temp 97.6 F (36.4 C) (Oral)   Resp 18   Ht 5' 10.5" (1.791 m)   Wt 88 kg   SpO2 96%   BMI 27.44 kg/m   Physical Exam Vitals and nursing note reviewed.  Constitutional:      General: He is not in acute distress.    Appearance: He is well-developed. He is not diaphoretic.     Comments: Nontoxic appearing and in NAD  HENT:     Head: Normocephalic and atraumatic.  Eyes:     General: No scleral icterus.    Conjunctiva/sclera: Conjunctivae normal.  Pulmonary:     Effort: Pulmonary effort is normal. No respiratory distress.     Comments: Respirations even and unlabored Musculoskeletal:        General: Normal range of motion.     Cervical back: Normal range of motion.     Comments: Large gaping wound to forearm with venous bleeding which is persistent and steady. No palpable, pulsatile bleeding. Mild TTP at the site of injury. Preserved ROM in the RUE.  Skin:    General: Skin is warm and dry.     Coloration: Skin is not pale.     Findings: No erythema or rash.  Neurological:     Mental Status: He is alert and oriented to person, place, and time.     Coordination: Coordination normal.  Psychiatric:        Behavior: Behavior normal.    ED Results / Procedures / Treatments   Labs (all labs ordered are listed, but only abnormal results are displayed) Labs Reviewed - No data to display  EKG None  Radiology DG Forearm Right  Result Date:  09/10/2020 CLINICAL DATA:  Fall EXAM: RIGHT FOREARM - 2 VIEW COMPARISON:  None. FINDINGS: There is no evidence of fracture or other focal bone lesions. Soft tissues are somewhat obscured by bandage material.  IMPRESSION: No fracture or dislocation of the right forearm. Electronically Signed   By: Ulyses Jarred M.D.   On: 09/10/2020 01:03     Procedures .Marland KitchenLaceration Repair  Date/Time: 09/10/2020 3:50 AM Performed by: Antonietta Breach, PA-C Authorized by: Antonietta Breach, PA-C   Consent:    Consent obtained:  Emergent situation and verbal   Consent given by:  Patient   Risks, benefits, and alternatives were discussed: yes     Risks discussed:  Pain, poor cosmetic result and need for additional repair Universal protocol:    Imaging studies available: yes     Required blood products, implants, devices, and special equipment available: yes     Patient identity confirmed:  Verbally with patient and arm band Anesthesia:    Anesthesia method:  Local infiltration   Local anesthetic:  Lidocaine 2% WITH epi Laceration details:    Location:  Shoulder/arm   Shoulder/arm location:  R lower arm   Length (cm):  14 Exploration:    Hemostasis achieved with:  Tied off vessels   Imaging obtained: x-ray     Imaging outcome: foreign body not noted   Treatment:    Area cleansed with:  Povidone-iodine   Amount of cleaning:  Extensive   Irrigation solution:  Sterile water   Irrigation volume:  500cc   Irrigation method:  Pressure wash   Debridement:  None Skin repair:    Repair method:  Staples   Number of staples:  19 Approximation:    Approximation:  Close Repair type:    Repair type:  Intermediate Post-procedure details:    Dressing: nonadherent gauze with coband.   Procedure completion:  Tolerated well, no immediate complications   Medications Ordered in ED Medications  lidocaine-EPINEPHrine (XYLOCAINE W/EPI) 2 %-1:200000 (PF) injection 20 mL (20 mLs Infiltration Given 09/10/20 0257)  Tdap  (BOOSTRIX) injection 0.5 mL (0.5 mLs Intramuscular Given 09/10/20 0257)    ED Course  I have reviewed the triage vital signs and the nursing notes.  Pertinent labs & imaging results that were available during my care of the patient were reviewed by me and considered in my medical decision making (see chart for details).  Clinical Course as of 09/11/20 0548  Mon Sep 10, 2020  0409 Dressing recheck is C/D/I [KH]    Clinical Course User Index [KH] Antonietta Breach, PA-C   MDM Rules/Calculators/A&P                          Tdap booster given. Pressure irrigation performed. Laceration occurred < 8 hours prior to repair which was well tolerated. Discussed suture home care with pt and answered questions. Patientt to follow up for wound check and suture removal in 10-14 days. Patient is hemodynamically stable wtih no complaints prior to discharge.     Final Clinical Impression(s) / ED Diagnoses Final diagnoses:  Forearm laceration, right, initial encounter    Rx / DC Orders ED Discharge Orders     None        Antonietta Breach, PA-C 09/11/20 0549    Ripley Fraise, MD 09/11/20 [phone]

## 2020-09-25 DIAGNOSIS — Z9181 History of falling: Secondary | ICD-10-CM | POA: Diagnosis not present

## 2020-09-25 DIAGNOSIS — S51811D Laceration without foreign body of right forearm, subsequent encounter: Secondary | ICD-10-CM | POA: Diagnosis not present

## 2020-10-01 DIAGNOSIS — G4700 Insomnia, unspecified: Secondary | ICD-10-CM | POA: Diagnosis not present

## 2020-10-01 DIAGNOSIS — R634 Abnormal weight loss: Secondary | ICD-10-CM | POA: Diagnosis not present

## 2020-10-01 DIAGNOSIS — R29898 Other symptoms and signs involving the musculoskeletal system: Secondary | ICD-10-CM | POA: Diagnosis not present

## 2020-10-01 DIAGNOSIS — I10 Essential (primary) hypertension: Secondary | ICD-10-CM | POA: Diagnosis not present

## 2020-10-01 DIAGNOSIS — R2689 Other abnormalities of gait and mobility: Secondary | ICD-10-CM | POA: Diagnosis not present

## 2020-11-06 DIAGNOSIS — J349 Unspecified disorder of nose and nasal sinuses: Secondary | ICD-10-CM | POA: Diagnosis not present

## 2020-11-06 DIAGNOSIS — Z1159 Encounter for screening for other viral diseases: Secondary | ICD-10-CM | POA: Diagnosis not present

## 2020-11-06 DIAGNOSIS — R634 Abnormal weight loss: Secondary | ICD-10-CM | POA: Diagnosis not present

## 2020-11-06 DIAGNOSIS — R739 Hyperglycemia, unspecified: Secondary | ICD-10-CM | POA: Diagnosis not present

## 2020-11-06 DIAGNOSIS — Z86718 Personal history of other venous thrombosis and embolism: Secondary | ICD-10-CM | POA: Diagnosis not present

## 2020-11-06 DIAGNOSIS — D649 Anemia, unspecified: Secondary | ICD-10-CM | POA: Diagnosis not present

## 2020-11-14 DIAGNOSIS — D649 Anemia, unspecified: Secondary | ICD-10-CM | POA: Diagnosis not present

## 2020-11-15 ENCOUNTER — Other Ambulatory Visit: Payer: Self-pay | Admitting: Physician Assistant

## 2020-11-15 DIAGNOSIS — R059 Cough, unspecified: Secondary | ICD-10-CM

## 2020-11-15 DIAGNOSIS — K5900 Constipation, unspecified: Secondary | ICD-10-CM

## 2020-11-15 DIAGNOSIS — R1310 Dysphagia, unspecified: Secondary | ICD-10-CM | POA: Diagnosis not present

## 2020-11-15 DIAGNOSIS — R634 Abnormal weight loss: Secondary | ICD-10-CM

## 2020-11-15 DIAGNOSIS — D500 Iron deficiency anemia secondary to blood loss (chronic): Secondary | ICD-10-CM | POA: Diagnosis not present

## 2020-11-28 DIAGNOSIS — G4733 Obstructive sleep apnea (adult) (pediatric): Secondary | ICD-10-CM | POA: Diagnosis not present

## 2020-11-28 DIAGNOSIS — M25512 Pain in left shoulder: Secondary | ICD-10-CM | POA: Diagnosis not present

## 2020-11-28 DIAGNOSIS — G8929 Other chronic pain: Secondary | ICD-10-CM | POA: Diagnosis not present

## 2020-11-28 DIAGNOSIS — R9389 Abnormal findings on diagnostic imaging of other specified body structures: Secondary | ICD-10-CM | POA: Diagnosis not present

## 2020-11-28 DIAGNOSIS — R634 Abnormal weight loss: Secondary | ICD-10-CM | POA: Diagnosis not present

## 2020-11-28 DIAGNOSIS — R350 Frequency of micturition: Secondary | ICD-10-CM | POA: Diagnosis not present

## 2020-11-30 ENCOUNTER — Other Ambulatory Visit: Payer: Self-pay

## 2020-11-30 ENCOUNTER — Ambulatory Visit: Payer: Medicare Other | Admitting: Family Medicine

## 2020-11-30 ENCOUNTER — Ambulatory Visit (INDEPENDENT_AMBULATORY_CARE_PROVIDER_SITE_OTHER): Payer: Medicare Other

## 2020-11-30 ENCOUNTER — Ambulatory Visit: Payer: Self-pay

## 2020-11-30 VITALS — BP 138/82 | HR 65 | Ht 70.5 in | Wt 182.2 lb

## 2020-11-30 DIAGNOSIS — M25512 Pain in left shoulder: Secondary | ICD-10-CM

## 2020-11-30 DIAGNOSIS — G8929 Other chronic pain: Secondary | ICD-10-CM

## 2020-11-30 DIAGNOSIS — R296 Repeated falls: Secondary | ICD-10-CM

## 2020-11-30 DIAGNOSIS — R2689 Other abnormalities of gait and mobility: Secondary | ICD-10-CM

## 2020-11-30 NOTE — Patient Instructions (Signed)
Thank you for coming in today.   Please get an Xray today before you leave   I've referred you to Physical Therapy.  Let us know if you don't hear from them in one week.   Recheck in 6 weeks.   Let me know if you have a problem or if this is not working.

## 2020-11-30 NOTE — Progress Notes (Signed)
I, Steven Guerrero, LAT, ATC acting as a scribe for Steven Leader, MD.  Subjective:    CC: L shoulder pain  HPI: Pt is an 80 y/o male c/o L shoulder pain ongoing for year. Pt's wife notes he has a prior dx of complete tear of a muscle of his RC and is barely able to move his shoulder at this point.. Pt locates pain to the superior aspect of shoulder.   Radiates: yes- into L side of neck Aggravates: shoulder flex Treatments tried: Voltaren gel   Pertinent review of Systems: No fevers or chills  Relevant historical information: Hypertension.  History of stroke.   Objective:    Vitals:   11/30/20 1016  BP: 138/82  Pulse: 65  SpO2: 97%   General: Well Developed, well nourished, and in no acute distress.   MSK: Left shoulder normal-appearing Decreased range of motion to abduction to 100 degrees.  Internal rotation lumbar spine external rotation normal.  Strength diminished abduction and external rotation 4/5 normal internal rotation.  Positive impingement testing.  Significant impaired gait with shuffling gait requiring the use of a walker to ambulate.  Lab and Radiology Results  Procedure: Real-time Ultrasound Guided Injection of left shoulder glenohumeral joint posterior approach Device: Philips Affiniti 50G Images permanently stored and available for review in PACS Ultrasound evaluation prior to injection reveals intact subscapularis tendon. Degenerative but intact supraspinatus tendon. Intact appearing infraspinatus tendon AC joint DJD Verbal informed consent obtained.  Discussed risks and benefits of procedure. Warned about infection bleeding damage to structures skin hypopigmentation and fat atrophy among others. Patient expresses understanding and agreement Time-out conducted.   Noted no overlying erythema, induration, or other signs of local infection.   Skin prepped in a sterile fashion.   Local anesthesia: Topical Ethyl chloride.   With sterile technique and  under real time ultrasound guidance: 40 mg of Kenalog and 2 mL of Marcaine injected into shoulder joint. Fluid seen entering the joint capsule.   Completed without difficulty   Pain immediately resolved suggesting accurate placement of the medication.   Advised to call if fevers/chills, erythema, induration, drainage, or persistent bleeding.   Images permanently stored and available for review in the ultrasound unit.  Impression: Technically successful ultrasound guided injection.   X-ray images left shoulder obtained today personally and independently interpreted Mild to moderate glenohumeral DJD.  AC DJD.  No acute fractures. Await formal radiology review    Impression and Recommendations:    Assessment and Plan: 80 y.o. male with left shoulder pain thought to be due to probable rotator cuff tear and glenohumeral DJD.  Patient is not a good surgical candidate.  Plan for home health physical therapy.  Additionally we will proceed with injection today.  Also patient has significant impaired gait and is at risk for falls.  Also refer to home health PT for gait training and fall prevention.  Recheck in 6 weeks.Marland Kitchen  PDMP not reviewed this encounter. Orders Placed This Encounter  Procedures   [place] LIMITED JOINT SPACE STRUCTURES UP LEFT(NO LINKED CHARGES)    Standing Status:   Future    Number of Occurrences:   1    Standing Expiration Date:   05/30/2021    Order Specific Question:   Reason for Exam (SYMPTOM  OR DIAGNOSIS REQUIRED)    Answer:   left shoulder pain    Order Specific Question:   Preferred imaging location?    Answer:   [place]   DG Shoulder Left  Standing Status:   Future    Number of Occurrences:   1    Standing Expiration Date:   11/30/2021    Order Specific Question:   Reason for Exam (SYMPTOM  OR DIAGNOSIS REQUIRED)    Answer:   left shoulder pain    Order Specific Question:   Preferred imaging location?    Answer:   [facility]    Ambulatory referral to [facility]    Referral Priority:   Routine    Referral Type:   [place]    Referral Reason:   Specialty Services Required    Requested Specialty:   [place]    Number of Visits Requested:   1   No orders of the defined types were placed in this encounter.   Discussed warning signs or symptoms. Please see discharge instructions. Patient expresses understanding.   The above documentation has been reviewed and is accurate and complete Steven Guerrero, M.D.

## 2020-12-04 ENCOUNTER — Ambulatory Visit
Admission: RE | Admit: 2020-12-04 | Discharge: 2020-12-04 | Disposition: A | Discharge status: Home / Self Care | Payer: Medicare Other | Source: Ambulatory Visit | Attending: Physician Assistant | Admitting: Physician Assistant

## 2020-12-04 ENCOUNTER — Other Ambulatory Visit: Payer: Self-pay

## 2020-12-04 DIAGNOSIS — R634 Abnormal weight loss: Secondary | ICD-10-CM | POA: Diagnosis not present

## 2020-12-04 DIAGNOSIS — K8020 Calculus of gallbladder without cholecystitis without obstruction: Secondary | ICD-10-CM | POA: Diagnosis not present

## 2020-12-04 DIAGNOSIS — J439 Emphysema, unspecified: Secondary | ICD-10-CM | POA: Diagnosis not present

## 2020-12-04 DIAGNOSIS — K5790 Diverticulosis of intestine, part unspecified, without perforation or abscess without bleeding: Secondary | ICD-10-CM | POA: Diagnosis not present

## 2020-12-04 DIAGNOSIS — K5900 Constipation, unspecified: Secondary | ICD-10-CM

## 2020-12-04 DIAGNOSIS — R059 Cough, unspecified: Secondary | ICD-10-CM

## 2020-12-04 DIAGNOSIS — I2510 Atherosclerotic heart disease of native coronary artery without angina pectoris: Secondary | ICD-10-CM | POA: Diagnosis not present

## 2020-12-04 MED ORDER — IOPAMIDOL (ISOVUE-300) INJECTION 61%
100.0000 mL | Freq: Once | INTRAVENOUS | Status: AC | PRN
  Administered 2020-12-04: 80 mL via INTRAVENOUS

## 2020-12-04 NOTE — Progress Notes (Signed)
Left shoulder x-ray shows mild arthritis of the main shoulder joint.

## 2020-12-06 DIAGNOSIS — H43813 Vitreous degeneration, bilateral: Secondary | ICD-10-CM | POA: Diagnosis not present

## 2020-12-06 DIAGNOSIS — Z961 Presence of intraocular lens: Secondary | ICD-10-CM | POA: Diagnosis not present

## 2020-12-06 DIAGNOSIS — H35362 Drusen (degenerative) of macula, left eye: Secondary | ICD-10-CM | POA: Diagnosis not present

## 2020-12-07 DIAGNOSIS — Z23 Encounter for immunization: Secondary | ICD-10-CM | POA: Diagnosis not present

## 2020-12-22 DIAGNOSIS — Z86711 Personal history of pulmonary embolism: Secondary | ICD-10-CM | POA: Diagnosis not present

## 2020-12-22 DIAGNOSIS — Z8673 Personal history of transient ischemic attack (TIA), and cerebral infarction without residual deficits: Secondary | ICD-10-CM | POA: Diagnosis not present

## 2020-12-22 DIAGNOSIS — M75122 Complete rotator cuff tear or rupture of left shoulder, not specified as traumatic: Secondary | ICD-10-CM | POA: Diagnosis not present

## 2020-12-22 DIAGNOSIS — Z9181 History of falling: Secondary | ICD-10-CM | POA: Diagnosis not present

## 2020-12-22 DIAGNOSIS — Z87820 Personal history of traumatic brain injury: Secondary | ICD-10-CM | POA: Diagnosis not present

## 2020-12-22 DIAGNOSIS — Z7901 Long term (current) use of anticoagulants: Secondary | ICD-10-CM | POA: Diagnosis not present

## 2020-12-22 DIAGNOSIS — Z96641 Presence of right artificial hip joint: Secondary | ICD-10-CM | POA: Diagnosis not present

## 2020-12-22 DIAGNOSIS — F32A Depression, unspecified: Secondary | ICD-10-CM | POA: Diagnosis not present

## 2020-12-22 DIAGNOSIS — M19012 Primary osteoarthritis, left shoulder: Secondary | ICD-10-CM | POA: Diagnosis not present

## 2020-12-22 DIAGNOSIS — I129 Hypertensive chronic kidney disease with stage 1 through stage 4 chronic kidney disease, or unspecified chronic kidney disease: Secondary | ICD-10-CM | POA: Diagnosis not present

## 2020-12-22 DIAGNOSIS — J449 Chronic obstructive pulmonary disease, unspecified: Secondary | ICD-10-CM | POA: Diagnosis not present

## 2020-12-22 DIAGNOSIS — N189 Chronic kidney disease, unspecified: Secondary | ICD-10-CM | POA: Diagnosis not present

## 2020-12-22 DIAGNOSIS — E785 Hyperlipidemia, unspecified: Secondary | ICD-10-CM | POA: Diagnosis not present

## 2020-12-22 DIAGNOSIS — G8929 Other chronic pain: Secondary | ICD-10-CM | POA: Diagnosis not present

## 2020-12-22 DIAGNOSIS — K219 Gastro-esophageal reflux disease without esophagitis: Secondary | ICD-10-CM | POA: Diagnosis not present

## 2020-12-22 DIAGNOSIS — Z79899 Other long term (current) drug therapy: Secondary | ICD-10-CM | POA: Diagnosis not present

## 2020-12-24 ENCOUNTER — Encounter: Payer: Self-pay | Admitting: Pulmonary Disease

## 2020-12-24 ENCOUNTER — Other Ambulatory Visit: Payer: Self-pay

## 2020-12-24 ENCOUNTER — Ambulatory Visit: Payer: Medicare Other | Admitting: Pulmonary Disease

## 2020-12-24 VITALS — BP 112/60 | HR 80 | Ht 68.0 in | Wt 181.0 lb

## 2020-12-24 DIAGNOSIS — R918 Other nonspecific abnormal finding of lung field: Secondary | ICD-10-CM | POA: Diagnosis not present

## 2020-12-24 DIAGNOSIS — J42 Unspecified chronic bronchitis: Secondary | ICD-10-CM | POA: Diagnosis not present

## 2020-12-24 DIAGNOSIS — J449 Chronic obstructive pulmonary disease, unspecified: Secondary | ICD-10-CM

## 2020-12-24 DIAGNOSIS — J439 Emphysema, unspecified: Secondary | ICD-10-CM | POA: Insufficient documentation

## 2020-12-24 MED ORDER — IPRATROPIUM-ALBUTEROL 0.5-2.5 (3) MG/3ML IN SOLN: 3.0000 mL | Freq: Four times a day (QID) | RESPIRATORY_TRACT | 11 refills | Status: DC

## 2020-12-24 MED ORDER — IPRATROPIUM-ALBUTEROL 0.5-2.5 (3) MG/3ML IN SOLN: 3.0000 mL | RESPIRATORY_TRACT | 11 refills | Status: DC

## 2020-12-24 NOTE — Progress Notes (Signed)
Subjective:   [name] GENDER: male DOB: [birth year], MRN: [id number]   HPI  Chief Complaint  Patient presents with   Consult    SOB while eating and during exertion, has a productive cough throughout the day for about 6 months.    Reason for Visit: New consult for shortness, productive cough   [name] is a 80 year old male former smoker with hx CVA in 2001, hx TBI in 2003, hx post-op PE on xarelto, OSA not on CPAP who presents as a new consult. Wife is present and provides history as note below.  He began having gradually worsening shortness of breath one year ago. Has productive cough with clear sputum. Mild wheezing nightly. Wakes up intermittently in the night with gasping. Never used an inhaler before. Worsens with exertion. Only able to walk 50 ft but requires effort.  He was diagnosed with OSA >10 years ago. Does not like to wear CPAP  Social History: Quit in 2008. Smoked 1ppd x 40 years  Environmental exposures: Asbestosis during insulation for 5-6 years.  I have personally reviewed patient's past medical/family/social history, allergies, current medications.  Past Medical History:  Diagnosis Date   Anxiety and depression    Back pain    Brain bleed ([place])    2003   CVA (cerebral infarction)    2001   Hypercholesteremia    Hypertension    Peripheral neuropathy    Pulmonary embolism ([facility])    Sleep apnea    Stroke ([place])    2001   TBI (traumatic brain injury)      Family History  Problem Relation Age of Onset   Heart attack Father        Mother - health hx unknown     Social History   Occupational History   Occupation: Retired  Tobacco Use   Smoking status: Former    Packs/day: 1.50    Years: 47.00    Pack years: 70.50    Types: Cigarettes    Quit date: 03/24/2006    Years since quitting: 14.7   Smokeless tobacco: Never  Vaping Use   Vaping Use: Never used  Substance and Sexual Activity   Alcohol use: Yes     Alcohol/week: 0.0 standard drinks    Comment: 1 beer per day   Drug use: No   Sexual activity: Not on file    Allergies  Allergen Reactions   Atenolol Other (See Comments)    Does not remember reaction   Lotensin [Benazepril Hcl] Other (See Comments)    Does not remember reaction   Wellbutrin [Bupropion] Other (See Comments)    Does not remember reaction     Outpatient Medications Prior to Visit  Medication Sig Dispense Refill   amLODipine (NORVASC) 10 MG tablet Take 10 mg by mouth daily.     atorvastatin (LIPITOR) 20 MG tablet Take 20 mg by mouth daily.      citalopram (CELEXA) 40 MG tablet Take 40 mg by mouth at bedtime.      Evolocumab (REPATHA) 140 MG/ML SOSY Inject 140 mg into the skin every 14 (fourteen) days. Tuesday     Ferrous Gluconate-C-Folic Acid (IRON-C PO) Take 1 tablet by mouth at bedtime.     fish oil-omega-3 fatty acids 1000 MG capsule Take 1 g by mouth daily.     losartan (COZAAR) 100 MG tablet Take 100 mg by mouth daily.     mirtazapine (REMERON) 15 MG tablet Take 15  mg by mouth daily.     Multiple Vitamin (MULTIVITAMIN WITH MINERALS) TABS tablet Take 1 tablet by mouth daily.     OVER THE COUNTER MEDICATION Take 1 tablet by mouth daily. Vitamin for vision **areds**     pantoprazole (PROTONIX) 40 MG tablet Take 40 mg by mouth daily.      rivaroxaban (XARELTO) 20 MG TABS tablet Take 1 tablet (20 mg total) by mouth daily with supper. 30 tablet 0   vitamin B-12 (CYANOCOBALAMIN) 1000 MCG tablet Take 1,000 mcg by mouth daily.     baclofen (LIORESAL) 10 MG tablet TAKE 1 TABLET BY MOUTH AT  BEDTIME (Patient not taking: Reported on 12/24/2020) 90 tablet 1   gabapentin (NEURONTIN) 100 MG capsule Take 100-200 mg by mouth See admin instructions. Taking 100mg  in the AM and 2 capsules (200mg ) at 1200, then 200mg  at night. (Patient not taking: Reported on 12/24/2020)  1   traZODone (DESYREL) 50 MG tablet Take 50 mg by mouth at bedtime. (Patient not taking: Reported on 12/24/2020)      No facility-administered medications prior to visit.    Review of Systems  Constitutional:  Positive for malaise/fatigue. Negative for chills, diaphoresis, fever and weight loss.  HENT:  Negative for congestion, ear pain and sore throat.   Respiratory:  Positive for cough, sputum production, shortness of breath and wheezing. Negative for hemoptysis.   Cardiovascular:  Negative for chest pain, palpitations and leg swelling.  Gastrointestinal:  Negative for abdominal pain, heartburn and nausea.  Genitourinary:  Negative for frequency.  Musculoskeletal:  Negative for joint pain and myalgias.  Skin:  Negative for itching and rash.  Neurological:  Negative for dizziness, weakness and headaches.  Endo/Heme/Allergies:  Does not bruise/bleed easily.  Psychiatric/Behavioral:  Negative for depression. The patient is not nervous/anxious.     Objective:   Vitals:   12/24/20 1340  BP: 112/60  Pulse: 80  SpO2: 97%  Weight: 181 lb (82.1 kg)  Height: 5\' 8"  (1.727 m)   SpO2: 97 % O2 Device: None (Room air)  Physical Exam: General: Well-appearing, no acute distress HENT: [place], AT Eyes: EOMI, no scleral icterus Respiratory: Clear to auscultation bilaterally.  No crackles, wheezing or rales Cardiovascular: RRR, -M/R/G, no JVD Extremities:-Edema,-tenderness Neuro: AAO x4, right sided facial weakness  Psych: Normal mood, normal affect  Data Reviewed:  Imaging: CT Chest Lung Cancer Screen 04/03/17 - Bilateral lower lobe subcentimeter nodules CT CAP (Chest reviewed) 12/04/20 - Bilateral calcifed pleural plaques. Apical predominant emphysema. Lower lobe bronchial thicekning. RLL lung nodules <67mm  PFT: None on file  Labs: CBC    Component Value Date/Time   WBC 6.4 09/09/2019 0758   RBC 4.46 09/09/2019 0758   HGB 14.3 09/09/2019 1059   HCT 42.0 09/09/2019 1059   PLT 225 09/09/2019 0758   MCV 96.0 09/09/2019 0758   MCH 31.2 09/09/2019 0758   MCHC 32.5 09/09/2019 0758   RDW 13.9  09/09/2019 0758   LYMPHSABS 1.2 09/09/2019 0758   MONOABS 0.6 09/09/2019 0758   EOSABS 0.2 09/09/2019 0758   BASOSABS 0.1 09/09/2019 0758   Absolute eos 09/09/19 - 200    Assessment & Plan:   Discussion: 80 year old male former smoker with hx CVA in 2001, hx TBI in 2003, hx post-op PE on xarelto, OSA not on CPAP who presents with chronic bronchitis manifested was gradually worsening shortness of breath in the last year. Discussed clinical course and management of COPD including bronchodilator regimen and action plan for exacerbation. Due  to his orofacial weakness, will defer PFT management and opt for nebulizers for his emphysema treatment.  Emphysema Chronic bronchitis --START DUONEBs THREE times a day and AS NEEDED for shortness of breath or wheezing  COPD Action Plan Increase DUONEBS to [place] for worsening shortness of breath, wheezing and cough. If you symptoms do not improve in 24-48 hours, please our office for evaluation and/or steroid taper.  Subcentimeter lung nodules, benign --Reviewed CT 11/2020 and compared to 03/2017. Unchanged/stable nodule size >2 years. --No indication for further imaging  Health Maintenance Immunization History  Administered Date(s) Administered   Influenza, Quadrivalent, Recombinant, Inj, Pf 12/30/2016, 11/27/2017, 11/26/2018, 01/03/2020, 12/07/2020   Influenza-Unspecified 12/22/2017   Tdap 10/26/2014, 09/10/2020   CT Lung Screen - not qualified. >80 years age  No orders of the defined types were placed in this encounter. Meds ordered this encounter  Medications   DISCONTD: ipratropium-albuterol (DUONEB) 0.5-2.5 (3) MG/3ML SOLN    Sig: Take 3 mLs by nebulization every 4 (four) hours.    Dispense:  360 mL    Refill:  11    Dx: J44.9   ipratropium-albuterol (DUONEB) 0.5-2.5 (3) MG/3ML SOLN    Sig: Take 3 mLs by nebulization every 6 (six) hours.    Dispense:  360 mL    Refill:  11    Dx: J44.9    Return in about 4 months (around  04/26/2021).  I have spent a total time of 46-minutes on the day of the appointment reviewing prior documentation, coordinating care and discussing medical diagnosis and plan with the patient/family. Imaging, labs and tests included in this note have been reviewed and interpreted independently by me.  [place], MD [facility] 12/24/2020 1:44 PM  Office Number [phone]

## 2020-12-24 NOTE — Patient Instructions (Signed)
Emphysema Chronic bronchitis --START DUONEBs THREE times a day  Asthma Action Plan Increase DUONEBS to EVERY FOUR HOURS for worsening shortness of breath, wheezing and cough. If you symptoms do not improve in 24-48 hours, please our office for evaluation and/or steroid taper.  Subcentimeter lung nodules --No indication for further imaging  Follow-up with me 4 months

## 2020-12-28 DIAGNOSIS — N189 Chronic kidney disease, unspecified: Secondary | ICD-10-CM | POA: Diagnosis not present

## 2020-12-28 DIAGNOSIS — Z7901 Long term (current) use of anticoagulants: Secondary | ICD-10-CM | POA: Diagnosis not present

## 2020-12-28 DIAGNOSIS — Z96641 Presence of right artificial hip joint: Secondary | ICD-10-CM | POA: Diagnosis not present

## 2020-12-28 DIAGNOSIS — F32A Depression, unspecified: Secondary | ICD-10-CM | POA: Diagnosis not present

## 2020-12-28 DIAGNOSIS — Z9181 History of falling: Secondary | ICD-10-CM | POA: Diagnosis not present

## 2020-12-28 DIAGNOSIS — G8929 Other chronic pain: Secondary | ICD-10-CM | POA: Diagnosis not present

## 2020-12-28 DIAGNOSIS — M19012 Primary osteoarthritis, left shoulder: Secondary | ICD-10-CM | POA: Diagnosis not present

## 2020-12-28 DIAGNOSIS — Z8673 Personal history of transient ischemic attack (TIA), and cerebral infarction without residual deficits: Secondary | ICD-10-CM | POA: Diagnosis not present

## 2020-12-28 DIAGNOSIS — E785 Hyperlipidemia, unspecified: Secondary | ICD-10-CM | POA: Diagnosis not present

## 2020-12-28 DIAGNOSIS — K219 Gastro-esophageal reflux disease without esophagitis: Secondary | ICD-10-CM | POA: Diagnosis not present

## 2020-12-28 DIAGNOSIS — M75122 Complete rotator cuff tear or rupture of left shoulder, not specified as traumatic: Secondary | ICD-10-CM | POA: Diagnosis not present

## 2020-12-28 DIAGNOSIS — Z86711 Personal history of pulmonary embolism: Secondary | ICD-10-CM | POA: Diagnosis not present

## 2020-12-28 DIAGNOSIS — Z87820 Personal history of traumatic brain injury: Secondary | ICD-10-CM | POA: Diagnosis not present

## 2020-12-28 DIAGNOSIS — I129 Hypertensive chronic kidney disease with stage 1 through stage 4 chronic kidney disease, or unspecified chronic kidney disease: Secondary | ICD-10-CM | POA: Diagnosis not present

## 2020-12-28 DIAGNOSIS — Z79899 Other long term (current) drug therapy: Secondary | ICD-10-CM | POA: Diagnosis not present

## 2020-12-28 DIAGNOSIS — J449 Chronic obstructive pulmonary disease, unspecified: Secondary | ICD-10-CM | POA: Diagnosis not present

## 2020-12-31 DIAGNOSIS — Z87820 Personal history of traumatic brain injury: Secondary | ICD-10-CM | POA: Diagnosis not present

## 2020-12-31 DIAGNOSIS — E785 Hyperlipidemia, unspecified: Secondary | ICD-10-CM | POA: Diagnosis not present

## 2020-12-31 DIAGNOSIS — F32A Depression, unspecified: Secondary | ICD-10-CM | POA: Diagnosis not present

## 2020-12-31 DIAGNOSIS — Z86711 Personal history of pulmonary embolism: Secondary | ICD-10-CM | POA: Diagnosis not present

## 2020-12-31 DIAGNOSIS — Z8673 Personal history of transient ischemic attack (TIA), and cerebral infarction without residual deficits: Secondary | ICD-10-CM | POA: Diagnosis not present

## 2020-12-31 DIAGNOSIS — N189 Chronic kidney disease, unspecified: Secondary | ICD-10-CM | POA: Diagnosis not present

## 2020-12-31 DIAGNOSIS — Z79899 Other long term (current) drug therapy: Secondary | ICD-10-CM | POA: Diagnosis not present

## 2020-12-31 DIAGNOSIS — I129 Hypertensive chronic kidney disease with stage 1 through stage 4 chronic kidney disease, or unspecified chronic kidney disease: Secondary | ICD-10-CM | POA: Diagnosis not present

## 2020-12-31 DIAGNOSIS — Z96641 Presence of right artificial hip joint: Secondary | ICD-10-CM | POA: Diagnosis not present

## 2020-12-31 DIAGNOSIS — M19012 Primary osteoarthritis, left shoulder: Secondary | ICD-10-CM | POA: Diagnosis not present

## 2020-12-31 DIAGNOSIS — K219 Gastro-esophageal reflux disease without esophagitis: Secondary | ICD-10-CM | POA: Diagnosis not present

## 2020-12-31 DIAGNOSIS — J449 Chronic obstructive pulmonary disease, unspecified: Secondary | ICD-10-CM | POA: Diagnosis not present

## 2020-12-31 DIAGNOSIS — Z9181 History of falling: Secondary | ICD-10-CM | POA: Diagnosis not present

## 2020-12-31 DIAGNOSIS — M75122 Complete rotator cuff tear or rupture of left shoulder, not specified as traumatic: Secondary | ICD-10-CM | POA: Diagnosis not present

## 2020-12-31 DIAGNOSIS — G8929 Other chronic pain: Secondary | ICD-10-CM | POA: Diagnosis not present

## 2020-12-31 DIAGNOSIS — Z7901 Long term (current) use of anticoagulants: Secondary | ICD-10-CM | POA: Diagnosis not present

## 2021-01-04 DIAGNOSIS — M19012 Primary osteoarthritis, left shoulder: Secondary | ICD-10-CM | POA: Diagnosis not present

## 2021-01-04 DIAGNOSIS — E785 Hyperlipidemia, unspecified: Secondary | ICD-10-CM | POA: Diagnosis not present

## 2021-01-04 DIAGNOSIS — Z8673 Personal history of transient ischemic attack (TIA), and cerebral infarction without residual deficits: Secondary | ICD-10-CM | POA: Diagnosis not present

## 2021-01-04 DIAGNOSIS — Z9181 History of falling: Secondary | ICD-10-CM | POA: Diagnosis not present

## 2021-01-04 DIAGNOSIS — Z7901 Long term (current) use of anticoagulants: Secondary | ICD-10-CM | POA: Diagnosis not present

## 2021-01-04 DIAGNOSIS — M75122 Complete rotator cuff tear or rupture of left shoulder, not specified as traumatic: Secondary | ICD-10-CM | POA: Diagnosis not present

## 2021-01-04 DIAGNOSIS — N189 Chronic kidney disease, unspecified: Secondary | ICD-10-CM | POA: Diagnosis not present

## 2021-01-04 DIAGNOSIS — Z87820 Personal history of traumatic brain injury: Secondary | ICD-10-CM | POA: Diagnosis not present

## 2021-01-04 DIAGNOSIS — K219 Gastro-esophageal reflux disease without esophagitis: Secondary | ICD-10-CM | POA: Diagnosis not present

## 2021-01-04 DIAGNOSIS — Z79899 Other long term (current) drug therapy: Secondary | ICD-10-CM | POA: Diagnosis not present

## 2021-01-04 DIAGNOSIS — F32A Depression, unspecified: Secondary | ICD-10-CM | POA: Diagnosis not present

## 2021-01-04 DIAGNOSIS — G8929 Other chronic pain: Secondary | ICD-10-CM | POA: Diagnosis not present

## 2021-01-04 DIAGNOSIS — Z86711 Personal history of pulmonary embolism: Secondary | ICD-10-CM | POA: Diagnosis not present

## 2021-01-04 DIAGNOSIS — Z96641 Presence of right artificial hip joint: Secondary | ICD-10-CM | POA: Diagnosis not present

## 2021-01-04 DIAGNOSIS — I129 Hypertensive chronic kidney disease with stage 1 through stage 4 chronic kidney disease, or unspecified chronic kidney disease: Secondary | ICD-10-CM | POA: Diagnosis not present

## 2021-01-04 DIAGNOSIS — J449 Chronic obstructive pulmonary disease, unspecified: Secondary | ICD-10-CM | POA: Diagnosis not present

## 2021-01-07 DIAGNOSIS — Z9181 History of falling: Secondary | ICD-10-CM | POA: Diagnosis not present

## 2021-01-07 DIAGNOSIS — Z79899 Other long term (current) drug therapy: Secondary | ICD-10-CM | POA: Diagnosis not present

## 2021-01-07 DIAGNOSIS — M19012 Primary osteoarthritis, left shoulder: Secondary | ICD-10-CM | POA: Diagnosis not present

## 2021-01-07 DIAGNOSIS — I129 Hypertensive chronic kidney disease with stage 1 through stage 4 chronic kidney disease, or unspecified chronic kidney disease: Secondary | ICD-10-CM | POA: Diagnosis not present

## 2021-01-07 DIAGNOSIS — G8929 Other chronic pain: Secondary | ICD-10-CM | POA: Diagnosis not present

## 2021-01-07 DIAGNOSIS — E785 Hyperlipidemia, unspecified: Secondary | ICD-10-CM | POA: Diagnosis not present

## 2021-01-07 DIAGNOSIS — Z7901 Long term (current) use of anticoagulants: Secondary | ICD-10-CM | POA: Diagnosis not present

## 2021-01-07 DIAGNOSIS — Z86711 Personal history of pulmonary embolism: Secondary | ICD-10-CM | POA: Diagnosis not present

## 2021-01-07 DIAGNOSIS — F32A Depression, unspecified: Secondary | ICD-10-CM | POA: Diagnosis not present

## 2021-01-07 DIAGNOSIS — Z87820 Personal history of traumatic brain injury: Secondary | ICD-10-CM | POA: Diagnosis not present

## 2021-01-07 DIAGNOSIS — Z8673 Personal history of transient ischemic attack (TIA), and cerebral infarction without residual deficits: Secondary | ICD-10-CM | POA: Diagnosis not present

## 2021-01-07 DIAGNOSIS — N189 Chronic kidney disease, unspecified: Secondary | ICD-10-CM | POA: Diagnosis not present

## 2021-01-07 DIAGNOSIS — M75122 Complete rotator cuff tear or rupture of left shoulder, not specified as traumatic: Secondary | ICD-10-CM | POA: Diagnosis not present

## 2021-01-07 DIAGNOSIS — J449 Chronic obstructive pulmonary disease, unspecified: Secondary | ICD-10-CM | POA: Diagnosis not present

## 2021-01-07 DIAGNOSIS — Z96641 Presence of right artificial hip joint: Secondary | ICD-10-CM | POA: Diagnosis not present

## 2021-01-07 DIAGNOSIS — K219 Gastro-esophageal reflux disease without esophagitis: Secondary | ICD-10-CM | POA: Diagnosis not present

## 2021-01-09 DIAGNOSIS — F32A Depression, unspecified: Secondary | ICD-10-CM | POA: Diagnosis not present

## 2021-01-09 DIAGNOSIS — J449 Chronic obstructive pulmonary disease, unspecified: Secondary | ICD-10-CM | POA: Diagnosis not present

## 2021-01-09 DIAGNOSIS — I129 Hypertensive chronic kidney disease with stage 1 through stage 4 chronic kidney disease, or unspecified chronic kidney disease: Secondary | ICD-10-CM | POA: Diagnosis not present

## 2021-01-09 DIAGNOSIS — K219 Gastro-esophageal reflux disease without esophagitis: Secondary | ICD-10-CM | POA: Diagnosis not present

## 2021-01-09 DIAGNOSIS — Z9181 History of falling: Secondary | ICD-10-CM | POA: Diagnosis not present

## 2021-01-09 DIAGNOSIS — M19012 Primary osteoarthritis, left shoulder: Secondary | ICD-10-CM | POA: Diagnosis not present

## 2021-01-09 DIAGNOSIS — Z87820 Personal history of traumatic brain injury: Secondary | ICD-10-CM | POA: Diagnosis not present

## 2021-01-09 DIAGNOSIS — Z7901 Long term (current) use of anticoagulants: Secondary | ICD-10-CM | POA: Diagnosis not present

## 2021-01-09 DIAGNOSIS — N189 Chronic kidney disease, unspecified: Secondary | ICD-10-CM | POA: Diagnosis not present

## 2021-01-09 DIAGNOSIS — Z79899 Other long term (current) drug therapy: Secondary | ICD-10-CM | POA: Diagnosis not present

## 2021-01-09 DIAGNOSIS — Z86711 Personal history of pulmonary embolism: Secondary | ICD-10-CM | POA: Diagnosis not present

## 2021-01-09 DIAGNOSIS — G8929 Other chronic pain: Secondary | ICD-10-CM | POA: Diagnosis not present

## 2021-01-09 DIAGNOSIS — Z96641 Presence of right artificial hip joint: Secondary | ICD-10-CM | POA: Diagnosis not present

## 2021-01-09 DIAGNOSIS — M75122 Complete rotator cuff tear or rupture of left shoulder, not specified as traumatic: Secondary | ICD-10-CM | POA: Diagnosis not present

## 2021-01-09 DIAGNOSIS — E785 Hyperlipidemia, unspecified: Secondary | ICD-10-CM | POA: Diagnosis not present

## 2021-01-09 DIAGNOSIS — Z8673 Personal history of transient ischemic attack (TIA), and cerebral infarction without residual deficits: Secondary | ICD-10-CM | POA: Diagnosis not present

## 2021-01-10 NOTE — Progress Notes (Signed)
I, Wendy Poet, LAT, ATC, am serving as scribe for Dr. [name].  Steven Guerrero is a 80 y.o. male who presents to [place] at [facility] today for f/u of chronic L shoulder pain and prior RC tear and for gait instability.  He was last seen by Dr. Georgina Snell on 11/30/20 and had a L GHJ steroid injection and was referred to home health PT for his gait instability.  Today, pt reports that his L shoulder is feeling better since getting the injection at his last visit.  He has been seen by home health PT, OT and speech x 2 visits for PT and speech and once by OT.    [name] and his wife mentioned that both the registered nurse and speech therapy for home health are very concerned about his risk for dysphagia.  They have requested a modified barium swallow study and ask if I can order that today.  He mentions that he does choke and cough after eating food and has a wet voice quality after he eats or drinks.  He does have a history of recurrent pneumonias.  He currently is eating soft or pured food but not thickened liquid.  Diagnostic testing: L shoulder XR- 11/30/20  Pertinent review of systems: No fevers or chills  Relevant historical information: History of stroke.  COPD.   Exam:  BP 124/70 (BP Location: Right Arm, Patient Position: Sitting, Cuff Size: Normal)   Pulse 66   Ht 5\' 8"  (1.727 m)   Wt 183 lb 12.8 oz (83.4 kg)   SpO2 95%   BMI 27.95 kg/m  General: Well Developed, well nourished, and in no acute distress.   MSK: Left shoulder improved range of motion and strength. Gait shuffling requiring the use of a walker to ambulate. Speech poor articulation      Assessment and Plan: 79 y.o. male with shoulder pain.  Significantly improved with injection during the last visit and limited OT and PT.  Plan to continue home exercise program and recheck back as needed. Chronic problem improved  Gait: Significant fall risk improving with physical therapy and with home  health.  Continue the use of a walker and home health PT.  Recheck back as needed. Chronic problem stable  Concern for dysphagia.  Agree with home health PT and nursing recommendation for modified barium swallow study.  Ideally this should be done by his PCP however I do not want to delay his care and will order this test myself and make sure that his primary care provider Dr. Shelia Media gets a copy of the result.  [name] and his wife are happy with this plan. Chronic problem worse with uncertain prognosis   Recheck with me as needed.   PDMP not reviewed this encounter. Orders Placed This Encounter  Procedures   SLP modified barium swallow    Standing Status:   Future    Standing Expiration Date:   01/11/2022    Order Specific Question:   Where should this test be performed:    Answer:   [facility]    Order Specific Question:   Please indicate reason for Referral:    Answer:   Concerned about Dysphagia/Aspiration    Order Specific Question:   Patients current diet consistency:    Answer:   Puree (Dys1)    Order Specific Question:   Patients current liquid consistency:    Answer:   Thin (All Liquid Allowed)    Order Specific Question:   Existing  signs/symptoms of possible Aspiration/Dysphagia:    Answer:   Cough with Meals/Meds/Other P.O.s    Order Specific Question:   Existing signs/symptoms of possible Aspiration/Dysphagia:    Answer:   Wet vocal quality    Order Specific Question:   Existing signs/symptoms of possible Aspiration/Dysphagia:    Answer:   Sensation of food sticking in throat    Order Specific Question:   Other risk factors for Dysphagia:    Answer:   Recurrent history of Pneumonia    Order Specific Question:   Other risk factors for Dysphagia:    Answer:   Dysarthria/poor oral motor skills   No orders of the defined types were placed in this encounter.    Discussed warning signs or symptoms. Please see discharge instructions. Patient expresses understanding.   The  above documentation has been reviewed and is accurate and complete [name], M.D.

## 2021-01-11 ENCOUNTER — Other Ambulatory Visit (HOSPITAL_COMMUNITY): Payer: Self-pay | Admitting: *Deleted

## 2021-01-11 ENCOUNTER — Ambulatory Visit: Payer: Medicare Other | Admitting: Family Medicine

## 2021-01-11 ENCOUNTER — Encounter: Payer: Self-pay | Admitting: Family Medicine

## 2021-01-11 ENCOUNTER — Other Ambulatory Visit: Payer: Self-pay

## 2021-01-11 VITALS — BP 124/70 | HR 66 | Ht 68.0 in | Wt 183.8 lb

## 2021-01-11 DIAGNOSIS — M25512 Pain in left shoulder: Secondary | ICD-10-CM

## 2021-01-11 DIAGNOSIS — G8929 Other chronic pain: Secondary | ICD-10-CM

## 2021-01-11 DIAGNOSIS — R479 Unspecified speech disturbances: Secondary | ICD-10-CM | POA: Diagnosis not present

## 2021-01-11 DIAGNOSIS — R1310 Dysphagia, unspecified: Secondary | ICD-10-CM

## 2021-01-11 DIAGNOSIS — R269 Unspecified abnormalities of gait and mobility: Secondary | ICD-10-CM

## 2021-01-11 NOTE — Patient Instructions (Addendum)
Nice to see you today.  Con't w/ home health PT and Speech therapy at their direction.  Follow-up as needed.  You should hear about the swallow study soon.  I will make sure Dr Shelia Media know what I am doing.   Dysphagia Dysphagia is trouble swallowing. This condition occurs when solids and liquids stick in a person's throat on the way down to the stomach, or when food takes longer to get to the stomach than usual. You may have problems swallowing food, liquids, or both. You may also have pain while trying to swallow. It may take you more time and effort to swallow something. What are the causes? This condition may be caused by: Muscle problems. These may make it difficult for you to move food and liquids through the esophagus, which is the tube that connects your mouth to your stomach. Blockages. You may have ulcers, scar tissue, or inflammation that blocks the normal passage of food and liquids. Causes of these problems include: Acid reflux from your stomach into your esophagus (gastroesophageal reflux). Infections. Radiation treatment for cancer. Medicines taken without enough fluids to wash them down into your stomach. Stroke. This can affect the nerves and make it difficult to swallow. Nerve problems. These prevent signals from being sent to the muscles of your esophagus to squeeze (contract) and move what you swallow down to your stomach. Globus pharyngeus. This is a common problem that involves a feeling like something is stuck in your throat or a sense of trouble with swallowing, even though nothing is wrong with the swallowing passages. Certain conditions, such as cerebral palsy or Parkinson's disease. What are the signs or symptoms? Common symptoms of this condition include: A feeling that solids or liquids are stuck in your throat on the way down to the stomach. Pain while swallowing. Coughing or gagging while trying to swallow. Other symptoms include: Food moving back from your  stomach to your mouth (regurgitation). Noises coming from your throat. Chest discomfort when swallowing. A feeling of fullness when swallowing. Drooling, especially when the throat is blocked. Heartburn. How is this diagnosed? This condition may be diagnosed by: Barium swallow X-ray. In this test, you will swallow a white liquid that sticks to the inside of your esophagus. X-ray images are then taken. Endoscopy. In this test, a flexible telescope is inserted down your throat to look at your esophagus and your stomach. CT scans or an MRI. How is this treated? Treatment for dysphagia depends on the cause of this condition: If the dysphagia is caused by acid reflux or infection, medicines may be used. These may include antibiotics or heartburn medicines. If the dysphagia is caused by problems with the muscles, swallowing therapy may be used to help you strengthen your swallowing muscles. You may have to do specific exercises to strengthen the muscles or stretch them. If the dysphagia is caused by a blockage or mass, procedures to remove the blockage may be done. You may need surgery and a feeding tube. You may need to make diet changes. Ask your health care provider for specific instructions. Follow these instructions at home: Medicines Take over-the-counter and prescription medicines only as told by your health care provider. If you were prescribed an antibiotic medicine, take it as told by your health care provider. Do not stop taking the antibiotic even if you start to feel better. Eating and drinking  Make any diet changes as told by your health care provider. Work with a diet and nutrition specialist (dietitian) to create  an eating plan that will help you get the nutrients you need in order to stay healthy. Eat soft foods that are easier to swallow. Cut your food into small pieces and eat slowly. Take small bites. Eat and drink only when you are sitting upright. Do not drink alcohol or  caffeine. If you need help quitting, ask your health care provider. General instructions Check your weight every day to make sure you are not losing weight. Do not use any products that contain nicotine or tobacco. These products include cigarettes, chewing tobacco, and vaping devices, such as e-cigarettes. If you need help quitting, ask your health care provider. Keep all follow-up visits. This is important. Contact a health care provider if: You lose weight because you cannot swallow. You cough when you drink liquids. You cough up partially digested food. Get help right away if: You cannot swallow your saliva. You have shortness of breath, a fever, or both. Your voice is hoarse and you have trouble swallowing. These symptoms may represent a serious problem that is an emergency. Do not wait to see if the symptoms will go away. Get medical help right away. Call your local emergency services (911 in the [place] Do not drive yourself to the hospital. Summary Dysphagia is trouble swallowing. This condition occurs when solids and liquids stick in a person's throat on the way down to the stomach. You may cough or gag while trying to swallow. Dysphagia has many possible causes. Treatment for dysphagia depends on the cause of the condition. Keep all follow-up visits. This is important. This information is not intended to replace advice given to you by your health care provider. Make sure you discuss any questions you have with your health care provider. Document Revised: 10/29/2019 Document Reviewed: 10/29/2019 Elsevier Patient Education  [place].

## 2021-01-15 DIAGNOSIS — Z8673 Personal history of transient ischemic attack (TIA), and cerebral infarction without residual deficits: Secondary | ICD-10-CM | POA: Diagnosis not present

## 2021-01-15 DIAGNOSIS — M19012 Primary osteoarthritis, left shoulder: Secondary | ICD-10-CM | POA: Diagnosis not present

## 2021-01-15 DIAGNOSIS — J449 Chronic obstructive pulmonary disease, unspecified: Secondary | ICD-10-CM | POA: Diagnosis not present

## 2021-01-15 DIAGNOSIS — Z7901 Long term (current) use of anticoagulants: Secondary | ICD-10-CM | POA: Diagnosis not present

## 2021-01-15 DIAGNOSIS — M75122 Complete rotator cuff tear or rupture of left shoulder, not specified as traumatic: Secondary | ICD-10-CM | POA: Diagnosis not present

## 2021-01-15 DIAGNOSIS — E785 Hyperlipidemia, unspecified: Secondary | ICD-10-CM | POA: Diagnosis not present

## 2021-01-15 DIAGNOSIS — Z79899 Other long term (current) drug therapy: Secondary | ICD-10-CM | POA: Diagnosis not present

## 2021-01-15 DIAGNOSIS — N189 Chronic kidney disease, unspecified: Secondary | ICD-10-CM | POA: Diagnosis not present

## 2021-01-15 DIAGNOSIS — K219 Gastro-esophageal reflux disease without esophagitis: Secondary | ICD-10-CM | POA: Diagnosis not present

## 2021-01-15 DIAGNOSIS — G8929 Other chronic pain: Secondary | ICD-10-CM | POA: Diagnosis not present

## 2021-01-15 DIAGNOSIS — I129 Hypertensive chronic kidney disease with stage 1 through stage 4 chronic kidney disease, or unspecified chronic kidney disease: Secondary | ICD-10-CM | POA: Diagnosis not present

## 2021-01-15 DIAGNOSIS — Z87820 Personal history of traumatic brain injury: Secondary | ICD-10-CM | POA: Diagnosis not present

## 2021-01-15 DIAGNOSIS — Z9181 History of falling: Secondary | ICD-10-CM | POA: Diagnosis not present

## 2021-01-15 DIAGNOSIS — F32A Depression, unspecified: Secondary | ICD-10-CM | POA: Diagnosis not present

## 2021-01-15 DIAGNOSIS — Z96641 Presence of right artificial hip joint: Secondary | ICD-10-CM | POA: Diagnosis not present

## 2021-01-15 DIAGNOSIS — Z86711 Personal history of pulmonary embolism: Secondary | ICD-10-CM | POA: Diagnosis not present

## 2021-01-16 DIAGNOSIS — M19012 Primary osteoarthritis, left shoulder: Secondary | ICD-10-CM | POA: Diagnosis not present

## 2021-01-16 DIAGNOSIS — M75122 Complete rotator cuff tear or rupture of left shoulder, not specified as traumatic: Secondary | ICD-10-CM | POA: Diagnosis not present

## 2021-01-16 DIAGNOSIS — K219 Gastro-esophageal reflux disease without esophagitis: Secondary | ICD-10-CM | POA: Diagnosis not present

## 2021-01-16 DIAGNOSIS — Z8673 Personal history of transient ischemic attack (TIA), and cerebral infarction without residual deficits: Secondary | ICD-10-CM | POA: Diagnosis not present

## 2021-01-16 DIAGNOSIS — Z7901 Long term (current) use of anticoagulants: Secondary | ICD-10-CM | POA: Diagnosis not present

## 2021-01-16 DIAGNOSIS — E785 Hyperlipidemia, unspecified: Secondary | ICD-10-CM | POA: Diagnosis not present

## 2021-01-16 DIAGNOSIS — Z86711 Personal history of pulmonary embolism: Secondary | ICD-10-CM | POA: Diagnosis not present

## 2021-01-16 DIAGNOSIS — F32A Depression, unspecified: Secondary | ICD-10-CM | POA: Diagnosis not present

## 2021-01-16 DIAGNOSIS — Z9181 History of falling: Secondary | ICD-10-CM | POA: Diagnosis not present

## 2021-01-16 DIAGNOSIS — Z96641 Presence of right artificial hip joint: Secondary | ICD-10-CM | POA: Diagnosis not present

## 2021-01-16 DIAGNOSIS — I129 Hypertensive chronic kidney disease with stage 1 through stage 4 chronic kidney disease, or unspecified chronic kidney disease: Secondary | ICD-10-CM | POA: Diagnosis not present

## 2021-01-16 DIAGNOSIS — G8929 Other chronic pain: Secondary | ICD-10-CM | POA: Diagnosis not present

## 2021-01-16 DIAGNOSIS — N189 Chronic kidney disease, unspecified: Secondary | ICD-10-CM | POA: Diagnosis not present

## 2021-01-16 DIAGNOSIS — J449 Chronic obstructive pulmonary disease, unspecified: Secondary | ICD-10-CM | POA: Diagnosis not present

## 2021-01-16 DIAGNOSIS — Z79899 Other long term (current) drug therapy: Secondary | ICD-10-CM | POA: Diagnosis not present

## 2021-01-16 DIAGNOSIS — Z87820 Personal history of traumatic brain injury: Secondary | ICD-10-CM | POA: Diagnosis not present

## 2021-01-17 ENCOUNTER — Ambulatory Visit (HOSPITAL_COMMUNITY)
Admission: RE | Admit: 2021-01-17 | Discharge: 2021-01-17 | Disposition: A | Discharge status: Home / Self Care | Payer: Medicare Other | Source: Ambulatory Visit | Attending: Family Medicine | Admitting: Family Medicine

## 2021-01-17 ENCOUNTER — Other Ambulatory Visit: Payer: Self-pay

## 2021-01-17 DIAGNOSIS — R1310 Dysphagia, unspecified: Secondary | ICD-10-CM | POA: Insufficient documentation

## 2021-01-17 DIAGNOSIS — R479 Unspecified speech disturbances: Secondary | ICD-10-CM | POA: Insufficient documentation

## 2021-01-17 NOTE — Progress Notes (Signed)
Swallow study fortunately did not show severe aspiration and they do not recommend a modified diet.  This is great news.  I will send the swallow study results to Dr. Shelia Media.

## 2021-01-22 DIAGNOSIS — Z7901 Long term (current) use of anticoagulants: Secondary | ICD-10-CM | POA: Diagnosis not present

## 2021-01-22 DIAGNOSIS — M19012 Primary osteoarthritis, left shoulder: Secondary | ICD-10-CM | POA: Diagnosis not present

## 2021-01-22 DIAGNOSIS — Z86711 Personal history of pulmonary embolism: Secondary | ICD-10-CM | POA: Diagnosis not present

## 2021-01-22 DIAGNOSIS — I129 Hypertensive chronic kidney disease with stage 1 through stage 4 chronic kidney disease, or unspecified chronic kidney disease: Secondary | ICD-10-CM | POA: Diagnosis not present

## 2021-01-22 DIAGNOSIS — K219 Gastro-esophageal reflux disease without esophagitis: Secondary | ICD-10-CM | POA: Diagnosis not present

## 2021-01-22 DIAGNOSIS — Z87820 Personal history of traumatic brain injury: Secondary | ICD-10-CM | POA: Diagnosis not present

## 2021-01-22 DIAGNOSIS — J449 Chronic obstructive pulmonary disease, unspecified: Secondary | ICD-10-CM | POA: Diagnosis not present

## 2021-01-22 DIAGNOSIS — Z96641 Presence of right artificial hip joint: Secondary | ICD-10-CM | POA: Diagnosis not present

## 2021-01-22 DIAGNOSIS — G8929 Other chronic pain: Secondary | ICD-10-CM | POA: Diagnosis not present

## 2021-01-22 DIAGNOSIS — Z8673 Personal history of transient ischemic attack (TIA), and cerebral infarction without residual deficits: Secondary | ICD-10-CM | POA: Diagnosis not present

## 2021-01-22 DIAGNOSIS — Z79899 Other long term (current) drug therapy: Secondary | ICD-10-CM | POA: Diagnosis not present

## 2021-01-22 DIAGNOSIS — M75122 Complete rotator cuff tear or rupture of left shoulder, not specified as traumatic: Secondary | ICD-10-CM | POA: Diagnosis not present

## 2021-01-22 DIAGNOSIS — F32A Depression, unspecified: Secondary | ICD-10-CM | POA: Diagnosis not present

## 2021-01-22 DIAGNOSIS — Z9181 History of falling: Secondary | ICD-10-CM | POA: Diagnosis not present

## 2021-01-22 DIAGNOSIS — N189 Chronic kidney disease, unspecified: Secondary | ICD-10-CM | POA: Diagnosis not present

## 2021-01-22 DIAGNOSIS — E785 Hyperlipidemia, unspecified: Secondary | ICD-10-CM | POA: Diagnosis not present

## 2021-01-23 DIAGNOSIS — N189 Chronic kidney disease, unspecified: Secondary | ICD-10-CM | POA: Diagnosis not present

## 2021-01-23 DIAGNOSIS — Z87820 Personal history of traumatic brain injury: Secondary | ICD-10-CM | POA: Diagnosis not present

## 2021-01-23 DIAGNOSIS — Z9181 History of falling: Secondary | ICD-10-CM | POA: Diagnosis not present

## 2021-01-23 DIAGNOSIS — E785 Hyperlipidemia, unspecified: Secondary | ICD-10-CM | POA: Diagnosis not present

## 2021-01-23 DIAGNOSIS — G8929 Other chronic pain: Secondary | ICD-10-CM | POA: Diagnosis not present

## 2021-01-23 DIAGNOSIS — Z86711 Personal history of pulmonary embolism: Secondary | ICD-10-CM | POA: Diagnosis not present

## 2021-01-23 DIAGNOSIS — F32A Depression, unspecified: Secondary | ICD-10-CM | POA: Diagnosis not present

## 2021-01-23 DIAGNOSIS — Z8673 Personal history of transient ischemic attack (TIA), and cerebral infarction without residual deficits: Secondary | ICD-10-CM | POA: Diagnosis not present

## 2021-01-23 DIAGNOSIS — I129 Hypertensive chronic kidney disease with stage 1 through stage 4 chronic kidney disease, or unspecified chronic kidney disease: Secondary | ICD-10-CM | POA: Diagnosis not present

## 2021-01-23 DIAGNOSIS — K219 Gastro-esophageal reflux disease without esophagitis: Secondary | ICD-10-CM | POA: Diagnosis not present

## 2021-01-23 DIAGNOSIS — M19012 Primary osteoarthritis, left shoulder: Secondary | ICD-10-CM | POA: Diagnosis not present

## 2021-01-23 DIAGNOSIS — M75122 Complete rotator cuff tear or rupture of left shoulder, not specified as traumatic: Secondary | ICD-10-CM | POA: Diagnosis not present

## 2021-01-23 DIAGNOSIS — Z79899 Other long term (current) drug therapy: Secondary | ICD-10-CM | POA: Diagnosis not present

## 2021-01-23 DIAGNOSIS — Z7901 Long term (current) use of anticoagulants: Secondary | ICD-10-CM | POA: Diagnosis not present

## 2021-01-23 DIAGNOSIS — J449 Chronic obstructive pulmonary disease, unspecified: Secondary | ICD-10-CM | POA: Diagnosis not present

## 2021-01-23 DIAGNOSIS — Z96641 Presence of right artificial hip joint: Secondary | ICD-10-CM | POA: Diagnosis not present

## 2021-01-24 DIAGNOSIS — W19XXXA Unspecified fall, initial encounter: Secondary | ICD-10-CM | POA: Diagnosis not present

## 2021-01-24 DIAGNOSIS — M25512 Pain in left shoulder: Secondary | ICD-10-CM | POA: Diagnosis not present

## 2021-01-28 DIAGNOSIS — G8929 Other chronic pain: Secondary | ICD-10-CM | POA: Diagnosis not present

## 2021-01-28 DIAGNOSIS — I129 Hypertensive chronic kidney disease with stage 1 through stage 4 chronic kidney disease, or unspecified chronic kidney disease: Secondary | ICD-10-CM | POA: Diagnosis not present

## 2021-01-28 DIAGNOSIS — F32A Depression, unspecified: Secondary | ICD-10-CM | POA: Diagnosis not present

## 2021-01-28 DIAGNOSIS — Z86711 Personal history of pulmonary embolism: Secondary | ICD-10-CM | POA: Diagnosis not present

## 2021-01-28 DIAGNOSIS — Z7901 Long term (current) use of anticoagulants: Secondary | ICD-10-CM | POA: Diagnosis not present

## 2021-01-28 DIAGNOSIS — N189 Chronic kidney disease, unspecified: Secondary | ICD-10-CM | POA: Diagnosis not present

## 2021-01-28 DIAGNOSIS — Z87820 Personal history of traumatic brain injury: Secondary | ICD-10-CM | POA: Diagnosis not present

## 2021-01-28 DIAGNOSIS — M19012 Primary osteoarthritis, left shoulder: Secondary | ICD-10-CM | POA: Diagnosis not present

## 2021-01-28 DIAGNOSIS — Z79899 Other long term (current) drug therapy: Secondary | ICD-10-CM | POA: Diagnosis not present

## 2021-01-28 DIAGNOSIS — J449 Chronic obstructive pulmonary disease, unspecified: Secondary | ICD-10-CM | POA: Diagnosis not present

## 2021-01-28 DIAGNOSIS — K219 Gastro-esophageal reflux disease without esophagitis: Secondary | ICD-10-CM | POA: Diagnosis not present

## 2021-01-28 DIAGNOSIS — E785 Hyperlipidemia, unspecified: Secondary | ICD-10-CM | POA: Diagnosis not present

## 2021-01-28 DIAGNOSIS — Z96641 Presence of right artificial hip joint: Secondary | ICD-10-CM | POA: Diagnosis not present

## 2021-01-28 DIAGNOSIS — Z9181 History of falling: Secondary | ICD-10-CM | POA: Diagnosis not present

## 2021-01-28 DIAGNOSIS — Z8673 Personal history of transient ischemic attack (TIA), and cerebral infarction without residual deficits: Secondary | ICD-10-CM | POA: Diagnosis not present

## 2021-01-28 DIAGNOSIS — M75122 Complete rotator cuff tear or rupture of left shoulder, not specified as traumatic: Secondary | ICD-10-CM | POA: Diagnosis not present

## 2021-01-29 ENCOUNTER — Ambulatory Visit: Payer: Medicare Other | Admitting: Family Medicine

## 2021-01-29 ENCOUNTER — Telehealth: Payer: Self-pay | Admitting: Family Medicine

## 2021-01-29 ENCOUNTER — Other Ambulatory Visit: Payer: Self-pay

## 2021-01-29 VITALS — BP 130/82 | HR 68 | Ht 68.0 in | Wt 186.0 lb

## 2021-01-29 DIAGNOSIS — I129 Hypertensive chronic kidney disease with stage 1 through stage 4 chronic kidney disease, or unspecified chronic kidney disease: Secondary | ICD-10-CM | POA: Diagnosis not present

## 2021-01-29 DIAGNOSIS — S46012A Strain of muscle(s) and tendon(s) of the rotator cuff of left shoulder, initial encounter: Secondary | ICD-10-CM | POA: Diagnosis not present

## 2021-01-29 DIAGNOSIS — N189 Chronic kidney disease, unspecified: Secondary | ICD-10-CM | POA: Diagnosis not present

## 2021-01-29 DIAGNOSIS — Z87820 Personal history of traumatic brain injury: Secondary | ICD-10-CM | POA: Diagnosis not present

## 2021-01-29 DIAGNOSIS — Z86711 Personal history of pulmonary embolism: Secondary | ICD-10-CM | POA: Diagnosis not present

## 2021-01-29 DIAGNOSIS — F32A Depression, unspecified: Secondary | ICD-10-CM | POA: Diagnosis not present

## 2021-01-29 DIAGNOSIS — K219 Gastro-esophageal reflux disease without esophagitis: Secondary | ICD-10-CM | POA: Diagnosis not present

## 2021-01-29 DIAGNOSIS — Z9181 History of falling: Secondary | ICD-10-CM | POA: Diagnosis not present

## 2021-01-29 DIAGNOSIS — J449 Chronic obstructive pulmonary disease, unspecified: Secondary | ICD-10-CM | POA: Diagnosis not present

## 2021-01-29 DIAGNOSIS — M75122 Complete rotator cuff tear or rupture of left shoulder, not specified as traumatic: Secondary | ICD-10-CM | POA: Diagnosis not present

## 2021-01-29 DIAGNOSIS — R269 Unspecified abnormalities of gait and mobility: Secondary | ICD-10-CM | POA: Diagnosis not present

## 2021-01-29 DIAGNOSIS — G8929 Other chronic pain: Secondary | ICD-10-CM | POA: Diagnosis not present

## 2021-01-29 DIAGNOSIS — Z8673 Personal history of transient ischemic attack (TIA), and cerebral infarction without residual deficits: Secondary | ICD-10-CM | POA: Diagnosis not present

## 2021-01-29 DIAGNOSIS — E785 Hyperlipidemia, unspecified: Secondary | ICD-10-CM | POA: Diagnosis not present

## 2021-01-29 DIAGNOSIS — Z79899 Other long term (current) drug therapy: Secondary | ICD-10-CM | POA: Diagnosis not present

## 2021-01-29 DIAGNOSIS — Z7901 Long term (current) use of anticoagulants: Secondary | ICD-10-CM | POA: Diagnosis not present

## 2021-01-29 DIAGNOSIS — Z96641 Presence of right artificial hip joint: Secondary | ICD-10-CM | POA: Diagnosis not present

## 2021-01-29 DIAGNOSIS — M19012 Primary osteoarthritis, left shoulder: Secondary | ICD-10-CM | POA: Diagnosis not present

## 2021-01-29 NOTE — Patient Instructions (Addendum)
Thank you for coming in today.   Continue home health physical therapy  Check back 1 month. I can do a steroid injection at that time if needed.

## 2021-01-29 NOTE — Progress Notes (Signed)
   I, Steven Guerrero, LAT, ATC acting as a scribe for Steven Leader, MD.  Steven Guerrero is a 80 y.o. male who presents to [place] at [facility] today for L shoulder pain that was exacerbated by a fall the pt suffered last week, [place] mechanism on L arm. Pt was last seen by Dr. Georgina Snell on 01/11/21 and was advised to cont HEP. Pt's last [place] steroid injection was on 11/30/20. Today, pt locates pain to to the posterior aspect of his shoulder, but all over the shoulder joint. Pt is unable to ABD. No radiating pain noted.  Dx imaging: 11/30/20 L shoulder XR  Pertinent review of systems: No fevers or chills  Relevant historical information: History of stroke.  COPD.  Bronchitis.  History of PE.  General poor health.   Exam:  BP 130/82   Pulse 68   Ht 5\' 8"  (1.727 m)   Wt 186 lb (84.4 kg)   SpO2 98%   BMI 28.28 kg/m  General: Well Developed, well nourished, and in no acute distress.   MSK: Left shoulder normal-appearing mildly tender palpation lateral upper arm.  Shoulder range of motion limited to abduction. Strength internal rotation 5/5.  External rotation 3/5 external and abduction 3/5.    Lab and Radiology Results  X-ray report from remote x-ray obtained November 3 reviewed showing no fractures.  Images not available.     Assessment and Plan: 80 y.o. male with left shoulder pain after a fall.  Very likely suffered complete rotator cuff tear of the supraspinatus and infraspinatus.  We discussed that without surgery he likely will permanently have some weakness however I do not recommend surgery as he is a poor surgical candidate.  The patient agrees.  Plan to continue physical therapy as that is his best option.  With physical therapy we can expect to improve range of motion and significantly improved pain and some improved strength.  Plan to reorder home health PT to focus on his shoulder.  Recheck in 1 month.  At that time I could proceed with a repeat shoulder  injection if needed.   PDMP not reviewed this encounter. Orders Placed This Encounter  Procedures   Ambulatory referral to Home Health    Referral Priority:   Routine    Referral Type:   Home Health Care    Referral Reason:   Specialty Services Required    Requested Specialty:   [place]    Number of Visits Requested:   1   No orders of the defined types were placed in this encounter.    Discussed warning signs or symptoms. Please see discharge instructions. Patient expresses understanding.   The above documentation has been reviewed and is accurate and complete Steven Guerrero, M.D.

## 2021-01-29 NOTE — Telephone Encounter (Signed)
Received an x-ray report from mobile x-ray from home health of the left shoulder.  No fractures visible.  Mild to moderate joint space narrowing is present.  X-ray report will be sent to chart. X-ray report dated January 24, 2021.

## 2021-01-30 DIAGNOSIS — R3915 Urgency of urination: Secondary | ICD-10-CM | POA: Diagnosis not present

## 2021-01-30 DIAGNOSIS — R350 Frequency of micturition: Secondary | ICD-10-CM | POA: Diagnosis not present

## 2021-01-30 DIAGNOSIS — R3912 Poor urinary stream: Secondary | ICD-10-CM | POA: Diagnosis not present

## 2021-01-30 DIAGNOSIS — R3916 Straining to void: Secondary | ICD-10-CM | POA: Diagnosis not present

## 2021-02-05 ENCOUNTER — Telehealth: Payer: Self-pay | Admitting: *Deleted

## 2021-02-05 DIAGNOSIS — Z9181 History of falling: Secondary | ICD-10-CM | POA: Diagnosis not present

## 2021-02-05 DIAGNOSIS — J449 Chronic obstructive pulmonary disease, unspecified: Secondary | ICD-10-CM | POA: Diagnosis not present

## 2021-02-05 DIAGNOSIS — Z79899 Other long term (current) drug therapy: Secondary | ICD-10-CM | POA: Diagnosis not present

## 2021-02-05 DIAGNOSIS — Z96641 Presence of right artificial hip joint: Secondary | ICD-10-CM | POA: Diagnosis not present

## 2021-02-05 DIAGNOSIS — I129 Hypertensive chronic kidney disease with stage 1 through stage 4 chronic kidney disease, or unspecified chronic kidney disease: Secondary | ICD-10-CM | POA: Diagnosis not present

## 2021-02-05 DIAGNOSIS — Z7901 Long term (current) use of anticoagulants: Secondary | ICD-10-CM | POA: Diagnosis not present

## 2021-02-05 DIAGNOSIS — Z87820 Personal history of traumatic brain injury: Secondary | ICD-10-CM | POA: Diagnosis not present

## 2021-02-05 DIAGNOSIS — E785 Hyperlipidemia, unspecified: Secondary | ICD-10-CM | POA: Diagnosis not present

## 2021-02-05 DIAGNOSIS — Z86711 Personal history of pulmonary embolism: Secondary | ICD-10-CM | POA: Diagnosis not present

## 2021-02-05 DIAGNOSIS — N189 Chronic kidney disease, unspecified: Secondary | ICD-10-CM | POA: Diagnosis not present

## 2021-02-05 DIAGNOSIS — Z8673 Personal history of transient ischemic attack (TIA), and cerebral infarction without residual deficits: Secondary | ICD-10-CM | POA: Diagnosis not present

## 2021-02-05 DIAGNOSIS — M19012 Primary osteoarthritis, left shoulder: Secondary | ICD-10-CM | POA: Diagnosis not present

## 2021-02-05 DIAGNOSIS — F32A Depression, unspecified: Secondary | ICD-10-CM | POA: Diagnosis not present

## 2021-02-05 DIAGNOSIS — G8929 Other chronic pain: Secondary | ICD-10-CM | POA: Diagnosis not present

## 2021-02-05 DIAGNOSIS — M75122 Complete rotator cuff tear or rupture of left shoulder, not specified as traumatic: Secondary | ICD-10-CM | POA: Diagnosis not present

## 2021-02-05 DIAGNOSIS — K219 Gastro-esophageal reflux disease without esophagitis: Secondary | ICD-10-CM | POA: Diagnosis not present

## 2021-02-05 NOTE — Telephone Encounter (Signed)
Tillie Rung w/ home health PT called stating that pt would like to continue home health PT. However, the orders that were sent over were for iontophoresis, phonophoresis & this cannot be done in home. Tillie Rung needs a call back with a verbal okaying for her to continue in home PT w/o iontophoresis, phonophoresis.  [place]

## 2021-02-05 NOTE — Telephone Encounter (Signed)
Called and left message on Kendra's VM w/ verbal order to proceed w/ home health PT w/o phono- and iontophoresis.

## 2021-02-12 DIAGNOSIS — I129 Hypertensive chronic kidney disease with stage 1 through stage 4 chronic kidney disease, or unspecified chronic kidney disease: Secondary | ICD-10-CM | POA: Diagnosis not present

## 2021-02-12 DIAGNOSIS — F32A Depression, unspecified: Secondary | ICD-10-CM | POA: Diagnosis not present

## 2021-02-12 DIAGNOSIS — N189 Chronic kidney disease, unspecified: Secondary | ICD-10-CM | POA: Diagnosis not present

## 2021-02-12 DIAGNOSIS — M19012 Primary osteoarthritis, left shoulder: Secondary | ICD-10-CM | POA: Diagnosis not present

## 2021-02-12 DIAGNOSIS — K219 Gastro-esophageal reflux disease without esophagitis: Secondary | ICD-10-CM | POA: Diagnosis not present

## 2021-02-12 DIAGNOSIS — Z87820 Personal history of traumatic brain injury: Secondary | ICD-10-CM | POA: Diagnosis not present

## 2021-02-12 DIAGNOSIS — Z79899 Other long term (current) drug therapy: Secondary | ICD-10-CM | POA: Diagnosis not present

## 2021-02-12 DIAGNOSIS — Z96641 Presence of right artificial hip joint: Secondary | ICD-10-CM | POA: Diagnosis not present

## 2021-02-12 DIAGNOSIS — Z9181 History of falling: Secondary | ICD-10-CM | POA: Diagnosis not present

## 2021-02-12 DIAGNOSIS — Z8673 Personal history of transient ischemic attack (TIA), and cerebral infarction without residual deficits: Secondary | ICD-10-CM | POA: Diagnosis not present

## 2021-02-12 DIAGNOSIS — J449 Chronic obstructive pulmonary disease, unspecified: Secondary | ICD-10-CM | POA: Diagnosis not present

## 2021-02-12 DIAGNOSIS — M75122 Complete rotator cuff tear or rupture of left shoulder, not specified as traumatic: Secondary | ICD-10-CM | POA: Diagnosis not present

## 2021-02-12 DIAGNOSIS — E785 Hyperlipidemia, unspecified: Secondary | ICD-10-CM | POA: Diagnosis not present

## 2021-02-12 DIAGNOSIS — Z86711 Personal history of pulmonary embolism: Secondary | ICD-10-CM | POA: Diagnosis not present

## 2021-02-12 DIAGNOSIS — G8929 Other chronic pain: Secondary | ICD-10-CM | POA: Diagnosis not present

## 2021-02-12 DIAGNOSIS — Z7901 Long term (current) use of anticoagulants: Secondary | ICD-10-CM | POA: Diagnosis not present

## 2021-02-13 DIAGNOSIS — R1310 Dysphagia, unspecified: Secondary | ICD-10-CM | POA: Diagnosis not present

## 2021-02-13 DIAGNOSIS — K5900 Constipation, unspecified: Secondary | ICD-10-CM | POA: Diagnosis not present

## 2021-02-15 DIAGNOSIS — Z8673 Personal history of transient ischemic attack (TIA), and cerebral infarction without residual deficits: Secondary | ICD-10-CM | POA: Diagnosis not present

## 2021-02-15 DIAGNOSIS — G8929 Other chronic pain: Secondary | ICD-10-CM | POA: Diagnosis not present

## 2021-02-15 DIAGNOSIS — M19012 Primary osteoarthritis, left shoulder: Secondary | ICD-10-CM | POA: Diagnosis not present

## 2021-02-15 DIAGNOSIS — Z9181 History of falling: Secondary | ICD-10-CM | POA: Diagnosis not present

## 2021-02-15 DIAGNOSIS — J449 Chronic obstructive pulmonary disease, unspecified: Secondary | ICD-10-CM | POA: Diagnosis not present

## 2021-02-15 DIAGNOSIS — Z96641 Presence of right artificial hip joint: Secondary | ICD-10-CM | POA: Diagnosis not present

## 2021-02-15 DIAGNOSIS — Z86711 Personal history of pulmonary embolism: Secondary | ICD-10-CM | POA: Diagnosis not present

## 2021-02-15 DIAGNOSIS — F32A Depression, unspecified: Secondary | ICD-10-CM | POA: Diagnosis not present

## 2021-02-15 DIAGNOSIS — N189 Chronic kidney disease, unspecified: Secondary | ICD-10-CM | POA: Diagnosis not present

## 2021-02-15 DIAGNOSIS — Z79899 Other long term (current) drug therapy: Secondary | ICD-10-CM | POA: Diagnosis not present

## 2021-02-15 DIAGNOSIS — I129 Hypertensive chronic kidney disease with stage 1 through stage 4 chronic kidney disease, or unspecified chronic kidney disease: Secondary | ICD-10-CM | POA: Diagnosis not present

## 2021-02-15 DIAGNOSIS — Z7901 Long term (current) use of anticoagulants: Secondary | ICD-10-CM | POA: Diagnosis not present

## 2021-02-15 DIAGNOSIS — K219 Gastro-esophageal reflux disease without esophagitis: Secondary | ICD-10-CM | POA: Diagnosis not present

## 2021-02-15 DIAGNOSIS — E785 Hyperlipidemia, unspecified: Secondary | ICD-10-CM | POA: Diagnosis not present

## 2021-02-15 DIAGNOSIS — Z87820 Personal history of traumatic brain injury: Secondary | ICD-10-CM | POA: Diagnosis not present

## 2021-02-15 DIAGNOSIS — M75122 Complete rotator cuff tear or rupture of left shoulder, not specified as traumatic: Secondary | ICD-10-CM | POA: Diagnosis not present

## 2021-02-20 DIAGNOSIS — Z9181 History of falling: Secondary | ICD-10-CM | POA: Diagnosis not present

## 2021-02-20 DIAGNOSIS — G8929 Other chronic pain: Secondary | ICD-10-CM | POA: Diagnosis not present

## 2021-02-20 DIAGNOSIS — M19012 Primary osteoarthritis, left shoulder: Secondary | ICD-10-CM | POA: Diagnosis not present

## 2021-02-20 DIAGNOSIS — Z79899 Other long term (current) drug therapy: Secondary | ICD-10-CM | POA: Diagnosis not present

## 2021-02-20 DIAGNOSIS — R1312 Dysphagia, oropharyngeal phase: Secondary | ICD-10-CM | POA: Diagnosis not present

## 2021-02-20 DIAGNOSIS — E785 Hyperlipidemia, unspecified: Secondary | ICD-10-CM | POA: Diagnosis not present

## 2021-02-20 DIAGNOSIS — K219 Gastro-esophageal reflux disease without esophagitis: Secondary | ICD-10-CM | POA: Diagnosis not present

## 2021-02-20 DIAGNOSIS — J449 Chronic obstructive pulmonary disease, unspecified: Secondary | ICD-10-CM | POA: Diagnosis not present

## 2021-02-20 DIAGNOSIS — I69322 Dysarthria following cerebral infarction: Secondary | ICD-10-CM | POA: Diagnosis not present

## 2021-02-20 DIAGNOSIS — I129 Hypertensive chronic kidney disease with stage 1 through stage 4 chronic kidney disease, or unspecified chronic kidney disease: Secondary | ICD-10-CM | POA: Diagnosis not present

## 2021-02-20 DIAGNOSIS — N189 Chronic kidney disease, unspecified: Secondary | ICD-10-CM | POA: Diagnosis not present

## 2021-02-20 DIAGNOSIS — F32A Depression, unspecified: Secondary | ICD-10-CM | POA: Diagnosis not present

## 2021-02-20 DIAGNOSIS — M75122 Complete rotator cuff tear or rupture of left shoulder, not specified as traumatic: Secondary | ICD-10-CM | POA: Diagnosis not present

## 2021-02-20 DIAGNOSIS — Z96641 Presence of right artificial hip joint: Secondary | ICD-10-CM | POA: Diagnosis not present

## 2021-02-20 DIAGNOSIS — Z87820 Personal history of traumatic brain injury: Secondary | ICD-10-CM | POA: Diagnosis not present

## 2021-02-20 DIAGNOSIS — Z7901 Long term (current) use of anticoagulants: Secondary | ICD-10-CM | POA: Diagnosis not present

## 2021-02-20 DIAGNOSIS — Z86711 Personal history of pulmonary embolism: Secondary | ICD-10-CM | POA: Diagnosis not present

## 2021-02-25 ENCOUNTER — Other Ambulatory Visit: Payer: Self-pay

## 2021-02-25 ENCOUNTER — Ambulatory Visit: Payer: Self-pay

## 2021-02-25 ENCOUNTER — Ambulatory Visit: Payer: Medicare Other | Admitting: Family Medicine

## 2021-02-25 VITALS — BP 138/82 | HR 68 | Ht 68.0 in | Wt 191.8 lb

## 2021-02-25 DIAGNOSIS — M25512 Pain in left shoulder: Secondary | ICD-10-CM | POA: Diagnosis not present

## 2021-02-25 DIAGNOSIS — G8929 Other chronic pain: Secondary | ICD-10-CM

## 2021-02-25 NOTE — Patient Instructions (Addendum)
Thank you for coming in today.   You received a steroid injection in your left shoulder today. Seek immediate medical attention if the joint becomes red, extremely painful, or is oozing fluid.   Recheck as needed.

## 2021-02-25 NOTE — Progress Notes (Signed)
I, [name], LAT, ATC acting as a scribe for [name], MD.  Steven Guerrero is a 80 y.o. male who presents to [place] at [facility] today for L shoulder pain due a traumatic RC tear (likely supraspinatus and infraspinatus) after suffering a fall. Pt was last seen by Dr. Georgina Snell on 01/29/21 and home health PT was re-ordered. Today pt reports L shoulder is very painful and PT isn't able to do much due to pain. Pt continues to have a shuffled gain when ambulating.  Dx imaging: 11/30/20 L shoulder XR  Pertinent review of systems: No fevers or chills  Relevant historical information: History of stroke.   Exam:  BP 138/82   Pulse 68   Ht 5\' 8"  (1.727 m)   Wt 191 lb 12.8 oz (87 kg)   SpO2 97%   BMI 29.16 kg/m  General: Well Developed, well nourished, and in no acute distress.   MSK: Left shoulder normal-appearing Decreased range of motion and strength.    Lab and Radiology Results  Procedure: Real-time Ultrasound Guided Injection of the left shoulder glenohumeral  joint posterior approach Device: Philips Affiniti 50G Images permanently stored and available for review in PACS Verbal informed consent obtained.  Discussed risks and benefits of procedure. Warned about infection bleeding damage to structures skin hypopigmentation and fat atrophy among others. Patient expresses understanding and agreement Time-out conducted.   Noted no overlying erythema, induration, or other signs of local infection.   Skin prepped in a sterile fashion.   Local anesthesia: Topical Ethyl chloride.   With sterile technique and under real time ultrasound guidance: 40 mg of Kenalog and 2 mL of Marcaine injected into shoulder joint. Fluid seen entering the joint capsule.   Completed without difficulty   Pain immediately resolved suggesting accurate placement of the medication.   Advised to call if fevers/chills, erythema, induration, drainage, or persistent bleeding.   Images  permanently stored and available for review in the ultrasound unit.  Impression: Technically successful ultrasound guided injection.      EXAM: LEFT SHOULDER - 2+ VIEW   COMPARISON:  None.   FINDINGS: Normal alignment. No fracture or dislocation. Mild glenohumeral degenerative arthritis. Acromioclavicular joint space is not optimally profiled, but appears preserved on axillary view. Multiple pleural plaques noted within the left hemithorax.   IMPRESSION: Mild glenohumeral degenerative arthritis.     Electronically Signed   By: Fidela Salisbury M.D.   On: 12/03/2020 13:32 I, [name], personally (independently) visualized and performed the interpretation of the images attached in this note.      Assessment and Plan: 80 y.o. male with left shoulder pain thought to be due to chronic rotator cuff tear as well as some glenohumeral DJD.  Patient did well with prior steroid injection about 3 months ago.  He is currently receiving PT with mild benefit.  Plan for repeat steroid injection and continued PT and home exercise program.  If this is not sufficient would recommend referral to [facility] for block and ablation attempt.   PDMP not reviewed this encounter. Orders Placed This Encounter  Procedures   [place] LIMITED JOINT SPACE STRUCTURES UP LEFT(NO LINKED CHARGES)    Standing Status:   Future    Number of Occurrences:   1    Standing Expiration Date:   08/26/2021    Order Specific Question:   Reason for Exam (SYMPTOM  OR DIAGNOSIS REQUIRED)    Answer:   left shoulder pain    Order Specific  Question:   Preferred imaging location?    Answer:   [place]   No orders of the defined types were placed in this encounter.    Discussed warning signs or symptoms. Please see discharge instructions. Patient expresses understanding.   The above documentation has been reviewed and is accurate and complete [name], M.D.

## 2021-02-26 ENCOUNTER — Ambulatory Visit: Payer: Medicare Other | Admitting: Family Medicine

## 2021-02-27 DIAGNOSIS — R634 Abnormal weight loss: Secondary | ICD-10-CM | POA: Diagnosis not present

## 2021-02-27 DIAGNOSIS — R350 Frequency of micturition: Secondary | ICD-10-CM | POA: Diagnosis not present

## 2021-02-27 DIAGNOSIS — R9389 Abnormal findings on diagnostic imaging of other specified body structures: Secondary | ICD-10-CM | POA: Diagnosis not present

## 2021-02-28 DIAGNOSIS — Z87820 Personal history of traumatic brain injury: Secondary | ICD-10-CM | POA: Diagnosis not present

## 2021-02-28 DIAGNOSIS — Z86711 Personal history of pulmonary embolism: Secondary | ICD-10-CM | POA: Diagnosis not present

## 2021-02-28 DIAGNOSIS — Z7901 Long term (current) use of anticoagulants: Secondary | ICD-10-CM | POA: Diagnosis not present

## 2021-02-28 DIAGNOSIS — N189 Chronic kidney disease, unspecified: Secondary | ICD-10-CM | POA: Diagnosis not present

## 2021-02-28 DIAGNOSIS — I129 Hypertensive chronic kidney disease with stage 1 through stage 4 chronic kidney disease, or unspecified chronic kidney disease: Secondary | ICD-10-CM | POA: Diagnosis not present

## 2021-02-28 DIAGNOSIS — Z79899 Other long term (current) drug therapy: Secondary | ICD-10-CM | POA: Diagnosis not present

## 2021-02-28 DIAGNOSIS — K219 Gastro-esophageal reflux disease without esophagitis: Secondary | ICD-10-CM | POA: Diagnosis not present

## 2021-02-28 DIAGNOSIS — M19012 Primary osteoarthritis, left shoulder: Secondary | ICD-10-CM | POA: Diagnosis not present

## 2021-02-28 DIAGNOSIS — J449 Chronic obstructive pulmonary disease, unspecified: Secondary | ICD-10-CM | POA: Diagnosis not present

## 2021-02-28 DIAGNOSIS — E785 Hyperlipidemia, unspecified: Secondary | ICD-10-CM | POA: Diagnosis not present

## 2021-02-28 DIAGNOSIS — M75122 Complete rotator cuff tear or rupture of left shoulder, not specified as traumatic: Secondary | ICD-10-CM | POA: Diagnosis not present

## 2021-02-28 DIAGNOSIS — Z96641 Presence of right artificial hip joint: Secondary | ICD-10-CM | POA: Diagnosis not present

## 2021-02-28 DIAGNOSIS — I69322 Dysarthria following cerebral infarction: Secondary | ICD-10-CM | POA: Diagnosis not present

## 2021-02-28 DIAGNOSIS — G8929 Other chronic pain: Secondary | ICD-10-CM | POA: Diagnosis not present

## 2021-02-28 DIAGNOSIS — Z9181 History of falling: Secondary | ICD-10-CM | POA: Diagnosis not present

## 2021-02-28 DIAGNOSIS — R1312 Dysphagia, oropharyngeal phase: Secondary | ICD-10-CM | POA: Diagnosis not present

## 2021-02-28 DIAGNOSIS — F32A Depression, unspecified: Secondary | ICD-10-CM | POA: Diagnosis not present

## 2021-03-06 DIAGNOSIS — Z9181 History of falling: Secondary | ICD-10-CM | POA: Diagnosis not present

## 2021-03-06 DIAGNOSIS — Z86711 Personal history of pulmonary embolism: Secondary | ICD-10-CM | POA: Diagnosis not present

## 2021-03-06 DIAGNOSIS — E785 Hyperlipidemia, unspecified: Secondary | ICD-10-CM | POA: Diagnosis not present

## 2021-03-06 DIAGNOSIS — J449 Chronic obstructive pulmonary disease, unspecified: Secondary | ICD-10-CM | POA: Diagnosis not present

## 2021-03-06 DIAGNOSIS — F32A Depression, unspecified: Secondary | ICD-10-CM | POA: Diagnosis not present

## 2021-03-06 DIAGNOSIS — Z96641 Presence of right artificial hip joint: Secondary | ICD-10-CM | POA: Diagnosis not present

## 2021-03-06 DIAGNOSIS — G8929 Other chronic pain: Secondary | ICD-10-CM | POA: Diagnosis not present

## 2021-03-06 DIAGNOSIS — I69322 Dysarthria following cerebral infarction: Secondary | ICD-10-CM | POA: Diagnosis not present

## 2021-03-06 DIAGNOSIS — M75122 Complete rotator cuff tear or rupture of left shoulder, not specified as traumatic: Secondary | ICD-10-CM | POA: Diagnosis not present

## 2021-03-06 DIAGNOSIS — N189 Chronic kidney disease, unspecified: Secondary | ICD-10-CM | POA: Diagnosis not present

## 2021-03-06 DIAGNOSIS — Z7901 Long term (current) use of anticoagulants: Secondary | ICD-10-CM | POA: Diagnosis not present

## 2021-03-06 DIAGNOSIS — Z87820 Personal history of traumatic brain injury: Secondary | ICD-10-CM | POA: Diagnosis not present

## 2021-03-06 DIAGNOSIS — M19012 Primary osteoarthritis, left shoulder: Secondary | ICD-10-CM | POA: Diagnosis not present

## 2021-03-06 DIAGNOSIS — I129 Hypertensive chronic kidney disease with stage 1 through stage 4 chronic kidney disease, or unspecified chronic kidney disease: Secondary | ICD-10-CM | POA: Diagnosis not present

## 2021-03-06 DIAGNOSIS — K219 Gastro-esophageal reflux disease without esophagitis: Secondary | ICD-10-CM | POA: Diagnosis not present

## 2021-03-06 DIAGNOSIS — R1312 Dysphagia, oropharyngeal phase: Secondary | ICD-10-CM | POA: Diagnosis not present

## 2021-03-06 DIAGNOSIS — Z79899 Other long term (current) drug therapy: Secondary | ICD-10-CM | POA: Diagnosis not present

## 2021-03-08 DIAGNOSIS — J449 Chronic obstructive pulmonary disease, unspecified: Secondary | ICD-10-CM | POA: Diagnosis not present

## 2021-03-08 DIAGNOSIS — N189 Chronic kidney disease, unspecified: Secondary | ICD-10-CM | POA: Diagnosis not present

## 2021-03-08 DIAGNOSIS — Z86711 Personal history of pulmonary embolism: Secondary | ICD-10-CM | POA: Diagnosis not present

## 2021-03-08 DIAGNOSIS — M19012 Primary osteoarthritis, left shoulder: Secondary | ICD-10-CM | POA: Diagnosis not present

## 2021-03-08 DIAGNOSIS — E785 Hyperlipidemia, unspecified: Secondary | ICD-10-CM | POA: Diagnosis not present

## 2021-03-08 DIAGNOSIS — I69322 Dysarthria following cerebral infarction: Secondary | ICD-10-CM | POA: Diagnosis not present

## 2021-03-08 DIAGNOSIS — Z96641 Presence of right artificial hip joint: Secondary | ICD-10-CM | POA: Diagnosis not present

## 2021-03-08 DIAGNOSIS — F32A Depression, unspecified: Secondary | ICD-10-CM | POA: Diagnosis not present

## 2021-03-08 DIAGNOSIS — Z9181 History of falling: Secondary | ICD-10-CM | POA: Diagnosis not present

## 2021-03-08 DIAGNOSIS — K219 Gastro-esophageal reflux disease without esophagitis: Secondary | ICD-10-CM | POA: Diagnosis not present

## 2021-03-08 DIAGNOSIS — Z87820 Personal history of traumatic brain injury: Secondary | ICD-10-CM | POA: Diagnosis not present

## 2021-03-08 DIAGNOSIS — Z7901 Long term (current) use of anticoagulants: Secondary | ICD-10-CM | POA: Diagnosis not present

## 2021-03-08 DIAGNOSIS — I129 Hypertensive chronic kidney disease with stage 1 through stage 4 chronic kidney disease, or unspecified chronic kidney disease: Secondary | ICD-10-CM | POA: Diagnosis not present

## 2021-03-08 DIAGNOSIS — Z79899 Other long term (current) drug therapy: Secondary | ICD-10-CM | POA: Diagnosis not present

## 2021-03-08 DIAGNOSIS — R1312 Dysphagia, oropharyngeal phase: Secondary | ICD-10-CM | POA: Diagnosis not present

## 2021-03-08 DIAGNOSIS — G8929 Other chronic pain: Secondary | ICD-10-CM | POA: Diagnosis not present

## 2021-03-08 DIAGNOSIS — M75122 Complete rotator cuff tear or rupture of left shoulder, not specified as traumatic: Secondary | ICD-10-CM | POA: Diagnosis not present

## 2021-03-12 DIAGNOSIS — N189 Chronic kidney disease, unspecified: Secondary | ICD-10-CM | POA: Diagnosis not present

## 2021-03-12 DIAGNOSIS — Z9181 History of falling: Secondary | ICD-10-CM | POA: Diagnosis not present

## 2021-03-12 DIAGNOSIS — Z87820 Personal history of traumatic brain injury: Secondary | ICD-10-CM | POA: Diagnosis not present

## 2021-03-12 DIAGNOSIS — Z7901 Long term (current) use of anticoagulants: Secondary | ICD-10-CM | POA: Diagnosis not present

## 2021-03-12 DIAGNOSIS — M75122 Complete rotator cuff tear or rupture of left shoulder, not specified as traumatic: Secondary | ICD-10-CM | POA: Diagnosis not present

## 2021-03-12 DIAGNOSIS — I69322 Dysarthria following cerebral infarction: Secondary | ICD-10-CM | POA: Diagnosis not present

## 2021-03-12 DIAGNOSIS — J449 Chronic obstructive pulmonary disease, unspecified: Secondary | ICD-10-CM | POA: Diagnosis not present

## 2021-03-12 DIAGNOSIS — R1312 Dysphagia, oropharyngeal phase: Secondary | ICD-10-CM | POA: Diagnosis not present

## 2021-03-12 DIAGNOSIS — G8929 Other chronic pain: Secondary | ICD-10-CM | POA: Diagnosis not present

## 2021-03-12 DIAGNOSIS — E785 Hyperlipidemia, unspecified: Secondary | ICD-10-CM | POA: Diagnosis not present

## 2021-03-12 DIAGNOSIS — Z79899 Other long term (current) drug therapy: Secondary | ICD-10-CM | POA: Diagnosis not present

## 2021-03-12 DIAGNOSIS — Z86711 Personal history of pulmonary embolism: Secondary | ICD-10-CM | POA: Diagnosis not present

## 2021-03-12 DIAGNOSIS — Z96641 Presence of right artificial hip joint: Secondary | ICD-10-CM | POA: Diagnosis not present

## 2021-03-12 DIAGNOSIS — I129 Hypertensive chronic kidney disease with stage 1 through stage 4 chronic kidney disease, or unspecified chronic kidney disease: Secondary | ICD-10-CM | POA: Diagnosis not present

## 2021-03-12 DIAGNOSIS — M19012 Primary osteoarthritis, left shoulder: Secondary | ICD-10-CM | POA: Diagnosis not present

## 2021-03-12 DIAGNOSIS — F32A Depression, unspecified: Secondary | ICD-10-CM | POA: Diagnosis not present

## 2021-03-12 DIAGNOSIS — K219 Gastro-esophageal reflux disease without esophagitis: Secondary | ICD-10-CM | POA: Diagnosis not present

## 2021-03-14 DIAGNOSIS — I69322 Dysarthria following cerebral infarction: Secondary | ICD-10-CM | POA: Diagnosis not present

## 2021-03-14 DIAGNOSIS — Z7901 Long term (current) use of anticoagulants: Secondary | ICD-10-CM | POA: Diagnosis not present

## 2021-03-14 DIAGNOSIS — Z9181 History of falling: Secondary | ICD-10-CM | POA: Diagnosis not present

## 2021-03-14 DIAGNOSIS — M75122 Complete rotator cuff tear or rupture of left shoulder, not specified as traumatic: Secondary | ICD-10-CM | POA: Diagnosis not present

## 2021-03-14 DIAGNOSIS — Z86711 Personal history of pulmonary embolism: Secondary | ICD-10-CM | POA: Diagnosis not present

## 2021-03-14 DIAGNOSIS — Z79899 Other long term (current) drug therapy: Secondary | ICD-10-CM | POA: Diagnosis not present

## 2021-03-14 DIAGNOSIS — N189 Chronic kidney disease, unspecified: Secondary | ICD-10-CM | POA: Diagnosis not present

## 2021-03-14 DIAGNOSIS — K219 Gastro-esophageal reflux disease without esophagitis: Secondary | ICD-10-CM | POA: Diagnosis not present

## 2021-03-14 DIAGNOSIS — E785 Hyperlipidemia, unspecified: Secondary | ICD-10-CM | POA: Diagnosis not present

## 2021-03-14 DIAGNOSIS — F32A Depression, unspecified: Secondary | ICD-10-CM | POA: Diagnosis not present

## 2021-03-14 DIAGNOSIS — R1312 Dysphagia, oropharyngeal phase: Secondary | ICD-10-CM | POA: Diagnosis not present

## 2021-03-14 DIAGNOSIS — Z96641 Presence of right artificial hip joint: Secondary | ICD-10-CM | POA: Diagnosis not present

## 2021-03-14 DIAGNOSIS — J449 Chronic obstructive pulmonary disease, unspecified: Secondary | ICD-10-CM | POA: Diagnosis not present

## 2021-03-14 DIAGNOSIS — G8929 Other chronic pain: Secondary | ICD-10-CM | POA: Diagnosis not present

## 2021-03-14 DIAGNOSIS — M19012 Primary osteoarthritis, left shoulder: Secondary | ICD-10-CM | POA: Diagnosis not present

## 2021-03-14 DIAGNOSIS — I129 Hypertensive chronic kidney disease with stage 1 through stage 4 chronic kidney disease, or unspecified chronic kidney disease: Secondary | ICD-10-CM | POA: Diagnosis not present

## 2021-03-14 DIAGNOSIS — Z87820 Personal history of traumatic brain injury: Secondary | ICD-10-CM | POA: Diagnosis not present

## 2021-03-20 DIAGNOSIS — J449 Chronic obstructive pulmonary disease, unspecified: Secondary | ICD-10-CM | POA: Diagnosis not present

## 2021-03-20 DIAGNOSIS — I129 Hypertensive chronic kidney disease with stage 1 through stage 4 chronic kidney disease, or unspecified chronic kidney disease: Secondary | ICD-10-CM | POA: Diagnosis not present

## 2021-03-20 DIAGNOSIS — M19012 Primary osteoarthritis, left shoulder: Secondary | ICD-10-CM | POA: Diagnosis not present

## 2021-03-20 DIAGNOSIS — N189 Chronic kidney disease, unspecified: Secondary | ICD-10-CM | POA: Diagnosis not present

## 2021-03-20 DIAGNOSIS — F32A Depression, unspecified: Secondary | ICD-10-CM | POA: Diagnosis not present

## 2021-03-20 DIAGNOSIS — Z87820 Personal history of traumatic brain injury: Secondary | ICD-10-CM | POA: Diagnosis not present

## 2021-03-20 DIAGNOSIS — E785 Hyperlipidemia, unspecified: Secondary | ICD-10-CM | POA: Diagnosis not present

## 2021-03-20 DIAGNOSIS — Z7901 Long term (current) use of anticoagulants: Secondary | ICD-10-CM | POA: Diagnosis not present

## 2021-03-20 DIAGNOSIS — M75122 Complete rotator cuff tear or rupture of left shoulder, not specified as traumatic: Secondary | ICD-10-CM | POA: Diagnosis not present

## 2021-03-20 DIAGNOSIS — D649 Anemia, unspecified: Secondary | ICD-10-CM | POA: Diagnosis not present

## 2021-03-20 DIAGNOSIS — R1312 Dysphagia, oropharyngeal phase: Secondary | ICD-10-CM | POA: Diagnosis not present

## 2021-03-20 DIAGNOSIS — Z96641 Presence of right artificial hip joint: Secondary | ICD-10-CM | POA: Diagnosis not present

## 2021-03-20 DIAGNOSIS — G8929 Other chronic pain: Secondary | ICD-10-CM | POA: Diagnosis not present

## 2021-03-20 DIAGNOSIS — Z9181 History of falling: Secondary | ICD-10-CM | POA: Diagnosis not present

## 2021-03-20 DIAGNOSIS — Z79899 Other long term (current) drug therapy: Secondary | ICD-10-CM | POA: Diagnosis not present

## 2021-03-20 DIAGNOSIS — Z86711 Personal history of pulmonary embolism: Secondary | ICD-10-CM | POA: Diagnosis not present

## 2021-03-20 DIAGNOSIS — I69322 Dysarthria following cerebral infarction: Secondary | ICD-10-CM | POA: Diagnosis not present

## 2021-03-20 DIAGNOSIS — K219 Gastro-esophageal reflux disease without esophagitis: Secondary | ICD-10-CM | POA: Diagnosis not present

## 2021-03-22 DIAGNOSIS — R350 Frequency of micturition: Secondary | ICD-10-CM | POA: Diagnosis not present

## 2021-03-22 DIAGNOSIS — R3915 Urgency of urination: Secondary | ICD-10-CM | POA: Diagnosis not present

## 2021-03-27 DIAGNOSIS — I129 Hypertensive chronic kidney disease with stage 1 through stage 4 chronic kidney disease, or unspecified chronic kidney disease: Secondary | ICD-10-CM | POA: Diagnosis not present

## 2021-03-27 DIAGNOSIS — Z87820 Personal history of traumatic brain injury: Secondary | ICD-10-CM | POA: Diagnosis not present

## 2021-03-27 DIAGNOSIS — Z86711 Personal history of pulmonary embolism: Secondary | ICD-10-CM | POA: Diagnosis not present

## 2021-03-27 DIAGNOSIS — M19012 Primary osteoarthritis, left shoulder: Secondary | ICD-10-CM | POA: Diagnosis not present

## 2021-03-27 DIAGNOSIS — Z79899 Other long term (current) drug therapy: Secondary | ICD-10-CM | POA: Diagnosis not present

## 2021-03-27 DIAGNOSIS — M75122 Complete rotator cuff tear or rupture of left shoulder, not specified as traumatic: Secondary | ICD-10-CM | POA: Diagnosis not present

## 2021-03-27 DIAGNOSIS — Z96641 Presence of right artificial hip joint: Secondary | ICD-10-CM | POA: Diagnosis not present

## 2021-03-27 DIAGNOSIS — J449 Chronic obstructive pulmonary disease, unspecified: Secondary | ICD-10-CM | POA: Diagnosis not present

## 2021-03-27 DIAGNOSIS — R1312 Dysphagia, oropharyngeal phase: Secondary | ICD-10-CM | POA: Diagnosis not present

## 2021-03-27 DIAGNOSIS — G8929 Other chronic pain: Secondary | ICD-10-CM | POA: Diagnosis not present

## 2021-03-27 DIAGNOSIS — E785 Hyperlipidemia, unspecified: Secondary | ICD-10-CM | POA: Diagnosis not present

## 2021-03-27 DIAGNOSIS — Z9181 History of falling: Secondary | ICD-10-CM | POA: Diagnosis not present

## 2021-03-27 DIAGNOSIS — Z7901 Long term (current) use of anticoagulants: Secondary | ICD-10-CM | POA: Diagnosis not present

## 2021-03-27 DIAGNOSIS — I69322 Dysarthria following cerebral infarction: Secondary | ICD-10-CM | POA: Diagnosis not present

## 2021-03-27 DIAGNOSIS — F32A Depression, unspecified: Secondary | ICD-10-CM | POA: Diagnosis not present

## 2021-03-27 DIAGNOSIS — N189 Chronic kidney disease, unspecified: Secondary | ICD-10-CM | POA: Diagnosis not present

## 2021-03-27 DIAGNOSIS — K219 Gastro-esophageal reflux disease without esophagitis: Secondary | ICD-10-CM | POA: Diagnosis not present

## 2021-04-23 DIAGNOSIS — R634 Abnormal weight loss: Secondary | ICD-10-CM | POA: Diagnosis not present

## 2021-04-23 DIAGNOSIS — D649 Anemia, unspecified: Secondary | ICD-10-CM | POA: Diagnosis not present

## 2021-04-29 ENCOUNTER — Other Ambulatory Visit: Payer: Self-pay

## 2021-04-29 ENCOUNTER — Encounter: Payer: Self-pay | Admitting: Pulmonary Disease

## 2021-04-29 ENCOUNTER — Ambulatory Visit: Payer: Medicare Other | Admitting: Pulmonary Disease

## 2021-04-29 VITALS — BP 102/60 | HR 78 | Ht 71.5 in | Wt 193.2 lb

## 2021-04-29 DIAGNOSIS — J449 Chronic obstructive pulmonary disease, unspecified: Secondary | ICD-10-CM | POA: Diagnosis not present

## 2021-04-29 NOTE — Patient Instructions (Addendum)
Emphysema Chronic bronchitis --CONTINUE DUONEBs up to THREE times a day AS NEEDED for shortness of breath or wheezing  COPD Action Plan Increase DUONEBS to [place] for worsening shortness of breath, wheezing and cough. If you symptoms do not improve in 24-48 hours, please our office for evaluation and/or steroid taper.  Follow-up with me in 6 months

## 2021-04-29 NOTE — Progress Notes (Signed)
Subjective:   [name] GENDER: male DOB: 1940-07-26, MRN: [id number]   HPI  Chief Complaint  Patient presents with   Follow-up    COPD/emphysema    Reason for Visit: COPD follow-up  [name] is a 81 year old male former smoker with hx CVA in 2001, hx TBI in 2003, hx post-op PE on xarelto, OSA not on CPAP who presents as a new consult.   Synopsis: 2022 - Established care in Oct. Bronchitis symptoms with nocturnal awakening of wheezing and productive cough.  He was diagnosed with OSA >10 years ago. Does not like to wear CPAP due to facial weakness. Was started on nebulizer treatment for COPD  04/29/21 Since our last visit he has been taking Duonebs once a day with a day. Some shortness of breath. Denies coughing and wheezing, which is improved. No longer having nocturnal symptoms. No exacerbations since our last visit.  Social History: Quit in 2008. Smoked 1ppd x 40 years  Environmental exposures: Asbestosis during insulation for 5-6 years.  Past Medical History:  Diagnosis Date   Anxiety and depression    Back pain    Brain bleed ([place])    2003   CVA (cerebral infarction)    2001   Hypercholesteremia    Hypertension    Peripheral neuropathy    Pulmonary embolism (HCC)    Sleep apnea    Stroke ([place])    2001   TBI (traumatic brain injury)      Family History  Problem Relation Age of Onset   Heart attack Father        Mother - health hx unknown     Social History   Occupational History   Occupation: Retired  Tobacco Use   Smoking status: Former    Packs/day: 1.50    Years: 47.00    Pack years: 70.50    Types: Cigarettes    Quit date: 03/24/2006    Years since quitting: 15.1   Smokeless tobacco: Never  Vaping Use   Vaping Use: Never used  Substance and Sexual Activity   Alcohol use: Yes    Alcohol/week: 0.0 standard drinks    Comment: 1 beer per day   Drug use: No   Sexual activity: Not on file    Allergies  Allergen  Reactions   Atenolol Other (See Comments)    Does not remember reaction   Lotensin [Benazepril Hcl] Other (See Comments)    Does not remember reaction   Wellbutrin [Bupropion] Other (See Comments)    Does not remember reaction     Outpatient Medications Prior to Visit  Medication Sig Dispense Refill   amLODipine (NORVASC) 10 MG tablet Take 10 mg by mouth daily.     atorvastatin (LIPITOR) 20 MG tablet Take 20 mg by mouth daily.      citalopram (CELEXA) 40 MG tablet Take 40 mg by mouth at bedtime.      Evolocumab (REPATHA) 140 MG/ML SOSY Inject 140 mg into the skin every 14 (fourteen) days. Tuesday     Ferrous Gluconate-C-Folic Acid (IRON-C PO) Take 1 tablet by mouth at bedtime.     fish oil-omega-3 fatty acids 1000 MG capsule Take 1 g by mouth daily.     ipratropium-albuterol (DUONEB) 0.5-2.5 (3) MG/3ML SOLN Take 3 mLs by nebulization every 6 (six) hours. 360 mL 11   losartan (COZAAR) 100 MG tablet Take 100 mg by mouth daily.     mirtazapine (REMERON) 15 MG tablet Take  15 mg by mouth daily.     Multiple Vitamin (MULTIVITAMIN WITH MINERALS) TABS tablet Take 1 tablet by mouth daily.     OVER THE COUNTER MEDICATION Take 1 tablet by mouth daily. Vitamin for vision **areds**     pantoprazole (PROTONIX) 40 MG tablet Take 40 mg by mouth daily.      rivaroxaban (XARELTO) 20 MG TABS tablet Take 1 tablet (20 mg total) by mouth daily with supper. 30 tablet 0   traZODone (DESYREL) 50 MG tablet Take 50 mg by mouth at bedtime.     vitamin B-12 (CYANOCOBALAMIN) 1000 MCG tablet Take 1,000 mcg by mouth daily.     baclofen (LIORESAL) 10 MG tablet TAKE 1 TABLET BY MOUTH AT  BEDTIME (Patient not taking: No sig reported) 90 tablet 1   gabapentin (NEURONTIN) 100 MG capsule Take 100-200 mg by mouth See admin instructions. Taking 100mg  in the AM and 2 capsules (200mg ) at 1200, then 200mg  at night. (Patient not taking: No sig reported)  1   No facility-administered medications prior to visit.    Review of  Systems  Constitutional:  Negative for chills, diaphoresis, fever, malaise/fatigue and weight loss.  HENT:  Negative for congestion.   Respiratory:  Positive for shortness of breath. Negative for cough, hemoptysis, sputum production and wheezing.   Cardiovascular:  Negative for chest pain, palpitations and leg swelling.    Objective:   Vitals:   04/29/21 1157  BP: 102/60  Pulse: 78  SpO2: 96%  Weight: 193 lb 3.2 oz (87.6 kg)  Height: 5' 11.5" (1.816 m)   SpO2: 96 % O2 Device: None (Room air)  Physical Exam: General: Well-appearing, no acute distress HENT: [place], AT Eyes: EOMI, no scleral icterus Respiratory: Clear to auscultation bilaterally.  No crackles, wheezing or rales Cardiovascular: RRR, -M/R/G, no JVD Extremities:-Edema,-tenderness Neuro: AAO x4, right sided facial weakness Psych: Normal mood, normal affect  Data Reviewed:  Imaging: CT Chest Lung Cancer Screen 04/03/17 - Bilateral lower lobe subcentimeter nodules CT CAP (Chest reviewed) 12/04/20 - Bilateral calcifed pleural plaques. Apical predominant emphysema. Lower lobe bronchial thicekning. RLL lung nodules <36mm  PFT: None on file  Labs: CBC    Component Value Date/Time   WBC 6.4 09/09/2019 0758   RBC 4.46 09/09/2019 0758   HGB 14.3 09/09/2019 1059   HCT 42.0 09/09/2019 1059   PLT 225 09/09/2019 0758   MCV 96.0 09/09/2019 0758   MCH 31.2 09/09/2019 0758   MCHC 32.5 09/09/2019 0758   RDW 13.9 09/09/2019 0758   LYMPHSABS 1.2 09/09/2019 0758   MONOABS 0.6 09/09/2019 0758   EOSABS 0.2 09/09/2019 0758   BASOSABS 0.1 09/09/2019 0758   Absolute eos 09/09/19 - 200    Assessment & Plan:   Discussion: 81 year old former smoker with hx CVA in 2001, hx TBI in 2003, hx post-op PE on xarelto, OSA not on CPAP who presents for follow-up. Improved bronchitis with PRN Duoneb symptoms. Due to his orofacial weakness, will defer PFT management and opt for nebulizers for his emphysema treatment. Discussed clinical  course and management of COPD/asthma including bronchodilator regimen and action plan for exacerbation.  Emphysema Chronic bronchitis --CONTINUE DUONEBs up to THREE times a day AS NEEDED for shortness of breath or wheezing  COPD Action Plan Increase DUONEBS to [place] for worsening shortness of breath, wheezing and cough. If you symptoms do not improve in 24-48 hours, please our office for evaluation and/or steroid taper.  Subcentimeter lung nodules, benign --Reviewed CT 11/2020  and compared to 03/2017. Unchanged/stable nodule size >2 years. --No indication for further imaging  Health Maintenance Immunization History  Administered Date(s) Administered   Influenza, Quadrivalent, Recombinant, Inj, Pf 12/30/2016, 11/27/2017, 11/26/2018, 01/03/2020, 12/07/2020   Influenza-Unspecified 12/22/2017   Tdap 10/26/2014, 09/10/2020   Zoster Recombinat (Shingrix) 10/04/2018   CT Lung Screen - not qualified. >80 years age  No orders of the defined types were placed in this encounter.  No orders of the defined types were placed in this encounter.  Return in about 6 months (around 10/27/2021).  I have spent a total time of 35-minutes on the day of the appointment reviewing prior documentation, coordinating care and discussing medical diagnosis and plan with the patient/family. Past medical history, allergies, medications were reviewed. Pertinent imaging, labs and tests included in this note have been reviewed and interpreted independently by me.  [place], MD [facility] 04/29/2021 12:09 PM  Office Number [phone]

## 2021-05-20 DIAGNOSIS — E7800 Pure hypercholesterolemia, unspecified: Secondary | ICD-10-CM | POA: Diagnosis not present

## 2021-05-20 DIAGNOSIS — I10 Essential (primary) hypertension: Secondary | ICD-10-CM | POA: Diagnosis not present

## 2021-05-23 DIAGNOSIS — Z0000 Encounter for general adult medical examination without abnormal findings: Secondary | ICD-10-CM | POA: Diagnosis not present

## 2021-05-29 DIAGNOSIS — L570 Actinic keratosis: Secondary | ICD-10-CM | POA: Diagnosis not present

## 2021-05-29 DIAGNOSIS — X32XXXD Exposure to sunlight, subsequent encounter: Secondary | ICD-10-CM | POA: Diagnosis not present

## 2021-05-29 DIAGNOSIS — C44319 Basal cell carcinoma of skin of other parts of face: Secondary | ICD-10-CM | POA: Diagnosis not present

## 2021-05-29 DIAGNOSIS — C44222 Squamous cell carcinoma of skin of right ear and external auricular canal: Secondary | ICD-10-CM | POA: Diagnosis not present

## 2021-06-06 DIAGNOSIS — H40013 Open angle with borderline findings, low risk, bilateral: Secondary | ICD-10-CM | POA: Diagnosis not present

## 2021-06-14 DIAGNOSIS — R3915 Urgency of urination: Secondary | ICD-10-CM | POA: Diagnosis not present

## 2021-06-14 DIAGNOSIS — R350 Frequency of micturition: Secondary | ICD-10-CM | POA: Diagnosis not present

## 2021-06-21 DIAGNOSIS — R350 Frequency of micturition: Secondary | ICD-10-CM | POA: Diagnosis not present

## 2021-06-27 DIAGNOSIS — R350 Frequency of micturition: Secondary | ICD-10-CM | POA: Diagnosis not present

## 2021-07-03 DIAGNOSIS — L570 Actinic keratosis: Secondary | ICD-10-CM | POA: Diagnosis not present

## 2021-07-03 DIAGNOSIS — H9202 Otalgia, left ear: Secondary | ICD-10-CM | POA: Diagnosis not present

## 2021-07-03 DIAGNOSIS — C44319 Basal cell carcinoma of skin of other parts of face: Secondary | ICD-10-CM | POA: Diagnosis not present

## 2021-07-03 DIAGNOSIS — Z08 Encounter for follow-up examination after completed treatment for malignant neoplasm: Secondary | ICD-10-CM | POA: Diagnosis not present

## 2021-07-03 DIAGNOSIS — X32XXXD Exposure to sunlight, subsequent encounter: Secondary | ICD-10-CM | POA: Diagnosis not present

## 2021-07-03 DIAGNOSIS — Z85828 Personal history of other malignant neoplasm of skin: Secondary | ICD-10-CM | POA: Diagnosis not present

## 2021-07-03 DIAGNOSIS — D0461 Carcinoma in situ of skin of right upper limb, including shoulder: Secondary | ICD-10-CM | POA: Diagnosis not present

## 2021-07-03 DIAGNOSIS — H66002 Acute suppurative otitis media without spontaneous rupture of ear drum, left ear: Secondary | ICD-10-CM | POA: Diagnosis not present

## 2021-07-12 DIAGNOSIS — R350 Frequency of micturition: Secondary | ICD-10-CM | POA: Diagnosis not present

## 2021-07-19 DIAGNOSIS — R350 Frequency of micturition: Secondary | ICD-10-CM | POA: Diagnosis not present

## 2021-07-26 DIAGNOSIS — R350 Frequency of micturition: Secondary | ICD-10-CM | POA: Diagnosis not present

## 2021-07-26 DIAGNOSIS — R3915 Urgency of urination: Secondary | ICD-10-CM | POA: Diagnosis not present

## 2021-08-02 DIAGNOSIS — R350 Frequency of micturition: Secondary | ICD-10-CM | POA: Diagnosis not present

## 2021-08-09 DIAGNOSIS — R350 Frequency of micturition: Secondary | ICD-10-CM | POA: Diagnosis not present

## 2021-08-14 DIAGNOSIS — Z08 Encounter for follow-up examination after completed treatment for malignant neoplasm: Secondary | ICD-10-CM | POA: Diagnosis not present

## 2021-08-14 DIAGNOSIS — Z85828 Personal history of other malignant neoplasm of skin: Secondary | ICD-10-CM | POA: Diagnosis not present

## 2021-08-14 DIAGNOSIS — L570 Actinic keratosis: Secondary | ICD-10-CM | POA: Diagnosis not present

## 2021-08-14 DIAGNOSIS — X32XXXD Exposure to sunlight, subsequent encounter: Secondary | ICD-10-CM | POA: Diagnosis not present

## 2021-08-16 DIAGNOSIS — R350 Frequency of micturition: Secondary | ICD-10-CM | POA: Diagnosis not present

## 2021-08-23 DIAGNOSIS — R350 Frequency of micturition: Secondary | ICD-10-CM | POA: Diagnosis not present

## 2021-08-30 DIAGNOSIS — R350 Frequency of micturition: Secondary | ICD-10-CM | POA: Diagnosis not present

## 2021-09-06 DIAGNOSIS — R3915 Urgency of urination: Secondary | ICD-10-CM | POA: Diagnosis not present

## 2021-09-06 DIAGNOSIS — R350 Frequency of micturition: Secondary | ICD-10-CM | POA: Diagnosis not present

## 2021-09-09 ENCOUNTER — Ambulatory Visit (INDEPENDENT_AMBULATORY_CARE_PROVIDER_SITE_OTHER): Payer: Medicare Other

## 2021-09-09 ENCOUNTER — Ambulatory Visit: Payer: Self-pay

## 2021-09-09 ENCOUNTER — Ambulatory Visit: Payer: Medicare Other | Admitting: Family Medicine

## 2021-09-09 VITALS — BP 126/58 | HR 75 | Ht 71.5 in

## 2021-09-09 DIAGNOSIS — M25551 Pain in right hip: Secondary | ICD-10-CM

## 2021-09-09 DIAGNOSIS — M545 Low back pain, unspecified: Secondary | ICD-10-CM | POA: Diagnosis not present

## 2021-09-09 DIAGNOSIS — G8929 Other chronic pain: Secondary | ICD-10-CM | POA: Diagnosis not present

## 2021-09-09 DIAGNOSIS — M25512 Pain in left shoulder: Secondary | ICD-10-CM | POA: Diagnosis not present

## 2021-09-09 DIAGNOSIS — R479 Unspecified speech disturbances: Secondary | ICD-10-CM

## 2021-09-09 NOTE — Progress Notes (Signed)
I, Steven Guerrero, LAT, ATC acting as a scribe for Steven Leader, MD.  Steven Guerrero is a 81 y.o. male who presents to [place] at [facility] today for continued L shoulder pain due a traumatic RC tear/GH DJD and new hip pain. Pt was last seen by Dr. Georgina Guerrero on 02/25/21 and was given a L GH steroid injection and advised to cont home health PT and HEP. Pt reports prior steroid injection was very beneficial and last till probably March.  Pt suffered a fall on 6/10 injuring his R hip. Pt was trying to get from the garage to the house and fell trying to reach for the door, falling backwards and also hit his head. Pt didn't c/o R hip pain until Sunday morning. Pt had a THR in 2005. Pt locates pain to the posterior aspect of the R hip.   His wife notes that his behavior has not changed since his fall.  He does not think he has any new weakness.  Radiates: no LE numbness/tingling: no LE weakness: yes Aggravates: walking, transitioning to stand Treatments tried: lidocaine patch  Dx imaging: 11/30/20 L shoulder XR  Pertinent review of systems: No fevers or chills  Relevant historical information: History of stroke.  COPD.  History of pulmonary embolism.  History of right hip replacement.   Exam:  BP (!) 126/58   Pulse 75   Ht 5' 11.5" (1.816 m)   SpO2 93%   BMI 26.57 kg/m  General: Well Developed, well nourished, and in no acute distress.   MSK: L-spine: Tender palpation midline lower portion of lumbar spine. Tender palpation right lumbar paraspinal musculature and posterior iliac crest right side. Decreased lumbar motion. Weak gait using a walker to ambulate.  Left shoulder: Nontender.  Decreased range of motion significantly limited in abduction and external rotation and functional internal rotation. Strength reduced external rotation and abduction.  Neuro: Alert and oriented.  Significantly impaired speech with significant weakness upper and lower extremities.  Slow  impaired gait using a walker to ambulate.  Lab and Radiology Results  Procedure: Real-time Ultrasound Guided Injection of left shoulder glenohumeral joint posterior approach Device: Philips Affiniti 50G Images permanently stored and available for review in PACS Verbal informed consent obtained.  Discussed risks and benefits of procedure. Warned about infection, bleeding, hyperglycemia damage to structures among others. Patient expresses understanding and agreement Time-out conducted.   Noted no overlying erythema, induration, or other signs of local infection.   Skin prepped in a sterile fashion.   Local anesthesia: Topical Ethyl chloride.   With sterile technique and under real time ultrasound guidance: 40 mg of Kenalog and 2 mL of Marcaine injected into glenohumeral joint. Fluid seen entering the joint capsule.   Completed without difficulty   Pain immediately resolved suggesting accurate placement of the medication.   Advised to call if fevers/chills, erythema, induration, drainage, or persistent bleeding.   Images permanently stored and available for review in the ultrasound unit.  Impression: Technically successful ultrasound guided injection.    X-ray images L-spine and right hip obtained today personally and independently interpreted  L-spine: Diffuse degenerative changes.  No acute vertebral compression fracture is visible per my interpretation. Abdominal aortic atherosclerosis present.  Right hip: Right total hip replacement with longstem and circular wires surrounding the stem and the femur proximally. No acute fractures are present.  Await formal radiology review    Assessment and Plan: 81 y.o. male with chronic left shoulder pain.  Patient has significant  glenohumeral DJD with chronic rotator cuff tear.  He is a poor surgical candidate so we are managing his shoulder pain conservatively.  Plan for repeat steroid injection today.  He did fall recently.  I do not think  he suffered any fractures based on the x-ray today.  A bit of watchful waiting should be helpful.  He did hit his head and is on a blood thinner.  However this fall occurred 9 days ago.  His neurologic state has not changed since the fall.  Ideally his head should have been imaged with a CT scan on the day of the fall but he did not seek health care then.  I discussed that he probably would benefit from a CT scan however at this point if he does not want to proceed with a CT scan of his head and reasonable to not have 1.  Watchful waiting.  Precautions reviewed.    PDMP not reviewed this encounter. Orders Placed This Encounter  Procedures   DG HIP UNILAT W OR W/O PELVIS 2-3 VIEWS RIGHT    Standing Status:   Future    Number of Occurrences:   1    Standing Expiration Date:   10/09/2021    Order Specific Question:   Reason for Exam (SYMPTOM  OR DIAGNOSIS REQUIRED)    Answer:   right hip pain    Order Specific Question:   Preferred imaging location?    Answer:   [facility]   DG Lumbar Spine 2-3 Views    Standing Status:   Future    Number of Occurrences:   1    Standing Expiration Date:   10/09/2021    Order Specific Question:   Reason for Exam (SYMPTOM  OR DIAGNOSIS REQUIRED)    Answer:   low back pain    Order Specific Question:   Preferred imaging location?    Answer:   [facility]   [place] LIMITED JOINT SPACE STRUCTURES UP LEFT(NO LINKED CHARGES)    Order Specific Question:   Reason for Exam (SYMPTOM  OR DIAGNOSIS REQUIRED)    Answer:   left shoulder pain    Order Specific Question:   Preferred imaging location?    Answer:   [place]   No orders of the defined types were placed in this encounter.    Discussed warning signs or symptoms. Please see discharge instructions. Patient expresses understanding.   The above documentation has been reviewed and is accurate and complete Steven Guerrero, M.D.

## 2021-09-09 NOTE — Patient Instructions (Addendum)
Thank you for coming in today.   Please get an Xray today before you leave   You received a steroid injection in your left shoulder today. Seek immediate medical attention if the joint becomes red, extremely painful, or is oozing fluid.   Check back based on the x-rays as needed

## 2021-09-10 ENCOUNTER — Encounter: Payer: Self-pay | Admitting: Family Medicine

## 2021-09-10 NOTE — Progress Notes (Signed)
Right hip x-ray shows well-appearing surgical hardware of the right hip.  No fracture is visible.

## 2021-09-10 NOTE — Progress Notes (Signed)
Lumbar spine x-ray shows arthritis changes.  No fractures are visible.  You do have what appears to be a dilated abdominal aorta that looks a little bigger than we previously saw it on a CT scan.  I will make sure Dr. Shelia Media is aware of this and we can look at it with higher detail in the future if needed.

## 2021-10-08 ENCOUNTER — Encounter: Payer: Self-pay | Admitting: Cardiovascular Disease

## 2021-10-08 ENCOUNTER — Ambulatory Visit: Payer: Medicare Other | Admitting: Cardiovascular Disease

## 2021-10-08 VITALS — BP 136/60 | HR 67 | Ht 71.5 in | Wt 190.0 lb

## 2021-10-08 DIAGNOSIS — J449 Chronic obstructive pulmonary disease, unspecified: Secondary | ICD-10-CM

## 2021-10-08 DIAGNOSIS — J4489 Other specified chronic obstructive pulmonary disease: Secondary | ICD-10-CM

## 2021-10-08 DIAGNOSIS — I10 Essential (primary) hypertension: Secondary | ICD-10-CM | POA: Diagnosis not present

## 2021-10-08 DIAGNOSIS — Z8673 Personal history of transient ischemic attack (TIA), and cerebral infarction without residual deficits: Secondary | ICD-10-CM | POA: Diagnosis not present

## 2021-10-08 DIAGNOSIS — E785 Hyperlipidemia, unspecified: Secondary | ICD-10-CM

## 2021-10-08 DIAGNOSIS — I714 Abdominal aortic aneurysm, without rupture, unspecified: Secondary | ICD-10-CM

## 2021-10-08 DIAGNOSIS — N1832 Chronic kidney disease, stage 3b: Secondary | ICD-10-CM

## 2021-10-08 NOTE — Patient Instructions (Signed)
Medication Instructions:  Your physician recommends that you continue on your current medications as directed. Please refer to the Current Medication list given to you today.  *If you need a refill on your cardiac medications before your next appointment, please call your pharmacy*   Testing/Procedures: Your physician has requested that you have an echocardiogram. Echocardiography is a painless test that uses sound waves to create images of your heart. It provides your doctor with information about the size and shape of your heart and how well your heart's chambers and valves are working. This procedure takes approximately one hour. There are no restrictions for this procedure. To be done in January 2024. This procedure will be done at [address] has requested that you have an abdominal aorta duplex. During this test, an ultrasound is used to evaluate the aorta. Allow 30 minutes for this exam. Do not eat after midnight the day before and avoid carbonated beverages To be done in January 2024. This procedure will be done at [address]    Follow-Up: At [facility], you and your health needs are our priority.  As part of our continuing mission to provide you with exceptional heart care, we have created designated Provider Care Teams.  These Care Teams include your primary Cardiologist (physician) and Advanced Practice Providers (APPs -  Physician Assistants and Nurse Practitioners) who all work together to provide you with the care you need, when you need it.  We recommend signing up for the patient portal called "MyChart".  Sign up information is provided on this After Visit Summary.  MyChart is used to connect with patients for Virtual Visits (Telemedicine).  Patients are able to view lab/test results, encounter notes, upcoming appointments, etc.  Non-urgent messages can be sent to your provider as well.   To learn more about what you can do with  MyChart, go to [email or web address].    Your next appointment:   6 month(s)  The format for your next appointment:   In Person  Provider:   Shelva Majestic, MD
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
## 2021-10-15 ENCOUNTER — Encounter: Payer: Self-pay | Admitting: Cardiovascular Disease

## 2021-10-28 ENCOUNTER — Encounter: Payer: Self-pay | Admitting: Pulmonary Disease

## 2021-10-28 ENCOUNTER — Ambulatory Visit: Payer: Medicare Other | Admitting: Pulmonary Disease

## 2021-10-28 VITALS — BP 130/78 | HR 91 | Temp 97.6°F | Ht 71.5 in | Wt 194.2 lb

## 2021-10-28 DIAGNOSIS — J449 Chronic obstructive pulmonary disease, unspecified: Secondary | ICD-10-CM

## 2021-10-28 NOTE — Progress Notes (Signed)
Subjective:   [name] GENDER: male DOB: [birth year], MRN: [id number]   HPI  Chief Complaint  Patient presents with   Follow-up    Pt states he has been doing okay since last visit and denies any complaints.    Reason for Visit: COPD follow-up  Steven Guerrero is a 81 year old male former smoker with hx CVA in 2001, hx TBI in 2003, hx post-op PE on xarelto, OSA not on CPAP who presents for follow-up  Synopsis: 2022 - Established care in Oct. Bronchitis symptoms with nocturnal awakening of wheezing and productive cough.  He was diagnosed with OSA >10 years ago. Does not like to wear CPAP due to facial weakness. Was started on nebulizer treatment for COPD  04/29/21 Since our last visit he has been taking Duonebs once a day with a day. Some shortness of breath. Denies coughing and wheezing, which is improved. No longer having nocturnal symptoms. No exacerbations since our last visit.  10/28/21 Family present. Since our last visit he has not required Duonebs. Denies shortness of breath, cough or wheezing. Not active at baseline but when he is he uses his a walker. No ED/urgent care visits since our last visit.  Social History: Quit in 2008. Smoked 1ppd x 40 years  Environmental exposures: Asbestosis during insulation for 5-6 years.  Past Medical History:  Diagnosis Date   Anxiety and depression    Back pain    Brain bleed ([place])    2003   CVA (cerebral infarction)    2001   Hypercholesteremia    Hypertension    Peripheral neuropathy    Pulmonary embolism (HCC)    Sleep apnea    Stroke ([place])    2001   TBI (traumatic brain injury) ([place])      Family History  Problem Relation Age of Onset   Heart attack Father        Mother - health hx unknown     Social History   Occupational History   Occupation: Retired  Tobacco Use   Smoking status: Former    Packs/day: 1.50    Years: 47.00    Total pack years: 70.50    Types: Cigarettes    Quit date: 03/24/2006     Years since quitting: 15.6   Smokeless tobacco: Never  Vaping Use   Vaping Use: Never used  Substance and Sexual Activity   Alcohol use: Yes    Alcohol/week: 0.0 standard drinks of alcohol    Comment: 1 beer per day   Drug use: No   Sexual activity: Not on file    Allergies  Allergen Reactions   Atenolol Other (See Comments)    Does not remember reaction   Lotensin [Benazepril Hcl] Other (See Comments)    Does not remember reaction   Wellbutrin [Bupropion] Other (See Comments)    Does not remember reaction     Outpatient Medications Prior to Visit  Medication Sig Dispense Refill   amLODipine (NORVASC) 10 MG tablet Take 10 mg by mouth daily.     atorvastatin (LIPITOR) 20 MG tablet Take 20 mg by mouth daily.      citalopram (CELEXA) 40 MG tablet Take 40 mg by mouth at bedtime.      Evolocumab (REPATHA) 140 MG/ML SOSY Inject 140 mg into the skin every 14 (fourteen) days. Tuesday     Ferrous Gluconate-C-Folic Acid (IRON-C PO) Take 1 tablet by mouth at bedtime.     fish oil-omega-3 fatty  acids 1000 MG capsule Take 1 g by mouth daily.     ipratropium-albuterol (DUONEB) 0.5-2.5 (3) MG/3ML SOLN Take 3 mLs by nebulization every 6 (six) hours. 360 mL 11   losartan (COZAAR) 100 MG tablet Take 100 mg by mouth daily.     mirtazapine (REMERON) 15 MG tablet Take 15 mg by mouth daily.     Multiple Vitamin (MULTIVITAMIN WITH MINERALS) TABS tablet Take 1 tablet by mouth daily.     pantoprazole (PROTONIX) 40 MG tablet Take 40 mg by mouth daily.      rivaroxaban (XARELTO) 20 MG TABS tablet Take 1 tablet (20 mg total) by mouth daily with supper. (Patient taking differently: Take 10 mg by mouth daily with supper.) 30 tablet 0   vitamin B-12 (CYANOCOBALAMIN) 1000 MCG tablet Take 1,000 mcg by mouth daily.     No facility-administered medications prior to visit.    Review of Systems  Constitutional:  Negative for chills, diaphoresis, fever, malaise/fatigue and weight loss.  HENT:  Negative for  congestion.   Respiratory:  Negative for cough, hemoptysis, sputum production, shortness of breath and wheezing.   Cardiovascular:  Negative for chest pain, palpitations and leg swelling.     Objective:   Vitals:   10/28/21 1413  BP: 130/78  Pulse: 91  Temp: 97.6 F (36.4 C)  TempSrc: Oral  SpO2: 95%  Weight: 194 lb 3.2 oz (88.1 kg)  Height: 5' 11.5" (1.816 m)   SpO2: 95 % (RA) O2 Device: None (Room air)  Physical Exam: General: Chronically ill-appearing, no acute distress HENT: [place], AT Eyes: EOMI, no scleral icterus Respiratory: Clear to auscultation bilaterally.  No crackles, wheezing or rales Cardiovascular: RRR, -M/R/G, no JVD Extremities:-Edema,-tenderness Neuro: AAO x4, Right sided facial weakness Psych: Normal mood, normal affect  Data Reviewed:  Imaging: CT Chest Lung Cancer Screen 04/03/17 - Bilateral lower lobe subcentimeter nodules CT CAP (Chest reviewed) 12/04/20 - Bilateral calcifed pleural plaques. Apical predominant emphysema. Lower lobe bronchial thicekning. RLL lung nodules <58m  PFT: None on file  Labs: CBC    Component Value Date/Time   WBC 6.4 09/09/2019 0758   RBC 4.46 09/09/2019 0758   HGB 14.3 09/09/2019 1059   HCT 42.0 09/09/2019 1059   PLT 225 09/09/2019 0758   MCV 96.0 09/09/2019 0758   MCH 31.2 09/09/2019 0758   MCHC 32.5 09/09/2019 0758   RDW 13.9 09/09/2019 0758   LYMPHSABS 1.2 09/09/2019 0758   MONOABS 0.6 09/09/2019 0758   EOSABS 0.2 09/09/2019 0758   BASOSABS 0.1 09/09/2019 0758   Absolute eos 09/09/19 - 200    Assessment & Plan:   Discussion: 81year old former smoker with hx CVA in 2001, hx TBI in 2003, hx post-op PE on Xarelto, OSA not on CPAP who presents for follow-up. Overall symptoms well controlled however not ambulatory at baseline. Due to his orofacial weakness, will defer PFT management and opt for nebulizers for his emphysema treatment. Discussed clinical course and management of COPD including bronchodilator  regimen and action plan for exacerbation.  Emphysema Chronic bronchitis --CONTINUE DUONEBs up to THREE times a day AS NEEDED for shortness of breath or wheezing --Discussed daily deep breathing exercises  COPD Action Plan Increase DUONEBS to [place] worsening shortness of breath, wheezing and cough. If you symptoms do not improve in 24-48 hours, please our office for evaluation and/or steroid taper.  Subcentimeter lung nodules, benign --Reviewed CT 11/2020 and compared to 03/2017. Unchanged/stable nodule size >2 years. --No indication for  further imaging  Health Maintenance Immunization History  Administered Date(s) Administered   Influenza, Quadrivalent, Recombinant, Inj, Pf 12/30/2016, 11/27/2017, 11/26/2018, 01/03/2020, 12/07/2020   Influenza-Unspecified 12/22/2017   Pneumococcal Conjugate-13 02/22/2014   Pneumococcal Polysaccharide-23 03/09/2001, 09/07/2007   Tdap 10/26/2014, 09/10/2020   Zoster Recombinat (Shingrix) 10/04/2018, 12/22/2018   CT Lung Screen - not qualified. >80 years age  No orders of the defined types were placed in this encounter.  No orders of the defined types were placed in this encounter.  Return in about 6 months (around 04/30/2022).  I have spent a total time of 20-minutes on the day of the appointment including chart review, data review, collecting history, coordinating care and discussing medical diagnosis and plan with the patient/family. Past medical history, allergies, medications were reviewed. Pertinent imaging, labs and tests included in this note have been reviewed and interpreted independently by me.  [place], MD [facility] 10/28/2021 2:27 PM  Office Number [phone]

## 2021-10-28 NOTE — Patient Instructions (Signed)
Emphysema Chronic bronchitis --CONTINUE DUONEBs up to THREE times a day AS NEEDED for shortness of breath or wheezing --Discussed daily deep breathing exercises  COPD Action Plan Increase DUONEBS to [place] for worsening shortness of breath, wheezing and cough. If you symptoms do not improve in 24-48 hours, please our office for evaluation and/or steroid taper.  Follow-up with me in 6 months

## 2021-10-31 DIAGNOSIS — R634 Abnormal weight loss: Secondary | ICD-10-CM | POA: Diagnosis not present

## 2021-11-04 DIAGNOSIS — R197 Diarrhea, unspecified: Secondary | ICD-10-CM | POA: Diagnosis not present

## 2021-11-20 DIAGNOSIS — R0602 Shortness of breath: Secondary | ICD-10-CM | POA: Diagnosis not present

## 2021-11-20 DIAGNOSIS — I739 Peripheral vascular disease, unspecified: Secondary | ICD-10-CM | POA: Diagnosis not present

## 2021-11-20 DIAGNOSIS — R600 Localized edema: Secondary | ICD-10-CM | POA: Diagnosis not present

## 2021-11-20 DIAGNOSIS — Z86711 Personal history of pulmonary embolism: Secondary | ICD-10-CM | POA: Diagnosis not present

## 2021-11-22 DIAGNOSIS — Z86711 Personal history of pulmonary embolism: Secondary | ICD-10-CM | POA: Diagnosis not present

## 2021-11-22 DIAGNOSIS — R600 Localized edema: Secondary | ICD-10-CM | POA: Diagnosis not present

## 2021-11-22 DIAGNOSIS — I739 Peripheral vascular disease, unspecified: Secondary | ICD-10-CM | POA: Diagnosis not present

## 2021-11-22 DIAGNOSIS — R0602 Shortness of breath: Secondary | ICD-10-CM | POA: Diagnosis not present

## 2021-12-04 DIAGNOSIS — X32XXXD Exposure to sunlight, subsequent encounter: Secondary | ICD-10-CM | POA: Diagnosis not present

## 2021-12-04 DIAGNOSIS — L570 Actinic keratosis: Secondary | ICD-10-CM | POA: Diagnosis not present

## 2021-12-16 DIAGNOSIS — H60502 Unspecified acute noninfective otitis externa, left ear: Secondary | ICD-10-CM | POA: Diagnosis not present

## 2021-12-16 DIAGNOSIS — H6012 Cellulitis of left external ear: Secondary | ICD-10-CM | POA: Diagnosis not present

## 2021-12-18 ENCOUNTER — Ambulatory Visit: Payer: Self-pay

## 2021-12-18 ENCOUNTER — Ambulatory Visit: Payer: Medicare Other | Admitting: Family Medicine

## 2021-12-18 VITALS — BP 158/78 | HR 82 | Ht 71.5 in | Wt 192.0 lb

## 2021-12-18 DIAGNOSIS — M25512 Pain in left shoulder: Secondary | ICD-10-CM | POA: Diagnosis not present

## 2021-12-18 DIAGNOSIS — G8929 Other chronic pain: Secondary | ICD-10-CM | POA: Diagnosis not present

## 2021-12-18 NOTE — Patient Instructions (Signed)
Thank you for coming in today.   You received an injection today. Seek immediate medical attention if the joint becomes red, extremely painful, or is oozing fluid.  

## 2021-12-18 NOTE — Progress Notes (Signed)
   I, [name], LAT, ATC acting as a scribe for [name], MD.  Steven Guerrero is a 81 y.o. male who presents to [place] at [facility] today for cont'd L shoulder pain due a traumatic RC tear/GH DJD. Pt was last seen by Dr. Georgina Snell on 09/09/21 and was given a L GH steroid injection. Today, pt reports L shoulder is hurting again over the past few week. Prior steroid injection provided much relief.    Dx imaging: 11/30/20 L shoulder XR  Pertinent review of systems: No fevers or chills  Relevant historical information: COPD.  History of stroke.  Using a walker to ambulate.   Exam:  BP (!) 158/78   Pulse 82   Ht 5' 11.5" (1.816 m)   Wt 192 lb (87.1 kg)   SpO2 96%   BMI 26.41 kg/m  General: Well Developed, well nourished, and in no acute distress.   MSK: Left shoulder: Decreased muscle bulk.  Decreased range of motion.  Nontender.    Lab and Radiology Results  Procedure: Real-time Ultrasound Guided Injection of left shoulder glenohumeral joint posterior approach Device: Philips Affiniti 50G Images permanently stored and available for review in PACS Verbal informed consent obtained.  Discussed risks and benefits of procedure. Warned about infection, bleeding, hyperglycemia damage to structures among others. Patient expresses understanding and agreement Time-out conducted.   Noted no overlying erythema, induration, or other signs of local infection.   Skin prepped in a sterile fashion.   Local anesthesia: Topical Ethyl chloride.   With sterile technique and under real time ultrasound guidance: 40 mg of Kenalog and 2 mL of Marcaine injected into shoulder joint. Fluid seen entering the joint capsule.   Completed without difficulty   Pain immediately resolved suggesting accurate placement of the medication.   Advised to call if fevers/chills, erythema, induration, drainage, or persistent bleeding.   Images permanently stored and available for review in the  ultrasound unit.  Impression: Technically successful ultrasound guided injection.        Assessment and Plan: 81 y.o. male with left shoulder pain thought to be due to glenohumeral DJD and chronic rotator cuff tear.  He is a poor surgical candidate due to his other health conditions.  Plan for repeat steroid injection today.  Check back in about 3 months.  Anticipate likely need for repeat steroid injection at that time.   PDMP not reviewed this encounter. Orders Placed This Encounter  Procedures   [place] LIMITED JOINT SPACE STRUCTURES UP LEFT(NO LINKED CHARGES)    Order Specific Question:   Reason for Exam (SYMPTOM  OR DIAGNOSIS REQUIRED)    Answer:   left shoulder pain    Order Specific Question:   Preferred imaging location?    Answer:   [place]   No orders of the defined types were placed in this encounter.    Discussed warning signs or symptoms. Please see discharge instructions. Patient expresses understanding.   The above documentation has been reviewed and is accurate and complete [name], M.D.

## 2021-12-20 DIAGNOSIS — Z23 Encounter for immunization: Secondary | ICD-10-CM | POA: Diagnosis not present

## 2022-01-31 DIAGNOSIS — R197 Diarrhea, unspecified: Secondary | ICD-10-CM | POA: Diagnosis not present

## 2022-03-04 NOTE — Progress Notes (Unsigned)
   I, [name], LAT, ATC acting as a scribe for [name], MD.  Steven Guerrero is a 81 y.o. male who presents to [address] at [facility] today for 77-monthfollow-up of chronic left shoulder pain due a traumatic RC tear/GH DJD.   Patient was last seen by Dr. CGeorgina Snellon 12/18/2021 and was given a left glenohumeral steroid injection.  Today, patient reports  Dx imaging: 11/30/20 L shoulder XR   Pertinent review of systems: ***  Relevant historical information: ***   Exam:  There were no vitals taken for this visit. General: Well Developed, well nourished, and in no acute distress.   MSK: ***    Lab and Radiology Results No results found for this or any previous visit (from the past 72 hour(s)). No results found.     Assessment and Plan: 81y.o. male with ***   PDMP not reviewed this encounter. No orders of the defined types were placed in this encounter.  No orders of the defined types were placed in this encounter.    Discussed warning signs or symptoms. Please see discharge instructions. Patient expresses understanding.   ***

## 2022-03-05 ENCOUNTER — Ambulatory Visit: Payer: Medicare Other | Admitting: Family Medicine

## 2022-03-05 ENCOUNTER — Ambulatory Visit: Payer: Self-pay

## 2022-03-05 VITALS — BP 164/68 | HR 72 | Ht 71.5 in | Wt 191.0 lb

## 2022-03-05 DIAGNOSIS — M25512 Pain in left shoulder: Secondary | ICD-10-CM | POA: Diagnosis not present

## 2022-03-05 DIAGNOSIS — G8929 Other chronic pain: Secondary | ICD-10-CM | POA: Diagnosis not present

## 2022-03-05 NOTE — Patient Instructions (Addendum)
Thank you for coming in today.   You received an injection today. Seek immediate medical attention if the joint becomes red, extremely painful, or is oozing fluid.   Return in about 3 months.

## 2022-04-08 ENCOUNTER — Ambulatory Visit (HOSPITAL_COMMUNITY): Payer: Medicare Other | Attending: Cardiology

## 2022-04-08 DIAGNOSIS — E785 Hyperlipidemia, unspecified: Secondary | ICD-10-CM

## 2022-04-08 DIAGNOSIS — I10 Essential (primary) hypertension: Secondary | ICD-10-CM

## 2022-04-08 DIAGNOSIS — I714 Abdominal aortic aneurysm, without rupture, unspecified: Secondary | ICD-10-CM | POA: Diagnosis not present

## 2022-04-08 LAB — ECHOCARDIOGRAM COMPLETE
Area-P 1/2: 2.78 cm2
P 1/2 time: 428 msec
S' Lateral: 2.6 cm

## 2022-04-17 ENCOUNTER — Ambulatory Visit (HOSPITAL_COMMUNITY)
Admission: RE | Admit: 2022-04-17 | Discharge: 2022-04-17 | Disposition: A | Discharge status: Home / Self Care | Payer: Medicare Other | Source: Ambulatory Visit | Attending: Internal Medicine | Admitting: Internal Medicine

## 2022-04-17 DIAGNOSIS — I7143 Infrarenal abdominal aortic aneurysm, without rupture: Secondary | ICD-10-CM | POA: Insufficient documentation

## 2022-04-17 DIAGNOSIS — E785 Hyperlipidemia, unspecified: Secondary | ICD-10-CM | POA: Diagnosis not present

## 2022-04-17 DIAGNOSIS — I10 Essential (primary) hypertension: Secondary | ICD-10-CM

## 2022-04-17 DIAGNOSIS — I714 Abdominal aortic aneurysm, without rupture, unspecified: Secondary | ICD-10-CM

## 2022-04-21 ENCOUNTER — Other Ambulatory Visit: Payer: Self-pay

## 2022-04-21 DIAGNOSIS — I714 Abdominal aortic aneurysm, without rupture, unspecified: Secondary | ICD-10-CM

## 2022-04-30 ENCOUNTER — Ambulatory Visit (INDEPENDENT_AMBULATORY_CARE_PROVIDER_SITE_OTHER): Payer: Medicare Other | Admitting: Pulmonary Disease

## 2022-04-30 ENCOUNTER — Encounter (HOSPITAL_BASED_OUTPATIENT_CLINIC_OR_DEPARTMENT_OTHER): Payer: Self-pay | Admitting: Pulmonary Disease

## 2022-04-30 VITALS — BP 130/68 | HR 68 | Ht 71.5 in | Wt 192.0 lb

## 2022-04-30 DIAGNOSIS — J432 Centrilobular emphysema: Secondary | ICD-10-CM | POA: Diagnosis not present

## 2022-04-30 NOTE — Progress Notes (Signed)
Subjective:   [name] GENDER: male DOB: 1940-09-07, MRN: [id number]   HPI  Chief Complaint  Patient presents with   Follow-up    No concerns    Reason for Visit: COPD follow-up  [name] is a 82 year old male former smoker with hx CVA in 2001, hx TBI in 2003, hx post-op PE on xarelto, OSA not on CPAP who presents for follow-up  Synopsis: 2022 - Established care in Oct. Bronchitis symptoms with nocturnal awakening of wheezing and productive cough.  He was diagnosed with OSA >10 years ago. Does not like to wear CPAP due to facial weakness. Was started on nebulizer treatment for COPD 2023 - Previously on daily Duonebs as needed. Mainly shortness of breath. No exacerbations this year  04/29/21 Since our last visit he has been taking Duonebs once a day with a day. Some shortness of breath. Denies coughing and wheezing, which is improved. No longer having nocturnal symptoms. No exacerbations since our last visit.  10/28/21 Family present. Since our last visit he has not required Duonebs. Denies shortness of breath, cough or wheezing. Not active at baseline but when he is he uses his a walker. No ED/urgent care visits since our last visit.  04/30/22 Presents with wife. Since our last visit he reports overall well controlled with a few colds but did not need nebulizer or medications. Denies shortness of breath, cough or wheezing. Not active at baseline so will get winded with short distances.  Social History: Quit in 2008. Smoked 1ppd x 40 years  Environmental exposures: Asbestosis during insulation for 5-6 years.  Past Medical History:  Diagnosis Date   Anxiety and depression    Back pain    Brain bleed ([place])    2003   CVA (cerebral infarction)    2001   Hypercholesteremia    Hypertension    Peripheral neuropathy    Pulmonary embolism (HCC)    Sleep apnea    Stroke ([place])    2001   TBI (traumatic brain injury) ([place])      Family History  Problem  Relation Age of Onset   Heart attack Father        Mother - health hx unknown     Social History   Occupational History   Occupation: Retired  Tobacco Use   Smoking status: Former    Packs/day: 1.50    Years: 47.00    Total pack years: 70.50    Types: Cigarettes    Quit date: 03/24/2006    Years since quitting: 16.1   Smokeless tobacco: Never  Vaping Use   Vaping Use: Never used  Substance and Sexual Activity   Alcohol use: Yes    Alcohol/week: 0.0 standard drinks of alcohol    Comment: 1 beer per day   Drug use: No   Sexual activity: Not on file    Allergies  Allergen Reactions   Atenolol Other (See Comments)    Does not remember reaction   Lotensin [Benazepril Hcl] Other (See Comments)    Does not remember reaction   Wellbutrin [Bupropion] Other (See Comments)    Does not remember reaction     Outpatient Medications Prior to Visit  Medication Sig Dispense Refill   atorvastatin (LIPITOR) 20 MG tablet Take 20 mg by mouth daily.      citalopram (CELEXA) 40 MG tablet Take 40 mg by mouth at bedtime.      Evolocumab (REPATHA) 140 MG/ML SOSY Inject 140  mg into the skin every 14 (fourteen) days. Tuesday     Ferrous Gluconate-C-Folic Acid (IRON-C PO) Take 1 tablet by mouth at bedtime.     fish oil-omega-3 fatty acids 1000 MG capsule Take 1 g by mouth daily.     ipratropium-albuterol (DUONEB) 0.5-2.5 (3) MG/3ML SOLN Take 3 mLs by nebulization every 6 (six) hours. 360 mL 11   losartan (COZAAR) 100 MG tablet Take 100 mg by mouth daily.     mirtazapine (REMERON) 15 MG tablet Take 15 mg by mouth daily.     Multiple Vitamin (MULTIVITAMIN WITH MINERALS) TABS tablet Take 1 tablet by mouth daily.     pantoprazole (PROTONIX) 40 MG tablet Take 40 mg by mouth daily.      rivaroxaban (XARELTO) 20 MG TABS tablet Take 1 tablet (20 mg total) by mouth daily with supper. (Patient taking differently: Take 10 mg by mouth daily with supper.) 30 tablet 0   vitamin B-12 (CYANOCOBALAMIN) 1000 MCG  tablet Take 1,000 mcg by mouth daily.     No facility-administered medications prior to visit.    Review of Systems  Constitutional:  Negative for chills, diaphoresis, fever, malaise/fatigue and weight loss.  HENT:  Negative for congestion.   Respiratory:  Positive for shortness of breath. Negative for cough, hemoptysis, sputum production and wheezing.   Cardiovascular:  Negative for chest pain, palpitations and leg swelling.     Objective:   Vitals:   04/30/22 0904  BP: 130/68  Pulse: 68  SpO2: 98%  Weight: 192 lb (87.1 kg)  Height: 5' 11.5" (1.816 m)   SpO2: 98 % O2 Device: None (Room air)  Physical Exam: General: Chronically ill-appearing, no acute distress HENT: [place], AT Eyes: EOMI, no scleral icterus Respiratory: Clear to auscultation bilaterally.  No crackles, wheezing or rales Cardiovascular: RRR, -M/R/G, no JVD Extremities:-Edema,-tenderness Neuro: AAO x4, CNII-XII grossly intact, right sided facial weakness Psych: Normal mood, normal affect   Data Reviewed:  Imaging: CT Chest Lung Cancer Screen 04/03/17 - Bilateral lower lobe subcentimeter nodules CT CAP (Chest reviewed) 12/04/20 - Bilateral calcifed pleural plaques. Apical predominant emphysema. Lower lobe bronchial thicekning. RLL lung nodules <19m  PFT: None on file  Labs: CBC    Component Value Date/Time   WBC 6.4 09/09/2019 0758   RBC 4.46 09/09/2019 0758   HGB 14.3 09/09/2019 1059   HCT 42.0 09/09/2019 1059   PLT 225 09/09/2019 0758   MCV 96.0 09/09/2019 0758   MCH 31.2 09/09/2019 0758   MCHC 32.5 09/09/2019 0758   RDW 13.9 09/09/2019 0758   LYMPHSABS 1.2 09/09/2019 0758   MONOABS 0.6 09/09/2019 0758   EOSABS 0.2 09/09/2019 0758   BASOSABS 0.1 09/09/2019 0758   Absolute eos 09/09/19 - 200    Assessment & Plan:   Discussion: 82year old former smoker with hx CVA in 2001, hx TBI in 2003, hx post-op PE on Xarelto, OSA not on CPAP who presents for follow-up. Non-ambulatory at baseline. Due to  orofacial weakness, will defer PFTs and opt for nebulizer treatment for management of his symptoms. Discussed clinical course and management of COPD including bronchodilator regimen, vaccinations and action plan for exacerbation.   Emphysema Chronic bronchitis --CONTINUE DUONEBS up to THREE times a day AS NEEDED for shortness of breath or wheezing --Discussed vaccinations including covid, RSV and influenza --Discussed daily deep breathing exercises  COPD Action Plan [place] to [place] breath, wheezing and cough. If you symptoms do not improve in 24-48  hours, please our office for evaluation and/or steroid taper.  Subcentimeter lung nodules, benign --Reviewed CT 11/2020 and compared to 03/2017. Unchanged/stable nodule size >2 years. --No indication for further imaging  Health Maintenance Immunization History  Administered Date(s) Administered   Influenza, High Dose Seasonal PF 12/20/2021   Influenza, Quadrivalent, Recombinant, Inj, Pf 12/30/2016, 11/27/2017, 11/26/2018, 01/03/2020, 12/07/2020   Influenza-Unspecified 12/22/2017   Pneumococcal Conjugate-13 02/22/2014   Pneumococcal Polysaccharide-23 03/09/2001, 09/07/2007   Tdap 10/26/2014, 09/10/2020   Zoster Recombinat (Shingrix) 10/04/2018, 12/22/2018   CT Lung Screen - not qualified. >80 years age  No orders of the defined types were placed in this encounter.  No orders of the defined types were placed in this encounter.  Return in about 1 year (around 05/01/2023).  I have spent a total time of 30-minutes on the day of the appointment including chart review, data review, collecting history, coordinating care and discussing medical diagnosis and plan with the patient/family. Past medical history, allergies, medications were reviewed. Pertinent imaging, labs and tests included in this note have been reviewed and interpreted independently by me.  Epes, MD [facility]  Critical [facility] 04/30/2022 9:35 AM  Office Number [phone]

## 2022-04-30 NOTE — Patient Instructions (Signed)
Emphysema Chronic bronchitis --CONTINUE DUONEBS up to THREE times a day AS NEEDED for shortness of breath or wheezing --Discussed daily deep breathing exercises  COPD Action Plan Increase DUONEBS to [place] for worsening shortness of breath, wheezing and cough. If you symptoms do not improve in 24-48 hours, please our office for evaluation and/or steroid taper.  Follow-up with me in 1 year

## 2022-05-21 DIAGNOSIS — I10 Essential (primary) hypertension: Secondary | ICD-10-CM | POA: Diagnosis not present

## 2022-05-21 DIAGNOSIS — E538 Deficiency of other specified B group vitamins: Secondary | ICD-10-CM | POA: Diagnosis not present

## 2022-05-27 DIAGNOSIS — I6523 Occlusion and stenosis of bilateral carotid arteries: Secondary | ICD-10-CM | POA: Diagnosis not present

## 2022-05-27 DIAGNOSIS — Z0000 Encounter for general adult medical examination without abnormal findings: Secondary | ICD-10-CM | POA: Diagnosis not present

## 2022-05-27 DIAGNOSIS — J439 Emphysema, unspecified: Secondary | ICD-10-CM | POA: Diagnosis not present

## 2022-05-27 DIAGNOSIS — G4733 Obstructive sleep apnea (adult) (pediatric): Secondary | ICD-10-CM | POA: Diagnosis not present

## 2022-05-27 DIAGNOSIS — G629 Polyneuropathy, unspecified: Secondary | ICD-10-CM | POA: Diagnosis not present

## 2022-05-27 DIAGNOSIS — R269 Unspecified abnormalities of gait and mobility: Secondary | ICD-10-CM | POA: Diagnosis not present

## 2022-05-27 DIAGNOSIS — N1832 Chronic kidney disease, stage 3b: Secondary | ICD-10-CM | POA: Diagnosis not present

## 2022-05-27 DIAGNOSIS — Z23 Encounter for immunization: Secondary | ICD-10-CM | POA: Diagnosis not present

## 2022-05-27 DIAGNOSIS — I10 Essential (primary) hypertension: Secondary | ICD-10-CM | POA: Diagnosis not present

## 2022-05-27 DIAGNOSIS — I739 Peripheral vascular disease, unspecified: Secondary | ICD-10-CM | POA: Diagnosis not present

## 2022-05-27 DIAGNOSIS — E538 Deficiency of other specified B group vitamins: Secondary | ICD-10-CM | POA: Diagnosis not present

## 2022-06-04 ENCOUNTER — Ambulatory Visit: Payer: Self-pay

## 2022-06-04 ENCOUNTER — Encounter: Payer: Self-pay | Admitting: Family Medicine

## 2022-06-04 ENCOUNTER — Ambulatory Visit: Payer: Medicare Other | Admitting: Family Medicine

## 2022-06-04 VITALS — BP 100/62 | HR 74 | Ht 71.5 in | Wt 187.8 lb

## 2022-06-04 DIAGNOSIS — M25512 Pain in left shoulder: Secondary | ICD-10-CM | POA: Diagnosis not present

## 2022-06-04 DIAGNOSIS — M25552 Pain in left hip: Secondary | ICD-10-CM | POA: Diagnosis not present

## 2022-06-04 DIAGNOSIS — G8929 Other chronic pain: Secondary | ICD-10-CM | POA: Insufficient documentation

## 2022-06-04 NOTE — Patient Instructions (Signed)
You received an injection today. Seek immediate medical attention if the joint becomes red, extremely painful, or is oozing fluid.  Plan to follow-up in 3 months, sooner if needed, for left shoulder and left hip.

## 2022-06-04 NOTE — Progress Notes (Signed)
I, Steven Guerrero, CMA acting as a scribe for Steven Leader, MD.  Steven Guerrero is a 82 y.o. male who presents to [place] at [facility] today for male who presents to [place] at [facility] today for 55-monthfollow-up of chronic left shoulder pain due a traumatic RC tear/GH DJD.   Patient was last seen by Dr. CGeorgina Snellon 03/05/22 and was given a repeat L GH steroid injection. Today, pt reports continued left shoulder pain. Pain at posterior aspect of the shoulder. Also c/o pain in the left hip and leg x 3 weeks. Denies recent falls or injury. Pain at lateral aspect of the hip radiating into the upper leg. Denies mechanical sx.   Dx imaging: 11/30/20 L shoulder XR    Pertinent review of systems: No fevers or chills  Relevant historical information: Previous stroke.  History of pulmonary embolism on anticoagulation Eliquis. History of right total hip replacement.  Exam:  BP 100/62   Pulse 74   Ht 5' 11.5" (1.816 m)   Wt 187 lb 12.8 oz (85.2 kg)   SpO2 97%   BMI 25.83 kg/m  General: Well Developed, well nourished, and in no acute distress.   MSK: Left shoulder: Decreased muscle bulk otherwise normal-appearing Decreased range of motion.  Pain with range of motion and crepitation are present.  Left hip: Decreased muscle bulk otherwise normal-appearing Decreased range of motion some pain with abduction. Hip abduction strength is diminished.  Tender palpation greater trochanter.     Lab and Radiology Results  Procedure: Real-time Ultrasound Guided Injection of left shoulder glenohumeral joint posterior approach Device: Philips Affiniti 50G Images permanently stored and available for review in PACS Verbal informed consent obtained.  Discussed risks and benefits of procedure. Warned about infection, bleeding, hyperglycemia damage to structures among others. Patient expresses understanding and agreement Time-out conducted.   Noted no overlying  erythema, induration, or other signs of local infection.   Skin prepped in a sterile fashion.   Local anesthesia: Topical Ethyl chloride.   With sterile technique and under real time ultrasound guidance: 40 mg of Kenalog and 2 mL of Marcaine injected into shoulder joint. Fluid seen entering the joint capsule.   Completed without difficulty   Pain immediately resolved suggesting accurate placement of the medication.   Advised to call if fevers/chills, erythema, induration, drainage, or persistent bleeding.   Images permanently stored and available for review in the ultrasound unit.  Impression: Technically successful ultrasound guided injection.    Procedure: Real-time Ultrasound Guided Injection of left lateral hip greater trochanter bursa Device: Philips Affiniti 50G Images permanently stored and available for review in PACS Verbal informed consent obtained.  Discussed risks and benefits of procedure. Warned about infection, bleeding, hyperglycemia damage to structures among others. Patient expresses understanding and agreement Time-out conducted.   Noted no overlying erythema, induration, or other signs of local infection.   Skin prepped in a sterile fashion.   Local anesthesia: Topical Ethyl chloride.   With sterile technique and under real time ultrasound guidance: 40 mg of Kenalog and 2 mL Marcaine injected into greater trochanter bursa. Fluid seen entering the bursa.   Completed without difficulty   Pain immediately resolved suggesting accurate placement of the medication.   Advised to call if fevers/chills, erythema, induration, drainage, or persistent bleeding.   Images permanently stored and available for review in the ultrasound unit.  Impression: Technically successful ultrasound guided injection.         Assessment and  Plan: 82 y.o. male with left shoulder pain thought to be due to glenohumeral DJD and some rotator cuff pathology.  Plan for repeat glenohumeral  injection.  Has had good benefit from previous steroid injections in the past.  Injection today.  Plan to recheck in 3 months and anticipate potential injection at that time.  Left lateral hip pain.  He does have some degenerative changes seen on prior x-ray of the hip in the past.  Additionally he has an abnormal gait as a result of his stroke.  He has some weakness of his hip abductors from his stroke and probably from underuse as well.  That probably is contributing to what I think is greater trochanteric bursitis.  I will have a very hard time rehabbing his hip as we would conventionally want for trochanteric bursitis given his stroke.  Plan for trial of steroid injection of the greater trochanter and again check back in about 3 months.   PDMP not reviewed this encounter. Orders Placed This Encounter  Procedures   [place] LIMITED JOINT SPACE STRUCTURES UP LEFT(NO LINKED CHARGES)    Order Specific Question:   Reason for Exam (SYMPTOM  OR DIAGNOSIS REQUIRED)    Answer:   left shoulder pain    Order Specific Question:   Preferred imaging location?    Answer:   [place]   No orders of the defined types were placed in this encounter.    Discussed warning signs or symptoms. Please see discharge instructions. Patient expresses understanding.   The above documentation has been reviewed and is accurate and complete Steven Guerrero, M.D.

## 2022-06-22 DIAGNOSIS — E7800 Pure hypercholesterolemia, unspecified: Secondary | ICD-10-CM | POA: Diagnosis not present

## 2022-06-22 DIAGNOSIS — I10 Essential (primary) hypertension: Secondary | ICD-10-CM | POA: Diagnosis not present

## 2022-06-22 DIAGNOSIS — I6529 Occlusion and stenosis of unspecified carotid artery: Secondary | ICD-10-CM | POA: Diagnosis not present

## 2022-08-03 ENCOUNTER — Emergency Department (HOSPITAL_COMMUNITY): Payer: Medicare Other

## 2022-08-03 ENCOUNTER — Encounter (HOSPITAL_COMMUNITY): Payer: Self-pay

## 2022-08-03 ENCOUNTER — Emergency Department (HOSPITAL_COMMUNITY)
Admission: EM | Admit: 2022-08-03 | Discharge: 2022-08-04 | Disposition: A | Discharge status: Home / Self Care | Payer: Medicare Other | Attending: Emergency Medicine | Admitting: Emergency Medicine

## 2022-08-03 ENCOUNTER — Other Ambulatory Visit: Payer: Self-pay

## 2022-08-03 DIAGNOSIS — I4891 Unspecified atrial fibrillation: Secondary | ICD-10-CM | POA: Insufficient documentation

## 2022-08-03 DIAGNOSIS — W19XXXA Unspecified fall, initial encounter: Secondary | ICD-10-CM

## 2022-08-03 DIAGNOSIS — R58 Hemorrhage, not elsewhere classified: Secondary | ICD-10-CM | POA: Diagnosis not present

## 2022-08-03 DIAGNOSIS — W01198A Fall on same level from slipping, tripping and stumbling with subsequent striking against other object, initial encounter: Secondary | ICD-10-CM | POA: Insufficient documentation

## 2022-08-03 DIAGNOSIS — S299XXA Unspecified injury of thorax, initial encounter: Secondary | ICD-10-CM | POA: Diagnosis not present

## 2022-08-03 DIAGNOSIS — Z7901 Long term (current) use of anticoagulants: Secondary | ICD-10-CM | POA: Insufficient documentation

## 2022-08-03 DIAGNOSIS — M545 Low back pain, unspecified: Secondary | ICD-10-CM | POA: Insufficient documentation

## 2022-08-03 DIAGNOSIS — S0990XA Unspecified injury of head, initial encounter: Secondary | ICD-10-CM | POA: Diagnosis not present

## 2022-08-03 DIAGNOSIS — M4316 Spondylolisthesis, lumbar region: Secondary | ICD-10-CM | POA: Diagnosis not present

## 2022-08-03 DIAGNOSIS — S199XXA Unspecified injury of neck, initial encounter: Secondary | ICD-10-CM | POA: Diagnosis not present

## 2022-08-03 DIAGNOSIS — M549 Dorsalgia, unspecified: Secondary | ICD-10-CM | POA: Diagnosis not present

## 2022-08-03 DIAGNOSIS — R519 Headache, unspecified: Secondary | ICD-10-CM | POA: Diagnosis not present

## 2022-08-03 DIAGNOSIS — I10 Essential (primary) hypertension: Secondary | ICD-10-CM | POA: Diagnosis not present

## 2022-08-03 DIAGNOSIS — Z743 Need for continuous supervision: Secondary | ICD-10-CM | POA: Diagnosis not present

## 2022-08-03 DIAGNOSIS — M542 Cervicalgia: Secondary | ICD-10-CM | POA: Insufficient documentation

## 2022-08-03 DIAGNOSIS — Z043 Encounter for examination and observation following other accident: Secondary | ICD-10-CM | POA: Diagnosis not present

## 2022-08-03 LAB — I-STAT CHEM 8, ED
BUN: 26 mg/dL — ABNORMAL HIGH (ref 8–23)
Calcium, Ion: 1.15 mmol/L (ref 1.15–1.40)
Chloride: 107 mmol/L (ref 98–111)
Creatinine, Ser: 1.6 mg/dL — ABNORMAL HIGH (ref 0.61–1.24)
Glucose, Bld: 114 mg/dL — ABNORMAL HIGH (ref 70–99)
HCT: 38 % — ABNORMAL LOW (ref 39.0–52.0)
Hemoglobin: 12.9 g/dL — ABNORMAL LOW (ref 13.0–17.0)
Potassium: 4.1 mmol/L (ref 3.5–5.1)
Sodium: 140 mmol/L (ref 135–145)
TCO2: 24 mmol/L (ref 22–32)

## 2022-08-03 LAB — LACTIC ACID, PLASMA: Lactic Acid, Venous: 1.1 mmol/L (ref 0.5–1.9)

## 2022-08-03 LAB — CBC
HCT: 39 % (ref 39.0–52.0)
Hemoglobin: 13 g/dL (ref 13.0–17.0)
MCH: 32.4 pg (ref 26.0–34.0)
MCHC: 33.3 g/dL (ref 30.0–36.0)
MCV: 97.3 fL (ref 80.0–100.0)
Platelets: 216 10*3/uL (ref 150–400)
RBC: 4.01 MIL/uL — ABNORMAL LOW (ref 4.22–5.81)
RDW: 13.3 % (ref 11.5–15.5)
WBC: 7.4 10*3/uL (ref 4.0–10.5)
nRBC: 0 % (ref 0.0–0.2)

## 2022-08-03 LAB — COMPREHENSIVE METABOLIC PANEL
ALT: 24 U/L (ref 0–44)
AST: 27 U/L (ref 15–41)
Albumin: 3.6 g/dL (ref 3.5–5.0)
Alkaline Phosphatase: 58 U/L (ref 38–126)
Anion gap: 11 (ref 5–15)
BUN: 24 mg/dL — ABNORMAL HIGH (ref 8–23)
CO2: 20 mmol/L — ABNORMAL LOW (ref 22–32)
Calcium: 9.3 mg/dL (ref 8.9–10.3)
Chloride: 106 mmol/L (ref 98–111)
Creatinine, Ser: 1.45 mg/dL — ABNORMAL HIGH (ref 0.61–1.24)
GFR, Estimated: 48 mL/min — ABNORMAL LOW (ref >60)
Glucose, Bld: 116 mg/dL — ABNORMAL HIGH (ref 70–99)
Potassium: 4.1 mmol/L (ref 3.5–5.1)
Sodium: 137 mmol/L (ref 135–145)
Total Bilirubin: 0.8 mg/dL (ref 0.3–1.2)
Total Protein: 7 g/dL (ref 6.5–8.1)

## 2022-08-03 LAB — SAMPLE TO BLOOD BANK

## 2022-08-03 LAB — PROTIME-INR
INR: 1.1 (ref 0.8–1.2)
Prothrombin Time: 14.5 seconds (ref 11.4–15.2)

## 2022-08-03 LAB — ETHANOL: Alcohol, Ethyl (B): <10 mg/dL (ref <10)

## 2022-08-03 NOTE — ED Triage Notes (Signed)
Pt arrived via ems from home after he fell hit back of head on door no loc is on thinners no other ijury statesd

## 2022-08-03 NOTE — ED Notes (Signed)
Pt to ct 

## 2022-08-03 NOTE — ED Notes (Signed)
Xr at bedside

## 2022-08-03 NOTE — ED Provider Notes (Signed)
[place] EMERGENCY DEPARTMENT AT [facility] Provider Note   CSN: [id number] Arrival date & time: 08/03/22  2230     History  Chief Complaint  Patient presents with   Fall    Pt arrived via ems from home after fall hit head on thinners no loc in ccollar [id number] ems vs 142/70 78 93% 18    Steven Guerrero is a 82 y.o. male.  HPI Presents via EMS as a level 2 trauma. Patient is on Eliquis due to history of A-fib.  History is obtained by the patient and EMS individuals. Patient has a known speech deficit from prior TBI. Just prior to EMS notification the patient slipped, falling backwards and striking his head.  He subsequently fell to the ground, landing on his buttocks.  He currently complains of head pain, neck pain and low back pain.  He denies weakness that is new in any extremity, denies confusion, denies chest pain or dyspnea. EMS reports the patient was awake, alert, hemodynamically throughout transport.    Home Medications Prior to Admission medications   Medication Sig Start Date End Date Taking? Authorizing Provider  atorvastatin (LIPITOR) 20 MG tablet Take 20 mg by mouth daily.     [provider]  citalopram (CELEXA) 40 MG tablet Take 40 mg by mouth at bedtime.     [provider]  Evolocumab (REPATHA) 140 MG/ML SOSY Inject 140 mg into the skin every 14 (fourteen) days. Tuesday    [provider]  Ferrous Gluconate-C-Folic Acid (IRON-C PO) Take 1 tablet by mouth at bedtime.    [provider]  fish oil-omega-3 fatty acids 1000 MG capsule Take 1 g by mouth daily.    [provider]  ipratropium-albuterol (DUONEB) 0.5-2.5 (3) MG/3ML SOLN Take 3 mLs by nebulization every 6 (six) hours. 12/24/20   Luciano Cutter, MD  losartan (COZAAR) 100 MG tablet Take 100 mg by mouth daily. 07/16/19   [provider]  mirtazapine (REMERON) 15 MG tablet Take 15 mg by mouth daily. 11/30/20   [provider]  Multiple  Vitamin (MULTIVITAMIN WITH MINERALS) TABS tablet Take 1 tablet by mouth daily.    [provider]  pantoprazole (PROTONIX) 40 MG tablet Take 40 mg by mouth daily.  02/15/16   [provider]  rivaroxaban (XARELTO) 20 MG TABS tablet Take 1 tablet (20 mg total) by mouth daily with supper. Patient taking differently: Take 10 mg by mouth daily with supper. 04/05/18   Joseph Art, DO  vitamin B-12 (CYANOCOBALAMIN) 1000 MCG tablet Take 1,000 mcg by mouth daily.    [provider]      Allergies    Atenolol, Gabapentin, Lotensin [benazepril hcl], and Wellbutrin [bupropion]    Review of Systems   Review of Systems  Unable to perform ROS: Acuity of condition    Physical Exam Updated Vital Signs BP (!) 161/75   Pulse 71   Temp 97.8 F (36.6 C) (Oral)   Resp 19   Ht 5\' 11"  (1.803 m)   Wt 83.9 kg   SpO2 94%   BMI 25.80 kg/m  Physical Exam Vitals and nursing note reviewed.  Constitutional:      General: He is not in acute distress.    Appearance: He is well-developed.     Comments: Elderly male cervical collar in place, awake, alert, sitting upright  HENT:     Head: Normocephalic and atraumatic.     Comments: No obvious traumatic injuries Eyes:  Conjunctiva/sclera: Conjunctivae normal.  Neck:     Comments: No step-off, crepitus, patient denies pain, but cervical collar is in place. Cardiovascular:     Rate and Rhythm: Normal rate and regular rhythm.  Pulmonary:     Effort: Pulmonary effort is normal. No respiratory distress.     Breath sounds: No stridor.  Abdominal:     General: There is no distension.  Musculoskeletal:     Comments: Patient describes low back pain, though he is able to flex each hip without referred pain, pelvis is stable.  Remaining lower extremities upper extremities unremarkable.  Skin:    General: Skin is warm and dry.  Neurological:     Mental Status: He is alert and oriented to person, place, and time.     Comments:  Age-appropriate atrophy, patient follows commands otherwise     ED Results / Procedures / Treatments   Labs (all labs ordered are listed, but only abnormal results are displayed) Labs Reviewed  COMPREHENSIVE METABOLIC PANEL - Abnormal; Notable for the following components:      Result Value   CO2 20 (*)    Glucose, Bld 116 (*)    BUN 24 (*)    Creatinine, Ser 1.45 (*)    GFR, Estimated 48 (*)    All other components within normal limits  CBC - Abnormal; Notable for the following components:   RBC 4.01 (*)    All other components within normal limits  I-STAT CHEM 8, ED - Abnormal; Notable for the following components:   BUN 26 (*)    Creatinine, Ser 1.60 (*)    Glucose, Bld 114 (*)    Hemoglobin 12.9 (*)    HCT 38.0 (*)    All other components within normal limits  LACTIC ACID, PLASMA  PROTIME-INR  ETHANOL  SAMPLE TO BLOOD BANK    EKG EKG Interpretation  Date/Time:  Sunday Aug 03 2022 22:33:22 EDT Ventricular Rate:  73 PR Interval:  165 QRS Duration: 132 QT Interval:  407 QTC Calculation: 449 R Axis:   19 Text Interpretation: Sinus rhythm Right bundle branch block Abnormal ECG Confirmed by Gerhard Munch [phone]) on 08/03/2022 11:36:46 PM  Radiology CT HEAD WO CONTRAST  Result Date: 08/03/2022 CLINICAL DATA:  Fall EXAM: CT HEAD WITHOUT CONTRAST CT CERVICAL SPINE WITHOUT CONTRAST TECHNIQUE: Multidetector CT imaging of the head and cervical spine was performed following the standard protocol without intravenous contrast. Multiplanar CT image reconstructions of the cervical spine were also generated. RADIATION DOSE REDUCTION: This exam was performed according to the departmental dose-optimization program which includes automated exposure control, adjustment of the mA and/or kV according to patient size and/or use of iterative reconstruction technique. COMPARISON:  09/09/2019 FINDINGS: CT HEAD FINDINGS Brain: There is no mass, hemorrhage or extra-axial collection. There is  generalized atrophy without lobar predilection. There is hypoattenuation of the periventricular white matter, most commonly indicating chronic ischemic microangiopathy. Vascular: Atherosclerotic calcification of the vertebral and internal carotid arteries at the skull base. No abnormal hyperdensity of the major intracranial arteries or dural venous sinuses. Skull: The visualized skull base, calvarium and extracranial soft tissues are normal. Sinuses/Orbits: No fluid levels or advanced mucosal thickening of the visualized paranasal sinuses. No mastoid or middle ear effusion. The orbits are normal. CT CERVICAL SPINE FINDINGS Alignment: No static subluxation. Facets are aligned. Occipital condyles are normally positioned. Skull base and vertebrae: No acute fracture. Soft tissues and spinal canal: No prevertebral fluid or swelling. No visible canal hematoma. Disc levels: No advanced  spinal canal or neural foraminal stenosis. Upper chest: Biapical emphysema. Other: Normal visualized paraspinal cervical soft tissues. IMPRESSION: 1. No acute intracranial abnormality. 2. Chronic ischemic microangiopathy and generalized atrophy. 3. No acute fracture or static subluxation of the cervical spine. Electronically Signed   By: Deatra Robinson M.D.   On: 08/03/2022 23:00   CT CERVICAL SPINE WO CONTRAST  Result Date: 08/03/2022 CLINICAL DATA:  Fall EXAM: CT HEAD WITHOUT CONTRAST CT CERVICAL SPINE WITHOUT CONTRAST TECHNIQUE: Multidetector CT imaging of the head and cervical spine was performed following the standard protocol without intravenous contrast. Multiplanar CT image reconstructions of the cervical spine were also generated. RADIATION DOSE REDUCTION: This exam was performed according to the departmental dose-optimization program which includes automated exposure control, adjustment of the mA and/or kV according to patient size and/or use of iterative reconstruction technique. COMPARISON:  09/09/2019 FINDINGS: CT HEAD  FINDINGS Brain: There is no mass, hemorrhage or extra-axial collection. There is generalized atrophy without lobar predilection. There is hypoattenuation of the periventricular white matter, most commonly indicating chronic ischemic microangiopathy. Vascular: Atherosclerotic calcification of the vertebral and internal carotid arteries at the skull base. No abnormal hyperdensity of the major intracranial arteries or dural venous sinuses. Skull: The visualized skull base, calvarium and extracranial soft tissues are normal. Sinuses/Orbits: No fluid levels or advanced mucosal thickening of the visualized paranasal sinuses. No mastoid or middle ear effusion. The orbits are normal. CT CERVICAL SPINE FINDINGS Alignment: No static subluxation. Facets are aligned. Occipital condyles are normally positioned. Skull base and vertebrae: No acute fracture. Soft tissues and spinal canal: No prevertebral fluid or swelling. No visible canal hematoma. Disc levels: No advanced spinal canal or neural foraminal stenosis. Upper chest: Biapical emphysema. Other: Normal visualized paraspinal cervical soft tissues. IMPRESSION: 1. No acute intracranial abnormality. 2. Chronic ischemic microangiopathy and generalized atrophy. 3. No acute fracture or static subluxation of the cervical spine. Electronically Signed   By: Deatra Robinson M.D.   On: 08/03/2022 23:00   DG Pelvis Portable  Result Date: 08/03/2022 CLINICAL DATA:  Status post fall. EXAM: PORTABLE PELVIS 1-2 VIEWS COMPARISON:  September 20, 2015 FINDINGS: There is no evidence of an acute pelvic fracture or diastasis. No pelvic bone lesions are seen. A total right hip replacement is noted. There is no evidence of surrounding lucency to suggest the presence of hardware loosening or infection. IMPRESSION: 1. No acute fracture or diastasis. 2. Total right hip replacement without evidence of hardware complication. Electronically Signed   By: Aram Candela M.D.   On: 08/03/2022 22:59   DG  Chest Port 1 View  Result Date: 08/03/2022 CLINICAL DATA:  Status post trauma. EXAM: PORTABLE CHEST 1 VIEW COMPARISON:  September 09, 2019 FINDINGS: The heart size and mediastinal contours are within normal limits. Low lung volumes are noted. Mild, diffuse, chronic appearing lung markings are noted. There is no evidence of acute infiltrate, pleural effusion or pneumothorax. Multiple bilateral pleural based calcifications are seen. No acute osseous abnormalities are identified. IMPRESSION: Low lung volumes and chronic changes, without evidence of acute cardiopulmonary disease. Electronically Signed   By: Aram Candela M.D.   On: 08/03/2022 22:58    Procedures Procedures    Medications Ordered in ED Medications - No data to display  ED Course/ Medical Decision Making/ A&P Clinical Course as of 08/04/22 0018  Mon Aug 04, 2022  0011 Pending lumbar XR to dc, level 2 trauma, labs/imaging o/w stable.  [SG]    Clinical Course User Index [SG] Tanda Rockers  A, DO                             Medical Decision Making Adult male on anticoagulation for history of A-fib presents as a level 2 trauma.  Given head trauma concern for intracranial injury as well as additional concern for fracture, head, neck, low back are considerations.  Report of usual state of health prior to the event is somewhat reassuring.  Labs sent CT, x-ray ordered.   Amount and/or Complexity of Data Reviewed Independent Historian: EMS External Data Reviewed: notes. Labs: ordered. Decision-making details documented in ED Course. Radiology: ordered and independent interpretation performed. Decision-making details documented in ED Course.  Risk Prescription drug management. Decision regarding hospitalization.      12:18 AM Patient awake, alert, companied by his wife I discussed all findings thus far, generally reassuring CT scans, labs consistent with multiple prior studies, no evidence for intracranial abnormality, skull  fracture, neck fracture.  Cervical collar removed cervical spine cleared. Patient is awaiting lumbar spine x-rays, but is distally neurovascularly intact, may be appropriate for discharge pending those results. Dr. Wallace Cullens is aware.     Final Clinical Impression(s) / ED Diagnoses Final diagnoses:  Fall, initial encounter    Rx / DC Orders ED Discharge Orders     None         Gerhard Munch, MD 08/04/22 [phone]

## 2022-08-03 NOTE — ED Notes (Signed)
Trauma Response Nurse Documentation   Steven Guerrero is a 82 y.o. male arriving to [facility] ED via EMS  On Xarelto (rivaroxaban) daily. Trauma was activated as a Level 2 by ED charge RN based on the following trauma criteria Elderly patients > 65 with head trauma on anti-coagulation (excluding ASA).  Patient cleared for CT by Dr. Jeraldine Loots EDP. Pt transported to CT with trauma response nurse present to monitor. RN remained with the patient throughout their absence from the department for clinical observation.   GCS 15.  History   Past Medical History:  Diagnosis Date   Anxiety and depression    Back pain    Brain bleed (HCC)    2003   CVA (cerebral infarction)    2001   Hypercholesteremia    Hypertension    Peripheral neuropathy    Pulmonary embolism (HCC)    Sleep apnea    Stroke (HCC)    2001   TBI (traumatic brain injury) (HCC)      Past Surgical History:  Procedure Laterality Date   HERNIA REPAIR     JOINT REPLACEMENT Right    hip replacement   LEG SURGERY     Broken femur   TOTAL HIP ARTHROPLASTY     Right       Initial Focused Assessment (If applicable, or please see trauma documentation): Alert/oriented male presents via EMS after a fall backwards striking his head on a door and landing on his buttock. No wounds noted. Arrives in EMS c-collar  Airway patent/unobstructed, BS clear No obvious uncontrolled hemorrhage GCS 15  CT's Completed:   CT Head and CT C-Spine   Interventions:  IV start and trauma lab draw Portable chest and pelvis XRAY Lumbar XRAY CT head and cervical spine [place] J collar  Plan for disposition:  Pending workup   Consults completed:  none at the time of this note.  Event Summary: Presents after fall into a door with head injury. Landed on his butt. No LOC, no obvious wounds. C/o back pain. Escorted to CT and back. Wife at bedside.   Bedside handoff with ED RN Aggie Cosier.    Steven Guerrero  Trauma Response RN  Please call  TRN at [phone] for further assistance.

## 2022-08-03 NOTE — ED Notes (Signed)
Pt arrived to room via ems from home. Pt had witnessed fall backwards into door hitting head. NO LOC. Pt wearing collar on arrival. Pt a/o x 4 respirations even and non labored skin intact. Pt on Xarelto pt hx of TBI and stroke with speech deficit. Dr Jeraldine Loots lab tech nt and trauma rn all in room pta.

## 2022-08-04 ENCOUNTER — Emergency Department (HOSPITAL_COMMUNITY): Payer: Medicare Other

## 2022-08-04 DIAGNOSIS — M4316 Spondylolisthesis, lumbar region: Secondary | ICD-10-CM | POA: Diagnosis not present

## 2022-08-04 NOTE — Progress Notes (Signed)
Orthopedic Tech Progress Note Patient Details:  Steven Guerrero 06/10/1940 [id number]  Patient ID: Ezzard Flax Pamer, male   DOB: [birth year], 82 y.o.   MRN: [id number] I attended trauma page. Trinna Post 08/04/2022, 6:50 AM

## 2022-08-04 NOTE — ED Provider Notes (Signed)
  Provider Note MRN:  [id number]  Arrival date & time: 08/04/22    ED Course and Medical Decision Making  Assumed care from Dr Jeraldine Loots at shift change.  See note from prior team for complete details, in brief:  82 yo male here with fall Hx TBI w/ residual speech deficit Larey Seat backwards and struck his head Complaint of low back pain  Clinical Course as of 08/04/22 0100  Mon Aug 04, 2022  0011 Pending lumbar XR to dc, level 2 trauma, labs/imaging o/w stable.  [SG]  0054 Creatinine(!): 1.45 Similar to prior [SG]    Clinical Course User Index [SG] Sloan Leiter, DO    Plan per prior physician f/u lumbar XR   Imaging reviewed and stable, no acute fracture or dislocation.  No acute intracranial abnormality on CT head. Patient is feeling better on reassessment.  He is requesting discharge Offered analgesia, spouse reports they have medications at home they would prefer to take instead They are eager to get back home  Pt o/w feeling back to his baseline, no saddle paresthesias, no new numbness or weakness to his lower extremities.  No fevers.  Patient presents with low back pain following fall without signs of spinal cord compression, cauda equina syndrome, infection, aneurysm, or other serious etiology. The patient is neurologically intact. Given the extremely low risk of these diagnoses further testing and evaluation for these possibilities does not appear to be indicated at this time. Detailed discussions were had with the patient and/or family and caregivers, regarding current findings, and need for close f/u with PCP or on call doctor. The patient has been instructed to return immediately if the symptoms worsen in any way. Patient verbalized understanding and is in agreement with current care plan. All questions answered prior to discharge.    Procedures  Final Clinical Impressions(s) / ED Diagnoses     ICD-10-CM   1. Fall, initial encounter  [email or web address]       ED Discharge Orders      None         Discharge Instructions      As discussed, your evaluation today has been largely reassuring.  But, it is important that you monitor your condition carefully, and do not hesitate to return to the ED if you develop new, or concerning changes in your condition.  Otherwise, please follow-up with your physician for appropriate ongoing care.        Tanda Rockers A, DO 08/04/22 0100

## 2022-08-04 NOTE — Discharge Instructions (Addendum)
As discussed, your evaluation today has been largely reassuring.  But, it is important that you monitor your condition carefully, and do not hesitate to return to the ED if you develop new, or concerning changes in your condition. ? ?Otherwise, please follow-up with your physician for appropriate ongoing care. ? ?

## 2022-08-17 ENCOUNTER — Emergency Department (HOSPITAL_COMMUNITY)
Admission: EM | Admit: 2022-08-17 | Discharge: 2022-08-17 | Disposition: A | Discharge status: Home / Self Care | Payer: Medicare Other | Attending: Emergency Medicine | Admitting: Emergency Medicine

## 2022-08-17 ENCOUNTER — Emergency Department (HOSPITAL_COMMUNITY): Payer: Medicare Other

## 2022-08-17 DIAGNOSIS — S0083XA Contusion of other part of head, initial encounter: Secondary | ICD-10-CM | POA: Diagnosis not present

## 2022-08-17 DIAGNOSIS — W19XXXA Unspecified fall, initial encounter: Secondary | ICD-10-CM

## 2022-08-17 DIAGNOSIS — Y92091 Bathroom in other non-institutional residence as the place of occurrence of the external cause: Secondary | ICD-10-CM | POA: Diagnosis not present

## 2022-08-17 DIAGNOSIS — I6381 Other cerebral infarction due to occlusion or stenosis of small artery: Secondary | ICD-10-CM | POA: Diagnosis not present

## 2022-08-17 DIAGNOSIS — W01198A Fall on same level from slipping, tripping and stumbling with subsequent striking against other object, initial encounter: Secondary | ICD-10-CM | POA: Insufficient documentation

## 2022-08-17 DIAGNOSIS — S199XXA Unspecified injury of neck, initial encounter: Secondary | ICD-10-CM | POA: Diagnosis not present

## 2022-08-17 DIAGNOSIS — Z79899 Other long term (current) drug therapy: Secondary | ICD-10-CM | POA: Diagnosis not present

## 2022-08-17 DIAGNOSIS — I10 Essential (primary) hypertension: Secondary | ICD-10-CM | POA: Diagnosis not present

## 2022-08-17 DIAGNOSIS — S0990XA Unspecified injury of head, initial encounter: Secondary | ICD-10-CM

## 2022-08-17 DIAGNOSIS — Z7901 Long term (current) use of anticoagulants: Secondary | ICD-10-CM | POA: Diagnosis not present

## 2022-08-17 NOTE — ED Triage Notes (Signed)
Pt through the front door as a fall on thinners. Approximately 30 min PTA he was in the bathroom and fell, striking his head on the handrail. No LOC. Anticoagulated on Xarelto. Pt denies other injuries. A+Ox3 (missing time which is normal per family). 162/74

## 2022-08-17 NOTE — Progress Notes (Signed)
   08/17/22 1537  Spiritual Encounters  Type of Visit Attempt (pt unavailable)  Care provided to: Family  Referral source Code page  Reason for visit Trauma  OnCall Visit Yes  Spiritual Framework  Presenting Themes Community and relationships;Other (comment)  Interventions  Spiritual Care Interventions Made Established relationship of care and support;Compassionate presence;Reflective listening  Intervention Outcomes  Outcomes Reduced anxiety;Awareness of support   Responded to trauma level 2, patient was taken for a CT, spoke with family members who stated patient was alert and they expect him to be find. They comforted each other. Asked if there was anything they needed or if I can assist in any why. They stated not at this time but if they need anything theyb will ask for the chaplain to return. Thanked them and left to another call.

## 2022-08-17 NOTE — ED Notes (Signed)
Dr Pickering at bedside 

## 2022-08-17 NOTE — ED Provider Notes (Signed)
[place] [facility] AT [facility] Provider Note   CSN: [id number] Arrival date & time: 08/17/22  1531     History  Chief Complaint  Patient presents with   Steven Guerrero is a 82 y.o. male.   Fall  Patient presents after fall.  Was in the bathroom and fell after he lost balance.  Hitting his forehead.  Some mild confusion which appears to be baseline.  Is on Xarelto.  Some mild back pain unchanged from fall a few weeks ago.  No numbness or weakness.  No abdominal pain or chest pain.    Past Medical History:  Diagnosis Date   Anxiety and depression    Back pain    Brain bleed (HCC)    2003   CVA (cerebral infarction)    2001   Hypercholesteremia    Hypertension    Peripheral neuropathy    Pulmonary embolism (HCC)    Sleep apnea    Stroke [facility])    2001   TBI (traumatic brain injury) (HCC)     Home Medications Prior to Admission medications   Medication Sig Start Date End Date Taking? Authorizing Provider  atorvastatin (LIPITOR) 20 MG tablet Take 20 mg by mouth daily.     [provider]  citalopram (CELEXA) 40 MG tablet Take 40 mg by mouth at bedtime.     [provider]  Evolocumab (REPATHA) 140 MG/ML SOSY Inject 140 mg into the skin every 14 (fourteen) days. Tuesday    [provider]  Ferrous Gluconate-C-Folic Acid (IRON-C PO) Take 1 tablet by mouth at bedtime.    [provider]  fish oil-omega-3 fatty acids 1000 MG capsule Take 1 g by mouth daily.    [provider]  ipratropium-albuterol (DUONEB) 0.5-2.5 (3) MG/3ML SOLN Take 3 mLs by nebulization every 6 (six) hours. 12/24/20   Luciano Cutter, MD  losartan (COZAAR) 100 MG tablet Take 100 mg by mouth daily. 07/16/19   [provider]  mirtazapine (REMERON) 15 MG tablet Take 15 mg by mouth daily. 11/30/20   [provider]  Multiple Vitamin (MULTIVITAMIN WITH MINERALS) TABS tablet Take 1 tablet by mouth daily.    [provider]  pantoprazole (PROTONIX) 40 MG tablet Take 40 mg by mouth daily.  02/15/16   [provider]  rivaroxaban (XARELTO) 20 MG TABS tablet Take 1 tablet (20 mg total) by mouth daily with supper. Patient taking differently: Take 10 mg by mouth daily with supper. 04/05/18   Joseph Art, DO  vitamin B-12 (CYANOCOBALAMIN) 1000 MCG tablet Take 1,000 mcg by mouth daily.    [provider]      Allergies    Atenolol, Gabapentin, Lotensin [benazepril hcl], and Wellbutrin [bupropion]    Review of Systems   Review of Systems  Physical Exam Updated Vital Signs BP (!) 143/95   Pulse 90   Temp 97.9 F (36.6 C) (Oral)   Resp (!) 23   Ht 5\' 11"  (1.803 m)   Wt 83.9 kg   SpO2 94%   BMI 25.80 kg/m  Physical Exam Vitals reviewed.  HENT:     Head:     Comments: Hematoma right forehead.  Eye movements intact.  Face nontender. Eyes:     Extraocular Movements: Extraocular movements intact.  Abdominal:     Tenderness: There is no abdominal tenderness.  Musculoskeletal:        General: No tenderness.  Cervical back: No tenderness.  Skin:    Coloration: Skin is not jaundiced.  Neurological:     Mental Status: He is alert. Mental status is at baseline.     ED Results / Procedures / Treatments   Labs (all labs ordered are listed, but only abnormal results are displayed) Labs Reviewed - No data to display  EKG EKG Interpretation  Date/Time:  Sunday Aug 17 2022 15:54:25 EDT Ventricular Rate:  91 PR Interval:  152 QRS Duration: 130 QT Interval:  381 QTC Calculation: 469 R Axis:   26 Text Interpretation: Sinus rhythm IVCD, consider atypical RBBB Confirmed by Benjiman Core [phone] on 08/17/2022 4:14:51 PM  Radiology CT HEAD WO CONTRAST ( )  Result Date: 08/17/2022 CLINICAL DATA:  Head and neck trauma EXAM: CT HEAD WITHOUT CONTRAST CT CERVICAL SPINE WITHOUT CONTRAST TECHNIQUE: Multidetector CT imaging of the head and cervical spine was performed  following the standard protocol without intravenous contrast. Multiplanar CT image reconstructions of the cervical spine were also generated. RADIATION DOSE REDUCTION: This exam was performed according to the departmental dose-optimization program which includes automated exposure control, adjustment of the mA and/or kV according to patient size and/or use of iterative reconstruction technique. COMPARISON:  08/03/2022 FINDINGS: CT HEAD FINDINGS Brain: No evidence of acute infarction, hemorrhage, hydrocephalus, extra-axial collection or mass lesion/mass effect. Periventricular and deep white matter hypodensity. Lacunar infarctions of the basal ganglia. Vascular: No hyperdense vessel or unexpected calcification. Skull: Normal. Negative for fracture or focal lesion. Sinuses/Orbits: No acute finding. Other: None. CT CERVICAL SPINE FINDINGS Alignment: Normal. Skull base and vertebrae: No acute fracture. No primary bone lesion or focal pathologic process. Soft tissues and spinal canal: No prevertebral fluid or swelling. No visible canal hematoma. Disc levels: Moderate multilevel disc space height loss and osteophytosis. Upper chest: Negative. Other: None. IMPRESSION: 1. No acute intracranial pathology. Small-vessel white matter disease and old lacunar infarctions of the basal ganglia. 2. No fracture or static subluxation of the cervical spine. 3. Moderate multilevel cervical disc degenerative disease. Electronically Signed   By: Jearld Lesch M.D.   On: 08/17/2022 16:14   CT Cervical Spine Wo Contrast  Result Date: 08/17/2022 CLINICAL DATA:  Head and neck trauma EXAM: CT HEAD WITHOUT CONTRAST CT CERVICAL SPINE WITHOUT CONTRAST TECHNIQUE: Multidetector CT imaging of the head and cervical spine was performed following the standard protocol without intravenous contrast. Multiplanar CT image reconstructions of the cervical spine were also generated. RADIATION DOSE REDUCTION: This exam was performed according to the  departmental dose-optimization program which includes automated exposure control, adjustment of the mA and/or kV according to patient size and/or use of iterative reconstruction technique. COMPARISON:  08/03/2022 FINDINGS: CT HEAD FINDINGS Brain: No evidence of acute infarction, hemorrhage, hydrocephalus, extra-axial collection or mass lesion/mass effect. Periventricular and deep white matter hypodensity. Lacunar infarctions of the basal ganglia. Vascular: No hyperdense vessel or unexpected calcification. Skull: Normal. Negative for fracture or focal lesion. Sinuses/Orbits: No acute finding. Other: None. CT CERVICAL SPINE FINDINGS Alignment: Normal. Skull base and vertebrae: No acute fracture. No primary bone lesion or focal pathologic process. Soft tissues and spinal canal: No prevertebral fluid or swelling. No visible canal hematoma. Disc levels: Moderate multilevel disc space height loss and osteophytosis. Upper chest: Negative. Other: None. IMPRESSION: 1. No acute intracranial pathology. Small-vessel white matter disease and old lacunar infarctions of the basal ganglia. 2. No fracture or static subluxation of the cervical spine. 3. Moderate multilevel cervical disc degenerative disease. Electronically Signed   By: Jearld Lesch  M.D.   On: 08/17/2022 16:14    Procedures Procedures    Medications Ordered in ED Medications - No data to display  ED Course/ Medical Decision Making/ A&P                             Medical Decision Making Amount and/or Complexity of Data Reviewed Radiology: ordered.   Patient with mechanical fall.  Unsteady at baseline.  On anticoagulation apparently due to previous PEs.  Differential diagnosis includes intracranial hemorrhage, nonspecific head injury.  Will get head CT and cervical spine CT to evaluate.  Also cervical spine injury considered due to age.  No other apparent injury.  Discussed with patient's family, who states he is at baseline.  Head CT and cervical  spine reassuring.  At baseline.  Appears stable for discharge home.        Final Clinical Impression(s) / ED Diagnoses Final diagnoses:  Fall, initial encounter  Injury of head, initial encounter    Rx / DC Orders ED Discharge Orders     None         Benjiman Core, MD 08/17/22 2252

## 2022-08-17 NOTE — Progress Notes (Signed)
Orthopedic Tech Progress Note Patient Details:  Steven Guerrero February 12, 1941 [id number]  Level II trauma, ortho tech not needed at this time  Patient ID: [name], male   DOB: 1941/03/19, 82 y.o.   MRN: [id number]  Docia Furl 08/17/2022, 4:29 PM

## 2022-08-17 NOTE — ED Notes (Signed)
Pt transported to CT ?

## 2022-08-21 ENCOUNTER — Telehealth: Payer: Self-pay

## 2022-08-21 NOTE — Telephone Encounter (Signed)
Transition Care Management Follow-up Telephone Call Date of discharge and from where: [facility] 5/19 How have you been since you were released from the hospital? Doing good  Any questions or concerns? No  Items Reviewed: Did the pt receive and understand the discharge instructions provided? Yes  Medications obtained and verified? Yes  Other? No  Any new allergies since your discharge? No  Dietary orders reviewed? No Do you have support at home? Yes    Follow up appointments reviewed:  PCP Hospital f/u appt confirmed? Yes  Scheduled to see PCP  on NEXT WEEK @ . Specialist Hospital f/u appt confirmed?  Scheduled to see  on  @ . Are transportation arrangements needed? No  If their condition worsens, is the pt aware to call PCP or go to the Emergency Dept.? Yes Was the patient provided with contact information for the PCP's office or ED? Yes Was to pt encouraged to call back with questions or concerns? Yes

## 2022-08-22 DIAGNOSIS — I6529 Occlusion and stenosis of unspecified carotid artery: Secondary | ICD-10-CM | POA: Diagnosis not present

## 2022-08-22 DIAGNOSIS — E7800 Pure hypercholesterolemia, unspecified: Secondary | ICD-10-CM | POA: Diagnosis not present

## 2022-08-22 DIAGNOSIS — I10 Essential (primary) hypertension: Secondary | ICD-10-CM | POA: Diagnosis not present

## 2022-09-01 DIAGNOSIS — R269 Unspecified abnormalities of gait and mobility: Secondary | ICD-10-CM | POA: Diagnosis not present

## 2022-09-01 DIAGNOSIS — R634 Abnormal weight loss: Secondary | ICD-10-CM | POA: Diagnosis not present

## 2022-09-01 DIAGNOSIS — R197 Diarrhea, unspecified: Secondary | ICD-10-CM | POA: Diagnosis not present

## 2022-09-01 DIAGNOSIS — I6990 Unspecified sequelae of unspecified cerebrovascular disease: Secondary | ICD-10-CM | POA: Diagnosis not present

## 2022-09-01 DIAGNOSIS — J439 Emphysema, unspecified: Secondary | ICD-10-CM | POA: Diagnosis not present

## 2022-09-01 DIAGNOSIS — Z23 Encounter for immunization: Secondary | ICD-10-CM | POA: Diagnosis not present

## 2022-09-04 ENCOUNTER — Encounter: Payer: Self-pay | Admitting: Family Medicine

## 2022-09-04 ENCOUNTER — Ambulatory Visit: Payer: Medicare Other | Admitting: Family Medicine

## 2022-09-04 ENCOUNTER — Other Ambulatory Visit: Payer: Self-pay

## 2022-09-04 VITALS — BP 110/70 | HR 80 | Ht 71.0 in | Wt 176.0 lb

## 2022-09-04 DIAGNOSIS — M25512 Pain in left shoulder: Secondary | ICD-10-CM

## 2022-09-04 DIAGNOSIS — G8929 Other chronic pain: Secondary | ICD-10-CM

## 2022-09-04 DIAGNOSIS — M25552 Pain in left hip: Secondary | ICD-10-CM

## 2022-09-04 NOTE — Progress Notes (Signed)
   Rubin Payor, PhD, LAT, ATC acting as a scribe for Clementeen Graham, MD.  Steven Guerrero is a 82 y.o. male who presents to [facility] today for 39-month f/u of chronic left shoulder pain due a traumatic RC tear/GH DJD.   Patient was last seen by Dr. Denyse Amass on 06/04/22 and was given a L GH and L GT steroid injections. Of note, since his last visit, he has suffered 2 falls, May 12th and 26th and was seen at the [facility] ED. Left hip is doing well, but left shoulder is starting to hurt again since the injection last time. Patient states he had about a month of significant relief from the injection.  He still feeling better today than he was before the last injection.  He thinks that the injection is still working some but his pain is increasing now.    Dx imaging: 11/30/20 L shoulder XR   Pertinent review of systems: No fevers or chills  Relevant historical information: History of stroke   Exam:  BP 110/70   Pulse 80   Ht 5\' 11"  (1.803 m)   Wt 176 lb (79.8 kg)   SpO2 96%   BMI 24.55 kg/m  General: Well Developed, well nourished, and in no acute distress.   MSK: Left shoulder: Normal-appearing Decreased range of motion.  Pain with abduction.    Lab and Radiology Results  Procedure: Real-time Ultrasound Guided Injection of left shoulder glenohumeral joint posterior approach Device: Philips Affiniti 50G Images permanently stored and available for review in PACS Verbal informed consent obtained.  Discussed risks and benefits of procedure. Warned about infection, bleeding, hyperglycemia damage to structures among others. Patient expresses understanding and agreement Time-out conducted.   Noted no overlying erythema, induration, or other signs of local infection.   Skin prepped in a sterile fashion.   Local anesthesia: Topical Ethyl chloride.   With sterile technique and under real time ultrasound guidance: 40 mg of Kenalog and 2 mL of Marcaine injected  into glenohumeral joint. Fluid seen entering the joint capsule.   Completed without difficulty   Pain immediately resolved suggesting accurate placement of the medication.   Advised to call if fevers/chills, erythema, induration, drainage, or persistent bleeding.   Images permanently stored and available for review in the ultrasound unit.  Impression: Technically successful ultrasound guided injection.         Assessment and Plan: 82 y.o. male with chronic left shoulder pain due to degenerative changes and likely complete rotator cuff tear.  Plan for steroid injection today.  We can anticipate that he will probably benefit from another injection in about 3 months.  Recheck in 3 months to consider injection at that time.  Continue current treatment otherwise.   PDMP not reviewed this encounter. Orders Placed This Encounter  Procedures   [place] LIMITED JOINT SPACE STRUCTURES LOW LEFT(NO LINKED CHARGES)    Order Specific Question:   Reason for Exam (SYMPTOM  OR DIAGNOSIS REQUIRED)    Answer:   left shoulder pain, left hip pain    Order Specific Question:   Preferred imaging location?    Answer:   [facility]   No orders of the defined types were placed in this encounter.    Discussed warning signs or symptoms. Please see discharge instructions. Patient expresses understanding.   The above documentation has been reviewed and is accurate and complete Clementeen Graham, M.D.

## 2022-09-04 NOTE — Patient Instructions (Signed)
Thank you for coming in today.   Call or go to the ER if you develop a large red swollen joint with extreme pain or oozing puss.    Return in 3 months.    
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
## 2022-09-08 ENCOUNTER — Ambulatory Visit: Payer: Medicare Other | Attending: General Practice | Admitting: General Practice

## 2022-09-08 ENCOUNTER — Encounter: Payer: Self-pay | Admitting: General Practice

## 2022-09-08 VITALS — BP 130/60 | HR 88 | Ht 71.0 in | Wt 177.0 lb

## 2022-09-08 DIAGNOSIS — Z86711 Personal history of pulmonary embolism: Secondary | ICD-10-CM | POA: Diagnosis not present

## 2022-09-08 DIAGNOSIS — E785 Hyperlipidemia, unspecified: Secondary | ICD-10-CM | POA: Diagnosis not present

## 2022-09-08 DIAGNOSIS — Z8673 Personal history of transient ischemic attack (TIA), and cerebral infarction without residual deficits: Secondary | ICD-10-CM

## 2022-09-08 DIAGNOSIS — I10 Essential (primary) hypertension: Secondary | ICD-10-CM

## 2022-09-08 DIAGNOSIS — I714 Abdominal aortic aneurysm, without rupture, unspecified: Secondary | ICD-10-CM

## 2022-09-08 DIAGNOSIS — I5032 Chronic diastolic (congestive) heart failure: Secondary | ICD-10-CM

## 2022-09-08 NOTE — Patient Instructions (Addendum)
Medication Instructions:  The current medical regimen is effective;  continue present plan and medications as directed. Please refer to the Current Medication list given to you today.  *If you need a refill on your cardiac medications before your next appointment, please call your pharmacy*  Lab Work: NONE ordered at this time of appointment   If you have labs (blood work) drawn today and your tests are completely normal, you will receive your results only by:   MyChart Message (if you have MyChart) OR A paper copy in the mail If you have any lab test that is abnormal or we need to change your treatment, we will call you to review the results.  Testing/Procedures: NONE ordered at this time of appointment    Other Instructions INCREASE PHYSICAL ACTIVITY AS TOLERATED  PLEASE READ AND FOLLOW ATTACHED  SALTY 6   Follow-Up: At [facility], you and your health needs are our priority.  As part of our continuing mission to provide you with exceptional heart care, we have created designated Provider Care Teams.  These Care Teams include your primary Cardiologist (physician) and Advanced Practice Providers (APPs -  Physician Assistants and Nurse Practitioners) who all work together to provide you with the care you need, when you need it.  Your next appointment:   6 month(s)  Provider:   Nicki Guadalajara, MD

## 2022-09-12 DIAGNOSIS — J418 Mixed simple and mucopurulent chronic bronchitis: Secondary | ICD-10-CM | POA: Diagnosis not present

## 2022-09-12 DIAGNOSIS — I110 Hypertensive heart disease with heart failure: Secondary | ICD-10-CM | POA: Diagnosis not present

## 2022-09-12 DIAGNOSIS — Z86711 Personal history of pulmonary embolism: Secondary | ICD-10-CM | POA: Diagnosis not present

## 2022-09-12 DIAGNOSIS — I5032 Chronic diastolic (congestive) heart failure: Secondary | ICD-10-CM | POA: Diagnosis not present

## 2022-09-12 DIAGNOSIS — I7143 Infrarenal abdominal aortic aneurysm, without rupture: Secondary | ICD-10-CM | POA: Diagnosis not present

## 2022-09-12 DIAGNOSIS — G4733 Obstructive sleep apnea (adult) (pediatric): Secondary | ICD-10-CM | POA: Diagnosis not present

## 2022-09-12 DIAGNOSIS — M159 Polyosteoarthritis, unspecified: Secondary | ICD-10-CM | POA: Diagnosis not present

## 2022-09-12 DIAGNOSIS — R634 Abnormal weight loss: Secondary | ICD-10-CM | POA: Diagnosis not present

## 2022-09-12 DIAGNOSIS — Z8673 Personal history of transient ischemic attack (TIA), and cerebral infarction without residual deficits: Secondary | ICD-10-CM | POA: Diagnosis not present

## 2022-09-12 DIAGNOSIS — Z87891 Personal history of nicotine dependence: Secondary | ICD-10-CM | POA: Diagnosis not present

## 2022-09-12 DIAGNOSIS — I10 Essential (primary) hypertension: Secondary | ICD-10-CM | POA: Diagnosis not present

## 2022-09-18 DIAGNOSIS — R634 Abnormal weight loss: Secondary | ICD-10-CM | POA: Diagnosis not present

## 2022-09-18 DIAGNOSIS — R61 Generalized hyperhidrosis: Secondary | ICD-10-CM | POA: Diagnosis not present

## 2022-10-08 DIAGNOSIS — D472 Monoclonal gammopathy: Secondary | ICD-10-CM | POA: Diagnosis not present

## 2022-10-28 ENCOUNTER — Inpatient Hospital Stay: Payer: Medicare Other

## 2022-10-28 ENCOUNTER — Other Ambulatory Visit: Payer: Self-pay

## 2022-10-28 ENCOUNTER — Inpatient Hospital Stay: Payer: Medicare Other | Attending: Hematology | Admitting: Hematology

## 2022-10-28 ENCOUNTER — Encounter: Payer: Self-pay | Admitting: Hematology

## 2022-10-28 VITALS — BP 133/66 | HR 64 | Temp 97.7°F | Resp 18 | Ht 71.0 in | Wt 178.0 lb

## 2022-10-28 DIAGNOSIS — Z86711 Personal history of pulmonary embolism: Secondary | ICD-10-CM | POA: Diagnosis not present

## 2022-10-28 DIAGNOSIS — Z8249 Family history of ischemic heart disease and other diseases of the circulatory system: Secondary | ICD-10-CM | POA: Diagnosis not present

## 2022-10-28 DIAGNOSIS — M25519 Pain in unspecified shoulder: Secondary | ICD-10-CM | POA: Insufficient documentation

## 2022-10-28 DIAGNOSIS — I129 Hypertensive chronic kidney disease with stage 1 through stage 4 chronic kidney disease, or unspecified chronic kidney disease: Secondary | ICD-10-CM | POA: Diagnosis not present

## 2022-10-28 DIAGNOSIS — Z8673 Personal history of transient ischemic attack (TIA), and cerebral infarction without residual deficits: Secondary | ICD-10-CM | POA: Insufficient documentation

## 2022-10-28 DIAGNOSIS — R768 Other specified abnormal immunological findings in serum: Secondary | ICD-10-CM | POA: Insufficient documentation

## 2022-10-28 DIAGNOSIS — Z79899 Other long term (current) drug therapy: Secondary | ICD-10-CM | POA: Insufficient documentation

## 2022-10-28 DIAGNOSIS — R61 Generalized hyperhidrosis: Secondary | ICD-10-CM | POA: Diagnosis not present

## 2022-10-28 DIAGNOSIS — D472 Monoclonal gammopathy: Secondary | ICD-10-CM | POA: Insufficient documentation

## 2022-10-28 DIAGNOSIS — Z7901 Long term (current) use of anticoagulants: Secondary | ICD-10-CM | POA: Insufficient documentation

## 2022-10-28 DIAGNOSIS — R7989 Other specified abnormal findings of blood chemistry: Secondary | ICD-10-CM | POA: Diagnosis not present

## 2022-10-28 DIAGNOSIS — R779 Abnormality of plasma protein, unspecified: Secondary | ICD-10-CM | POA: Insufficient documentation

## 2022-10-28 DIAGNOSIS — N189 Chronic kidney disease, unspecified: Secondary | ICD-10-CM | POA: Diagnosis not present

## 2022-10-28 DIAGNOSIS — Z87891 Personal history of nicotine dependence: Secondary | ICD-10-CM | POA: Insufficient documentation

## 2022-10-28 DIAGNOSIS — Z888 Allergy status to other drugs, medicaments and biological substances status: Secondary | ICD-10-CM | POA: Diagnosis not present

## 2022-10-28 NOTE — Progress Notes (Signed)
[facility]   Telephone:([phone]) [phone] Fax:([phone]   Clinic New Consult Note   Patient Care Team: Merri Brunette, MD as PCP - General (Internal Medicine) Lennette Bihari, MD as PCP - Cardiology (Cardiology) Andrena Mews, DO as Consulting Physician (Family Medicine)  Date of Service:  10/28/2022   CHIEF COMPLAINTS/PURPOSE OF CONSULTATION:  Abnormal Protein in blood  REFERRING PHYSICIAN:  Merri Brunette, MD   HISTORY OF PRESENTING ILLNESS:  Steven Guerrero 82 y.o. male is a here because of Abnormal protein in blood. The patient was referred by Merri Brunette, MD . The patient presents to the clinic today accompanied by wife.  Patient presents with moderate fatigue and weight loss in the past 2 months, and was seen by primary care physician Dr. [name].  As part of the lab work, SPEP was obtained which showed positive M protein, 0.4.  Both kappa and lambda light chain was started at her visit, ratio was normal.  PSA was normal.  Other labs were unremarkable, including normal CBC and CMP, except mildly elevated creatinine which has been chronic and stable for over 10 years.  Patient denies any significant abdominal pain.  Pt presented in clinic in a wheel chair. Pt had a brain injury and has a hard time walking. He walks with a cane at home. He is able to do his laundry and other shores Pt has pain in his shoulders and he gets injection for it. Pt has loss 20 lbs 3 months ago, and has night sweats.   He has a PMHx of.... GERD CVA DVT HTN   Socially... Married 3 children   REVIEW OF SYSTEMS:   Constitutional: (-) Denies fevers, chills or (+) abnormal night sweats Eyes:(-)  Denies blurriness of vision, double vision or watery eyes Ears, nose, mouth, throat, and face: Denies mucositis or sore throat Respiratory: (-) Denies cough, dyspnea or wheezes Cardiovascular: (-) Denies palpitation, chest discomfort or lower extremity swelling Gastrointestinal:  Denies  nausea, heartburn or change in bowel habits Skin:(-) Denies abnormal skin rashes Lymphatics: Denies new lymphadenopathy or easy bruising Neurological:(-) Denies numbness, tingling or new weaknesses Behavioral/Psych: (-) Mood is stable, no new changes  All other systems were reviewed with the patient and are negative.   MEDICAL HISTORY:  Past Medical History:  Diagnosis Date   Anxiety and depression    Back pain    Brain bleed (HCC)    2003   CVA (cerebral infarction)    2001   Hypercholesteremia    Hypertension    Peripheral neuropathy    Pulmonary embolism (HCC)    Sleep apnea    Stroke (HCC)    2001   TBI (traumatic brain injury) (HCC)     SURGICAL HISTORY: Past Surgical History:  Procedure Laterality Date   HERNIA REPAIR     JOINT REPLACEMENT Right    hip replacement   LEG SURGERY     Broken femur   TOTAL HIP ARTHROPLASTY     Right    SOCIAL HISTORY: Social History   Socioeconomic History   Marital status: Married    Spouse name: Not on file   Number of children: 3   Years of education: HS   Highest education level: Not on file  Occupational History   Occupation: Retired  Tobacco Use   Smoking status: Former    Current packs/day: 0.00    Average packs/day: 1.5 packs/day for 47.0 years (70.5 ttl pk-yrs)    Types: Cigarettes  Start date: 03/25/1959    Quit date: 03/24/2006    Years since quitting: 16.6   Smokeless tobacco: Never  Vaping Use   Vaping status: Never Used  Substance and Sexual Activity   Alcohol use: Yes    Alcohol/week: 0.0 standard drinks of alcohol    Comment: 1 beer per day   Drug use: No   Sexual activity: Not on file  Other Topics Concern   Not on file  Social History Narrative   Lives at home with wife.   Right-handed.   Three children, one living.   2 cups coffee per day.   Social Determinants of Health   Financial Resource Strain: Not on file  Food Insecurity: Not on file  Transportation Needs: Not on file  Physical  Activity: Not on file  Stress: Not on file  Social Connections: Not on file  Intimate Partner Violence: Not on file    FAMILY HISTORY: Family History  Problem Relation Age of Onset   Heart attack Father        Mother - health hx unknown    ALLERGIES:  is allergic to atenolol, gabapentin, lotensin [benazepril hcl], and wellbutrin [bupropion].  MEDICATIONS:  Current Outpatient Medications  Medication Sig Dispense Refill   atorvastatin (LIPITOR) 20 MG tablet Take 20 mg by mouth daily.      citalopram (CELEXA) 40 MG tablet Take 40 mg by mouth at bedtime.      Evolocumab (REPATHA) 140 MG/ML SOSY Inject 140 mg into the skin every 14 (fourteen) days. Tuesday     Ferrous Gluconate-C-Folic Acid (IRON-C PO) Take 1 tablet by mouth at bedtime.     fish oil-omega-3 fatty acids 1000 MG capsule Take 1 g by mouth daily.     ipratropium-albuterol (DUONEB) 0.5-2.5 (3) MG/3ML SOLN Take 3 mLs by nebulization every 6 (six) hours. 360 mL 11   losartan (COZAAR) 100 MG tablet Take 100 mg by mouth daily.     mirtazapine (REMERON) 15 MG tablet Take 15 mg by mouth daily.     Multiple Vitamin (MULTIVITAMIN WITH MINERALS) TABS tablet Take 1 tablet by mouth daily.     pantoprazole (PROTONIX) 40 MG tablet Take 40 mg by mouth daily.      rivaroxaban (XARELTO) 20 MG TABS tablet Take 1 tablet (20 mg total) by mouth daily with supper. (Patient taking differently: Take 10 mg by mouth daily with supper.) 30 tablet 0   vitamin B-12 (CYANOCOBALAMIN) 1000 MCG tablet Take 1,000 mcg by mouth daily.     No current facility-administered medications for this visit.    PHYSICAL EXAMINATION: ECOG PERFORMANCE STATUS: 2 - Symptomatic, <50% confined to bed  Vitals:   10/28/22 0918  BP: 133/66  Pulse: 64  Resp: 18  Temp: 97.7 F (36.5 C)  SpO2: 95%   Filed Weights   10/28/22 0918  Weight: 178 lb (80.7 kg)     GENERAL:alert, no distress and comfortable SKIN: skin color normal, no rashes or significant  lesions EYES: normal, Conjunctiva are pink and non-injected, sclera clear  NEURO:(-) alert & oriented x 3 with fluent speech LUNGS:(-) clear to auscultation and percussion with normal breathing effort HEART: (-) regular rate & rhythm and no murmurs and no lower extremity edema  LABORATORY DATA:  I have reviewed the data as listed    Latest Ref Rng & Units 08/03/2022   10:35 PM 08/03/2022   10:30 PM 09/09/2019   10:59 AM  CBC  WBC 4.0 - 10.5 K/uL  7.4  Hemoglobin 13.0 - 17.0 g/dL 69.6  29.5  28.4   Hematocrit 39.0 - 52.0 % 38.0  39.0  42.0   Platelets 150 - 400 K/uL  216         Latest Ref Rng & Units 08/03/2022   10:35 PM 08/03/2022   10:30 PM 09/09/2019   10:59 AM  CMP  Glucose 70 - 99 mg/dL 132  440    BUN 8 - 23 mg/dL 26  24    Creatinine 1.02 - 1.24 mg/dL 7.25  3.66    Sodium 440 - 145 mmol/L 140  137  142   Potassium 3.5 - 5.1 mmol/L 4.1  4.1  4.4   Chloride 98 - 111 mmol/L 107  106    CO2 22 - 32 mmol/L  20    Calcium 8.9 - 10.3 mg/dL  9.3    Total Protein 6.5 - 8.1 g/dL  7.0    Total Bilirubin 0.3 - 1.2 mg/dL  0.8    Alkaline Phos 38 - 126 U/L  58    AST 15 - 41 U/L  27    ALT 0 - 44 U/L  24       RADIOGRAPHIC STUDIES: I have personally reviewed the radiological images as listed and agreed with the findings in the report. No results found.  ASSESSMENT & PLAN:  Aydenn Campau Abrams is a 82 y.o.  male with a history of    MGUS, IgM type -Due to his reasonably loss, he underwent lab workup, which showed a positive M protein at low level 0.4  slightly elevated IgM level, and elevated both kappa and lambda light chain level. -Patient has chronic CKD over 10 years, stable, no anemia, normal calcium level, no clinical bone pain, a few CT scans in the past few years were negative for bone lesions.  No clinical concern for multiple myeloma -I did not think he needs a bone marrow biopsy -I discussed this is likely MGUS, a benign blood disorder, he has low risk of transforming  to multiple myeloma, or Waldenstrm's macroglobulinemia. -I recommend monitoring      PLAN:  -I reviewed the diagnosis of MGUS and prognosis -lab in 3 months -f/u in 6 months with lab   No orders of the defined types were placed in this encounter.   All questions were answered. The patient knows to call the clinic with any problems, questions or concerns. The total time spent in the appointment was 30 minutes.     Malachy Mood, MD 10/28/2022   I, Monica Martinez, am acting as scribe for Malachy Mood, MD.   I have reviewed the above documentation for accuracy and completeness, and I agree with the above.

## 2022-10-29 NOTE — Progress Notes (Signed)
Faxed Dr. Latanya Maudlin office note to Dr. Merri Brunette w/[facility].  Fax confirmation received.

## 2022-11-28 DIAGNOSIS — Z23 Encounter for immunization: Secondary | ICD-10-CM | POA: Diagnosis not present

## 2022-12-08 NOTE — Progress Notes (Unsigned)
[facility] [address] [place] [address] Phone: [phone] Subjective:    I'm seeing this patient by the request  of:  Merri Brunette, MD  CC:   [id number]  Steven Guerrero is a 82 y.o. male coming in with complaint of L shoulder pain. Patient has been seeing Dr. Denyse Amass. Patient states       Past Medical History:  Diagnosis Date   Anxiety and depression    Back pain    Brain bleed (HCC)    2003   CVA (cerebral infarction)    2001   Hypercholesteremia    Hypertension    Peripheral neuropathy    Pulmonary embolism (HCC)    Sleep apnea    Stroke (HCC)    2001   TBI (traumatic brain injury) (HCC)    Past Surgical History:  Procedure Laterality Date   HERNIA REPAIR     JOINT REPLACEMENT Right    hip replacement   LEG SURGERY     Broken femur   TOTAL HIP ARTHROPLASTY     Right   Social History   Socioeconomic History   Marital status: Married    Spouse name: Not on file   Number of children: 3   Years of education: HS   Highest education level: Not on file  Occupational History   Occupation: Retired  Tobacco Use   Smoking status: Former    Current packs/day: 0.00    Average packs/day: 1.5 packs/day for 47.0 years (70.5 ttl pk-yrs)    Types: Cigarettes    Start date: 03/25/1959    Quit date: 03/24/2006    Years since quitting: 16.7   Smokeless tobacco: Never  Vaping Use   Vaping status: Never Used  Substance and Sexual Activity   Alcohol use: Yes    Alcohol/week: 0.0 standard drinks of alcohol    Comment: 1 beer per day   Drug use: No   Sexual activity: Not on file  Other Topics Concern   Not on file  Social History Narrative   Lives at home with wife.   Right-handed.   Three children, one living.   2 cups coffee per day.   Social Determinants of Health   Financial Resource Strain: Not on file  Food Insecurity: Not on file  Transportation Needs: Not on file  Physical Activity: Not on file  Stress: Not on  file  Social Connections: Not on file   Allergies  Allergen Reactions   Atenolol Other (See Comments)    Does not remember reaction   Gabapentin Other (See Comments)    falls   Lotensin [Benazepril Hcl] Other (See Comments)    Does not remember reaction   Wellbutrin [Bupropion] Other (See Comments)    Does not remember reaction   Family History  Problem Relation Age of Onset   Heart attack Father        Mother - health hx unknown     Current Outpatient Medications (Cardiovascular):    atorvastatin (LIPITOR) 20 MG tablet, Take 20 mg by mouth daily.    Evolocumab (REPATHA) 140 MG/ML SOSY, Inject 140 mg into the skin every 14 (fourteen) days. Tuesday   losartan (COZAAR) 100 MG tablet, Take 100 mg by mouth daily.  Current Outpatient Medications (Respiratory):    ipratropium-albuterol (DUONEB) 0.5-2.5 (3) MG/3ML SOLN, Take 3 mLs by nebulization every 6 (six) hours.   Current Outpatient Medications (Hematological):    Ferrous Gluconate-C-Folic Acid (IRON-C PO), Take 1 tablet by  mouth at bedtime.   rivaroxaban (XARELTO) 20 MG TABS tablet, Take 1 tablet (20 mg total) by mouth daily with supper. (Patient taking differently: Take 10 mg by mouth daily with supper.)   vitamin B-12 (CYANOCOBALAMIN) 1000 MCG tablet, Take 1,000 mcg by mouth daily.  Current Outpatient Medications (Other):    citalopram (CELEXA) 40 MG tablet, Take 40 mg by mouth at bedtime.    fish oil-omega-3 fatty acids 1000 MG capsule, Take 1 g by mouth daily.   mirtazapine (REMERON) 15 MG tablet, Take 15 mg by mouth daily.   Multiple Vitamin (MULTIVITAMIN WITH MINERALS) TABS tablet, Take 1 tablet by mouth daily.   pantoprazole (PROTONIX) 40 MG tablet, Take 40 mg by mouth daily.    Reviewed prior external information including notes and imaging from  primary care provider As well as notes that were available from care everywhere and other healthcare systems.  Past medical history, social, surgical and family history  all reviewed in electronic medical record.  No pertanent information unless stated regarding to the chief complaint.   Review of Systems:  No headache, visual changes, nausea, vomiting, diarrhea, constipation, dizziness, abdominal pain, skin rash, fevers, chills, night sweats, weight loss, swollen lymph nodes, body aches, joint swelling, chest pain, shortness of breath, mood changes. POSITIVE muscle aches  Objective  There were no vitals taken for this visit.   General: No apparent distress alert and oriented x3 mood and affect normal, dressed appropriately.  HEENT: Pupils equal, extraocular movements intact  Respiratory: Patient's speak in full sentences and does not appear short of breath  Cardiovascular: No lower extremity edema, non tender, no erythema      Impression and Recommendations:

## 2022-12-09 ENCOUNTER — Ambulatory Visit: Payer: Medicare Other | Admitting: Family Medicine

## 2022-12-09 ENCOUNTER — Encounter: Payer: Self-pay | Admitting: Family Medicine

## 2022-12-09 ENCOUNTER — Other Ambulatory Visit: Payer: Self-pay

## 2022-12-09 VITALS — BP 116/60 | HR 84 | Ht 71.0 in

## 2022-12-09 DIAGNOSIS — M25512 Pain in left shoulder: Secondary | ICD-10-CM | POA: Diagnosis not present

## 2022-12-09 DIAGNOSIS — G8929 Other chronic pain: Secondary | ICD-10-CM

## 2022-12-09 NOTE — Patient Instructions (Addendum)
Thank you for coming in today.   Recheck in about 3 months.   I can do another injection then if needed.

## 2022-12-18 DIAGNOSIS — R634 Abnormal weight loss: Secondary | ICD-10-CM | POA: Diagnosis not present

## 2023-01-26 ENCOUNTER — Other Ambulatory Visit: Payer: Self-pay

## 2023-01-26 DIAGNOSIS — D472 Monoclonal gammopathy: Secondary | ICD-10-CM

## 2023-01-27 ENCOUNTER — Inpatient Hospital Stay: Payer: Medicare Other | Attending: Hematology

## 2023-01-27 DIAGNOSIS — Z79899 Other long term (current) drug therapy: Secondary | ICD-10-CM | POA: Insufficient documentation

## 2023-01-27 DIAGNOSIS — R779 Abnormality of plasma protein, unspecified: Secondary | ICD-10-CM | POA: Diagnosis not present

## 2023-01-27 DIAGNOSIS — D472 Monoclonal gammopathy: Secondary | ICD-10-CM | POA: Diagnosis not present

## 2023-01-27 DIAGNOSIS — Z8249 Family history of ischemic heart disease and other diseases of the circulatory system: Secondary | ICD-10-CM | POA: Diagnosis not present

## 2023-01-27 LAB — CBC WITH DIFFERENTIAL (CANCER CENTER ONLY)
Abs Immature Granulocytes: 0.02 10*3/uL (ref 0.00–0.07)
Basophils Absolute: 0 10*3/uL (ref 0.0–0.1)
Basophils Relative: 1 %
Eosinophils Absolute: 0.1 10*3/uL (ref 0.0–0.5)
Eosinophils Relative: 2 %
HCT: 38 % — ABNORMAL LOW (ref 39.0–52.0)
Hemoglobin: 13.1 g/dL (ref 13.0–17.0)
Immature Granulocytes: 0 %
Lymphocytes Relative: 27 %
Lymphs Abs: 1.9 10*3/uL (ref 0.7–4.0)
MCH: 32.9 pg (ref 26.0–34.0)
MCHC: 34.5 g/dL (ref 30.0–36.0)
MCV: 95.5 fL (ref 80.0–100.0)
Monocytes Absolute: 0.6 10*3/uL (ref 0.1–1.0)
Monocytes Relative: 9 %
Neutro Abs: 4.3 10*3/uL (ref 1.7–7.7)
Neutrophils Relative %: 61 %
Platelet Count: 217 10*3/uL (ref 150–400)
RBC: 3.98 MIL/uL — ABNORMAL LOW (ref 4.22–5.81)
RDW: 12.8 % (ref 11.5–15.5)
WBC Count: 7 10*3/uL (ref 4.0–10.5)
nRBC: 0 % (ref 0.0–0.2)

## 2023-01-27 LAB — CMP (CANCER CENTER ONLY)
ALT: 15 U/L (ref 0–44)
AST: 19 U/L (ref 15–41)
Albumin: 4.1 g/dL (ref 3.5–5.0)
Alkaline Phosphatase: 64 U/L (ref 38–126)
Anion gap: 7 (ref 5–15)
BUN: 30 mg/dL — ABNORMAL HIGH (ref 8–23)
CO2: 25 mmol/L (ref 22–32)
Calcium: 9.4 mg/dL (ref 8.9–10.3)
Chloride: 107 mmol/L (ref 98–111)
Creatinine: 1.48 mg/dL — ABNORMAL HIGH (ref 0.61–1.24)
GFR, Estimated: 47 mL/min — ABNORMAL LOW (ref >60)
Glucose, Bld: 93 mg/dL (ref 70–99)
Potassium: 3.9 mmol/L (ref 3.5–5.1)
Sodium: 139 mmol/L (ref 135–145)
Total Bilirubin: 1 mg/dL (ref <1.2)
Total Protein: 7 g/dL (ref 6.5–8.1)

## 2023-01-30 ENCOUNTER — Other Ambulatory Visit: Payer: Self-pay | Admitting: Hematology

## 2023-01-30 DIAGNOSIS — D472 Monoclonal gammopathy: Secondary | ICD-10-CM

## 2023-03-09 ENCOUNTER — Ambulatory Visit (INDEPENDENT_AMBULATORY_CARE_PROVIDER_SITE_OTHER): Payer: Medicare Other | Admitting: Family Medicine

## 2023-03-09 ENCOUNTER — Ambulatory Visit (INDEPENDENT_AMBULATORY_CARE_PROVIDER_SITE_OTHER): Payer: Medicare Other

## 2023-03-09 ENCOUNTER — Other Ambulatory Visit: Payer: Self-pay

## 2023-03-09 VITALS — BP 130/64 | HR 69 | Ht 71.0 in

## 2023-03-09 DIAGNOSIS — G8929 Other chronic pain: Secondary | ICD-10-CM

## 2023-03-09 DIAGNOSIS — M25512 Pain in left shoulder: Secondary | ICD-10-CM

## 2023-03-09 DIAGNOSIS — M19012 Primary osteoarthritis, left shoulder: Secondary | ICD-10-CM | POA: Diagnosis not present

## 2023-03-09 DIAGNOSIS — I700 Atherosclerosis of aorta: Secondary | ICD-10-CM | POA: Diagnosis not present

## 2023-03-09 NOTE — Progress Notes (Signed)
Rubin Payor, PhD, LAT, ATC acting as a scribe for Clementeen Graham, MD.  Steven Guerrero is a 82 y.o. male who presents to [facility] today for 12-month f/u chronic L shoulder pain. Pt was last seen by Dr. Denyse Amass on 12/09/22 and was given a L GH steroid injection.  Today, pt reports prior steroid injection did not help or last very long. Pt shoulder is very painful.  He has had good benefits previously from glenohumeral injection.  The most recent injection was also glenohumeral injection.  Last x-ray 2022.  Dx imaging: 11/30/20 L shoulder XR   Pertinent review of systems: No fevers or chills  Relevant historical information: History of stroke History of rotator cuff tear left  Exam:  BP 130/64   Pulse 69   Ht 5\' 11"  (1.803 m)   SpO2 97%   BMI 24.83 kg/m  General: Well Developed, well nourished, and in no acute distress.   MSK: Left shoulder normal-appearing Tender palpation.  Decreased shoulder motion.    Lab and Radiology Results  Procedure: Real-time Ultrasound Guided Injection of left shoulder glenohumeral joint posterior approach Device: Philips Affiniti 50G/GE Logiq Images permanently stored and available for review in PACS Verbal informed consent obtained.  Discussed risks and benefits of procedure. Warned about infection, bleeding, hyperglycemia damage to structures among others. Patient expresses understanding and agreement Time-out conducted.   Noted no overlying erythema, induration, or other signs of local infection.   Skin prepped in a sterile fashion.   Local anesthesia: Topical Ethyl chloride.   With sterile technique and under real time ultrasound guidance: 40 mg of Kenalog and 2 mL's of Marcaine injected into shoulder joint. Fluid seen entering the joint capsule.   Completed without difficulty   Pain immediately resolved suggesting accurate placement of the medication.   Advised to call if fevers/chills, erythema, induration,  drainage, or persistent bleeding.   Images permanently stored and available for review in the ultrasound unit.  Impression: Technically successful ultrasound guided injection.   X-ray images left shoulder obtained today personally and independently interpreted High-riding humeral head indicating chronic rotator cuff tear.  Chronic rotator cuff arthropathy and free changes present.  No acute fractures are visible. Await formal radiology review    Assessment and Plan: 82 y.o. male with chronic left shoulder pain with acute exacerbation.  Previously glenohumeral injections have been pretty effective.  However the last injection 3 months ago did not work very well.  That does seem to be the correct place to put the medicine so we will try a repeat glenohumeral injection today.  If that is not sufficient we can consider a subacromial injection as soon as a week or 2. Unfortunately he is not a good surgical candidate for reverse total shoulder replacement therefore these conservative management options are probably his best option.   PDMP not reviewed this encounter. Orders Placed This Encounter  Procedures   [place] LIMITED JOINT SPACE STRUCTURES UP LEFT(NO LINKED CHARGES)    Reason for Exam (SYMPTOM  OR DIAGNOSIS REQUIRED):   left shoulder pain    Preferred imaging location?:   [facility] Shoulder Left    Standing Status:   Future    Number of Occurrences:   1    Expiration Date:   04/09/2023    Reason for Exam (SYMPTOM  OR DIAGNOSIS REQUIRED):   left shoulder pain    Preferred imaging location?:   [facility]  No orders of the defined types were placed in this encounter.    Discussed warning signs or symptoms. Please see discharge instructions. Patient expresses understanding.   The above documentation has been reviewed and is accurate and complete Clementeen Graham, M.D.

## 2023-03-09 NOTE — Patient Instructions (Addendum)
Thank you for coming in today.   Please get an Xray today before you leave   You received an injection today. Seek immediate medical attention if the joint becomes red, extremely painful, or is oozing fluid.   Check back as needed

## 2023-03-10 ENCOUNTER — Encounter: Payer: Self-pay | Admitting: Cardiovascular Disease

## 2023-03-10 ENCOUNTER — Ambulatory Visit: Payer: Medicare Other | Attending: Cardiovascular Disease | Admitting: Cardiovascular Disease

## 2023-03-10 VITALS — BP 114/78 | HR 58 | Ht 71.5 in | Wt 176.4 lb

## 2023-03-10 DIAGNOSIS — I5032 Chronic diastolic (congestive) heart failure: Secondary | ICD-10-CM

## 2023-03-10 DIAGNOSIS — Z8673 Personal history of transient ischemic attack (TIA), and cerebral infarction without residual deficits: Secondary | ICD-10-CM

## 2023-03-10 DIAGNOSIS — N1832 Chronic kidney disease, stage 3b: Secondary | ICD-10-CM | POA: Diagnosis not present

## 2023-03-10 DIAGNOSIS — Z7901 Long term (current) use of anticoagulants: Secondary | ICD-10-CM | POA: Diagnosis not present

## 2023-03-10 DIAGNOSIS — J4489 Other specified chronic obstructive pulmonary disease: Secondary | ICD-10-CM

## 2023-03-10 DIAGNOSIS — Z86711 Personal history of pulmonary embolism: Secondary | ICD-10-CM | POA: Diagnosis not present

## 2023-03-10 DIAGNOSIS — E785 Hyperlipidemia, unspecified: Secondary | ICD-10-CM

## 2023-03-10 DIAGNOSIS — J439 Emphysema, unspecified: Secondary | ICD-10-CM | POA: Diagnosis not present

## 2023-03-10 DIAGNOSIS — I10 Essential (primary) hypertension: Secondary | ICD-10-CM | POA: Diagnosis not present

## 2023-03-10 DIAGNOSIS — I714 Abdominal aortic aneurysm, without rupture, unspecified: Secondary | ICD-10-CM
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
## 2023-03-10 NOTE — Patient Instructions (Signed)
Medication Instructions:  The current medical regimen is effective;  continue present plan and medications as directed. Please refer to the Current Medication list given to you today.   *If you need a refill on your cardiac medications before your next appointment, please call your pharmacy*   Lab Work: RETURN FOR FASTING LABS If you have labs (blood work) drawn today and your tests are completely normal, you will receive your results only by: MyChart Message (if you have MyChart) OR A paper copy in the mail If you have any lab test that is abnormal or we need to change your treatment, we will call you to review the results.   Testing/Procedures: No procedures ordered today.    Follow-Up: At [facility], you and your health needs are our priority.  As part of our continuing mission to provide you with exceptional heart care, we have created designated Provider Care Teams.  These Care Teams include your primary Cardiologist (physician) and Advanced Practice Providers (APPs -  Physician Assistants and Nurse Practitioners) who all work together to provide you with the care you need, when you need it.  We recommend signing up for the patient portal called "MyChart".  Sign up information is provided on this After Visit Summary.  MyChart is used to connect with patients for Virtual Visits (Telemedicine).  Patients are able to view lab/test results, encounter notes, upcoming appointments, etc.  Non-urgent messages can be sent to your provider as well.   To learn more about what you can do with MyChart, go to [email or web address].    Your next appointment:   6 month(s)  Provider:   Edd Fabian, FNP

## 2023-03-12 ENCOUNTER — Encounter: Payer: Self-pay | Admitting: Cardiovascular Disease

## 2023-03-23 NOTE — Progress Notes (Signed)
Left shoulder x-ray looks okay to radiology.  There is a little bit of arthritis but nothing terrible looking.

## 2023-03-26 ENCOUNTER — Ambulatory Visit: Payer: Medicare Other | Admitting: Family Medicine

## 2023-04-13 ENCOUNTER — Ambulatory Visit (HOSPITAL_COMMUNITY)
Admission: RE | Admit: 2023-04-13 | Discharge: 2023-04-13 | Disposition: A | Discharge status: Home / Self Care | Payer: Medicare Other | Source: Ambulatory Visit | Attending: Cardiovascular Disease | Admitting: Cardiovascular Disease

## 2023-04-13 DIAGNOSIS — I714 Abdominal aortic aneurysm, without rupture, unspecified: Secondary | ICD-10-CM | POA: Insufficient documentation

## 2023-04-13 DIAGNOSIS — I7143 Infrarenal abdominal aortic aneurysm, without rupture: Secondary | ICD-10-CM | POA: Insufficient documentation

## 2023-04-14 ENCOUNTER — Inpatient Hospital Stay: Payer: Medicare Other | Attending: Hematology

## 2023-04-14 DIAGNOSIS — R779 Abnormality of plasma protein, unspecified: Secondary | ICD-10-CM | POA: Insufficient documentation

## 2023-04-14 DIAGNOSIS — D472 Monoclonal gammopathy: Secondary | ICD-10-CM | POA: Diagnosis not present

## 2023-04-14 DIAGNOSIS — Z79899 Other long term (current) drug therapy: Secondary | ICD-10-CM | POA: Diagnosis not present

## 2023-04-14 LAB — CBC WITH DIFFERENTIAL/PLATELET
Abs Immature Granulocytes: 0.03 10*3/uL (ref 0.00–0.07)
Basophils Absolute: 0 10*3/uL (ref 0.0–0.1)
Basophils Relative: 0 %
Eosinophils Absolute: 0.1 10*3/uL (ref 0.0–0.5)
Eosinophils Relative: 2 %
HCT: 41.1 % (ref 39.0–52.0)
Hemoglobin: 13.7 g/dL (ref 13.0–17.0)
Immature Granulocytes: 0 %
Lymphocytes Relative: 22 %
Lymphs Abs: 1.5 10*3/uL (ref 0.7–4.0)
MCH: 32.7 pg (ref 26.0–34.0)
MCHC: 33.3 g/dL (ref 30.0–36.0)
MCV: 98.1 fL (ref 80.0–100.0)
Monocytes Absolute: 0.5 10*3/uL (ref 0.1–1.0)
Monocytes Relative: 7 %
Neutro Abs: 4.6 10*3/uL (ref 1.7–7.7)
Neutrophils Relative %: 69 %
Platelets: 212 10*3/uL (ref 150–400)
RBC: 4.19 MIL/uL — ABNORMAL LOW (ref 4.22–5.81)
RDW: 12.8 % (ref 11.5–15.5)
WBC: 6.8 10*3/uL (ref 4.0–10.5)
nRBC: 0 % (ref 0.0–0.2)

## 2023-04-14 LAB — COMPREHENSIVE METABOLIC PANEL
ALT: 13 U/L (ref 0–44)
AST: 19 U/L (ref 15–41)
Albumin: 4.3 g/dL (ref 3.5–5.0)
Alkaline Phosphatase: 61 U/L (ref 38–126)
Anion gap: 7 (ref 5–15)
BUN: 34 mg/dL — ABNORMAL HIGH (ref 8–23)
CO2: 26 mmol/L (ref 22–32)
Calcium: 9.5 mg/dL (ref 8.9–10.3)
Chloride: 107 mmol/L (ref 98–111)
Creatinine, Ser: 1.35 mg/dL — ABNORMAL HIGH (ref 0.61–1.24)
GFR, Estimated: 52 mL/min — ABNORMAL LOW (ref >60)
Glucose, Bld: 84 mg/dL (ref 70–99)
Potassium: 4.3 mmol/L (ref 3.5–5.1)
Sodium: 140 mmol/L (ref 135–145)
Total Bilirubin: 1.1 mg/dL (ref 0.0–1.2)
Total Protein: 7.3 g/dL (ref 6.5–8.1)

## 2023-04-15 LAB — KAPPA/LAMBDA LIGHT CHAINS
Kappa free light chain: 34.4 mg/L — ABNORMAL HIGH (ref 3.3–19.4)
Kappa, lambda light chain ratio: 1.13 (ref 0.26–1.65)
Lambda free light chains: 30.5 mg/L — ABNORMAL HIGH (ref 5.7–26.3)

## 2023-04-15 NOTE — Progress Notes (Signed)
Myeloma panel pending

## 2023-04-19 LAB — MULTIPLE MYELOMA PANEL, SERUM
Albumin SerPl Elph-Mcnc: 3.6 g/dL (ref 2.9–4.4)
Albumin/Glob SerPl: 1.2 (ref 0.7–1.7)
Alpha 1: 0.3 g/dL (ref 0.0–0.4)
Alpha2 Glob SerPl Elph-Mcnc: 0.7 g/dL (ref 0.4–1.0)
B-Globulin SerPl Elph-Mcnc: 1.1 g/dL (ref 0.7–1.3)
Gamma Glob SerPl Elph-Mcnc: 1.1 g/dL (ref 0.4–1.8)
Globulin, Total: 3.2 g/dL (ref 2.2–3.9)
IgA: 357 mg/dL (ref 61–437)
IgG (Immunoglobin G), Serum: 1011 mg/dL (ref 603–1613)
IgM (Immunoglobulin M), Srm: 221 mg/dL — ABNORMAL HIGH (ref 15–143)
Total Protein ELP: 6.8 g/dL (ref 6.0–8.5)

## 2023-04-28 ENCOUNTER — Inpatient Hospital Stay: Payer: Medicare Other | Attending: Hematology | Admitting: Hematology

## 2023-04-28 VITALS — BP 141/67 | HR 68 | Temp 98.7°F | Resp 18 | Ht 71.5 in | Wt 182.1 lb

## 2023-04-28 DIAGNOSIS — Z8673 Personal history of transient ischemic attack (TIA), and cerebral infarction without residual deficits: Secondary | ICD-10-CM | POA: Insufficient documentation

## 2023-04-28 DIAGNOSIS — D472 Monoclonal gammopathy: Secondary | ICD-10-CM | POA: Diagnosis not present

## 2023-04-28 DIAGNOSIS — N189 Chronic kidney disease, unspecified: Secondary | ICD-10-CM | POA: Diagnosis not present

## 2023-04-28 DIAGNOSIS — E785 Hyperlipidemia, unspecified: Secondary | ICD-10-CM | POA: Diagnosis not present

## 2023-04-28 DIAGNOSIS — Z888 Allergy status to other drugs, medicaments and biological substances status: Secondary | ICD-10-CM | POA: Insufficient documentation

## 2023-04-28 DIAGNOSIS — Z87820 Personal history of traumatic brain injury: Secondary | ICD-10-CM | POA: Insufficient documentation

## 2023-04-28 DIAGNOSIS — I129 Hypertensive chronic kidney disease with stage 1 through stage 4 chronic kidney disease, or unspecified chronic kidney disease: Secondary | ICD-10-CM | POA: Insufficient documentation

## 2023-04-28 DIAGNOSIS — Z86711 Personal history of pulmonary embolism: Secondary | ICD-10-CM | POA: Insufficient documentation

## 2023-04-28 DIAGNOSIS — Z7901 Long term (current) use of anticoagulants: Secondary | ICD-10-CM | POA: Insufficient documentation

## 2023-04-28 DIAGNOSIS — R768 Other specified abnormal immunological findings in serum: Secondary | ICD-10-CM | POA: Diagnosis not present

## 2023-04-28 DIAGNOSIS — Z79899 Other long term (current) drug therapy: Secondary | ICD-10-CM | POA: Insufficient documentation

## 2023-04-28 DIAGNOSIS — R779 Abnormality of plasma protein, unspecified: Secondary | ICD-10-CM | POA: Insufficient documentation

## 2023-04-28 DIAGNOSIS — I4891 Unspecified atrial fibrillation: Secondary | ICD-10-CM | POA: Diagnosis not present

## 2023-04-28 NOTE — Assessment & Plan Note (Addendum)
-  Due to his weight loss, he underwent lab workup in 10/2022, which showed a positive M protein at low level 0.4  slightly elevated IgM level, and elevated both kappa and lambda light chain level.  -Patient has chronic CKD over 10 years, stable, no anemia, normal calcium  level, no clinical bone pain, a few CT scans in the past few years were negative for bone lesions.  No clinical concern for multiple myeloma  -Repeat labs on April 14, 2023 which showed negative M protein and slightly elevated IgM level, immunofixation showed polyclonal increase in 1 normal immunoglobulin.  Kappa and lambda light chain level were both slightly elevated, with normal ratio. -continue lab monitoring

## 2023-04-28 NOTE — Progress Notes (Signed)
 [facility]   [phone] [phone]   Clinic Follow up Note   Patient Care Team: Clarice Nottingham, MD as PCP - General (Internal Medicine) Burnard Debby LABOR, MD as PCP - Cardiology (Cardiology) Marquette Ozell BIRCH, DO as Consulting Physician (Family Medicine)  Date of Service:  04/28/2023  CHIEF COMPLAINT: f/u of MGUS  CURRENT THERAPY:  Monitoring  Oncology History   MGUS (monoclonal gammopathy of unknown significance) -Due to his weight loss, he underwent lab workup in 10/2022, which showed a positive M protein at low level 0.4  slightly elevated IgM level, and elevated both kappa and lambda light chain level.  -Patient has chronic CKD over 10 years, stable, no anemia, normal calcium  level, no clinical bone pain, a few CT scans in the past few years were negative for bone lesions.  No clinical concern for multiple myeloma  -Repeat labs on April 14, 2023 which showed negative M protein and slightly elevated IgM level, immunofixation showed polyclonal increase in 1 normal immunoglobulin.  Kappa and lambda light chain level were both slightly elevated, with normal ratio. -continue lab monitoring    Assessment and Plan    Monoclonal Gammopathy of Undetermined Significance (MGUS) Recent labs show no monoclonal protein, indicating a negative result. Slightly elevated IgM levels and polyclonal increased immunoglobulin suggest nonspecific inflammation or possible kidney disease. Light chain levels are slightly elevated, but the ratio is normal. Findings are nonspecific and not indicative of a serious condition. Future labs may negate the need for follow-up. - Repeat labs in one year - Schedule follow-up visit in one year, with labs done one to two weeks prior - Offer phone visit to discuss lab results to minimize travel  Chronic Kidney Disease Chronic kidney disease is well-managed with no worsening noted in recent labs. - Continue monitoring kidney  function  Atrial Fibrillation Atrial fibrillation managed with Xarelto . - Continue Xarelto  as prescribed  Hyperlipidemia Hyperlipidemia managed with Repatha, fish oil, and multivitamin. - Continue Repatha, fish oil, and multivitamin as prescribed  Plan -Lab reviewed, negative M protein -Will repeat lab in 1 year, follow-up with a phone visit.  If M protein negative again on next lab, then we will release him from our clinic.     Discussed the use of AI scribe software for clinical note transcription with the patient, who gave verbal consent to proceed.  History of Present Illness   The patient, an 83 year old with a history of MGUS, presents for a routine follow-up. He reports no new symptoms or changes in health over the past six months. He has gained some weight, which is a positive change as he had previously experienced unexplained weight loss and reduced appetite. The patient denies any new or ongoing pain. He has a history of chronic kidney disease, which is stable and not worsening. He also has a history of atrial fibrillation, for which he is on Xarelto , a blood thinner. He is also on a cholesterol medication, Repatha, and takes a multivitamin, B12, and fish oil.         All other systems were reviewed with the patient and are negative.  MEDICAL HISTORY:  Past Medical History:  Diagnosis Date   Anxiety and depression    Back pain    Brain bleed (HCC)    2003   CVA (cerebral infarction)    2001   Hypercholesteremia    Hypertension    Peripheral neuropathy    Pulmonary embolism (HCC)    Sleep apnea  Stroke [facility])    2001   TBI (traumatic brain injury) [facility])     SURGICAL HISTORY: Past Surgical History:  Procedure Laterality Date   HERNIA REPAIR     JOINT REPLACEMENT Right    hip replacement   LEG SURGERY     Broken femur   TOTAL HIP ARTHROPLASTY     Right    I have reviewed the social history and family history with the patient and they are unchanged  from previous note.  ALLERGIES:  is allergic to atenolol, gabapentin , lotensin [benazepril hcl], and wellbutrin [bupropion].  MEDICATIONS:  Current Outpatient Medications  Medication Sig Dispense Refill   atorvastatin  (LIPITOR) 20 MG tablet Take 20 mg by mouth daily.      citalopram  (CELEXA ) 40 MG tablet Take 40 mg by mouth at bedtime.      Evolocumab (REPATHA) 140 MG/ML SOSY Inject 140 mg into the skin every 14 (fourteen) days. Tuesday     Ferrous Gluconate-C-Folic Acid (IRON-C PO) Take 1 tablet by mouth at bedtime.     fish oil-omega-3 fatty acids 1000 MG capsule Take 1 g by mouth daily.     ipratropium-albuterol  (DUONEB) 0.5-2.5 (3) MG/3ML SOLN Take 3 mLs by nebulization every 6 (six) hours. 360 mL 11   losartan  (COZAAR ) 100 MG tablet Take 100 mg by mouth daily.     mirtazapine  (REMERON ) 15 MG tablet Take 15 mg by mouth daily.     Multiple Vitamin (MULTIVITAMIN WITH MINERALS) TABS tablet Take 1 tablet by mouth daily.     pantoprazole  (PROTONIX ) 40 MG tablet Take 40 mg by mouth daily.      rivaroxaban  (XARELTO ) 20 MG TABS tablet Take 1 tablet (20 mg total) by mouth daily with supper. (Patient taking differently: Take 10 mg by mouth daily with supper.) 30 tablet 0   vitamin B-12 (CYANOCOBALAMIN ) 1000 MCG tablet Take 1,000 mcg by mouth daily.     No current facility-administered medications for this visit.    PHYSICAL EXAMINATION: ECOG PERFORMANCE STATUS: 2 - Symptomatic, <50% confined to bed  Vitals:   04/28/23 1344 04/28/23 1345  BP: (!) 153/67 (!) 141/67  Pulse: 68   Resp: 18   Temp: 98.7 F (37.1 C)   SpO2: 97%    Wt Readings from Last 3 Encounters:  04/28/23 182 lb 2 oz (82.6 kg)  03/10/23 176 lb 6.4 oz (80 kg)  10/28/22 178 lb (80.7 kg)     GENERAL:alert, no distress and comfortable SKIN: skin color, texture, turgor are normal, no rashes or significant lesions EYES: normal, Conjunctiva are pink and non-injected, sclera clear Musculoskeletal:no cyanosis of digits and  no clubbing  NEURO: alert & oriented x 3 with fluent speech, no focal motor/sensory deficits   LABORATORY DATA:  I have reviewed the data as listed    Latest Ref Rng & Units 04/14/2023    1:41 PM 01/27/2023    1:28 PM 08/03/2022   10:35 PM  CBC  WBC 4.0 - 10.5 K/uL 6.8  7.0    Hemoglobin 13.0 - 17.0 g/dL 86.2  86.8  87.0   Hematocrit 39.0 - 52.0 % 41.1  38.0  38.0   Platelets 150 - 400 K/uL 212  217          Latest Ref Rng & Units 04/14/2023    1:41 PM 01/27/2023    1:28 PM 08/03/2022   10:35 PM  CMP  Glucose 70 - 99 mg/dL 84  93  885   BUN 8 - 23  mg/dL 34  30  26   Creatinine 0.61 - 1.24 mg/dL 8.64  8.51  8.39   Sodium 135 - 145 mmol/L 140  139  140   Potassium 3.5 - 5.1 mmol/L 4.3  3.9  4.1   Chloride 98 - 111 mmol/L 107  107  107   CO2 22 - 32 mmol/L 26  25    Calcium  8.9 - 10.3 mg/dL 9.5  9.4    Total Protein 6.5 - 8.1 g/dL 7.3  7.0    Total Bilirubin 0.0 - 1.2 mg/dL 1.1  1.0    Alkaline Phos 38 - 126 U/L 61  64    AST 15 - 41 U/L 19  19    ALT 0 - 44 U/L 13  15        RADIOGRAPHIC STUDIES: I have personally reviewed the radiological images as listed and agreed with the findings in the report. No results found.    No orders of the defined types were placed in this encounter.  All questions were answered. The patient knows to call the clinic with any problems, questions or concerns. No barriers to learning was detected. The total time spent in the appointment was 15 minutes.     Onita Mattock, MD 04/28/2023

## 2023-04-29 ENCOUNTER — Other Ambulatory Visit: Payer: Self-pay

## 2023-04-29 ENCOUNTER — Encounter (HOSPITAL_COMMUNITY): Payer: Self-pay | Admitting: Emergency Medicine

## 2023-04-29 ENCOUNTER — Emergency Department (HOSPITAL_COMMUNITY)
Admission: EM | Admit: 2023-04-29 | Discharge: 2023-04-30 | Disposition: A | Discharge status: Home / Self Care | Payer: Medicare Other | Attending: Emergency Medicine | Admitting: Emergency Medicine

## 2023-04-29 DIAGNOSIS — R55 Syncope and collapse: Secondary | ICD-10-CM | POA: Insufficient documentation

## 2023-04-29 DIAGNOSIS — J929 Pleural plaque without asbestos: Secondary | ICD-10-CM | POA: Diagnosis not present

## 2023-04-29 DIAGNOSIS — R7989 Other specified abnormal findings of blood chemistry: Secondary | ICD-10-CM | POA: Insufficient documentation

## 2023-04-29 DIAGNOSIS — N289 Disorder of kidney and ureter, unspecified: Secondary | ICD-10-CM | POA: Diagnosis not present

## 2023-04-29 DIAGNOSIS — I6523 Occlusion and stenosis of bilateral carotid arteries: Secondary | ICD-10-CM | POA: Diagnosis not present

## 2023-04-29 DIAGNOSIS — Y92009 Unspecified place in unspecified non-institutional (private) residence as the place of occurrence of the external cause: Secondary | ICD-10-CM | POA: Diagnosis not present

## 2023-04-29 DIAGNOSIS — Z743 Need for continuous supervision: Secondary | ICD-10-CM | POA: Diagnosis not present

## 2023-04-29 DIAGNOSIS — D509 Iron deficiency anemia, unspecified: Secondary | ICD-10-CM | POA: Diagnosis not present

## 2023-04-29 DIAGNOSIS — D649 Anemia, unspecified: Secondary | ICD-10-CM | POA: Diagnosis not present

## 2023-04-29 DIAGNOSIS — R000 Tachycardia, unspecified: Secondary | ICD-10-CM | POA: Diagnosis not present

## 2023-04-29 DIAGNOSIS — W06XXXA Fall from bed, initial encounter: Secondary | ICD-10-CM | POA: Diagnosis not present

## 2023-04-29 DIAGNOSIS — S199XXA Unspecified injury of neck, initial encounter: Secondary | ICD-10-CM | POA: Diagnosis not present

## 2023-04-29 DIAGNOSIS — R0689 Other abnormalities of breathing: Secondary | ICD-10-CM | POA: Diagnosis not present

## 2023-04-29 DIAGNOSIS — I129 Hypertensive chronic kidney disease with stage 1 through stage 4 chronic kidney disease, or unspecified chronic kidney disease: Secondary | ICD-10-CM | POA: Diagnosis not present

## 2023-04-29 DIAGNOSIS — G319 Degenerative disease of nervous system, unspecified: Secondary | ICD-10-CM | POA: Diagnosis not present

## 2023-04-29 DIAGNOSIS — Z7901 Long term (current) use of anticoagulants: Secondary | ICD-10-CM | POA: Diagnosis not present

## 2023-04-29 DIAGNOSIS — R6889 Other general symptoms and signs: Secondary | ICD-10-CM | POA: Diagnosis not present

## 2023-04-29 DIAGNOSIS — J449 Chronic obstructive pulmonary disease, unspecified: Secondary | ICD-10-CM | POA: Diagnosis not present

## 2023-04-29 DIAGNOSIS — R404 Transient alteration of awareness: Secondary | ICD-10-CM | POA: Diagnosis not present

## 2023-04-29 DIAGNOSIS — S0990XA Unspecified injury of head, initial encounter: Secondary | ICD-10-CM | POA: Diagnosis not present

## 2023-04-29 DIAGNOSIS — I499 Cardiac arrhythmia, unspecified: Secondary | ICD-10-CM | POA: Diagnosis not present

## 2023-04-29 DIAGNOSIS — Z79899 Other long term (current) drug therapy: Secondary | ICD-10-CM | POA: Insufficient documentation

## 2023-04-29 DIAGNOSIS — I10 Essential (primary) hypertension: Secondary | ICD-10-CM | POA: Insufficient documentation

## 2023-04-29 DIAGNOSIS — I700 Atherosclerosis of aorta: Secondary | ICD-10-CM | POA: Diagnosis not present

## 2023-04-29 NOTE — ED Provider Notes (Signed)
 [place] [facility] Provider Note   CSN: [id number] Arrival date & time: 04/29/23  2336     History  Chief Complaint  Patient presents with   Loss of Consciousness    Steven Guerrero is a 83 y.o. male.  The history is provided by the patient.  Loss of Consciousness He has history of hypertension, stroke, pulmonary embolism anticoagulated on rivaroxaban , COPD and comes in following a fall at home.  He states that he fell getting out of bed.  Family states that he was confused initially, and they were not able to get him off the floor.  He is usually ambulatory with the aid of a walker.  EMS is reported to have noted low blood pressure and hypoxia as well as diaphoresis and pallor-initial blood pressure 80/50.  He has had a mild cough for the last 2 days.  He denies injury from the fall.  Per family, he is back to his baseline mental status.   Home Medications Prior to Admission medications   Medication Sig Start Date End Date Taking? Authorizing Provider  atorvastatin  (LIPITOR) 20 MG tablet Take 20 mg by mouth daily.     [provider]  citalopram  (CELEXA ) 40 MG tablet Take 40 mg by mouth at bedtime.     [provider]  Evolocumab (REPATHA) 140 MG/ML SOSY Inject 140 mg into the skin every 14 (fourteen) days. Tuesday    [provider]  Ferrous Gluconate-C-Folic Acid (IRON-C PO) Take 1 tablet by mouth at bedtime.    [provider]  fish oil-omega-3 fatty acids 1000 MG capsule Take 1 g by mouth daily.    [provider]  ipratropium-albuterol  (DUONEB) 0.5-2.5 (3) MG/3ML SOLN Take 3 mLs by nebulization every 6 (six) hours. 12/24/20   Kassie Acquanetta Bradley, MD  losartan  (COZAAR ) 100 MG tablet Take 100 mg by mouth daily. 07/16/19   [provider]  mirtazapine  (REMERON ) 15 MG tablet Take 15 mg by mouth daily. 11/30/20   [provider]  Multiple Vitamin (MULTIVITAMIN WITH MINERALS) TABS tablet Take 1  tablet by mouth daily.    [provider]  pantoprazole  (PROTONIX ) 40 MG tablet Take 40 mg by mouth daily.  02/15/16   [provider]  rivaroxaban  (XARELTO ) 20 MG TABS tablet Take 1 tablet (20 mg total) by mouth daily with supper. Patient taking differently: Take 10 mg by mouth daily with supper. 04/05/18   Vann, Jessica U, DO  vitamin B-12 (CYANOCOBALAMIN ) 1000 MCG tablet Take 1,000 mcg by mouth daily.    [provider]      Allergies    Atenolol, Gabapentin , Lotensin [benazepril hcl], and Wellbutrin [bupropion]    Review of Systems   Review of Systems  Cardiovascular:  Positive for syncope.  All other systems reviewed and are negative.   Physical Exam Updated Vital Signs BP 131/71 (BP Location: Right Arm)   Pulse (!) 107   Temp 98.2 F (36.8 C) (Oral)   Resp (!) 29   Wt 83.9 kg   SpO2 98%   BMI 25.44 kg/m  Physical Exam Vitals and nursing note reviewed.  83 year old male, resting comfortably and in no acute distress. Vital signs are significant for elevated heart rate and respiratory rate. Oxygen saturation is 98%, which is normal. Head is normocephalic and atraumatic. PERRLA, EOMI. Oropharynx is clear. Neck is nontender. Back is nontender. Lungs are clear without rales, wheezes, or rhonchi. Chest is nontender. Heart has  regular rate and rhythm without murmur. Abdomen is soft, flat, nontender. Extremities have no cyanosis or edema, full range of motion is present. Skin is warm and dry without rash. Neurologic: Awake and alert, cranial nerves are intact, moves all extremities equally.  ED Results / Procedures / Treatments   Labs (all labs ordered are listed, but only abnormal results are displayed) Labs Reviewed  CBC WITH DIFFERENTIAL/PLATELET - Abnormal; Notable for the following components:      Result Value   RBC 3.99 (*)    Hemoglobin 12.8 (*)    HCT 37.9 (*)    Neutro Abs 8.9 (*)    Lymphs Abs 0.4 (*)    All other components within  normal limits  PROTIME-INR - Abnormal; Notable for the following components:   Prothrombin Time 21.5 (*)    INR 1.8 (*)    All other components within normal limits  I-STAT CG4 LACTIC ACID, ED - Abnormal; Notable for the following components:   Lactic Acid, Venous 2.5 (*)    All other components within normal limits  CULTURE, BLOOD (ROUTINE X 2)  CULTURE, BLOOD (ROUTINE X 2)  COMPREHENSIVE METABOLIC PANEL  URINALYSIS, W/ REFLEX TO CULTURE (INFECTION SUSPECTED)    EKG EKG Interpretation Date/Time:  Wednesday April 29 2023 23:45:42 EST Ventricular Rate:  107 PR Interval:  148 QRS Duration:  130 QT Interval:  353 QTC Calculation: 471 R Axis:   36  Text Interpretation: Sinus tachycardia Right bundle branch block LOW VOLTAGE When compared with ECG of 03/10/2023 HEART RATE has increased Confirmed by Raford Lenis ([id number]) on 04/29/2023 11:49:40 PM  Radiology DG Chest 2 View Result Date: 04/30/2023 CLINICAL DATA:  Suspected sepsis EXAM: CHEST - 2 VIEW COMPARISON:  08/03/2022, CT 11/24/2020 FINDINGS: Multiple calcified bilateral pleural plaques. No consolidation or effusion. Mild chronic reticular opacity at the bases. Stable cardiomediastinal silhouette with aortic atherosclerosis. No pneumothorax IMPRESSION: 1. No active cardiopulmonary disease. 2. Multiple calcified bilateral pleural plaques. Chronic interstitial changes at the bases Electronically Signed   By: Luke Bun M.D.   On: 04/30/2023 00:09    Procedures Procedures  Cardiac monitor shows sinus tachycardia, per my interpretation.  Medications Ordered in ED Medications - No data to display  ED Course/ Medical Decision Making/ A&P                                 Medical Decision Making Amount and/or Complexity of Data Reviewed Labs: ordered. Radiology: ordered.   Fall inpatient to is anticoagulated, no apparent injury from fall.  Report of initial confusion and hypotension.  He is noted to be tachypneic and  tachycardic.  He was warm to the touch, I am requesting repeat temperature (family states that EMS reported temperature 101).  I have reviewed his electrocardiogram, and my interpretation is sinus tachycardia, right bundle branch block not significantly changed from prior.  Chest x-ray shows no acute process.  I have independently viewed the images, and agree with the radiologist's interpretation.  I have reviewed his laboratory tests, and my interpretation is mild anemia with 1 g drop in hemoglobin compared with 04/14/2023, elevated lactic acid level of uncertain cause.  I have ordered IV fluids.  Metabolic panel and urinalysis are pending.  Because of fall while anticoagulated, I have also ordered CT of head and cervical spine.  Repeat lactic acid level is well within the normal range.  I have reviewed his urinalysis, and my interpretation  is no evidence of urinary tract infection.  CT of head and cervical spine showed no acute findings.  I have independently viewed the images, and agree with the radiologist's interpretation.  I ordered orthostatic vital signs.  Patient was not able to stand, but there is no change in blood pressure from laying to sitting.  He continues with normal mental status in the emergency department.  He is safe for discharge.  CRITICAL CARE Performed by: Alm Lias Total critical care time: 35 minutes Critical care time was exclusive of separately billable procedures and treating other patients. Critical care was necessary to treat or prevent imminent or life-threatening deterioration. Critical care was time spent personally by me on the following activities: development of treatment plan with patient and/or surrogate as well as nursing, discussions with consultants, evaluation of patient's response to treatment, examination of patient, obtaining history from patient or surrogate, ordering and performing treatments and interventions, ordering and review of laboratory studies,  ordering and review of radiographic studies, pulse oximetry and re-evaluation of patient's condition.  Final Clinical Impression(s) / ED Diagnoses Final diagnoses:  Fall from bed, initial encounter  Chronic anticoagulation  Renal insufficiency  Normochromic normocytic anemia  Elevated lactic acid level    Rx / DC Orders ED Discharge Orders     None         Lias Alm, MD 04/30/23 [phone]

## 2023-04-29 NOTE — ED Triage Notes (Signed)
 Patient BIB [facility] c/o syncopal episode while using the restroom this evening.  EMS reports patient was hypoxic upon their arrival, pale, diaphoretic, with BP of 80/50.    18 L AC

## 2023-04-30 ENCOUNTER — Emergency Department (HOSPITAL_COMMUNITY): Payer: Medicare Other

## 2023-04-30 DIAGNOSIS — I700 Atherosclerosis of aorta: Secondary | ICD-10-CM | POA: Diagnosis not present

## 2023-04-30 DIAGNOSIS — S199XXA Unspecified injury of neck, initial encounter: Secondary | ICD-10-CM | POA: Diagnosis not present

## 2023-04-30 DIAGNOSIS — G319 Degenerative disease of nervous system, unspecified: Secondary | ICD-10-CM | POA: Diagnosis not present

## 2023-04-30 DIAGNOSIS — I6523 Occlusion and stenosis of bilateral carotid arteries: Secondary | ICD-10-CM | POA: Diagnosis not present

## 2023-04-30 DIAGNOSIS — S0990XA Unspecified injury of head, initial encounter: Secondary | ICD-10-CM | POA: Diagnosis not present

## 2023-04-30 DIAGNOSIS — J929 Pleural plaque without asbestos: Secondary | ICD-10-CM | POA: Diagnosis not present

## 2023-04-30 LAB — CBC WITH DIFFERENTIAL/PLATELET
Abs Immature Granulocytes: 0.06 10*3/uL (ref 0.00–0.07)
Basophils Absolute: 0 10*3/uL (ref 0.0–0.1)
Basophils Relative: 0 %
Eosinophils Absolute: 0 10*3/uL (ref 0.0–0.5)
Eosinophils Relative: 0 %
HCT: 37.9 % — ABNORMAL LOW (ref 39.0–52.0)
Hemoglobin: 12.8 g/dL — ABNORMAL LOW (ref 13.0–17.0)
Immature Granulocytes: 1 %
Lymphocytes Relative: 4 %
Lymphs Abs: 0.4 10*3/uL — ABNORMAL LOW (ref 0.7–4.0)
MCH: 32.1 pg (ref 26.0–34.0)
MCHC: 33.8 g/dL (ref 30.0–36.0)
MCV: 95 fL (ref 80.0–100.0)
Monocytes Absolute: 0.5 10*3/uL (ref 0.1–1.0)
Monocytes Relative: 5 %
Neutro Abs: 8.9 10*3/uL — ABNORMAL HIGH (ref 1.7–7.7)
Neutrophils Relative %: 90 %
Platelets: 160 10*3/uL (ref 150–400)
RBC: 3.99 MIL/uL — ABNORMAL LOW (ref 4.22–5.81)
RDW: 12.9 % (ref 11.5–15.5)
WBC: 9.9 10*3/uL (ref 4.0–10.5)
nRBC: 0 % (ref 0.0–0.2)

## 2023-04-30 LAB — COMPREHENSIVE METABOLIC PANEL
ALT: 18 U/L (ref 0–44)
AST: 25 U/L (ref 15–41)
Albumin: 3.4 g/dL — ABNORMAL LOW (ref 3.5–5.0)
Alkaline Phosphatase: 60 U/L (ref 38–126)
Anion gap: 12 (ref 5–15)
BUN: 22 mg/dL (ref 8–23)
CO2: 21 mmol/L — ABNORMAL LOW (ref 22–32)
Calcium: 9.3 mg/dL (ref 8.9–10.3)
Chloride: 104 mmol/L (ref 98–111)
Creatinine, Ser: 1.41 mg/dL — ABNORMAL HIGH (ref 0.61–1.24)
GFR, Estimated: 49 mL/min — ABNORMAL LOW (ref >60)
Glucose, Bld: 170 mg/dL — ABNORMAL HIGH (ref 70–99)
Potassium: 3.7 mmol/L (ref 3.5–5.1)
Sodium: 137 mmol/L (ref 135–145)
Total Bilirubin: 0.9 mg/dL (ref 0.0–1.2)
Total Protein: 6.6 g/dL (ref 6.5–8.1)

## 2023-04-30 LAB — PROTIME-INR
INR: 1.8 — ABNORMAL HIGH (ref 0.8–1.2)
Prothrombin Time: 21.5 s — ABNORMAL HIGH (ref 11.4–15.2)

## 2023-04-30 LAB — URINALYSIS, W/ REFLEX TO CULTURE (INFECTION SUSPECTED)
Bacteria, UA: NONE SEEN
Bilirubin Urine: NEGATIVE
Glucose, UA: NEGATIVE mg/dL
Hgb urine dipstick: NEGATIVE
Ketones, ur: NEGATIVE mg/dL
Leukocytes,Ua: NEGATIVE
Nitrite: NEGATIVE
Protein, ur: NEGATIVE mg/dL
Specific Gravity, Urine: 1.015 (ref 1.005–1.030)
pH: 5 (ref 5.0–8.0)

## 2023-04-30 LAB — I-STAT CG4 LACTIC ACID, ED
Lactic Acid, Venous: 0.7 mmol/L (ref 0.5–1.9)
Lactic Acid, Venous: 2.5 mmol/L (ref 0.5–1.9)

## 2023-04-30 MED ORDER — SODIUM CHLORIDE 0.9 % IV BOLUS
1000.0000 mL | Freq: Once | INTRAVENOUS | Status: AC
  Administered 2023-04-30: 1000 mL via INTRAVENOUS

## 2023-04-30 NOTE — ED Notes (Signed)
 Discharge paperwork reviewed, verbalized understanding. Ivs pulled.

## 2023-05-05 ENCOUNTER — Ambulatory Visit (HOSPITAL_BASED_OUTPATIENT_CLINIC_OR_DEPARTMENT_OTHER): Payer: Medicare Other | Admitting: Pulmonary Disease

## 2023-05-05 ENCOUNTER — Encounter (HOSPITAL_BASED_OUTPATIENT_CLINIC_OR_DEPARTMENT_OTHER): Payer: Self-pay | Admitting: Pulmonary Disease

## 2023-05-05 VITALS — BP 114/60 | HR 86 | Resp 20 | Ht 71.0 in | Wt 173.3 lb

## 2023-05-05 DIAGNOSIS — J9601 Acute respiratory failure with hypoxia: Secondary | ICD-10-CM

## 2023-05-05 DIAGNOSIS — J441 Chronic obstructive pulmonary disease with (acute) exacerbation: Secondary | ICD-10-CM | POA: Diagnosis not present

## 2023-05-05 LAB — CULTURE, BLOOD (ROUTINE X 2)
Culture: NO GROWTH
Culture: NO GROWTH

## 2023-05-05 MED ORDER — METHYLPREDNISOLONE ACETATE 80 MG/ML IJ SUSP
80.0000 mg | Freq: Once | INTRAMUSCULAR | Status: AC
  Administered 2023-05-05: 80 mg via INTRAMUSCULAR

## 2023-05-05 MED ORDER — AMOXICILLIN-POT CLAVULANATE 875-125 MG PO TABS: 1.0000 | ORAL_TABLET | Freq: Two times a day (BID) | ORAL | 0 refills | Status: DC

## 2023-05-05 NOTE — Patient Instructions (Signed)
Depomedrol 80mg  IM   Augmentin 875 bid x 7 days  Home O2 2 L /min  VISIT SUMMARY:  You visited [place] today due to a recent fall and concerns about your breathing. You mentioned feeling like you were going to pass out before the fall and have had trouble moving around since. You also reported low oxygen levels at home, a bad cold with coughing, and sore gums. We discussed your symptoms and made a plan to address each issue.  YOUR PLAN:  -ACUTE HYPOXIC RESPIRATORY FAILURE: This condition means your body isn't getting enough oxygen, likely due to a worsening of your COPD and a possible bronchitis infection. We will give you a Depo-Medrol shot to reduce inflammation and prescribe an antibiotic to treat any potential bacterial infection. We will also provide home oxygen therapy when you are active.  -CHRONIC OBSTRUCTIVE PULMONARY DISEASE (COPD): COPD is a chronic lung condition that makes it hard to breathe. You are currently managing it with Duoneb. We will continue this medication and closely monitor your respiratory status.  -FALL WITH HYPOTENSION: You experienced a fall, possibly due to low blood pressure and low oxygen levels. We will monitor your blood pressure and check for orthostatic hypotension, which is a drop in blood pressure when you stand up, to prevent future falls.  -SORE GUMS: You have sore gums that feel raw, but there are no visible ulcers or lesions. We recommend continuing to gargle with warm salt water and monitoring for any changes or worsening symptoms.

## 2023-05-05 NOTE — Progress Notes (Signed)
Subjective:    [name], male    DOB: [birth year], 83 y.o.   MRN: [id number]  HPI  83 yo former smoker who presents for follow-up of COPD -no PFTs, on empiric duonebs  PMhx : CVA in 2001, hx TBI in 2003, hx post-op PE on xarelto, OSA not on CPAP  -Does not like to wear CPAP due to facial weakness.  Quit in 2008. Smoked 1ppd x 40 years   Chief Complaint  Patient presents with   Acute Visit    Oxygen has dropped not over 90 since Thursday of last week. Has dropped in a low as 70s. It was 74 while using his wifes oxygen at home. Unable to walk   Discussed the use of AI scribe software for clinical note transcription with the patient, who gave verbal consent to proceed.  History of Present Illness   The patient, with a history of COPD, presents after a recent fall and ER visit on 2/6. BP- 80/50, SaO2- 98% on O2 (wife reports upto 6L) He reports feeling like he was going to pass out prior to the fall, which occurred while standing up to go to the restroom. He denies any weakness from a previous stroke. Since the fall, he has been unable to get around due to a loss of balance.  He has been experiencing a sensation of low oxygen levels, with home readings as low as 74, and has been using supplemental oxygen at home from his wife. He also reports having a bad cold and coughing up clear sputum. He has been using his nebulizer as prescribed.  Additionally, he reports sore gums, which he describes as feeling raw. This symptom has developed in the last few days.   LABS BUN: 22 (04/29/2023) Creatinine: 1.4 (04/29/2023) Lactate: 2.5 (04/29/2023)  RADIOLOGY CT head: Negative (04/29/2023) CT cervical spine: Negative (04/29/2023) Chest x-ray: Interstitial prominence, nodular infiltrate (04/29/2023)       Significant tests/ events reviewed  CT Chest Lung Cancer Screen 04/03/17 - Bilateral lower lobe subcentimeter nodules CT CAP (Chest reviewed) 12/04/20 - Bilateral calcifed  pleural plaques. Apical predominant emphysema. Lower lobe bronchial thicekning. RLL lung nodules <50mm  Review of Systems neg for any significant sore throat, dysphagia, itching, sneezing, nasal congestion or excess/ purulent secretions, fever, chills, sweats, unintended wt loss, pleuritic or exertional cp, hempoptysis, orthopnea pnd or change in chronic leg swelling. Also denies presyncope, palpitations, heartburn, abdominal pain, nausea, vomiting, diarrhea or change in bowel or urinary habits, dysuria,hematuria, rash, arthralgias, visual complaints, headache, numbness weakness or ataxia.     Objective:   Physical Exam   Gen. Pleasant, elderly,well-nourished, in no distress ENT - no thrush, no pallor/icterus,no post nasal drip Neck: No JVD, no thyromegaly, no carotid bruits Lungs: no use of accessory muscles, no dullness to percussion, LT basal rales no rhonchi  Cardiovascular: Rhythm regular, heart sounds  normal, no murmurs or gallops, no peripheral edema Musculoskeletal: No deformities, no cyanosis or clubbing  Neuro -RT facial weakness,slurred speech        Assessment & Plan:     Assessment and Plan : He desaturates on minimal exertion we will start him on home oxygen therapy.  This is likely related to COPD exacerbation    Acute Hypoxic Respiratory Failure Presents with acute hypoxic respiratory failure, likely secondary to bronchitis and COPD exacerbation. Oxygen saturation drops to 74% at home. Recent fall possibly related to hypotension and hypoxia. Chest X-ray shows interstitial prominence and nuclear infiltrate, no leukocytosis.  BUN/creatinine at baseline, lactate improved from 2.5. Using Duoneb and nebulizer regularly. Discussed steroid and antibiotic therapy: steroids reduce inflammation and improve breathing, antibiotics treat potential bacterial infection. Side effects include increased blood sugar and gastrointestinal upset. Alternative: continue current medications  without additional therapy, but may not address acute exacerbation. - Administer Depo-Medrol shot - Prescribe antibiotic - Evaluate for home oxygen therapy with exertion  Chronic Obstructive Pulmonary Disease (COPD) COPD managed with Duoneb. Increased coughing with clear sputum. No wheezing on examination. Balance issues possibly exacerbated by respiratory condition. Discussed continuing current medication regimen and monitoring respiratory status closely. - depomedrol 80, then pred course - Continue Duoneb - Monitor respiratory status closely  Fall with Hypotension Experienced a fall on February 5th, resulting in ER visit. Initially hypotensive with BP 80/50. CT head and cervical spine negative. Fall may have been precipitated by syncopal episode due to hypotension and hypoxia. Discussed monitoring blood pressure and assessing for orthostatic hypotension to prevent future falls. - Monitor blood pressure   Sore Gums Reports sore gums, feeling raw. No visible ulcers or lesions. No medications identified as cause. Gargling with warm salt water for relief. Discussed continuing current management and monitoring for changes. - Continue warm salt water gargles - Monitor for any changes or worsening symptoms  Follow-up - Schedule follow-up appointment to assess respiratory status an

## 2023-05-11 ENCOUNTER — Ambulatory Visit (HOSPITAL_BASED_OUTPATIENT_CLINIC_OR_DEPARTMENT_OTHER): Payer: Medicare Other | Admitting: Pulmonary Disease

## 2023-05-26 ENCOUNTER — Other Ambulatory Visit (HOSPITAL_BASED_OUTPATIENT_CLINIC_OR_DEPARTMENT_OTHER): Payer: Self-pay

## 2023-05-26 DIAGNOSIS — J441 Chronic obstructive pulmonary disease with (acute) exacerbation: Secondary | ICD-10-CM

## 2023-05-26 DIAGNOSIS — J9601 Acute respiratory failure with hypoxia: Secondary | ICD-10-CM

## 2023-06-03 ENCOUNTER — Ambulatory Visit (HOSPITAL_BASED_OUTPATIENT_CLINIC_OR_DEPARTMENT_OTHER): Payer: Medicare Other | Admitting: Pulmonary Disease

## 2023-06-03 ENCOUNTER — Encounter (HOSPITAL_BASED_OUTPATIENT_CLINIC_OR_DEPARTMENT_OTHER): Payer: Self-pay | Admitting: Pulmonary Disease

## 2023-06-03 VITALS — BP 122/74 | HR 71 | Ht 71.0 in | Wt 176.0 lb

## 2023-06-03 DIAGNOSIS — R918 Other nonspecific abnormal finding of lung field: Secondary | ICD-10-CM

## 2023-06-03 DIAGNOSIS — J449 Chronic obstructive pulmonary disease, unspecified: Secondary | ICD-10-CM

## 2023-06-03 DIAGNOSIS — J439 Emphysema, unspecified: Secondary | ICD-10-CM | POA: Diagnosis not present

## 2023-06-03 DIAGNOSIS — J432 Centrilobular emphysema: Secondary | ICD-10-CM

## 2023-06-03 MED ORDER — IPRATROPIUM-ALBUTEROL 0.5-2.5 (3) MG/3ML IN SOLN: 3.0000 mL | Freq: Four times a day (QID) | RESPIRATORY_TRACT | 11 refills | Status: DC

## 2023-06-03 NOTE — Patient Instructions (Signed)
  Emphysema Chronic bronchitis --CONTINUE DUONEBS up to THREE times a day AS NEEDED for shortness of breath or wheezing. REFILLED --Discussed daily deep breathing exercises  COPD Action Plan Increase DUONEBS to EVERY FOUR HOURS for worsening shortness of breath, wheezing and cough. If you symptoms do not improve in 24-48 hours, please our office for evaluation and/or steroid taper.

## 2023-06-03 NOTE — Progress Notes (Signed)
 Subjective:   [name] Couzens GENDER: male DOB: Jun 10, 1940, MRN: [id number]   HPI  Chief Complaint  Patient presents with   Follow-up    Centrilobular emphysema    Reason for Visit: COPD follow-up  Mr. Brylon Brenning is a 83 year old male former smoker with hx CVA in 2001, hx TBI in 2003, hx post-op PE on xarelto, OSA not on CPAP who presents for follow-up  Synopsis: 2022 - Established care in Oct. Bronchitis symptoms with nocturnal awakening of wheezing and productive cough.  He was diagnosed with OSA >10 years ago. Does not like to wear CPAP due to facial weakness. Was started on nebulizer treatment for COPD 2023 - Previously on daily Duonebs as needed. Mainly shortness of breath. No exacerbations this year  04/29/21 Since our last visit he has been taking Duonebs once a day with a day. Some shortness of breath. Denies coughing and wheezing, which is improved. No longer having nocturnal symptoms. No exacerbations since our last visit.  10/28/21 Family present. Since our last visit he has not required Duonebs. Denies shortness of breath, cough or wheezing. Not active at baseline but when he is he uses his a walker. No ED/urgent care visits since our last visit.  04/30/22 Presents with wife. Since our last visit he reports overall well controlled with a few colds but did not need nebulizer or medications. Denies shortness of breath, cough or wheezing. Not active at baseline so will get winded with short distances.  06/03/23 Since our last visit he was seen in acute visit with Dr. Vassie Loll for exacerbation with O2 desaturations and treated with prednisone course and augmentin. Cough resolved after two weeks. Improved. Uses Duonebs once a day for shortness of breath with exertion with short distances. Otherwise sedentary and asymptomatic.  Social History: Quit in 2008. Smoked 1ppd x 40 years  Environmental exposures: Asbestosis during insulation for 5-6 years.  Past Medical History:   Diagnosis Date   Anxiety and depression    Back pain    Brain bleed (HCC)    2003   CVA (cerebral infarction)    2001   Hypercholesteremia    Hypertension    Peripheral neuropathy    Pulmonary embolism (HCC)    Sleep apnea    Stroke (HCC)    2001   TBI (traumatic brain injury) (HCC)      Family History  Problem Relation Age of Onset   Heart attack Father        Mother - health hx unknown     Social History   Occupational History   Occupation: Retired  Tobacco Use   Smoking status: Former    Current packs/day: 0.00    Average packs/day: 1.5 packs/day for 47.0 years (70.5 ttl pk-yrs)    Types: Cigarettes    Start date: 03/25/1959    Quit date: 03/24/2006    Years since quitting: 17.2   Smokeless tobacco: Never  Vaping Use   Vaping status: Never Used  Substance and Sexual Activity   Alcohol use: Yes    Alcohol/week: 0.0 standard drinks of alcohol    Comment: 1 beer per day   Drug use: No   Sexual activity: Not on file    Allergies  Allergen Reactions   Atenolol Anaphylaxis    Per [facility]   Gabapentin Other (See Comments)    falls   Lotensin [Benazepril Hcl] Swelling    Caused tongue to swell   Wellbutrin [Bupropion] Other (See Comments)  Mental status changes per records from [facility]     Outpatient Medications Prior to Visit  Medication Sig Dispense Refill   amLODipine (NORVASC) 5 MG tablet Take 5 mg by mouth daily.     atorvastatin (LIPITOR) 20 MG tablet Take 20 mg by mouth daily.      citalopram (CELEXA) 40 MG tablet Take 40 mg by mouth at bedtime.      Evolocumab (REPATHA) 140 MG/ML SOSY Inject 140 mg into the skin every 14 (fourteen) days. Tuesday     Ferrous Gluconate-C-Folic Acid (IRON-C PO) Take 1 tablet by mouth every other day.     fish oil-omega-3 fatty acids 1000 MG capsule Take 1 g by mouth daily.     losartan (COZAAR) 100 MG tablet Take 100 mg by mouth daily.     mirtazapine (REMERON) 15 MG tablet Take 15 mg by mouth daily.     Multiple Vitamin  (MULTIVITAMIN WITH MINERALS) TABS tablet Take 1 tablet by mouth daily.     pantoprazole (PROTONIX) 40 MG tablet Take 40 mg by mouth daily.      tamsulosin (FLOMAX) 0.4 MG CAPS capsule Take 1 capsule by mouth daily.     vitamin B-12 (CYANOCOBALAMIN) 1000 MCG tablet Take 1,000 mcg by mouth daily.     XARELTO 10 MG TABS tablet Take 10 mg by mouth daily.     ipratropium-albuterol (DUONEB) 0.5-2.5 (3) MG/3ML SOLN Take 3 mLs by nebulization every 6 (six) hours. 360 mL 11   amoxicillin-clavulanate (AUGMENTIN) 875-125 MG tablet Take 1 tablet by mouth 2 (two) times daily. (Patient not taking: Reported on 06/03/2023) 14 tablet 0   No facility-administered medications prior to visit.    Review of Systems  Constitutional:  Negative for chills, diaphoresis, fever, malaise/fatigue and weight loss.  HENT:  Negative for congestion.   Respiratory:  Positive for shortness of breath. Negative for cough, hemoptysis, sputum production and wheezing.   Cardiovascular:  Negative for chest pain, palpitations and leg swelling.     Objective:   Vitals:   06/03/23 1039  BP: 122/74  Pulse: 71  SpO2: 97%  Weight: 176 lb (79.8 kg)  Height: 5\' 11"  (1.803 m)   SpO2: 97 %  Physical Exam: General: Chronically ill-appearing, no acute distress HENT: [place], AT Eyes: EOMI, no scleral icterus Respiratory: Clear to auscultation bilaterally.  No crackles, wheezing or rales Cardiovascular: RRR, -M/R/G, no JVD Extremities:-Edema,-tenderness Neuro: AAO x4, CNII-XII grossly intact Psych: Normal mood, normal affect   Data Reviewed:  Imaging: CT Chest Lung Cancer Screen 04/03/17 - Bilateral lower lobe subcentimeter nodules CT CAP (Chest reviewed) 12/04/20 - Bilateral calcifed pleural plaques. Apical predominant emphysema. Lower lobe bronchial thicekning. RLL lung nodules <35mm CXR 04/30/23 No active cardiopulmonary disease  PFT: None on file  Labs: CBC    Component Value Date/Time   WBC 9.9 04/29/2023 2351   RBC  3.99 (L) 04/29/2023 2351   HGB 12.8 (L) 04/29/2023 2351   HGB 13.1 01/27/2023 1328   HCT 37.9 (L) 04/29/2023 2351   PLT 160 04/29/2023 2351   PLT 217 01/27/2023 1328   MCV 95.0 04/29/2023 2351   MCH 32.1 04/29/2023 2351   MCHC 33.8 04/29/2023 2351   RDW 12.9 04/29/2023 2351   LYMPHSABS 0.4 (L) 04/29/2023 2351   MONOABS 0.5 04/29/2023 2351   EOSABS 0.0 04/29/2023 2351   BASOSABS 0.0 04/29/2023 2351   Absolute eos 09/09/19 - 200    Assessment & Plan:   Discussion: 83 year old former smoker with hx  CVA in 2001, hx TBI in 2003, hx post-op PE on Xarelto, OSA not on CPAP who presents for follow-up. Sedentary at baseline. Due to orofacial weakness, will defer PFTs and opt for nebulizer treatment for management of his symptoms. Recently had COPD exacerbation 04/2023 that has resolved. Ambulatory O2 with no desaturations. Discussed clinical course and management of COPD including bronchodilator regimen, preventive care including vaccinations and action plan for exacerbation.  Emphysema Chronic bronchitis --CONTINUE DUONEBS up to THREE times a day AS NEEDED for shortness of breath or wheezing. REFILLED --Discussed daily deep breathing exercises  COPD Action Plan Increase DUONEBS to EVERY FOUR HOURS for worsening shortness of breath, wheezing and cough. If you symptoms do not improve in 24-48 hours, please our office for evaluation and/or steroid taper.  Subcentimeter lung nodules, benign --Reviewed CT 11/2020 and compared to 03/2017. Unchanged/stable nodule size >2 years. --No indication for further imaging  Health Maintenance Immunization History  Administered Date(s) Administered   Influenza, High Dose Seasonal PF 12/20/2021   Influenza, Quadrivalent, Recombinant, Inj, Pf 12/30/2016, 11/27/2017, 11/26/2018, 01/03/2020, 12/07/2020   Influenza-Unspecified 12/22/2017   Pneumococcal Conjugate-13 02/22/2014   Pneumococcal Polysaccharide-23 03/09/2001, 09/07/2007   Tdap 10/26/2014, 09/10/2020    Zoster Recombinant(Shingrix) 10/04/2018, 12/22/2018   CT Lung Screen - not qualified. >80 years age  No orders of the defined types were placed in this encounter.  Meds ordered this encounter  Medications   ipratropium-albuterol (DUONEB) 0.5-2.5 (3) MG/3ML SOLN    Sig: Take 3 mLs by nebulization every 6 (six) hours.    Dispense:  360 mL    Refill:  11    Dx: J44.9   Return in about 1 year (around 06/02/2024), or if symptoms worsen or fail to improve.  I have spent a total time of 35-minutes on the day of the appointment including chart review, data review, collecting history, coordinating care and discussing medical diagnosis and plan with the patient/family. Past medical history, allergies, medications were reviewed. Pertinent imaging, labs and tests included in this note have been reviewed and interpreted independently by me.  Michala Deblanc Mechele Collin, MD [facility] 06/03/2023 11:01 AM  Office Number [phone]

## 2023-06-14 ENCOUNTER — Emergency Department (HOSPITAL_COMMUNITY)

## 2023-06-14 ENCOUNTER — Other Ambulatory Visit: Payer: Self-pay

## 2023-06-14 ENCOUNTER — Inpatient Hospital Stay (HOSPITAL_COMMUNITY)
Admission: EM | Admit: 2023-06-14 | Discharge: 2023-06-26 | DRG: 871 | Disposition: A | Discharge status: Skilled Nursing Facility | Attending: Internal Medicine | Admitting: Internal Medicine

## 2023-06-14 DIAGNOSIS — Z515 Encounter for palliative care: Secondary | ICD-10-CM | POA: Diagnosis not present

## 2023-06-14 DIAGNOSIS — R0689 Other abnormalities of breathing: Secondary | ICD-10-CM | POA: Diagnosis not present

## 2023-06-14 DIAGNOSIS — E876 Hypokalemia: Secondary | ICD-10-CM | POA: Diagnosis not present

## 2023-06-14 DIAGNOSIS — J440 Chronic obstructive pulmonary disease with acute lower respiratory infection: Secondary | ICD-10-CM | POA: Diagnosis present

## 2023-06-14 DIAGNOSIS — K5730 Diverticulosis of large intestine without perforation or abscess without bleeding: Secondary | ICD-10-CM | POA: Diagnosis not present

## 2023-06-14 DIAGNOSIS — F32A Depression, unspecified: Secondary | ICD-10-CM | POA: Diagnosis present

## 2023-06-14 DIAGNOSIS — I7121 Aneurysm of the ascending aorta, without rupture: Secondary | ICD-10-CM | POA: Diagnosis not present

## 2023-06-14 DIAGNOSIS — Z87820 Personal history of traumatic brain injury: Secondary | ICD-10-CM

## 2023-06-14 DIAGNOSIS — I159 Secondary hypertension, unspecified: Secondary | ICD-10-CM | POA: Diagnosis not present

## 2023-06-14 DIAGNOSIS — R140 Abdominal distension (gaseous): Secondary | ICD-10-CM | POA: Diagnosis present

## 2023-06-14 DIAGNOSIS — G629 Polyneuropathy, unspecified: Secondary | ICD-10-CM | POA: Diagnosis present

## 2023-06-14 DIAGNOSIS — R918 Other nonspecific abnormal finding of lung field: Secondary | ICD-10-CM | POA: Diagnosis present

## 2023-06-14 DIAGNOSIS — I712 Thoracic aortic aneurysm, without rupture, unspecified: Secondary | ICD-10-CM

## 2023-06-14 DIAGNOSIS — Z87891 Personal history of nicotine dependence: Secondary | ICD-10-CM

## 2023-06-14 DIAGNOSIS — Z66 Do not resuscitate: Secondary | ICD-10-CM | POA: Diagnosis present

## 2023-06-14 DIAGNOSIS — J189 Pneumonia, unspecified organism: Principal | ICD-10-CM

## 2023-06-14 DIAGNOSIS — N400 Enlarged prostate without lower urinary tract symptoms: Secondary | ICD-10-CM | POA: Diagnosis present

## 2023-06-14 DIAGNOSIS — R1312 Dysphagia, oropharyngeal phase: Secondary | ICD-10-CM | POA: Diagnosis present

## 2023-06-14 DIAGNOSIS — E872 Acidosis, unspecified: Secondary | ICD-10-CM | POA: Diagnosis present

## 2023-06-14 DIAGNOSIS — Z7901 Long term (current) use of anticoagulants: Secondary | ICD-10-CM

## 2023-06-14 DIAGNOSIS — J929 Pleural plaque without asbestos: Secondary | ICD-10-CM | POA: Diagnosis not present

## 2023-06-14 DIAGNOSIS — I700 Atherosclerosis of aorta: Secondary | ICD-10-CM | POA: Diagnosis not present

## 2023-06-14 DIAGNOSIS — Z86718 Personal history of other venous thrombosis and embolism: Secondary | ICD-10-CM

## 2023-06-14 DIAGNOSIS — I129 Hypertensive chronic kidney disease with stage 1 through stage 4 chronic kidney disease, or unspecified chronic kidney disease: Secondary | ICD-10-CM | POA: Diagnosis present

## 2023-06-14 DIAGNOSIS — R9431 Abnormal electrocardiogram [ECG] [EKG]: Secondary | ICD-10-CM | POA: Diagnosis present

## 2023-06-14 DIAGNOSIS — Z888 Allergy status to other drugs, medicaments and biological substances status: Secondary | ICD-10-CM

## 2023-06-14 DIAGNOSIS — Z1152 Encounter for screening for COVID-19: Secondary | ICD-10-CM

## 2023-06-14 DIAGNOSIS — R4781 Slurred speech: Secondary | ICD-10-CM | POA: Diagnosis not present

## 2023-06-14 DIAGNOSIS — N1832 Chronic kidney disease, stage 3b: Secondary | ICD-10-CM | POA: Diagnosis present

## 2023-06-14 DIAGNOSIS — M4856XA Collapsed vertebra, not elsewhere classified, lumbar region, initial encounter for fracture: Secondary | ICD-10-CM | POA: Diagnosis present

## 2023-06-14 DIAGNOSIS — G4733 Obstructive sleep apnea (adult) (pediatric): Secondary | ICD-10-CM | POA: Diagnosis not present

## 2023-06-14 DIAGNOSIS — R509 Fever, unspecified: Secondary | ICD-10-CM | POA: Diagnosis not present

## 2023-06-14 DIAGNOSIS — A419 Sepsis, unspecified organism: Principal | ICD-10-CM | POA: Diagnosis present

## 2023-06-14 DIAGNOSIS — Z7189 Other specified counseling: Secondary | ICD-10-CM | POA: Diagnosis not present

## 2023-06-14 DIAGNOSIS — Z86711 Personal history of pulmonary embolism: Secondary | ICD-10-CM

## 2023-06-14 DIAGNOSIS — R6520 Severe sepsis without septic shock: Secondary | ICD-10-CM | POA: Diagnosis not present

## 2023-06-14 DIAGNOSIS — Z79899 Other long term (current) drug therapy: Secondary | ICD-10-CM

## 2023-06-14 DIAGNOSIS — K8020 Calculus of gallbladder without cholecystitis without obstruction: Secondary | ICD-10-CM | POA: Diagnosis not present

## 2023-06-14 DIAGNOSIS — R062 Wheezing: Secondary | ICD-10-CM | POA: Diagnosis not present

## 2023-06-14 DIAGNOSIS — R1110 Vomiting, unspecified: Secondary | ICD-10-CM | POA: Diagnosis present

## 2023-06-14 DIAGNOSIS — J690 Pneumonitis due to inhalation of food and vomit: Secondary | ICD-10-CM | POA: Diagnosis not present

## 2023-06-14 DIAGNOSIS — E7800 Pure hypercholesterolemia, unspecified: Secondary | ICD-10-CM | POA: Diagnosis not present

## 2023-06-14 DIAGNOSIS — J439 Emphysema, unspecified: Secondary | ICD-10-CM | POA: Diagnosis present

## 2023-06-14 DIAGNOSIS — R0602 Shortness of breath: Secondary | ICD-10-CM | POA: Diagnosis not present

## 2023-06-14 DIAGNOSIS — K219 Gastro-esophageal reflux disease without esophagitis: Secondary | ICD-10-CM | POA: Diagnosis not present

## 2023-06-14 DIAGNOSIS — R000 Tachycardia, unspecified: Secondary | ICD-10-CM | POA: Diagnosis not present

## 2023-06-14 DIAGNOSIS — J9601 Acute respiratory failure with hypoxia: Secondary | ICD-10-CM

## 2023-06-14 DIAGNOSIS — Z96641 Presence of right artificial hip joint: Secondary | ICD-10-CM | POA: Diagnosis present

## 2023-06-14 DIAGNOSIS — R54 Age-related physical debility: Secondary | ICD-10-CM | POA: Diagnosis present

## 2023-06-14 DIAGNOSIS — Z8673 Personal history of transient ischemic attack (TIA), and cerebral infarction without residual deficits: Secondary | ICD-10-CM

## 2023-06-14 DIAGNOSIS — D472 Monoclonal gammopathy: Secondary | ICD-10-CM | POA: Diagnosis present

## 2023-06-14 DIAGNOSIS — Z8249 Family history of ischemic heart disease and other diseases of the circulatory system: Secondary | ICD-10-CM

## 2023-06-14 DIAGNOSIS — F419 Anxiety disorder, unspecified: Secondary | ICD-10-CM | POA: Diagnosis present

## 2023-06-14 LAB — CBC WITH DIFFERENTIAL/PLATELET
Abs Immature Granulocytes: 0.09 10*3/uL — ABNORMAL HIGH (ref 0.00–0.07)
Basophils Absolute: 0 10*3/uL (ref 0.0–0.1)
Basophils Relative: 0 %
Eosinophils Absolute: 0 10*3/uL (ref 0.0–0.5)
Eosinophils Relative: 0 %
HCT: 39 % (ref 39.0–52.0)
Hemoglobin: 12.9 g/dL — ABNORMAL LOW (ref 13.0–17.0)
Immature Granulocytes: 1 %
Lymphocytes Relative: 14 %
Lymphs Abs: 1.4 10*3/uL (ref 0.7–4.0)
MCH: 32.6 pg (ref 26.0–34.0)
MCHC: 33.1 g/dL (ref 30.0–36.0)
MCV: 98.5 fL (ref 80.0–100.0)
Monocytes Absolute: 0.8 10*3/uL (ref 0.1–1.0)
Monocytes Relative: 7 %
Neutro Abs: 7.9 10*3/uL — ABNORMAL HIGH (ref 1.7–7.7)
Neutrophils Relative %: 78 %
Platelets: 163 10*3/uL (ref 150–400)
RBC: 3.96 MIL/uL — ABNORMAL LOW (ref 4.22–5.81)
RDW: 14.2 % (ref 11.5–15.5)
WBC: 10.1 10*3/uL (ref 4.0–10.5)
nRBC: 0 % (ref 0.0–0.2)

## 2023-06-14 LAB — I-STAT CG4 LACTIC ACID, ED
Lactic Acid, Venous: 2.4 mmol/L (ref 0.5–1.9)
Lactic Acid, Venous: 3.3 mmol/L (ref 0.5–1.9)

## 2023-06-14 LAB — COMPREHENSIVE METABOLIC PANEL
ALT: 18 U/L (ref 0–44)
AST: 23 U/L (ref 15–41)
Albumin: 3.7 g/dL (ref 3.5–5.0)
Alkaline Phosphatase: 54 U/L (ref 38–126)
Anion gap: 13 (ref 5–15)
BUN: 24 mg/dL — ABNORMAL HIGH (ref 8–23)
CO2: 20 mmol/L — ABNORMAL LOW (ref 22–32)
Calcium: 9.2 mg/dL (ref 8.9–10.3)
Chloride: 106 mmol/L (ref 98–111)
Creatinine, Ser: 1.56 mg/dL — ABNORMAL HIGH (ref 0.61–1.24)
GFR, Estimated: 44 mL/min — ABNORMAL LOW (ref >60)
Glucose, Bld: 147 mg/dL — ABNORMAL HIGH (ref 70–99)
Potassium: 3.6 mmol/L (ref 3.5–5.1)
Sodium: 139 mmol/L (ref 135–145)
Total Bilirubin: 1.1 mg/dL (ref 0.0–1.2)
Total Protein: 7.1 g/dL (ref 6.5–8.1)

## 2023-06-14 LAB — RESP PANEL BY RT-PCR (RSV, FLU A&B, COVID)  RVPGX2
Influenza A by PCR: NEGATIVE
Influenza B by PCR: NEGATIVE
Resp Syncytial Virus by PCR: NEGATIVE
SARS Coronavirus 2 by RT PCR: NEGATIVE

## 2023-06-14 LAB — APTT: aPTT: 27 s (ref 24–36)

## 2023-06-14 LAB — PROTIME-INR
INR: 1.2 (ref 0.8–1.2)
Prothrombin Time: 15 s (ref 11.4–15.2)

## 2023-06-14 MED ORDER — SODIUM CHLORIDE 0.9 % IV SOLN
2.0000 g | Freq: Once | INTRAVENOUS | Status: AC
  Administered 2023-06-14: 2 g via INTRAVENOUS
  Filled 2023-06-14: qty 20

## 2023-06-14 MED ORDER — SODIUM CHLORIDE 0.9 % IV SOLN
500.0000 mg | Freq: Once | INTRAVENOUS | Status: AC
  Administered 2023-06-14: 500 mg via INTRAVENOUS
  Filled 2023-06-14: qty 5

## 2023-06-14 MED ORDER — LACTATED RINGERS IV BOLUS
1000.0000 mL | Freq: Once | INTRAVENOUS | Status: AC
  Administered 2023-06-14: 1000 mL via INTRAVENOUS

## 2023-06-14 MED ORDER — SODIUM CHLORIDE 0.9 % IV BOLUS (SEPSIS)
1000.0000 mL | Freq: Once | INTRAVENOUS | Status: AC
  Administered 2023-06-14: 1000 mL via INTRAVENOUS

## 2023-06-14 MED ORDER — IOHEXOL 350 MG/ML SOLN
75.0000 mL | Freq: Once | INTRAVENOUS | Status: AC | PRN
  Administered 2023-06-14: 75 mL via INTRAVENOUS

## 2023-06-14 NOTE — ED Provider Notes (Signed)
 [place] EMERGENCY DEPARTMENT AT [facility] Provider Note  CSN: [id number] Arrival date & time: 06/14/23 2003  Chief Complaint(s) Shortness of Breath  HPI Steven Guerrero is a 83 y.o. male with PMH HTN, previous CVA, PE on Xarelto, COPD who presents emergency room for evaluation of shortness of breath.  Family states that symptoms began this morning with shortness of breath, Cough and wheezing.  EMS found the patient to be febrile to 103 in the field and patient took Tylenol prior to arrival.  Received 15 of albuterol, 1 mg Atrovent, 125 Solu-Medrol prior to arrival.  Here in the emergency room, patient's respiratory status appears to be improving and currently he is not complaining of any chest pain, shortness of breath, abdominal pain, nausea, vomiting or other systemic symptoms.   Past Medical History Past Medical History:  Diagnosis Date  . Anxiety and depression   . Back pain   . Brain bleed (HCC)    2003  . CVA (cerebral infarction)    2001  . Hypercholesteremia   . Hypertension   . Peripheral neuropathy   . Pulmonary embolism (HCC)   . Sleep apnea   . Stroke (HCC)    2001  . TBI (traumatic brain injury) [facility])    Patient Active Problem List   Diagnosis Date Noted  . MGUS (monoclonal gammopathy of unknown significance) 10/28/2022  . Chronic left shoulder pain 06/04/2022  . Chronic bronchitis (HCC) 12/24/2020  . COPD with chronic bronchitis and emphysema (HCC) 12/24/2020  . Lung nodules 12/24/2020  . TIA (transient ischemic attack) 04/02/2018  . Essential hypertension 04/02/2018  . History of stroke 04/02/2018  . History of pulmonary embolus (PE) 04/02/2018  . H/O total hip arthroplasty 10/09/2016  . Laceration of right forearm 10/09/2016  . Long term current use of anticoagulant 02/08/2016  . Abnormality of gait 08/28/2015  . Head trauma 08/28/2015  . Stroke (HCC) 08/28/2015   Home Medication(s) Prior to Admission medications   Medication Sig Start  Date End Date Taking? Authorizing Provider  amLODipine (NORVASC) 5 MG tablet Take 5 mg by mouth daily. 07/21/22   [provider]  atorvastatin (LIPITOR) 20 MG tablet Take 20 mg by mouth daily.     [provider]  citalopram (CELEXA) 40 MG tablet Take 40 mg by mouth at bedtime.     [provider]  Evolocumab (REPATHA) 140 MG/ML SOSY Inject 140 mg into the skin every 14 (fourteen) days. Tuesday    [provider]  Ferrous Gluconate-C-Folic Acid (IRON-C PO) Take 1 tablet by mouth every other day.    [provider]  fish oil-omega-3 fatty acids 1000 MG capsule Take 1 g by mouth daily.    [provider]  ipratropium-albuterol (DUONEB) 0.5-2.5 (3) MG/3ML SOLN Take 3 mLs by nebulization every 6 (six) hours. 06/03/23   Luciano Cutter, MD  losartan (COZAAR) 100 MG tablet Take 100 mg by mouth daily. 07/16/19   [provider]  mirtazapine (REMERON) 15 MG tablet Take 15 mg by mouth daily. 11/30/20   [provider]  Multiple Vitamin (MULTIVITAMIN WITH MINERALS) TABS tablet Take 1 tablet by mouth daily.    [provider]  pantoprazole (PROTONIX) 40 MG tablet Take 40 mg by mouth daily.  02/15/16   [provider]  tamsulosin (FLOMAX) 0.4 MG CAPS capsule Take 1 capsule by mouth daily. 08/01/22   [provider]  vitamin B-12 (CYANOCOBALAMIN) 1000 MCG tablet Take 1,000 mcg by mouth daily.  [provider]  XARELTO 10 MG TABS tablet Take 10 mg by mouth daily.    [provider]                                                                                                                                    Past Surgical History Past Surgical History:  Procedure Laterality Date  . HERNIA REPAIR    . JOINT REPLACEMENT Right    hip replacement  . LEG SURGERY     Broken femur  . TOTAL HIP ARTHROPLASTY     Right   Family History Family History  Problem Relation Age of Onset  . Heart  attack Father        Mother - health hx unknown    Social History Social History   Tobacco Use  . Smoking status: Former    Current packs/day: 0.00    Average packs/day: 1.5 packs/day for 47.0 years (70.5 ttl pk-yrs)    Types: Cigarettes    Start date: 03/25/1959    Quit date: 03/24/2006    Years since quitting: 17.2  . Smokeless tobacco: Never  Vaping Use  . Vaping status: Never Used  Substance Use Topics  . Alcohol use: Yes    Alcohol/week: 0.0 standard drinks of alcohol    Comment: 1 beer per day  . Drug use: No   Allergies Atenolol, Gabapentin, Lotensin [benazepril hcl], and Wellbutrin [bupropion]  Review of Systems Review of Systems  Constitutional:  Positive for fever.  Respiratory:  Positive for cough, shortness of breath and wheezing.     Physical Exam Vital Signs  I have reviewed the triage vital signs BP (!) 117/51   Pulse (!) 115   Temp 98.9 F (37.2 C) (Oral)   Resp (!) 28   SpO2 94%   Physical Exam Constitutional:      General: He is not in acute distress.    Appearance: Normal appearance.  HENT:     Head: Normocephalic and atraumatic.     Nose: No congestion or rhinorrhea.  Eyes:     General:        Right eye: No discharge.        Left eye: No discharge.     Extraocular Movements: Extraocular movements intact.     Pupils: Pupils are equal, round, and reactive to light.  Cardiovascular:     Rate and Rhythm: Regular rhythm. Tachycardia present.     Heart sounds: No murmur heard. Pulmonary:     Effort: No respiratory distress.     Breath sounds: No wheezing or rales.  Abdominal:     General: There is no distension.     Tenderness: There is no abdominal tenderness.  Musculoskeletal:        General: Normal range of motion.     Cervical back: Normal range of motion.  Skin:    General: Skin is warm and dry.  Neurological:  General: No focal deficit present.     Mental Status: He is alert.    ED Results and Treatments Labs (all labs  ordered are listed, but only abnormal results are displayed) Labs Reviewed  COMPREHENSIVE METABOLIC PANEL - Abnormal; Notable for the following components:      Result Value   CO2 20 (*)    Glucose, Bld 147 (*)    BUN 24 (*)    Creatinine, Ser 1.56 (*)    GFR, Estimated 44 (*)    All other components within normal limits  CBC WITH DIFFERENTIAL/PLATELET - Abnormal; Notable for the following components:   RBC 3.96 (*)    Hemoglobin 12.9 (*)    Neutro Abs 7.9 (*)    Abs Immature Granulocytes 0.09 (*)    All other components within normal limits  I-STAT CG4 LACTIC ACID, ED - Abnormal; Notable for the following components:   Lactic Acid, Venous 2.4 (*)    All other components within normal limits  I-STAT CG4 LACTIC ACID, ED - Abnormal; Notable for the following components:   Lactic Acid, Venous 3.3 (*)    All other components within normal limits  RESP PANEL BY RT-PCR (RSV, FLU A&B, COVID)  RVPGX2  CULTURE, BLOOD (ROUTINE X 2)  CULTURE, BLOOD (ROUTINE X 2)  PROTIME-INR  APTT  URINALYSIS, W/ REFLEX TO CULTURE (INFECTION SUSPECTED)                                                                                                                          Radiology DG Chest Port 1 View Result Date: 06/14/2023 CLINICAL DATA:  Possible sepsis EXAM: PORTABLE CHEST 1 VIEW COMPARISON:  04/30/2023, 08/03/2022, 09/09/2019 FINDINGS: Bilateral calcified pleural plaques. Emphysema. Chronic appearing mild reticular opacity at the bases. No acute confluent airspace disease or effusion. Stable cardiomediastinal silhouette with aortic atherosclerosis. No pneumothorax IMPRESSION: No active disease. Emphysema and chronic appearing reticular opacity at the bases. Calcified pleural plaques. Electronically Signed   By: Jasmine Pang M.D.   On: 06/14/2023 20:22    Pertinent labs & imaging results that were available during my care of the patient were reviewed by me and considered in my medical decision making  (see MDM for details).  Medications Ordered in ED Medications  lactated ringers bolus 1,000 mL (1,000 mLs Intravenous New Bag/Given 06/14/23 2107)  Procedures Procedures  (including critical care time)  Medical Decision Making / ED Course   This patient presents to the ED for concern of cough, fever, this involves an extensive number of treatment options, and is a complaint that carries with it a high risk of complications and morbidity.  The differential diagnosis includes and dyspnea differential  MDM: Patient seen emergency room for evaluation of cough and shortness of breath.  Physical exam with a persistent tachycardia but is otherwise unremarkable.  No appreciable wheezing heard on lung exam.  Laboratory evaluation with a hemoglobin of 12.9, BUN 24, creatinine 1.56, lactic acid 2.4.  Fluid resuscitation begun and blood cultures obtained.  Initial tachycardia appears to be improving with fluid resuscitation and metabolization of previous albuterol treatments.  Chest x-ray with no acute findings.  Pending completion of imaging studies at time of signout.  Please see provider signout for continuation of workup.   Additional history obtained: -Additional history obtained from multiple family members -External records from outside source obtained and reviewed including: Chart review including previous notes, labs, imaging, consultation notes   Lab Tests: -I ordered, reviewed, and interpreted labs.   The pertinent results include:   Labs Reviewed  COMPREHENSIVE METABOLIC PANEL - Abnormal; Notable for the following components:      Result Value   CO2 20 (*)    Glucose, Bld 147 (*)    BUN 24 (*)    Creatinine, Ser 1.56 (*)    GFR, Estimated 44 (*)    All other components within normal limits  CBC WITH DIFFERENTIAL/PLATELET - Abnormal; Notable for the  following components:   RBC 3.96 (*)    Hemoglobin 12.9 (*)    Neutro Abs 7.9 (*)    Abs Immature Granulocytes 0.09 (*)    All other components within normal limits  I-STAT CG4 LACTIC ACID, ED - Abnormal; Notable for the following components:   Lactic Acid, Venous 2.4 (*)    All other components within normal limits  I-STAT CG4 LACTIC ACID, ED - Abnormal; Notable for the following components:   Lactic Acid, Venous 3.3 (*)    All other components within normal limits  RESP PANEL BY RT-PCR (RSV, FLU A&B, COVID)  RVPGX2  CULTURE, BLOOD (ROUTINE X 2)  CULTURE, BLOOD (ROUTINE X 2)  PROTIME-INR  APTT  URINALYSIS, W/ REFLEX TO CULTURE (INFECTION SUSPECTED)      EKG ***  EKG Interpretation Date/Time:    Ventricular Rate:    PR Interval:    QRS Duration:    QT Interval:    QTC Calculation:   R Axis:      Text Interpretation:           Imaging Studies ordered: I ordered imaging studies including *** I independently visualized and interpreted imaging. I agree with the radiologist interpretation   Medicines ordered and prescription drug management: Meds ordered this encounter  Medications  . lactated ringers bolus 1,000 mL    -I have reviewed the patients home medicines and have made adjustments as needed  Critical interventions ***  Consultations Obtained: I requested consultation with the ***,  and discussed lab and imaging findings as well as pertinent plan - they recommend: ***   Cardiac Monitoring: The patient was maintained on a cardiac monitor.  I personally viewed and interpreted the cardiac monitored which showed an underlying rhythm of: ***  Social Determinants of Health:  Factors impacting patients care include: ***   Reevaluation: After the interventions noted above, I reevaluated the  patient and found that they have :{resolved/improved/worsened:23923::"improved"}  Co morbidities that complicate the patient evaluation . Past Medical History:   Diagnosis Date  . Anxiety and depression   . Back pain   . Brain bleed (HCC)    2003  . CVA (cerebral infarction)    2001  . Hypercholesteremia   . Hypertension   . Peripheral neuropathy   . Pulmonary embolism (HCC)   . Sleep apnea   . Stroke (HCC)    2001  . TBI (traumatic brain injury) (HCC)       Dispostion: I considered admission for this patient, ***     Final Clinical Impression(s) / ED Diagnoses Final diagnoses:  None     @PCDICTATION @

## 2023-06-14 NOTE — ED Notes (Signed)
 EDP aware on patient tachypnea/tachycardia .

## 2023-06-14 NOTE — ED Triage Notes (Signed)
 Patient arrived with EMS from home reports SOB with chest congestion , wheezing and productive cough onset this morning , febrile , took Tylenol 500 mg po prior to arrival , EMS gave Albuterol 15 mg /2 Duoneb treatment prior to arrival with relief . Solumedrol 125 mg IV  given by EMS .

## 2023-06-14 NOTE — ED Provider Notes (Signed)
 Plan to treat with IV fluids, antibiotics, reassess lactate/troponin    Zadie Rhine, MD 06/14/23 2326

## 2023-06-15 ENCOUNTER — Inpatient Hospital Stay (HOSPITAL_COMMUNITY)

## 2023-06-15 ENCOUNTER — Encounter (HOSPITAL_COMMUNITY): Payer: Self-pay | Admitting: Internal Medicine

## 2023-06-15 DIAGNOSIS — R0902 Hypoxemia: Secondary | ICD-10-CM | POA: Diagnosis not present

## 2023-06-15 DIAGNOSIS — R6520 Severe sepsis without septic shock: Secondary | ICD-10-CM

## 2023-06-15 DIAGNOSIS — N400 Enlarged prostate without lower urinary tract symptoms: Secondary | ICD-10-CM | POA: Diagnosis present

## 2023-06-15 DIAGNOSIS — A419 Sepsis, unspecified organism: Secondary | ICD-10-CM

## 2023-06-15 DIAGNOSIS — R296 Repeated falls: Secondary | ICD-10-CM | POA: Diagnosis not present

## 2023-06-15 DIAGNOSIS — K219 Gastro-esophageal reflux disease without esophagitis: Secondary | ICD-10-CM | POA: Diagnosis not present

## 2023-06-15 DIAGNOSIS — Z1152 Encounter for screening for COVID-19: Secondary | ICD-10-CM | POA: Diagnosis not present

## 2023-06-15 DIAGNOSIS — I712 Thoracic aortic aneurysm, without rupture, unspecified: Secondary | ICD-10-CM | POA: Diagnosis not present

## 2023-06-15 DIAGNOSIS — R1312 Dysphagia, oropharyngeal phase: Secondary | ICD-10-CM | POA: Diagnosis not present

## 2023-06-15 DIAGNOSIS — R499 Unspecified voice and resonance disorder: Secondary | ICD-10-CM | POA: Diagnosis not present

## 2023-06-15 DIAGNOSIS — R0602 Shortness of breath: Secondary | ICD-10-CM | POA: Diagnosis not present

## 2023-06-15 DIAGNOSIS — R2681 Unsteadiness on feet: Secondary | ICD-10-CM | POA: Diagnosis not present

## 2023-06-15 DIAGNOSIS — I6930 Unspecified sequelae of cerebral infarction: Secondary | ICD-10-CM | POA: Diagnosis not present

## 2023-06-15 DIAGNOSIS — N1832 Chronic kidney disease, stage 3b: Secondary | ICD-10-CM | POA: Diagnosis not present

## 2023-06-15 DIAGNOSIS — J439 Emphysema, unspecified: Secondary | ICD-10-CM | POA: Diagnosis not present

## 2023-06-15 DIAGNOSIS — G9009 Other idiopathic peripheral autonomic neuropathy: Secondary | ICD-10-CM | POA: Diagnosis not present

## 2023-06-15 DIAGNOSIS — E785 Hyperlipidemia, unspecified: Secondary | ICD-10-CM | POA: Diagnosis not present

## 2023-06-15 DIAGNOSIS — Z8673 Personal history of transient ischemic attack (TIA), and cerebral infarction without residual deficits: Secondary | ICD-10-CM | POA: Diagnosis not present

## 2023-06-15 DIAGNOSIS — J929 Pleural plaque without asbestos: Secondary | ICD-10-CM | POA: Diagnosis not present

## 2023-06-15 DIAGNOSIS — J4489 Other specified chronic obstructive pulmonary disease: Secondary | ICD-10-CM | POA: Diagnosis not present

## 2023-06-15 DIAGNOSIS — M6281 Muscle weakness (generalized): Secondary | ICD-10-CM | POA: Diagnosis not present

## 2023-06-15 DIAGNOSIS — G4733 Obstructive sleep apnea (adult) (pediatric): Secondary | ICD-10-CM | POA: Diagnosis not present

## 2023-06-15 DIAGNOSIS — R54 Age-related physical debility: Secondary | ICD-10-CM | POA: Diagnosis present

## 2023-06-15 DIAGNOSIS — Z66 Do not resuscitate: Secondary | ICD-10-CM | POA: Diagnosis not present

## 2023-06-15 DIAGNOSIS — Z7901 Long term (current) use of anticoagulants: Secondary | ICD-10-CM | POA: Diagnosis not present

## 2023-06-15 DIAGNOSIS — J189 Pneumonia, unspecified organism: Secondary | ICD-10-CM | POA: Diagnosis present

## 2023-06-15 DIAGNOSIS — I517 Cardiomegaly: Secondary | ICD-10-CM | POA: Diagnosis not present

## 2023-06-15 DIAGNOSIS — J42 Unspecified chronic bronchitis: Secondary | ICD-10-CM | POA: Diagnosis not present

## 2023-06-15 DIAGNOSIS — I77810 Thoracic aortic ectasia: Secondary | ICD-10-CM | POA: Diagnosis not present

## 2023-06-15 DIAGNOSIS — I10 Essential (primary) hypertension: Secondary | ICD-10-CM | POA: Diagnosis not present

## 2023-06-15 DIAGNOSIS — E876 Hypokalemia: Secondary | ICD-10-CM | POA: Diagnosis not present

## 2023-06-15 DIAGNOSIS — G629 Polyneuropathy, unspecified: Secondary | ICD-10-CM | POA: Diagnosis present

## 2023-06-15 DIAGNOSIS — J690 Pneumonitis due to inhalation of food and vomit: Secondary | ICD-10-CM | POA: Diagnosis not present

## 2023-06-15 DIAGNOSIS — J984 Other disorders of lung: Secondary | ICD-10-CM | POA: Diagnosis not present

## 2023-06-15 DIAGNOSIS — I7121 Aneurysm of the ascending aorta, without rupture: Secondary | ICD-10-CM | POA: Diagnosis present

## 2023-06-15 DIAGNOSIS — R918 Other nonspecific abnormal finding of lung field: Secondary | ICD-10-CM | POA: Diagnosis not present

## 2023-06-15 DIAGNOSIS — F32A Depression, unspecified: Secondary | ICD-10-CM | POA: Diagnosis present

## 2023-06-15 DIAGNOSIS — J440 Chronic obstructive pulmonary disease with acute lower respiratory infection: Secondary | ICD-10-CM | POA: Diagnosis not present

## 2023-06-15 DIAGNOSIS — J9600 Acute respiratory failure, unspecified whether with hypoxia or hypercapnia: Secondary | ICD-10-CM | POA: Diagnosis not present

## 2023-06-15 DIAGNOSIS — Z515 Encounter for palliative care: Secondary | ICD-10-CM | POA: Diagnosis not present

## 2023-06-15 DIAGNOSIS — I700 Atherosclerosis of aorta: Secondary | ICD-10-CM | POA: Diagnosis not present

## 2023-06-15 DIAGNOSIS — E872 Acidosis, unspecified: Secondary | ICD-10-CM | POA: Diagnosis not present

## 2023-06-15 DIAGNOSIS — J432 Centrilobular emphysema: Secondary | ICD-10-CM | POA: Diagnosis not present

## 2023-06-15 DIAGNOSIS — J9601 Acute respiratory failure with hypoxia: Secondary | ICD-10-CM

## 2023-06-15 DIAGNOSIS — J9811 Atelectasis: Secondary | ICD-10-CM | POA: Diagnosis not present

## 2023-06-15 DIAGNOSIS — M4856XA Collapsed vertebra, not elsewhere classified, lumbar region, initial encounter for fracture: Secondary | ICD-10-CM | POA: Diagnosis not present

## 2023-06-15 DIAGNOSIS — I129 Hypertensive chronic kidney disease with stage 1 through stage 4 chronic kidney disease, or unspecified chronic kidney disease: Secondary | ICD-10-CM | POA: Diagnosis present

## 2023-06-15 DIAGNOSIS — E7800 Pure hypercholesterolemia, unspecified: Secondary | ICD-10-CM | POA: Diagnosis present

## 2023-06-15 DIAGNOSIS — I159 Secondary hypertension, unspecified: Secondary | ICD-10-CM | POA: Diagnosis not present

## 2023-06-15 DIAGNOSIS — Z7189 Other specified counseling: Secondary | ICD-10-CM | POA: Diagnosis not present

## 2023-06-15 DIAGNOSIS — R109 Unspecified abdominal pain: Secondary | ICD-10-CM | POA: Diagnosis not present

## 2023-06-15 DIAGNOSIS — M25512 Pain in left shoulder: Secondary | ICD-10-CM | POA: Diagnosis not present

## 2023-06-15 LAB — I-STAT VENOUS BLOOD GAS, ED
Acid-base deficit: 8 mmol/L — ABNORMAL HIGH (ref 0.0–2.0)
Bicarbonate: 16.4 mmol/L — ABNORMAL LOW (ref 20.0–28.0)
Calcium, Ion: 1.13 mmol/L — ABNORMAL LOW (ref 1.15–1.40)
HCT: 32 % — ABNORMAL LOW (ref 39.0–52.0)
Hemoglobin: 10.9 g/dL — ABNORMAL LOW (ref 13.0–17.0)
O2 Saturation: 88 %
Potassium: 3.6 mmol/L (ref 3.5–5.1)
Sodium: 138 mmol/L (ref 135–145)
TCO2: 17 mmol/L — ABNORMAL LOW (ref 22–32)
pCO2, Ven: 29.9 mmHg — ABNORMAL LOW (ref 44–60)
pH, Ven: 7.346 (ref 7.25–7.43)
pO2, Ven: 57 mmHg — ABNORMAL HIGH (ref 32–45)

## 2023-06-15 LAB — LACTIC ACID, PLASMA
Lactic Acid, Venous: 6.2 mmol/L (ref 0.5–1.9)
Lactic Acid, Venous: 5 mmol/L (ref 0.5–1.9)

## 2023-06-15 LAB — COMPREHENSIVE METABOLIC PANEL
ALT: 15 U/L (ref 0–44)
AST: 27 U/L (ref 15–41)
Albumin: 2.8 g/dL — ABNORMAL LOW (ref 3.5–5.0)
Alkaline Phosphatase: 43 U/L (ref 38–126)
Anion gap: 12 (ref 5–15)
BUN: 22 mg/dL (ref 8–23)
CO2: 17 mmol/L — ABNORMAL LOW (ref 22–32)
Calcium: 8.1 mg/dL — ABNORMAL LOW (ref 8.9–10.3)
Chloride: 109 mmol/L (ref 98–111)
Creatinine, Ser: 1.49 mg/dL — ABNORMAL HIGH (ref 0.61–1.24)
GFR, Estimated: 46 mL/min — ABNORMAL LOW (ref >60)
Glucose, Bld: 192 mg/dL — ABNORMAL HIGH (ref 70–99)
Potassium: 3.6 mmol/L (ref 3.5–5.1)
Sodium: 138 mmol/L (ref 135–145)
Total Bilirubin: 0.6 mg/dL (ref 0.0–1.2)
Total Protein: 5.6 g/dL — ABNORMAL LOW (ref 6.5–8.1)

## 2023-06-15 LAB — URINALYSIS, W/ REFLEX TO CULTURE (INFECTION SUSPECTED)
Bacteria, UA: NONE SEEN
Bilirubin Urine: NEGATIVE
Glucose, UA: NEGATIVE mg/dL
Hgb urine dipstick: NEGATIVE
Ketones, ur: NEGATIVE mg/dL
Leukocytes,Ua: NEGATIVE
Nitrite: NEGATIVE
Protein, ur: NEGATIVE mg/dL
Specific Gravity, Urine: 1.01 (ref 1.005–1.030)
pH: 5.5 (ref 5.0–8.0)

## 2023-06-15 LAB — BLOOD GAS, VENOUS
Acid-base deficit: 4.5 mmol/L — ABNORMAL HIGH (ref 0.0–2.0)
Bicarbonate: 20.3 mmol/L (ref 20.0–28.0)
O2 Saturation: 59.4 %
Patient temperature: 37
pCO2, Ven: 36 mmHg — ABNORMAL LOW (ref 44–60)
pH, Ven: 7.36 (ref 7.25–7.43)
pO2, Ven: 33 mmHg (ref 32–45)

## 2023-06-15 LAB — CBC WITH DIFFERENTIAL/PLATELET
Abs Immature Granulocytes: 0.11 10*3/uL — ABNORMAL HIGH (ref 0.00–0.07)
Basophils Absolute: 0 10*3/uL (ref 0.0–0.1)
Basophils Relative: 0 %
Eosinophils Absolute: 0 10*3/uL (ref 0.0–0.5)
Eosinophils Relative: 0 %
HCT: 32.3 % — ABNORMAL LOW (ref 39.0–52.0)
Hemoglobin: 10.6 g/dL — ABNORMAL LOW (ref 13.0–17.0)
Immature Granulocytes: 1 %
Lymphocytes Relative: 1 %
Lymphs Abs: 0.1 10*3/uL — ABNORMAL LOW (ref 0.7–4.0)
MCH: 32.7 pg (ref 26.0–34.0)
MCHC: 32.8 g/dL (ref 30.0–36.0)
MCV: 99.7 fL (ref 80.0–100.0)
Monocytes Absolute: 0.3 10*3/uL (ref 0.1–1.0)
Monocytes Relative: 3 %
Neutro Abs: 11.3 10*3/uL — ABNORMAL HIGH (ref 1.7–7.7)
Neutrophils Relative %: 95 %
Platelets: 153 10*3/uL (ref 150–400)
RBC: 3.24 MIL/uL — ABNORMAL LOW (ref 4.22–5.81)
RDW: 14.4 % (ref 11.5–15.5)
WBC: 11.9 10*3/uL — ABNORMAL HIGH (ref 4.0–10.5)
nRBC: 0 % (ref 0.0–0.2)

## 2023-06-15 LAB — I-STAT CG4 LACTIC ACID, ED
Lactic Acid, Venous: 4.1 mmol/L (ref 0.5–1.9)
Lactic Acid, Venous: 7.2 mmol/L (ref 0.5–1.9)

## 2023-06-15 LAB — MAGNESIUM: Magnesium: 1.6 mg/dL — ABNORMAL LOW (ref 1.7–2.4)

## 2023-06-15 LAB — GLUCOSE, CAPILLARY: Glucose-Capillary: 87 mg/dL (ref 70–99)

## 2023-06-15 LAB — BRAIN NATRIURETIC PEPTIDE: B Natriuretic Peptide: 150.5 pg/mL — ABNORMAL HIGH (ref 0.0–100.0)

## 2023-06-15 LAB — TROPONIN I (HIGH SENSITIVITY)
Troponin I (High Sensitivity): 12 ng/L (ref <18)
Troponin I (High Sensitivity): 14 ng/L (ref <18)

## 2023-06-15 LAB — PROCALCITONIN: Procalcitonin: 4.42 ng/mL

## 2023-06-15 LAB — PHOSPHORUS: Phosphorus: 3 mg/dL (ref 2.5–4.6)

## 2023-06-15 MED ORDER — ACETAMINOPHEN 325 MG PO TABS
650.0000 mg | ORAL_TABLET | Freq: Four times a day (QID) | ORAL | Status: DC | PRN
  Administered 2023-06-17 – 2023-06-23 (×4): 650 mg via ORAL
  Filled 2023-06-15 (×5): qty 2

## 2023-06-15 MED ORDER — LACTATED RINGERS IV SOLN
INTRAVENOUS | Status: DC
Start: 2023-06-15 — End: 2023-06-15

## 2023-06-15 MED ORDER — CITALOPRAM HYDROBROMIDE 20 MG PO TABS
20.0000 mg | ORAL_TABLET | Freq: Every day | ORAL | Status: DC
  Administered 2023-06-15 – 2023-06-25 (×11): 20 mg via ORAL
  Filled 2023-06-15 (×11): qty 1

## 2023-06-15 MED ORDER — AMLODIPINE BESYLATE 5 MG PO TABS
5.0000 mg | ORAL_TABLET | Freq: Every day | ORAL | Status: DC
  Administered 2023-06-15 – 2023-06-26 (×12): 5 mg via ORAL
  Filled 2023-06-15 (×12): qty 1

## 2023-06-15 MED ORDER — MENTHOL 3 MG MT LOZG: 1.0000 | LOZENGE | OROMUCOSAL | Status: DC | PRN

## 2023-06-15 MED ORDER — GUAIFENESIN-DM 100-10 MG/5ML PO SYRP
5.0000 mL | ORAL_SOLUTION | ORAL | Status: DC | PRN
Start: 2023-06-15 — End: 2023-06-20
  Administered 2023-06-15 – 2023-06-18 (×6): 5 mL via ORAL
  Filled 2023-06-15 (×6): qty 5

## 2023-06-15 MED ORDER — ATORVASTATIN CALCIUM 10 MG PO TABS
20.0000 mg | ORAL_TABLET | Freq: Every day | ORAL | Status: DC
  Administered 2023-06-15 – 2023-06-26 (×12): 20 mg via ORAL
  Filled 2023-06-15 (×12): qty 2

## 2023-06-15 MED ORDER — VITAMIN B-12 1000 MCG PO TABS
1000.0000 ug | ORAL_TABLET | Freq: Every day | ORAL | Status: DC
  Administered 2023-06-15 – 2023-06-26 (×12): 1000 ug via ORAL
  Filled 2023-06-15 (×12): qty 1

## 2023-06-15 MED ORDER — TAMSULOSIN HCL 0.4 MG PO CAPS
0.4000 mg | ORAL_CAPSULE | Freq: Every day | ORAL | Status: DC
  Administered 2023-06-15 – 2023-06-26 (×12): 0.4 mg via ORAL
  Filled 2023-06-15 (×12): qty 1

## 2023-06-15 MED ORDER — IPRATROPIUM-ALBUTEROL 0.5-2.5 (3) MG/3ML IN SOLN
3.0000 mL | Freq: Four times a day (QID) | RESPIRATORY_TRACT | Status: DC | PRN
  Administered 2023-06-17 – 2023-06-18 (×2): 3 mL via RESPIRATORY_TRACT
  Filled 2023-06-15 (×2): qty 3

## 2023-06-15 MED ORDER — LACTATED RINGERS IV BOLUS (SEPSIS)
1000.0000 mL | Freq: Once | INTRAVENOUS | Status: AC
  Administered 2023-06-15: 1000 mL via INTRAVENOUS

## 2023-06-15 MED ORDER — PANTOPRAZOLE SODIUM 40 MG PO TBEC
40.0000 mg | DELAYED_RELEASE_TABLET | Freq: Every day | ORAL | Status: DC
Start: 2023-06-15 — End: 2023-06-24
  Administered 2023-06-15 – 2023-06-23 (×9): 40 mg via ORAL
  Filled 2023-06-15 (×10): qty 1

## 2023-06-15 MED ORDER — PHENOL 1.4 % MT LIQD
1.0000 | OROMUCOSAL | Status: DC | PRN
  Filled 2023-06-15: qty 177

## 2023-06-15 MED ORDER — SODIUM CHLORIDE 0.9 % IV SOLN
500.0000 mg | INTRAVENOUS | Status: DC
  Administered 2023-06-15 – 2023-06-16 (×2): 500 mg via INTRAVENOUS
  Filled 2023-06-15 (×2): qty 5

## 2023-06-15 MED ORDER — POTASSIUM CHLORIDE 10 MEQ/100ML IV SOLN
10.0000 meq | INTRAVENOUS | Status: AC
Start: 2023-06-15 — End: 2023-06-15
  Administered 2023-06-15 (×2): 10 meq via INTRAVENOUS
  Filled 2023-06-15 (×2): qty 100

## 2023-06-15 MED ORDER — ADULT MULTIVITAMIN W/MINERALS CH
1.0000 | ORAL_TABLET | Freq: Every day | ORAL | Status: DC
  Administered 2023-06-15 – 2023-06-26 (×12): 1 via ORAL
  Filled 2023-06-15 (×12): qty 1

## 2023-06-15 MED ORDER — MELATONIN 5 MG PO TABS
5.0000 mg | ORAL_TABLET | Freq: Every evening | ORAL | Status: DC | PRN
  Administered 2023-06-16 – 2023-06-25 (×8): 5 mg via ORAL
  Filled 2023-06-15 (×8): qty 1

## 2023-06-15 MED ORDER — LACTATED RINGERS IV BOLUS (SEPSIS)
500.0000 mL | Freq: Once | INTRAVENOUS | Status: AC
  Administered 2023-06-15: 500 mL via INTRAVENOUS

## 2023-06-15 MED ORDER — POLYETHYLENE GLYCOL 3350 17 G PO PACK
17.0000 g | PACK | Freq: Every day | ORAL | Status: DC | PRN
  Administered 2023-06-19: 17 g via ORAL
  Filled 2023-06-15: qty 1

## 2023-06-15 MED ORDER — HYDRALAZINE HCL 25 MG PO TABS: 25.0000 mg | ORAL_TABLET | Freq: Four times a day (QID) | ORAL | Status: DC | PRN

## 2023-06-15 MED ORDER — MAGNESIUM SULFATE 2 GM/50ML IV SOLN
2.0000 g | Freq: Once | INTRAVENOUS | Status: AC
  Administered 2023-06-15: 2 g via INTRAVENOUS
  Filled 2023-06-15: qty 50

## 2023-06-15 MED ORDER — PROCHLORPERAZINE EDISYLATE 10 MG/2ML IJ SOLN
5.0000 mg | Freq: Four times a day (QID) | INTRAMUSCULAR | Status: DC | PRN
  Administered 2023-06-18: 5 mg via INTRAVENOUS
  Filled 2023-06-15: qty 2

## 2023-06-15 MED ORDER — RIVAROXABAN 10 MG PO TABS
10.0000 mg | ORAL_TABLET | Freq: Every day | ORAL | Status: DC
  Administered 2023-06-15 – 2023-06-26 (×12): 10 mg via ORAL
  Filled 2023-06-15 (×13): qty 1

## 2023-06-15 MED ORDER — ORAL CARE MOUTH RINSE: 15.0000 mL | OROMUCOSAL | Status: DC | PRN

## 2023-06-15 MED ORDER — SODIUM CHLORIDE 0.9 % IV SOLN
2.0000 g | INTRAVENOUS | Status: DC
  Administered 2023-06-15 – 2023-06-16 (×2): 2 g via INTRAVENOUS
  Filled 2023-06-15 (×2): qty 20

## 2023-06-15 NOTE — Evaluation (Signed)
 Occupational Therapy Evaluation Patient Details Name: Steven Guerrero MRN: [id number] DOB: [birth year] Today's Date: 06/15/2023   History of Present Illness   Pt is 83 yo male who presents on 06/15/23 with SOB, wheezing, and productive cough. CT concerning for PNA. Code sepsis called in ED. PMH: CVA, TBI 2003, PE, OSA not on CPAP, CKD3, GERD, COPD/ emphysema, chronic bronchitis, lung nodules, R THA     Clinical Impressions Pt admitted based on above, and was seen based on problem list below. PTA pt was living with his wife receiving min assistance with ADLs and IADLs Today pt is requiring set up  to mod for  ADLs. Bed mobility completed with CGA and functional transfers are  mod assist to boost. Pt limited by fatigue and activity tolerance. Per wife she reports that cog appears at baseline, with decreased safety awareness. Recommendation of HHOT to optimize independence levels. OT will continue to follow acutely to maximize functional independence.        If plan is discharge home, recommend the following:   A little help with bathing/dressing/bathroom;A lot of help with walking and/or transfers;Assistance with cooking/housework;Direct supervision/assist for medications management;Direct supervision/assist for financial management;Help with stairs or ramp for entrance;Supervision due to cognitive status     Functional Status Assessment   Patient has had a recent decline in their functional status and demonstrates the ability to make significant improvements in function in a reasonable and predictable amount of time.     Equipment Recommendations   None recommended by OT (All DME needs met)     Recommendations for Other Services         Precautions/Restrictions   Precautions Precautions: Fall Recall of Precautions/Restrictions: Intact Precaution/Restrictions Comments: fell 2x in Feb Restrictions Weight Bearing Restrictions Per Provider Order: No     Mobility Bed  Mobility Overal bed mobility: Needs Assistance Bed Mobility: Supine to Sit, Sit to Supine     Supine to sit: Contact guard, HOB elevated, Used rails Sit to supine: Contact guard assist, Used rails, HOB elevated   General bed mobility comments: Increased time    Transfers Overall transfer level: Needs assistance Equipment used: Rolling walker (2 wheels) Transfers: Sit to/from Stand Sit to Stand: Mod assist           General transfer comment: mod A to power up. Pt fatigued with each stand      Balance Overall balance assessment: Needs assistance Sitting-balance support: Feet supported, No upper extremity supported Sitting balance-Leahy Scale: Fair   Postural control: Posterior lean Standing balance support: Bilateral upper extremity supported Standing balance-Leahy Scale: Poor Standing balance comment: Reliant on RW                           ADL either performed or assessed with clinical judgement   ADL Overall ADL's : Needs assistance/impaired Eating/Feeding: Set up;Sitting   Grooming: Set up;Sitting           Upper Body Dressing : Set up;Minimal assistance;Sitting   Lower Body Dressing: Moderate assistance;Sit to/from stand Lower Body Dressing Details (indicate cue type and reason): Wife states uses sock aid at baseline Toilet Transfer: Moderate assistance;Stand-pivot;BSC/3in1;Rolling walker (2 wheels) Toilet Transfer Details (indicate cue type and reason): Mod assist to boost Toileting- Clothing Manipulation and Hygiene: Maximal assistance;Cueing for safety;Cueing for sequencing;Sit to/from stand       Functional mobility during ADLs: Moderate assistance;Rolling walker (2 wheels)       Vision Baseline Vision/History: 0 No  visual deficits Vision Assessment?: No apparent visual deficits     Perception         Praxis         Pertinent Vitals/Pain Pain Assessment Pain Assessment: Faces Faces Pain Scale: Hurts little more Pain  Location: throat, chest Pain Descriptors / Indicators: Aching, Sore Pain Intervention(s): Limited activity within patient's tolerance, Monitored during session, Repositioned     Extremity/Trunk Assessment Upper Extremity Assessment Upper Extremity Assessment: Generalized weakness;Right hand dominant;RUE deficits/detail RUE Deficits / Details: Decreased ROM from previous stroke, but [facility] RUE Coordination: decreased gross motor   Lower Extremity Assessment Lower Extremity Assessment: Defer to PT evaluation   Cervical / Trunk Assessment Cervical / Trunk Assessment: Kyphotic   Communication Communication Communication: Impaired Factors Affecting Communication: Reduced clarity of speech   Cognition Arousal: Alert Behavior During Therapy: WFL for tasks assessed/performed Cognition: History of cognitive impairments, Cognition impaired   Orientation impairments: Situation Awareness: Intellectual awareness intact, Online awareness impaired Memory impairment (select all impairments): Short-term memory, Working memory Attention impairment (select first level of impairment): Selective attention Executive functioning impairment (select all impairments): Organization, Sequencing, Problem solving, Reasoning OT - Cognition Comments: Per wife pt is at baseline                 Following commands: Intact       Cueing  General Comments   Cueing Techniques: Verbal cues;Gestural cues  SPO2 dropped to 86 on 2L [place] with mobility, pt with wet cough, and spitting sputum into cup   Exercises     Shoulder Instructions      Home Living Family/patient expects to be discharged to:: Private residence Living Arrangements: Spouse/significant other Available Help at Discharge: Family;Available PRN/intermittently Type of Home: House Home Access: Stairs to enter [employer] of Steps: 1   Home Layout: One level     Bathroom Shower/Tub: Producer, television/film/video:  Handicapped height Bathroom Accessibility: Yes How Accessible: Accessible via walker Home Equipment: Rolling Walker (2 wheels);Rollator (4 wheels);BSC/3in1;Shower seat;Grab bars - toilet;Grab bars - tub/shower;Wheelchair - Careers adviser (comment);Hand held shower head (Adjustable bed)   Additional Comments: lives with wife      Prior Functioning/Environment Prior Level of Function : Needs assist             Mobility Comments: ambulates with rollator ADLs Comments: Wife occasionally helps with socks    OT Problem List: Decreased strength;Decreased range of motion;Decreased activity tolerance;Impaired balance (sitting and/or standing);Decreased safety awareness;Decreased knowledge of use of DME or AE   OT Treatment/Interventions: Self-care/ADL training;Therapeutic exercise;DME and/or AE instruction;Patient/family education;Balance training;Therapeutic activities;Cognitive remediation/compensation      OT Goals(Current goals can be found in the care plan section)   Acute Rehab OT Goals Patient Stated Goal: To go home OT Goal Formulation: With patient Time For Goal Achievement: 06/29/23 Potential to Achieve Goals: Good   OT Frequency:  Min 2X/week    Co-evaluation              AM-PAC OT "6 Clicks" Daily Activity     Outcome Measure Help from another person eating meals?: None Help from another person taking care of personal grooming?: A Little Help from another person toileting, which includes using toliet, bedpan, or urinal?: A Lot Help from another person bathing (including washing, rinsing, drying)?: A Lot Help from another person to put on and taking off regular upper body clothing?: A Little Help from another person to put on and taking off regular lower body clothing?: A Lot 6 Click  Score: 16   End of Session Equipment Utilized During Treatment: Gait belt;Rolling walker (2 wheels) Nurse Communication: Mobility status  Activity Tolerance: Patient limited by  fatigue Patient left: in bed;with call bell/phone within reach;with bed alarm set;with family/visitor present  OT Visit Diagnosis: Unsteadiness on feet (R26.81);Other abnormalities of gait and mobility (R26.89);Repeated falls (R29.6);Muscle weakness (generalized) (M62.81);History of falling (Z91.81)                Time: 2956-2130 OT Time Calculation (min): 22 min Charges:  OT General Charges $OT Visit: 1 Visit OT Evaluation $OT Eval Moderate Complexity: 1 Mod OT Treatments $Self Care/Home Management : 8-22 mins  Ivor Messier, OT  Acute Rehabilitation Services Office [phone] Secure chat preferred   Steven Guerrero 06/15/2023, 1:59 PM

## 2023-06-15 NOTE — Progress Notes (Signed)
 PROGRESS NOTE    Steven Guerrero  UJW:[id number] DOB: 1941/01/07 DOA: 06/14/2023 PCP: Merri Brunette, MD    Brief Narrative:   Steven Guerrero is a 83 y.o. male with past medical history significant for CVA, TBI, history of PE on Xarelto, CKD stage IIIb, COPD/emphysema, chronic bronchitis, subcentimeter lung nodules, OSA not on CPAP, GERD who presented to [facility] ED on 06/14/2023 from home via EMS with complaint of sudden onset shortness of breath, productive cough, wheezing.  Also notable for 1 episode of vomiting.  On EMS arrival, patient was noted to be febrile with a Tmax of 103.0 F.  Patient took Tylenol and received albuterol neb, Atrovent neb, IV Solu-Medrol 125 mg x 1 by EMS prior to ED arrival.  In the ED, temperature 98.9 F (103 with EMS), HR 131, RR 34, BP 145/61, SpO2 97% on 4 L aerosol mask.  WBC 10.1, hemoglobin 12.9, platelet count 163.  Sodium 139, potassium 3.6, chloride 106, CO2 20, glucose 147, BUN 24, creatinine 1.56.  AST 23, ALT 18, total bilirubin 1.1.  BNP 150.5.  High-sensitivity troponin 14.  Lactic acid 2.4>3.3>>7.2>6.2>5.0.  Procalcitonin 4.42.  Urinalysis negative.  COVID/influenza/RSV PCR negative.  CT chest/abdomen/pelvis with evidence of chronic lung disease with emphysema, chronic bronchitic changes, subpleural fibrosis, developing infiltrates lung bases consistent with pneumonia, 4.4 cm diameter thoracic ascending aortic aneurysm, cholelithiasis without acute cholecystitis, aortic atherosclerosis, enlarged prostate gland, L2 compression fracture.  Patient was started on azithromycin and ceftriaxone.  Received IV fluid bolus.  TRH consulted for admission for further evaluation management of sepsis secondary to community-acquired pneumonia.  Assessment & Plan:   Sepsis, POA Community-acquired pneumonia Lactic acidosis Patient presenting with sudden onset shortness of breath, productive cough associated with wheezing.  On EMS arrival, Tmax was noted to be 103.0  F.  Patient with tachycardia, tachypnea requiring 4 L aerosol mask on arrival in which she is not oxygen dependent at baseline.  Elevated WBC with lactic acidosis.  Procalcitonin 4.42. -- Azithromycin 500 mg IV every 24 hours x 5 days -- Ceftriaxone 2 g IV every 24 hours -- CBC daily, monitor fever curve, repeat lactic acid in the am  Hypomagnesemia Repleted. -- Monitor electrolytes closely daily  Dysphagia Per spouse, has issues with choking on his food. -- SLP evaluation -- Aspiration precautions  Essential hypertension At baseline on amlodipine 5 mg p.o. daily, losartan 100 mg p.o. daily. -- Restart amlodipine 5 mg p.o. daily --Hold home losartan for now -- Hydralazine 25 g p.o. every 6 hours as needed SBP >165  History of PE -- Xarelto 10 mg p.o. daily  CKD stage IIIb -- Cr 1.49, stable and at baseline -- Repeat BMP in a.m.  COPD/emphysema Not oxygen dependent at baseline, not on any controlling medications. -- duonebs q6h PRN SOB/wheezing  BPH -- Tamsulosin 0.4 mg p.o. daily  Depression/anxiety -- Celexa 20 mg p.o. nightly  Hyperlipidemia -- Atorvastatin 20 m p.o. daily  OSA -- Nocturnal CPAP     DVT prophylaxis: rivaroxaban (XARELTO) tablet 10 mg Start: 06/15/23 1000 rivaroxaban (XARELTO) tablet 10 mg    Code Status: Full Code Family Communication: Updated spouse present bedside this morning  Disposition Plan:  Level of care: Progressive Status is: Inpatient Remains inpatient appropriate because: IV antibiotics    Consultants:  None  Procedures:  None  Antimicrobials:  Azithromycin Ceftriaxone   Subjective: Patient seen examined bedside, lying in bed.  Spouse present at bedside.  Continues to endorse shortness of breath, productive cough.  No further fevers.  Continues on IV antibiotics.  Spouse concerned about his congestion, cough.  Also with dysphagia.  No other specific questions, concerns or complaints at this time.  Denies headache, no  vision changes, no chest pain, no palpitations, no abdominal pain, no fever/chills/night sweats, no current nausea/vomiting, no diarrhea, no focal weakness, no fatigue, no paresthesia.  No acute events overnight per nursing.  Objective: Vitals:   06/15/23 1145 06/15/23 1147 06/15/23 1220 06/15/23 1544  BP: (!) 172/86  (!) 169/88 (!) 155/82  Pulse: 95  96 74  Resp: (!) 26  (!) 26 17  Temp:  98.6 F (37 C)  98.1 F (36.7 C)  TempSrc:    Oral  SpO2: 93%  92% 92%    Intake/Output Summary (Last 24 hours) at 06/15/2023 1559 Last data filed at 06/15/2023 1249 Gross per 24 hour  Intake 5080 ml  Output 700 ml  Net 4380 ml   There were no vitals filed for this visit.  Examination:  Physical Exam: GEN: NAD, alert and oriented x 3, elderly in appearance HEENT: NCAT, PERRL, EOMI, sclera clear, MMM, poor dentition PULM: Breath sounds diminished bowel bases with crackles, wheezing, rhonchi, normal respiratory effort without accessory muscle use, on 2 L nasal cannula. CV: RRR w/o M/G/R GI: abd soft, NTND, NABS, no R/G/M MSK: no peripheral edema, moves all extremities independently NEURO: No focal neurological deficit PSYCH: normal mood/affect Integumentary: No concerning rashes/lesions/wounds noted on exposed skin surfaces.    Data Reviewed: I have personally reviewed following labs and imaging studies  CBC: Recent Labs  Lab 06/14/23 2006 06/15/23 0321 06/15/23 0352  WBC 10.1 11.9*  --   NEUTROABS 7.9* 11.3*  --   HGB 12.9* 10.6* 10.9*  HCT 39.0 32.3* 32.0*  MCV 98.5 99.7  --   PLT 163 153  --    Basic Metabolic Panel: Recent Labs  Lab 06/14/23 2006 06/15/23 0321 06/15/23 0352  NA 139 138 138  K 3.6 3.6 3.6  CL 106 109  --   CO2 20* 17*  --   GLUCOSE 147* 192*  --   BUN 24* 22  --   CREATININE 1.56* 1.49*  --   CALCIUM 9.2 8.1*  --   MG  --  1.6*  --   PHOS  --  3.0  --    GFR: Estimated Creatinine Clearance: 40 mL/min (A) (by C-G formula based on SCr of 1.49  mg/dL (H)). Liver Function Tests: Recent Labs  Lab 06/14/23 2006 06/15/23 0321  AST 23 27  ALT 18 15  ALKPHOS 54 43  BILITOT 1.1 0.6  PROT 7.1 5.6*  ALBUMIN 3.7 2.8*   No results for input(s): "LIPASE", "AMYLASE" in the last 168 hours. No results for input(s): "AMMONIA" in the last 168 hours. Coagulation Profile: Recent Labs  Lab 06/14/23 2006  INR 1.2   Cardiac Enzymes: No results for input(s): "CKTOTAL", "CKMB", "CKMBINDEX", "TROPONINI" in the last 168 hours. BNP (last 3 results) No results for input(s): "PROBNP" in the last 8760 hours. HbA1C: No results for input(s): "HGBA1C" in the last 72 hours. CBG: No results for input(s): "GLUCAP" in the last 168 hours. Lipid Profile: No results for input(s): "CHOL", "HDL", "LDLCALC", "TRIG", "CHOLHDL", "LDLDIRECT" in the last 72 hours. Thyroid Function Tests: No results for input(s): "TSH", "T4TOTAL", "FREET4", "T3FREE", "THYROIDAB" in the last 72 hours. Anemia Panel: No results for input(s): "VITAMINB12", "FOLATE", "FERRITIN", "TIBC", "IRON", "RETICCTPCT" in the last 72 hours. Sepsis Labs: Recent Labs  Lab  06/15/23 0031 06/15/23 0251 06/15/23 0321 06/15/23 0546  PROCALCITON  --   --  4.42  --   LATICACIDVEN 4.1* 7.2* 6.2* 5.0*    Recent Results (from the past 240 hours)  Blood Culture (routine x 2)     Status: None (Preliminary result)   Collection Time: 06/14/23  8:06 PM   Specimen: BLOOD  Result Value Ref Range Status   Specimen Description BLOOD RIGHT ARM  Final   Special Requests   Final    BOTTLES DRAWN AEROBIC AND ANAEROBIC Blood Culture adequate volume   Culture   Final    NO GROWTH < 12 HOURS Performed at [facility], [address], [place], [place] [address]    Report Status PENDING  Incomplete  Resp panel by RT-PCR (RSV, Flu A&B, Covid) Anterior Nasal Swab     Status: None   Collection Time: 06/14/23  8:27 PM   Specimen: Anterior Nasal Swab  Result Value Ref Range Status   SARS Coronavirus 2  by RT PCR NEGATIVE NEGATIVE Final   Influenza A by PCR NEGATIVE NEGATIVE Final   Influenza B by PCR NEGATIVE NEGATIVE Final    Comment: (NOTE) The Xpert Xpress SARS-CoV-2/FLU/RSV plus assay is intended as an aid in the diagnosis of influenza from Nasopharyngeal swab specimens and should not be used as a sole basis for treatment. Nasal washings and aspirates are unacceptable for Xpert Xpress SARS-CoV-2/FLU/RSV testing.  Fact Sheet for Patients: BloggerCourse.com  Fact Sheet for Healthcare Providers: SeriousBroker.it  This test is not yet approved or cleared by the [place] FDA and has been authorized for detection and/or diagnosis of SARS-CoV-2 by FDA under an Emergency Use Authorization (EUA). This EUA will remain in effect (meaning this test can be used) for the duration of the COVID-19 declaration under Section 564(b)(1) of the Act, 21 U.S.C. section 360bbb-3(b)(1), unless the authorization is terminated or revoked.     Resp Syncytial Virus by PCR NEGATIVE NEGATIVE Final    Comment: (NOTE) Fact Sheet for Patients: BloggerCourse.com  Fact Sheet for Healthcare Providers: SeriousBroker.it  This test is not yet approved or cleared by the [place] FDA and has been authorized for detection and/or diagnosis of SARS-CoV-2 by FDA under an Emergency Use Authorization (EUA). This EUA will remain in effect (meaning this test can be used) for the duration of the COVID-19 declaration under Section 564(b)(1) of the Act, 21 U.S.C. section 360bbb-3(b)(1), unless the authorization is terminated or revoked.  Performed at [facility], [address] [place], [place] [address]   Blood Culture (routine x 2)     Status: None (Preliminary result)   Collection Time: 06/14/23  8:30 PM   Specimen: BLOOD  Result Value Ref Range Status   Specimen Description BLOOD LEFT ARM  Final    Special Requests   Final    BOTTLES DRAWN AEROBIC AND ANAEROBIC Blood Culture adequate volume   Culture   Final    NO GROWTH < 12 HOURS Performed at [facility], [address] [place], [place] [address]    Report Status PENDING  Incomplete         Radiology Studies: CT CHEST ABDOMEN PELVIS W CONTRAST Result Date: 06/14/2023 CLINICAL DATA:  Shortness of breath.  Sepsis. EXAM: CT CHEST, ABDOMEN, AND PELVIS WITH CONTRAST TECHNIQUE: Multidetector CT imaging of the chest, abdomen and pelvis was performed following the standard protocol during bolus administration of intravenous contrast. RADIATION DOSE REDUCTION: This exam was performed according  to the departmental dose-optimization program which includes automated exposure control, adjustment of the mA and/or kV according to patient size and/or use of iterative reconstruction technique. CONTRAST:  75mL OMNIPAQUE IOHEXOL 350 MG/ML SOLN COMPARISON:  Chest radiograph 06/14/2023. CT chest abdomen and pelvis 12/04/2020 FINDINGS: CT CHEST FINDINGS Cardiovascular: Cardiac enlargement. Trace pericardial effusion. Ascending aortic aneurysm measuring 4.4 cm diameter. No significant change since prior study by my measurement. No aortic dissection. Calcification of the aorta and coronary arteries. Great vessel origins are patent. Mediastinum/Nodes: Thyroid gland is unremarkable. Esophagus is decompressed. No significant lymphadenopathy. Lungs/Pleura: Bilateral calcified pleural plaques may indicate prior asbestos exposure. Mild subpleural and apical scarring. Increased opacities in the lung bases may represent atelectasis or active alveolitis in the setting of fibrosis. Emphysematous changes throughout the lungs. Bronchial wall thickening suggesting chronic bronchitis. No pleural effusion or pneumothorax. Musculoskeletal: No chest wall mass or suspicious bone lesions identified. Old rib fractures. CT ABDOMEN PELVIS FINDINGS Hepatobiliary: Cholelithiasis.  No inflammatory changes. No bile duct dilatation. No focal liver lesions. Pancreas: Unremarkable. No pancreatic ductal dilatation or surrounding inflammatory changes. Spleen: Normal in size without focal abnormality. Calcified granulomas. Adrenals/Urinary Tract: Prominent bilateral renal artery and intrarenal arterial calcifications. Nephrograms are symmetrical. Subcentimeter cysts without change. No imaging follow-up is indicated. No hydronephrosis or hydroureter. Bladder wall trabeculation and cellule formation likely representing chronic outlet obstruction. Stomach/Bowel: Stomach, small bowel, and colon are not abnormally distended. No wall thickening or inflammatory changes. Scattered colonic diverticula without evidence of acute diverticulitis. Appendix is not identified. Vascular/Lymphatic: Aortic atherosclerosis. No enlarged abdominal or pelvic lymph nodes. Reproductive: Prostate gland is enlarged. Other: Small left inguinal hernia containing fat. No free air or free fluid in the abdomen. Musculoskeletal: Anterior compression of the L2 vertebra with about 50% loss of height. This is new since prior study and could represent an acute fracture. Degenerative changes in the lumbar spine. No destructive bone lesions. Degenerative changes in the left hip. Prior right hip replacement. IMPRESSION: 1. Evidence of chronic lung disease with emphysema, chronic bronchitic changes, and subpleural fibrosis. 2. Developing infiltrates in the lung bases, possibly pneumonia or atelectasis. 3. Calcified pleural plaques bilaterally. 4. 4.4 cm diameter thoracic ascending aortic aneurysm. Recommend annual imaging followup by CTA or MRA. This recommendation follows 2010 ACCF/AHA/AATS/ACR/ASA/SCA/SCAI/SIR/STS/SVM Guidelines for the Diagnosis and Management of Patients with Thoracic Aortic Disease. Circulation. 2010; 121: W098-J191. Aortic aneurysm NOS (ICD10-I71.9) 5. Cholelithiasis without evidence of acute cholecystitis. 6. Aortic  atherosclerosis. 7. Enlarged prostate gland. Cellule formation in the bladder may indicate chronic outlet obstruction. 8. Anterior compression of the L2 vertebra, new since prior study and possibly acute. Electronically Signed   By: Burman Nieves M.D.   On: 06/14/2023 23:15   DG Chest Port 1 View Result Date: 06/14/2023 CLINICAL DATA:  Possible sepsis EXAM: PORTABLE CHEST 1 VIEW COMPARISON:  04/30/2023, 08/03/2022, 09/09/2019 FINDINGS: Bilateral calcified pleural plaques. Emphysema. Chronic appearing mild reticular opacity at the bases. No acute confluent airspace disease or effusion. Stable cardiomediastinal silhouette with aortic atherosclerosis. No pneumothorax IMPRESSION: No active disease. Emphysema and chronic appearing reticular opacity at the bases. Calcified pleural plaques. Electronically Signed   By: Jasmine Pang M.D.   On: 06/14/2023 20:22        Scheduled Meds:  atorvastatin  20 mg Oral Daily   cyanocobalamin  1,000 mcg Oral Daily   multivitamin with minerals  1 tablet Oral Daily   pantoprazole  40 mg Oral Daily   rivaroxaban  10 mg Oral Daily   tamsulosin  0.4  mg Oral Daily   Continuous Infusions:  azithromycin (ZITHROMAX) 500 mg in sodium chloride 0.9 % 250 mL IVPB     cefTRIAXone (ROCEPHIN)  IV       LOS: 0 days    Time spent: 52 minutes spent on 06/15/2023 caring for this patient face-to-face including chart review, ordering labs/tests, documenting, discussion with nursing staff, consultants, updating family and interview/physical exam    Alvira Philips [place], DO [facility] Available via Epic secure chat 7am-7pm After these hours, please refer to coverage provider listed on [email or web address] 06/15/2023, 3:59 PM

## 2023-06-15 NOTE — Progress Notes (Signed)
   06/15/23 2301  BiPAP/CPAP/SIPAP  BiPAP/CPAP/SIPAP Pt Type Adult  BiPAP/CPAP/SIPAP Resmed  BiPAP/CPAP /SiPAP Vitals  SpO2 93 %    Pt stated he doesn't wear CPAP at home.

## 2023-06-15 NOTE — ED Provider Notes (Signed)
 Overall patient appears clinically improved without any oxygen requirement vitals have improved, but lactic acid is escalating in the wrong direction.  He has already received over a liter of fluid.  He is already received IV antibiotics.  Code sepsis has been enacted  Given worsening lactic acid and since he was found to have pneumonia and his multiple comorbidities and his age, I did advise admission as his risk of deterioration is very high  Discussed with Dr. Margo Aye for admission  .Critical Care  Performed by: Zadie Rhine, MD Authorized by: Zadie Rhine, MD   Critical care provider statement:    Critical care time (minutes):  40   Critical care start time:  06/15/2023 12:20 AM   Critical care end time:  06/15/2023 1:00 AM   Critical care time was exclusive of:  Separately billable procedures and treating other patients   Critical care was necessary to treat or prevent imminent or life-threatening deterioration of the following conditions:  Respiratory failure and sepsis   Critical care was time spent personally by me on the following activities:  Examination of patient, evaluation of patient's response to treatment, pulse oximetry, ordering and review of laboratory studies, re-evaluation of patient's condition and development of treatment plan with patient or surrogate   I assumed direction of critical care for this patient from another provider in my specialty: yes     Care discussed with: admitting provider       Zadie Rhine, MD 06/15/23 0104

## 2023-06-15 NOTE — ED Provider Notes (Signed)
 Lactic acid slowly improving.  No acidosis noted.  Patient resting comfortably denies any complaints, no abdominal tenderness he is in no acute distress, vitals appropriate    Zadie Rhine, MD 06/15/23 [phone]

## 2023-06-15 NOTE — Evaluation (Signed)
 Physical Therapy Evaluation Patient Details Name: Steven Guerrero MRN: [id number] DOB: 1940-09-15 Today's Date: 06/15/2023  History of Present Illness  Pt is 83 yo male who presents on 06/15/23 with SOB, wheezing, and productive cough. CT concerning for PNA. Code sepsis called in ED. PMH: CVA, TBI 2003, PE, OSA not on CPAP, CKD3, GERD, COPD/ emphysema, chronic bronchitis, lung nodules, R THA  Clinical Impression  Pt admitted with above diagnosis. Pt from home with wife. He fell 2x in Feb, once EMS was called, once he and wife were able to get him up with use of grab bars. Ambulates with rollator at baseline. Pt with wet cough, spitting sputum into a cup upon PT entry. Pt on room air but desats into mid 80's with all mobility. Pt needed min A to come to EOB and mod A to stand on stool with grab bar. Will follow acutely and recommend HHPT at d/c.  Pt currently with functional limitations due to the deficits listed below (see PT Problem List). Pt will benefit from acute skilled PT to increase their independence and safety with mobility to allow discharge.           If plan is discharge home, recommend the following: A little help with walking and/or transfers;A little help with bathing/dressing/bathroom;Assistance with cooking/housework;Assist for transportation;Help with stairs or ramp for entrance   Can travel by private vehicle        Equipment Recommendations None recommended by PT  Recommendations for Other Services  OT consult    Functional Status Assessment Patient has had a recent decline in their functional status and demonstrates the ability to make significant improvements in function in a reasonable and predictable amount of time.     Precautions / Restrictions Precautions Precautions: Fall Recall of Precautions/Restrictions: Intact Precaution/Restrictions Comments: fell 2x in Feb Restrictions Weight Bearing Restrictions Per Provider Order: No      Mobility  Bed  Mobility Overal bed mobility: Needs Assistance Bed Mobility: Supine to Sit, Sit to Supine     Supine to sit: Min assist Sit to supine: Min assist   General bed mobility comments: min A to elevate trunk to come to sitting. Min A to LE's for return to supine    Transfers Overall transfer level: Needs assistance Equipment used:  (stool with handle) Transfers: Sit to/from Stand Sit to Stand: Mod assist           General transfer comment: mod A to power up. Pt fatigued with each stand and SPO2 dropped to 85% on RA.    Ambulation/Gait               General Gait Details: pt desat with all activity and will need AD for ambulation  Stairs            Wheelchair Mobility     Tilt Bed    Modified Rankin (Stroke Patients Only)       Balance Overall balance assessment: Needs assistance Sitting-balance support: Feet unsupported, Single extremity supported Sitting balance-Leahy Scale: Fair Sitting balance - Comments: posterior lean initially, min A needed. Progressed to midline sitting with supervision   Standing balance support: Bilateral upper extremity supported Standing balance-Leahy Scale: Poor Standing balance comment: heavy reliance on handle on L and therapist support on R                             Pertinent Vitals/Pain Pain Assessment Pain Assessment: Faces Faces Pain Scale:  Hurts little more Pain Location: throat, chest Pain Descriptors / Indicators: Aching, Sore Pain Intervention(s): Limited activity within patient's tolerance    Home Living Family/patient expects to be discharged to:: Private residence Living Arrangements: Spouse/significant other Available Help at Discharge: Family;Available PRN/intermittently Type of Home: House Home Access: Stairs to enter   Entrance Stairs-Number of Steps: 1   Home Layout: One level Home Equipment: Agricultural consultant (2 wheels);Rollator (4 wheels);BSC/3in1;Shower seat;Grab bars - toilet;Grab  bars - tub/shower;Wheelchair - manual Additional Comments: lives with wife    Prior Function Prior Level of Function : Needs assist             Mobility Comments: ambulates with rollator ADLs Comments: Wife occasionally helps with socks     Extremity/Trunk Assessment   Upper Extremity Assessment Upper Extremity Assessment: Generalized weakness    Lower Extremity Assessment Lower Extremity Assessment: Generalized weakness    Cervical / Trunk Assessment Cervical / Trunk Assessment: Kyphotic  Communication   Communication Communication: Impaired Factors Affecting Communication: Reduced clarity of speech    Cognition Arousal: Alert Behavior During Therapy: WFL for tasks assessed/performed   PT - Cognitive impairments: Difficult to assess Difficult to assess due to: Impaired communication                     PT - Cognition Comments: dysarthric and wife answers most questions for him. Follows commands appropriately Following commands: Intact       Cueing Cueing Techniques: Verbal cues, Gestural cues     General Comments General comments (skin integrity, edema, etc.): SPO2 into 80's with all exertion, HR 89 bpm, BP 151/82. Pt with wet cough, coughing up sputum and spitting it in cup    Exercises     Assessment/Plan    PT Assessment Patient needs continued PT services  PT Problem List Decreased strength;Decreased activity tolerance;Decreased balance;Decreased mobility;Decreased coordination;Decreased safety awareness;Cardiopulmonary status limiting activity       PT Treatment Interventions DME instruction;Gait training;Stair training;Functional mobility training;Therapeutic activities;Therapeutic exercise;Balance training;Patient/family education    PT Goals (Current goals can be found in the Care Plan section)  Acute Rehab PT Goals Patient Stated Goal: return home PT Goal Formulation: With patient/family Time For Goal Achievement: 06/29/23 Potential  to Achieve Goals: Fair    Frequency Min 2X/week     Co-evaluation               AM-PAC PT "6 Clicks" Mobility  Outcome Measure Help needed turning from your back to your side while in a flat bed without using bedrails?: A Little Help needed moving from lying on your back to sitting on the side of a flat bed without using bedrails?: A Little Help needed moving to and from a bed to a chair (including a wheelchair)?: A Little Help needed standing up from a chair using your arms (e.g., wheelchair or bedside chair)?: A Lot Help needed to walk in hospital room?: A Lot Help needed climbing 3-5 steps with a railing? : Total 6 Click Score: 14    End of Session Equipment Utilized During Treatment: Gait belt Activity Tolerance: Patient limited by fatigue Patient left: in bed;with call bell/phone within reach;with family/visitor present Nurse Communication: Mobility status;Other (comment) (pt with soiled brief) PT Visit Diagnosis: Unsteadiness on feet (R26.81);Muscle weakness (generalized) (M62.81);History of falling (Z91.81);Difficulty in walking, not elsewhere classified (R26.2)    Time: 0940-1005 PT Time Calculation (min) (ACUTE ONLY): 25 min   Charges:   PT Evaluation $PT Eval Moderate Complexity: 1 Mod PT  Treatments $Therapeutic Activity: 8-22 mins PT General Charges $$ ACUTE PT VISIT: 1 Visit         Lyanne Co, PT  Acute Rehab Services Secure chat preferred Office [phone]   Elyse Hsu 06/15/2023, 12:58 PM

## 2023-06-15 NOTE — ED Provider Notes (Signed)
 Patient's lactate is now 7 despite IV fluids.  His vital signs are appropriate.  He had no complaints.  I consulted critical care who stated for patient continue resuscitation and if any worsening to call them back Hospitalist has been informed Patient had no abdominal pain and CT imaging was overall unremarkable   Zadie Rhine, MD 06/15/23 [phone]

## 2023-06-15 NOTE — H&P (Addendum)
 History and Physical  Steven Guerrero WUJ:[id number] DOB: 05-20-1940 DOA: 06/14/2023  Referring physician: Dr. Bebe Shaggy, EDP  PCP: Merri Brunette, MD  Outpatient Specialists: Pulmonary, oncology, cardiology. Patient coming from: Home.  Chief Complaint: Shortness of breath, cough, wheezing.  HPI: Steven Guerrero is a 83 y.o. male with medical history significant for previous CVA in 2001, TBI in 2003, history of PE on Xarelto, OSA not on CPAP, CKD 3B, GERD, COPD/emphysema, chronic bronchitis, subcentimeter lung nodules followed by pulmonary, presents to the ER from home via EMS with complaints of sudden onset shortness of breath, productive cough, and wheezing today.  Associated with 1 episode of vomiting.  EMS was activated.  Upon EMS assessment, the patient was found to be febrile with a Tmax 103.  Patient took Tylenol prior to arrival to the ER.  En route, he received albuterol nebs, Atrovent nebs, IV Solu-Medrol 125 mg x 1.  Per his wife he has had episodes of chocking on his food.  Eats a soft diet with small bites.  In the ER, respiratory status appeared to be improving.  CT chest abdomen pelvis revealed bibasilar infiltrates with concern for developing pneumonia.  Lab work was notable for lactic acidosis which peaked at 7.2.  Code sepsis was called in the ER.  Peripheral blood cultures were obtained and the patient received IV fluid per sepsis protocol.  Rocephin and IV azithromycin were initiated.  TR[facility], hospitalist service, was asked to admit for further management.  ED Course: Febrile with Tmax: 103 via EMS.  BP 108/62, pulse 83, respiratory rate 24 Lab work notable for lactic acid 2.4, 3.3, 4.1.  WBC 10.1, hemoglobin 12.9, neutrophil count 7.9.  Serum bicarb 20, glucose 147, BUN 24, creatinine 1.56, GFR 44.  Troponin 12, 14.   Review of Systems: Review of systems as noted in the HPI. All other systems reviewed and are negative.   Past Medical History:  Diagnosis Date   Anxiety and depression     Back pain    Brain bleed (HCC)    2003   CVA (cerebral infarction)    2001   Hypercholesteremia    Hypertension    Peripheral neuropathy    Pulmonary embolism (HCC)    Sleep apnea    Stroke (HCC)    2001   TBI (traumatic brain injury) (HCC)    Past Surgical History:  Procedure Laterality Date   HERNIA REPAIR     JOINT REPLACEMENT Right    hip replacement   LEG SURGERY     Broken femur   TOTAL HIP ARTHROPLASTY     Right    Social History:  reports that he quit smoking about 17 years ago. His smoking use included cigarettes. He started smoking about 64 years ago. He has a 70.5 pack-year smoking history. He has never used smokeless tobacco. He reports current alcohol use. He reports that he does not use drugs.   Allergies  Allergen Reactions   Atenolol Anaphylaxis    Per AH   Gabapentin Other (See Comments)    falls   Lotensin [Benazepril Hcl] Swelling    Caused tongue to swell   Wellbutrin [Bupropion] Other (See Comments)    Mental status changes per records from [facility]    Family History  Problem Relation Age of Onset   Heart attack Father        Mother - health hx unknown      Prior to Admission medications   Medication Sig Start Date End Date Taking?  Authorizing Provider  amLODipine (NORVASC) 5 MG tablet Take 5 mg by mouth daily. 07/21/22   [provider]  atorvastatin (LIPITOR) 20 MG tablet Take 20 mg by mouth daily.     [provider]  citalopram (CELEXA) 40 MG tablet Take 40 mg by mouth at bedtime.     [provider]  Evolocumab (REPATHA) 140 MG/ML SOSY Inject 140 mg into the skin every 14 (fourteen) days. Tuesday    [provider]  Ferrous Gluconate-C-Folic Acid (IRON-C PO) Take 1 tablet by mouth every other day.    [provider]  fish oil-omega-3 fatty acids 1000 MG capsule Take 1 g by mouth daily.    [provider]  ipratropium-albuterol (DUONEB) 0.5-2.5 (3) MG/3ML SOLN Take 3 mLs by nebulization  every 6 (six) hours. 06/03/23   Luciano Cutter, MD  losartan (COZAAR) 100 MG tablet Take 100 mg by mouth daily. 07/16/19   [provider]  mirtazapine (REMERON) 15 MG tablet Take 15 mg by mouth daily. 11/30/20   [provider]  Multiple Vitamin (MULTIVITAMIN WITH MINERALS) TABS tablet Take 1 tablet by mouth daily.    [provider]  pantoprazole (PROTONIX) 40 MG tablet Take 40 mg by mouth daily.  02/15/16   [provider]  tamsulosin (FLOMAX) 0.4 MG CAPS capsule Take 1 capsule by mouth daily. 08/01/22   [provider]  vitamin B-12 (CYANOCOBALAMIN) 1000 MCG tablet Take 1,000 mcg by mouth daily.    [provider]  XARELTO 10 MG TABS tablet Take 10 mg by mouth daily.    [provider]    Physical Exam: BP 108/62   Pulse 84   Temp 98 F (36.7 C) (Oral)   Resp (!) 24   SpO2 92%   General: 83 y.o. year-old male well developed well nourished in no acute distress.  Alert and interactive. Cardiovascular: Regular rate and rhythm with no rubs or gallops.  No thyromegaly or JVD noted.  Trace lower extremity edema bilaterally. Respiratory: Mild rales at bases.  Poor inspiratory effort.  Wet cough. Abdomen: Soft nontender nondistended with normal bowel sounds x4 quadrants. Muskuloskeletal: No cyanosis or clubbing noted bilaterally Neuro: CN II-XII intact, strength, sensation, reflexes Skin: No ulcerative lesions noted or rashes Psychiatry: Judgement and insight appear normal. Mood is appropriate for condition and setting          Labs on Admission:  Basic Metabolic Panel: Recent Labs  Lab 06/14/23 2006  NA 139  K 3.6  CL 106  CO2 20*  GLUCOSE 147*  BUN 24*  CREATININE 1.56*  CALCIUM 9.2   Liver Function Tests: Recent Labs  Lab 06/14/23 2006  AST 23  ALT 18  ALKPHOS 54  BILITOT 1.1  PROT 7.1  ALBUMIN 3.7   No results for input(s): "LIPASE", "AMYLASE" in the last 168 hours. No results for input(s): "AMMONIA"  in the last 168 hours. CBC: Recent Labs  Lab 06/14/23 2006  WBC 10.1  NEUTROABS 7.9*  HGB 12.9*  HCT 39.0  MCV 98.5  PLT 163   Cardiac Enzymes: No results for input(s): "CKTOTAL", "CKMB", "CKMBINDEX", "TROPONINI" in the last 168 hours.  BNP (last 3 results) No results for input(s): "BNP" in the last 8760 hours.  ProBNP (last 3 results) No results for input(s): "PROBNP" in the last 8760 hours.  CBG: No results for input(s): "GLUCAP" in the last 168 hours.  Radiological Exams on Admission: CT CHEST ABDOMEN PELVIS W CONTRAST Result Date: 06/14/2023 CLINICAL  DATA:  Shortness of breath.  Sepsis. EXAM: CT CHEST, ABDOMEN, AND PELVIS WITH CONTRAST TECHNIQUE: Multidetector CT imaging of the chest, abdomen and pelvis was performed following the standard protocol during bolus administration of intravenous contrast. RADIATION DOSE REDUCTION: This exam was performed according to the departmental dose-optimization program which includes automated exposure control, adjustment of the mA and/or kV according to patient size and/or use of iterative reconstruction technique. CONTRAST:  75mL OMNIPAQUE IOHEXOL 350 MG/ML SOLN COMPARISON:  Chest radiograph 06/14/2023. CT chest abdomen and pelvis 12/04/2020 FINDINGS: CT CHEST FINDINGS Cardiovascular: Cardiac enlargement. Trace pericardial effusion. Ascending aortic aneurysm measuring 4.4 cm diameter. No significant change since prior study by my measurement. No aortic dissection. Calcification of the aorta and coronary arteries. Great vessel origins are patent. Mediastinum/Nodes: Thyroid gland is unremarkable. Esophagus is decompressed. No significant lymphadenopathy. Lungs/Pleura: Bilateral calcified pleural plaques may indicate prior asbestos exposure. Mild subpleural and apical scarring. Increased opacities in the lung bases may represent atelectasis or active alveolitis in the setting of fibrosis. Emphysematous changes throughout the lungs. Bronchial wall  thickening suggesting chronic bronchitis. No pleural effusion or pneumothorax. Musculoskeletal: No chest wall mass or suspicious bone lesions identified. Old rib fractures. CT ABDOMEN PELVIS FINDINGS Hepatobiliary: Cholelithiasis. No inflammatory changes. No bile duct dilatation. No focal liver lesions. Pancreas: Unremarkable. No pancreatic ductal dilatation or surrounding inflammatory changes. Spleen: Normal in size without focal abnormality. Calcified granulomas. Adrenals/Urinary Tract: Prominent bilateral renal artery and intrarenal arterial calcifications. Nephrograms are symmetrical. Subcentimeter cysts without change. No imaging follow-up is indicated. No hydronephrosis or hydroureter. Bladder wall trabeculation and cellule formation likely representing chronic outlet obstruction. Stomach/Bowel: Stomach, small bowel, and colon are not abnormally distended. No wall thickening or inflammatory changes. Scattered colonic diverticula without evidence of acute diverticulitis. Appendix is not identified. Vascular/Lymphatic: Aortic atherosclerosis. No enlarged abdominal or pelvic lymph nodes. Reproductive: Prostate gland is enlarged. Other: Small left inguinal hernia containing fat. No free air or free fluid in the abdomen. Musculoskeletal: Anterior compression of the L2 vertebra with about 50% loss of height. This is new since prior study and could represent an acute fracture. Degenerative changes in the lumbar spine. No destructive bone lesions. Degenerative changes in the left hip. Prior right hip replacement. IMPRESSION: 1. Evidence of chronic lung disease with emphysema, chronic bronchitic changes, and subpleural fibrosis. 2. Developing infiltrates in the lung bases, possibly pneumonia or atelectasis. 3. Calcified pleural plaques bilaterally. 4. 4.4 cm diameter thoracic ascending aortic aneurysm. Recommend annual imaging followup by CTA or MRA. This recommendation follows 2010  ACCF/AHA/AATS/ACR/ASA/SCA/SCAI/SIR/STS/SVM Guidelines for the Diagnosis and Management of Patients with Thoracic Aortic Disease. Circulation. 2010; 121: N629-B284. Aortic aneurysm NOS (ICD10-I71.9) 5. Cholelithiasis without evidence of acute cholecystitis. 6. Aortic atherosclerosis. 7. Enlarged prostate gland. Cellule formation in the bladder may indicate chronic outlet obstruction. 8. Anterior compression of the L2 vertebra, new since prior study and possibly acute. Electronically Signed   By: Burman Nieves M.D.   On: 06/14/2023 23:15   DG Chest Port 1 View Result Date: 06/14/2023 CLINICAL DATA:  Possible sepsis EXAM: PORTABLE CHEST 1 VIEW COMPARISON:  04/30/2023, 08/03/2022, 09/09/2019 FINDINGS: Bilateral calcified pleural plaques. Emphysema. Chronic appearing mild reticular opacity at the bases. No acute confluent airspace disease or effusion. Stable cardiomediastinal silhouette with aortic atherosclerosis. No pneumothorax IMPRESSION: No active disease. Emphysema and chronic appearing reticular opacity at the bases. Calcified pleural plaques. Electronically Signed   By: Jasmine Pang M.D.   On: 06/14/2023 20:22    EKG: I independently viewed the EKG done  and my findings are as followed: Sinus tachycardia rate of 129.  Nonspecific ST-T changes.  QTC 633.  Assessment/Plan Present on Admission:  CAP (community acquired pneumonia)  Principal Problem:   CAP (community acquired pneumonia)  Sepsis secondary to community-acquired pneumonia, POA Lactic acid 7.2, respiratory rate 24, pneumonia on chest x-ray Obtain baseline procalcitonin and sputum culture Follow peripheral blood cultures x 2 Continue Rocephin and azithromycin initiated in the ER Bronchodilators as needed Antitussives as needed Early mobilization  QTc prolongation Admission twelve-lead EKG with QTc of 633 Optimize magnesium and potassium levels Goal potassium greater than 4.0 Goal magnesium greater than 2.0 Closely monitor  on telemetry and repeat twelve-lead EKG  Hypomagnesemia Serum magnesium 1.6 Repleted intravenously with 2 g IV magnesium  History of PE on Xarelto Resume home Xarelto  History of CVA Not on antiplatelet due to already being on DOAC  CKD 3B Renal function is at baseline Avoid nephrotoxic agents, dehydration, and hypotension Monitor urine output  BPH Resume home tamsulosin Monitor urine output.  Generalized weakness PT OT assessment Fall precautions.  OSA CPAP nightly if agreeable.  GERD Resume home PPI  Hypertension BPs are currently soft in the setting of sepsis Hold off home oral antihypertensives for now Maintain MAP greater than 65 Closely monitor vital signs  Hyperlipidemia Resume home Lipitor  Self-reported dysphagia with solids Per his wife he eats a soft diet at home with small bites Has had issues with choking on his food Speech therapist consulted for swallow evaluation. Aspiration precautions are in place    Critical care time: 65 minutes.    DVT prophylaxis: Home Xarelto.  Code Status: Full code.  Family Communication: Updated the patient's wife at bedside.  Disposition Plan: Admitted to telemetry medical unit  Consults called: None.  Admission status: Inpatient status.   Status is: Inpatient The patient requires at least 2 midnights for further evaluation and treatment of present condition.   Darlin Drop MD [facility] Pager [phone]  If 7PM-7AM, please contact night-coverage [email or web address] Password [email or web address]  06/15/2023, 1:48 AM

## 2023-06-15 NOTE — Sepsis Progress Note (Signed)
 Elink following code sepsis

## 2023-06-15 NOTE — ED Notes (Signed)
 Lactic acid result reported to admitting MD.

## 2023-06-15 NOTE — Evaluation (Signed)
 Clinical/Bedside Swallow Evaluation Patient Details  Name: Steven Guerrero MRN: [id number] Date of Birth: 12/20/1940  Today's Date: 06/15/2023 Time: SLP Start Time (ACUTE ONLY): 1040 SLP Stop Time (ACUTE ONLY): 1058 SLP Time Calculation (min) (ACUTE ONLY): 18 min  Past Medical History:  Past Medical History:  Diagnosis Date   Anxiety and depression    Back pain    Brain bleed (HCC)    2003   CVA (cerebral infarction)    2001   Hypercholesteremia    Hypertension    Peripheral neuropathy    Pulmonary embolism (HCC)    Sleep apnea    Stroke (HCC)    2001   TBI (traumatic brain injury) (HCC)    Past Surgical History:  Past Surgical History:  Procedure Laterality Date   HERNIA REPAIR     JOINT REPLACEMENT Right    hip replacement   LEG SURGERY     Broken femur   TOTAL HIP ARTHROPLASTY     Right   HPI:  Steven Guerrero is a 83 y.o. male with medical history significant for previous CVA in 2001, TBI in 2003, history of PE on Xarelto, OSA not on CPAP, CKD 3B, GERD, COPD/emphysema, chronic bronchitis, subcentimeter lung nodules followed by pulmonary, presents to the ER from home via EMS with complaints of sudden onset shortness of breath, productive cough, and wheezing today.  Associated with 1 episode of vomiting.  EMS was activated.  Upon EMS assessment, the patient was found to be febrile with a Tmax 103.  Patient took Tylenol prior to arrival to the ER.  En route, he received albuterol nebs, Atrovent nebs, IV Solu-Medrol 125 mg x 1.  Per his wife he has had episodes of chocking on his food.  Eats a soft diet with small bites.  In the ER, respiratory status appeared to be improving.  CT chest abdomen pelvis revealed bibasilar infiltrates with concern for developing pneumonia.    Assessment / Plan / Recommendation  Clinical Impression  Overall, patient presents with a functional oropharyngeal swallow based on bedside exam. Oral phase efficient and with timely and full clearance of bolus.  Liquids consumed via straw due to patient request given increased ease during self feeding. He has intermittent congested cough noted at baseline which continued during po intake, immediately after the swallow however. Given h/o dysphagia and acute PNA, recommend repeat instrumental exam to assess for changes that would increase aspiration risk. Will plan for MBS next date. SLP Visit Diagnosis: Dysphagia, unspecified (R13.10)       Diet Recommendation Regular;Thin liquid    Liquid Administration via: Cup;Straw Medication Administration: Whole meds with liquid Supervision: Patient able to self feed Compensations: Small sips/bites;Slow rate Postural Changes: Seated upright at 90 degrees    Other  Recommendations Oral Care Recommendations: Oral care BID    Recommendations for follow up therapy are one component of a multi-disciplinary discharge planning process, led by the attending physician.  Recommendations may be updated based on patient status, additional functional criteria and insurance authorization.    Swallow Study   General Date of Onset: 06/15/23 HPI: Steven Guerrero is a 83 y.o. male with medical history significant for previous CVA in 2001, TBI in 2003, history of PE on Xarelto, OSA not on CPAP, CKD 3B, GERD, COPD/emphysema, chronic bronchitis, subcentimeter lung nodules followed by pulmonary, presents to the ER from home via EMS with complaints of sudden onset shortness of breath, productive cough, and wheezing today.  Associated with 1 episode of  vomiting.  EMS was activated.  Upon EMS assessment, the patient was found to be febrile with a Tmax 103.  Patient took Tylenol prior to arrival to the ER.  En route, he received albuterol nebs, Atrovent nebs, IV Solu-Medrol 125 mg x 1.  Per his wife he has had episodes of chocking on his food.  Eats a soft diet with small bites.  In the ER, respiratory status appeared to be improving.  CT chest abdomen pelvis revealed bibasilar infiltrates with  concern for developing pneumonia. Type of Study: Bedside Swallow Evaluation Previous Swallow Assessment: most recent MBS in 2022 Diet Prior to this Study: Regular;Thin liquids (Level 0) Temperature Spikes Noted: No Respiratory Status: Room air History of Recent Intubation: No Behavior/Cognition: Alert;Cooperative;Pleasant mood Oral Cavity Assessment: Within Functional Limits Oral Care Completed by SLP: No Oral Cavity - Dentition: Missing dentition (has dentures but does not wear due to poor fit) Vision: Functional for self-feeding Self-Feeding Abilities: Able to feed self Patient Positioning: Upright in bed Baseline Vocal Quality: Wet (intermittent likely due to chest congestion, increased mucous) Volitional Cough: Strong;Congested Volitional Swallow: Able to elicit    Oral/Motor/Sensory Function Overall Oral Motor/Sensory Function: Mild impairment Facial ROM: Within Functional Limits Facial Symmetry: Abnormal symmetry left;Suspected CN VII (facial) dysfunction (from previous CVA) Facial Strength: Within Functional Limits Facial Sensation: Within Functional Limits Lingual ROM: Within Functional Limits Lingual Symmetry: Within Functional Limits Lingual Strength: Reduced Lingual Sensation: Within Functional Limits Velum: Within Functional Limits Mandible: Within Functional Limits   Ice Chips Ice chips: Not tested   Thin Liquid Thin Liquid: Within functional limits Presentation: Self Fed;Straw    Nectar Thick Nectar Thick Liquid: Not tested   Honey Thick Honey Thick Liquid: Not tested   Puree Puree: Within functional limits   Solid     Solid: Within functional limits Presentation: Self Fed     [employer] MA, CCC-SLP  Alessia Gonsalez Meryl 06/15/2023,11:31 AM

## 2023-06-15 NOTE — ED Notes (Signed)
 Changed patient placed another brief and a pad patient is resting with family at bedside and call bell in reach

## 2023-06-16 ENCOUNTER — Inpatient Hospital Stay (HOSPITAL_COMMUNITY)

## 2023-06-16 DIAGNOSIS — J189 Pneumonia, unspecified organism: Secondary | ICD-10-CM | POA: Diagnosis not present

## 2023-06-16 LAB — BASIC METABOLIC PANEL
Anion gap: 5 (ref 5–15)
BUN: 19 mg/dL (ref 8–23)
CO2: 22 mmol/L (ref 22–32)
Calcium: 8.1 mg/dL — ABNORMAL LOW (ref 8.9–10.3)
Chloride: 111 mmol/L (ref 98–111)
Creatinine, Ser: 1.28 mg/dL — ABNORMAL HIGH (ref 0.61–1.24)
GFR, Estimated: 56 mL/min — ABNORMAL LOW (ref >60)
Glucose, Bld: 100 mg/dL — ABNORMAL HIGH (ref 70–99)
Potassium: 3.9 mmol/L (ref 3.5–5.1)
Sodium: 138 mmol/L (ref 135–145)

## 2023-06-16 LAB — EXPECTORATED SPUTUM ASSESSMENT W GRAM STAIN, RFLX TO RESP C

## 2023-06-16 LAB — MAGNESIUM: Magnesium: 1.9 mg/dL (ref 1.7–2.4)

## 2023-06-16 MED ORDER — SIMETHICONE 80 MG PO CHEW
80.0000 mg | CHEWABLE_TABLET | Freq: Four times a day (QID) | ORAL | Status: DC | PRN
  Administered 2023-06-16 – 2023-06-26 (×2): 80 mg via ORAL
  Filled 2023-06-16 (×2): qty 1

## 2023-06-16 NOTE — Progress Notes (Signed)
 PROGRESS NOTE    Steven Guerrero  UJW:[id number] DOB: 01-Nov-1940 DOA: 06/14/2023 PCP: Merri Brunette, MD    Brief Narrative:   Steven Guerrero is a 83 y.o. male with past medical history significant for CVA, TBI, history of PE on Xarelto, CKD stage IIIb, COPD/emphysema, chronic bronchitis, subcentimeter lung nodules, OSA not on CPAP, GERD who presented to [facility] ED on 06/14/2023 from home via EMS with complaint of sudden onset shortness of breath, productive cough, wheezing.  Also notable for 1 episode of vomiting.  On EMS arrival, patient was noted to be febrile with a Tmax of 103.0 F.  Patient took Tylenol and received albuterol neb, Atrovent neb, IV Solu-Medrol 125 mg x 1 by EMS prior to ED arrival.  In the ED, temperature 98.9 F (103 with EMS), HR 131, RR 34, BP 145/61, SpO2 97% on 4 L aerosol mask.  WBC 10.1, hemoglobin 12.9, platelet count 163.  Sodium 139, potassium 3.6, chloride 106, CO2 20, glucose 147, BUN 24, creatinine 1.56.  AST 23, ALT 18, total bilirubin 1.1.  BNP 150.5.  High-sensitivity troponin 14.  Lactic acid 2.4>3.3>>7.2>6.2>5.0.  Procalcitonin 4.42.  Urinalysis negative.  COVID/influenza/RSV PCR negative.  CT chest/abdomen/pelvis with evidence of chronic lung disease with emphysema, chronic bronchitic changes, subpleural fibrosis, developing infiltrates lung bases consistent with pneumonia, 4.4 cm diameter thoracic ascending aortic aneurysm, cholelithiasis without acute cholecystitis, aortic atherosclerosis, enlarged prostate gland, L2 compression fracture.  Patient was started on azithromycin and ceftriaxone.  Received IV fluid bolus.  TRH consulted for admission for further evaluation management of sepsis secondary to community-acquired pneumonia.  Assessment & Plan:   Severe sepsis, POA Community-acquired pneumonia Lactic acidosis Acute respiratory failure with hypoxia Patient presenting with sudden onset shortness of breath, productive cough associated with  wheezing.  On EMS arrival, Tmax was noted to be 103.0 F.  Patient with tachycardia, tachypnea requiring 4 L aerosol mask on arrival in which she is not oxygen dependent at baseline.  Elevated WBC with lactic acidosis.  Procalcitonin 4.42. -- Azithromycin 500 mg IV every 24 hours x 5 days -- Ceftriaxone 2 g IV every 24 hours -- CBC daily, monitor fever curve,  He remains on 5 L oxygen this morning -Was encouraged use incentive spirometry and flutter valve  Hypomagnesemia Repleted. -- Monitor electrolytes closely daily  Dysphagia Per spouse, has issues with choking on his food. -- SLP evaluation, MBS done today, on dysphagia 3 with thin liquid -- Aspiration precautions  Essential hypertension At baseline on amlodipine 5 mg p.o. daily, losartan 100 mg p.o. daily. -- Restart amlodipine 5 mg p.o. daily --Hold home losartan for now -- Hydralazine 25 g p.o. every 6 hours as needed SBP >165  History of PE -- Xarelto 10 mg p.o. daily  CKD stage IIIb -- Cr 1.49, stable and at baseline -- Repeat BMP in a.m.  COPD/emphysema Not oxygen dependent at baseline, not on any controlling medications. -- duonebs q6h PRN SOB/wheezing  BPH -- Tamsulosin 0.4 mg p.o. daily  Depression/anxiety -- Celexa 20 mg p.o. nightly  Hyperlipidemia -- Atorvastatin 20 m p.o. daily  OSA -- Nocturnal CPAP  L2 compression fracture noted on imaging this admission -Wife reports patient had fall in February, but he currently denies any back pain, will start on calcium/vitamin D    DVT prophylaxis: rivaroxaban (XARELTO) tablet 10 mg Start: 06/15/23 1000 rivaroxaban (XARELTO) tablet 10 mg    Code Status: Limited: Do not attempt resuscitation (DNR) -DNR-LIMITED -Do Not Intubate/DNI  Family Communication:  Discussed with wife at bedside, she confirmed his DNR/DNI, but otherwise continue with full medical management.  Disposition Plan:  Level of care: Progressive Status is: Inpatient Remains inpatient  appropriate because: IV antibiotics    Consultants:  None  Procedures:  None  Antimicrobials:  Azithromycin Ceftriaxone   Subjective: Patient denies any complaints this morning, he remains on 4 L oxygen, he did report some abdominal discomfort and distention overnight, this resolved with simethicone Objective: Vitals:   06/16/23 0100 06/16/23 0400 06/16/23 0946 06/16/23 1209  BP:  (!) 147/82 131/75 134/71  Pulse: 71 72 75 79  Resp: 20 20 (!) 22 (!) 25  Temp:  98 F (36.7 C)  98 F (36.7 C)  TempSrc:  Oral  Oral  SpO2: 93% 91% (!) 88% 90%    Intake/Output Summary (Last 24 hours) at 06/16/2023 1517 Last data filed at 06/16/2023 1209 Gross per 24 hour  Intake 850 ml  Output 1500 ml  Net -650 ml   There were no vitals filed for this visit.  Examination:  Physical Exam: Awake Alert, Oriented X 3, No new F.N deficits, Normal affect, frail, deconditioned Symmetrical Chest wall movement, scattered Rales, mainly at the bases, but no wheezing RRR,No Gallops,Rubs or new Murmurs, No Parasternal Heave +ve B.Sounds, Abd Soft, No tenderness, No rebound - guarding or rigidity. No Cyanosis, Clubbing or edema, No new Rash or bruise       Data Reviewed: I have personally reviewed following labs and imaging studies  CBC: Recent Labs  Lab 06/14/23 2006 06/15/23 0321 06/15/23 0352  WBC 10.1 11.9*  --   NEUTROABS 7.9* 11.3*  --   HGB 12.9* 10.6* 10.9*  HCT 39.0 32.3* 32.0*  MCV 98.5 99.7  --   PLT 163 153  --    Basic Metabolic Panel: Recent Labs  Lab 06/14/23 2006 06/15/23 0321 06/15/23 0352 06/16/23 0444  NA 139 138 138 138  K 3.6 3.6 3.6 3.9  CL 106 109  --  111  CO2 20* 17*  --  22  GLUCOSE 147* 192*  --  100*  BUN 24* 22  --  19  CREATININE 1.56* 1.49*  --  1.28*  CALCIUM 9.2 8.1*  --  8.1*  MG  --  1.6*  --  1.9  PHOS  --  3.0  --   --    GFR: Estimated Creatinine Clearance: 46.6 mL/min (A) (by C-G formula based on SCr of 1.28 mg/dL (H)). Liver  Function Tests: Recent Labs  Lab 06/14/23 2006 06/15/23 0321  AST 23 27  ALT 18 15  ALKPHOS 54 43  BILITOT 1.1 0.6  PROT 7.1 5.6*  ALBUMIN 3.7 2.8*   No results for input(s): "LIPASE", "AMYLASE" in the last 168 hours. No results for input(s): "AMMONIA" in the last 168 hours. Coagulation Profile: Recent Labs  Lab 06/14/23 2006  INR 1.2   Cardiac Enzymes: No results for input(s): "CKTOTAL", "CKMB", "CKMBINDEX", "TROPONINI" in the last 168 hours. BNP (last 3 results) No results for input(s): "PROBNP" in the last 8760 hours. HbA1C: No results for input(s): "HGBA1C" in the last 72 hours. CBG: Recent Labs  Lab 06/15/23 1654  GLUCAP 87   Lipid Profile: No results for input(s): "CHOL", "HDL", "LDLCALC", "TRIG", "CHOLHDL", "LDLDIRECT" in the last 72 hours. Thyroid Function Tests: No results for input(s): "TSH", "T4TOTAL", "FREET4", "T3FREE", "THYROIDAB" in the last 72 hours. Anemia Panel: No results for input(s): "VITAMINB12", "FOLATE", "FERRITIN", "TIBC", "IRON", "RETICCTPCT" in the last 72 hours. Sepsis  Labs: Recent Labs  Lab 06/15/23 0031 06/15/23 0251 06/15/23 0321 06/15/23 0546  PROCALCITON  --   --  4.42  --   LATICACIDVEN 4.1* 7.2* 6.2* 5.0*    Recent Results (from the past 240 hours)  Blood Culture (routine x 2)     Status: None (Preliminary result)   Collection Time: 06/14/23  8:06 PM   Specimen: BLOOD  Result Value Ref Range Status   Specimen Description BLOOD RIGHT ARM  Final   Special Requests   Final    BOTTLES DRAWN AEROBIC AND ANAEROBIC Blood Culture adequate volume   Culture   Final    NO GROWTH 2 DAYS Performed at [facility], [address], [place], [place] [address]    Report Status PENDING  Incomplete  Resp panel by RT-PCR (RSV, Flu A&B, Covid) Anterior Nasal Swab     Status: None   Collection Time: 06/14/23  8:27 PM   Specimen: Anterior Nasal Swab  Result Value Ref Range Status   SARS Coronavirus 2 by RT PCR NEGATIVE NEGATIVE Final    Influenza A by PCR NEGATIVE NEGATIVE Final   Influenza B by PCR NEGATIVE NEGATIVE Final    Comment: (NOTE) The Xpert Xpress SARS-CoV-2/FLU/RSV plus assay is intended as an aid in the diagnosis of influenza from Nasopharyngeal swab specimens and should not be used as a sole basis for treatment. Nasal washings and aspirates are unacceptable for Xpert Xpress SARS-CoV-2/FLU/RSV testing.  Fact Sheet for Patients: BloggerCourse.com  Fact Sheet for Healthcare Providers: SeriousBroker.it  This test is not yet approved or cleared by the [place] FDA and has been authorized for detection and/or diagnosis of SARS-CoV-2 by FDA under an Emergency Use Authorization (EUA). This EUA will remain in effect (meaning this test can be used) for the duration of the COVID-19 declaration under Section 564(b)(1) of the Act, 21 U.S.C. section 360bbb-3(b)(1), unless the authorization is terminated or revoked.     Resp Syncytial Virus by PCR NEGATIVE NEGATIVE Final    Comment: (NOTE) Fact Sheet for Patients: BloggerCourse.com  Fact Sheet for Healthcare Providers: SeriousBroker.it  This test is not yet approved or cleared by the [place] FDA and has been authorized for detection and/or diagnosis of SARS-CoV-2 by FDA under an Emergency Use Authorization (EUA). This EUA will remain in effect (meaning this test can be used) for the duration of the COVID-19 declaration under Section 564(b)(1) of the Act, 21 U.S.C. section 360bbb-3(b)(1), unless the authorization is terminated or revoked.  Performed at [facility], [address], [place], [place] [address]   Blood Culture (routine x 2)     Status: None (Preliminary result)   Collection Time: 06/14/23  8:30 PM   Specimen: BLOOD  Result Value Ref Range Status   Specimen Description BLOOD LEFT ARM  Final   Special Requests   Final     BOTTLES DRAWN AEROBIC AND ANAEROBIC Blood Culture adequate volume   Culture   Final    NO GROWTH 2 DAYS Performed at [facility], [address] [place], [place] [address]    Report Status PENDING  Incomplete  Expectorated Sputum Assessment w Gram Stain, Rflx to Resp Cult     Status: None   Collection Time: 06/16/23  4:48 AM   Specimen: Expectorated Sputum  Result Value Ref Range Status   Specimen Description EXPECTORATED SPUTUM  Final   Special Requests NONE  Final   Sputum evaluation   Final    THIS  SPECIMEN IS ACCEPTABLE FOR SPUTUM CULTURE Performed at [facility], [address], [place], [place] [address]    Report Status 06/16/2023 FINAL  Final  Culture, Respiratory w Gram Stain     Status: None (Preliminary result)   Collection Time: 06/16/23  4:48 AM  Result Value Ref Range Status   Specimen Description EXPECTORATED SPUTUM  Final   Special Requests NONE Reflexed from [id number]  Final   Gram Stain   Final    FEW WBC PRESENT, PREDOMINANTLY PMN FEW GRAM NEGATIVE RODS FEW GRAM POSITIVE COCCI Performed at [facility], [address] [place], [place] [address]    Culture PENDING  Incomplete   Report Status PENDING  Incomplete         Radiology Studies: DG Swallowing Func-Speech Pathology Result Date: 06/16/2023 Table formatting from the original result was not included. Modified Barium Swallow Study Patient Details Name: Steven Guerrero MRN: [id number] Date of Birth: [birth year] Today's Date: 06/16/2023 HPI/PMH: HPI: Steven Guerrero is a 83 y.o. male with medical history significant for previous CVA in 2001, TBI in 2003, history of PE on Xarelto, OSA not on CPAP, CKD 3B, GERD, COPD/emphysema, chronic bronchitis, subcentimeter lung nodules followed by pulmonary, presents to the ER from home via EMS with complaints of sudden onset shortness of breath, productive cough, and wheezing today.  Associated with 1 episode of vomiting.  EMS was activated.  Upon EMS assessment,  the patient was found to be febrile with a Tmax 103.  Patient took Tylenol prior to arrival to the ER.  En route, he received albuterol nebs, Atrovent nebs, IV Solu-Medrol 125 mg x 1.  Per his wife he has had episodes of chocking on his food.  Eats a soft diet with small bites.  In the ER, respiratory status appeared to be improving.  CT chest abdomen pelvis revealed bibasilar infiltrates with concern for developing pneumonia. Clinical Impression: Pt presents with a moderate oropharyngeald dysphagia c/b delayed swallow intiation, reduced base of tongue retraction, incomplete larynagel closure, decreased hyolaryngeal elevation and excursion, and diminished sensation.  Also of note pt has a previously idendified Zenker's diverticulum with backflow noted to pyriform sinuses without penetration/aspiration.  Collection of residue remained in diverticulum on esophageal sweep.  These deficits resulted in trace, silent penetration of thin liquid with serial cup sips to the level of the vocal folds.  It was very difficult to clear penetration.  Cued cough was not effective. Following liquid with heavier bolus trials did not clear penetration, but aspiration was not observed.  Pt tolerated serial straws sips, however with good laryngeal closure and no penetration was noted.  During pill simulation there was was prolonged stasis of tablet in vallecula.  Multiple puree boluses and thin liquid was were trialed to clear.  There was aspiration of liquid wash during these trials which was not sensed.  Medications should be given crushed with puree if permissible.  Recommend continuing mechanical soft diet with thin liquids by single cup sips. Consider intevention for Zenker's diverticulum if indicated. DIGEST Swallow Severity Rating*  Safety: 1  Efficiency: 2  Overall Pharyngeal Swallow Severity: 2 1: mild; 2: moderate; 3: severe; 4: profound *The Dynamic Imaging Grade of Swallowing Toxicity is standardized for the head and neck  cancer population, however, demonstrates promising clinical applications across populations to standardize the clinical rating of pharyngeal swallow safety and severity. Factors that may increase risk of adverse event in presence of aspiration Rubye Oaks & Clearance Coots 2021): No data  recorded Recommendations/Plan: Swallowing Evaluation Recommendations Swallowing Evaluation Recommendations Recommendations: PO diet PO Diet Recommendation: Dysphagia 3 (Mechanical soft); Thin liquids (Level 0) Liquid Administration via: Cup Medication Administration: Crushed with puree Supervision: Intermittent supervision/cueing for swallowing strategies Swallowing strategies  : Slow rate; Small bites/sips Postural changes: Position pt fully upright for meals; Stay upright 30-60 min after meals Oral care recommendations: Oral care BID (2x/day) Recommended consults: Consider ENT consultation (for Zenker's if indicated) Treatment Plan Treatment Plan Treatment recommendations: Therapy as outlined in treatment plan below Follow-up recommendations: -- (ST at next level of care) Functional status assessment: Patient has had a recent decline in their functional status and demonstrates the ability to make significant improvements in function in a reasonable and predictable amount of time. Treatment frequency: Min 2x/week Treatment duration: 2 weeks Interventions: Aspiration precaution training; Diet toleration management by SLP Recommendations Recommendations for follow up therapy are one component of a multi-disciplinary discharge planning process, led by the attending physician.  Recommendations may be updated based on patient status, additional functional criteria and insurance authorization. Assessment: Orofacial Exam: Orofacial Exam Oral Cavity - Dentition: Missing dentition Orofacial Anatomy: WFL Oral Motor/Sensory Function: -- (See BSE) Anatomy: Anatomy: -- (Previously identified Zenker's diverticulum) Boluses Administered: Boluses Administered  Boluses Administered: Thin liquids (Level 0); Mildly thick liquids (Level 2, nectar thick); Moderately thick liquids (Level 3, honey thick); Puree; Solid  Oral Impairment Domain: Oral Impairment Domain Lip Closure: Interlabial escape, no progression to anterior lip Tongue control during bolus hold: Posterior escape of less than half of bolus Bolus preparation/mastication: Timely and efficient chewing and mashing Bolus transport/lingual motion: Brisk tongue motion Oral residue: Trace residue lining oral structures Location of oral residue : Tongue Initiation of pharyngeal swallow : Valleculae  Pharyngeal Impairment Domain: Pharyngeal Impairment Domain Soft palate elevation: No bolus between soft palate (SP)/pharyngeal wall (PW) Laryngeal elevation: Partial superior movement of thyroid cartilage/partial approximation of arytenoids to epiglottic petiole Anterior hyoid excursion: Partial anterior movement Epiglottic movement: Complete inversion Laryngeal vestibule closure: Incomplete, narrow column air/contrast in laryngeal vestibule Pharyngeal stripping wave : Present - diminished Pharyngeal contraction (A/P view only): N/A Pharyngoesophageal segment opening: Complete distension and complete duration, no obstruction of flow Tongue base retraction: Narrow column of contrast or air between tongue base and PPW Pharyngeal residue: Collection of residue within or on pharyngeal structures Location of pharyngeal residue: Valleculae; Pyriform sinuses  Esophageal Impairment Domain: Esophageal Impairment Domain Esophageal clearance upright position: Esophageal retention with retrograde flow through the PES (2/2 diverticulum) Pill: Pill Consistency administered: Thin liquids (Level 0); Puree Thin liquids (Level 0): Impaired (see clinical impressions) Puree: Impaired (see clinical impressions) Penetration/Aspiration Scale Score: Penetration/Aspiration Scale Score 1.  Material does not enter airway: Mildly thick liquids (Level 2,  nectar thick); Moderately thick liquids (Level 3, honey thick); Puree; Solid; Pill 5.  Material enters airway, CONTACTS cords and not ejected out: Thin liquids (Level 0) 8.  Material enters airway, passes BELOW cords without attempt by patient to eject out (silent aspiration) : Thin liquids (Level 0) (During pill simulation only) Compensatory Strategies: Compensatory Strategies Compensatory strategies: Yes Supraglottic swallow: Ineffective Ineffective Supraglottic Swallow: Thin liquid (Level 0) Other(comment): Ineffective (cough) Ineffective Other(comment): Thin liquid (Level 0)   General Information: No data recorded Diet Prior to this Study: Dysphagia 3 (mechanical soft); Thin liquids (Level 0)   Temperature : Normal   Respiratory Status: Desaturations (with repositioning from bed to chair and back)   Supplemental O2: Nasal cannula   History of Recent Intubation: No  Behavior/Cognition: Alert; Cooperative;  Pleasant mood No data recorded Baseline vocal quality/speech: Normal No data recorded Volitional Swallow: Able to elicit Exam Limitations: No limitations Goal Planning: Prognosis for improved oropharyngeal function: Good No data recorded No data recorded Patient/Family Stated Goal: not stated Consulted and agree with results and recommendations: Patient Pain: Pain Assessment Pain Assessment: Faces Faces Pain Scale: 4 Pain Location: throat, chest Pain Descriptors / Indicators: Aching; Sore Pain Intervention(s): Limited activity within patient's tolerance; Monitored during session; Repositioned End of Session: Start Time:SLP Start Time (ACUTE ONLY): 0913 Stop Time: SLP Stop Time (ACUTE ONLY): 0925 Time Calculation:SLP Time Calculation (min) (ACUTE ONLY): 12 min Charges: SLP Evaluations $ SLP Speech Visit: 1 Visit SLP Evaluations $BSS Swallow: 1 Procedure $MBS Swallow: 1 Procedure SLP visit diagnosis: SLP Visit Diagnosis: Dysphagia, oropharyngeal phase (R13.12) Past Medical History: Past Medical History: Diagnosis  Date  Anxiety and depression   Back pain   Brain bleed (HCC)   2003  CVA (cerebral infarction)   2001  Hypercholesteremia   Hypertension   Peripheral neuropathy   Pulmonary embolism (HCC)   Sleep apnea   Stroke (HCC)   2001  TBI (traumatic brain injury) (HCC)  Past Surgical History: Past Surgical History: Procedure Laterality Date  HERNIA REPAIR    JOINT REPLACEMENT Right   hip replacement  LEG SURGERY    Broken femur  TOTAL HIP ARTHROPLASTY    Right Kerrie Pleasure, MA, CCC-SLP Acute Rehabilitation Services Office: [phone] 06/16/2023, 10:25 AM  DG CHEST PORT 1 VIEW Result Date: 06/15/2023 CLINICAL DATA:  Abdominal pain. History of stroke, hypertension, pulmonary embolism. EXAM: PORTABLE CHEST 1 VIEW, ABDOMEN ONE VIEW COMPARISON:  08/14/2023 FINDINGS: Chest: The heart is mildly enlarged and mediastinal contours are stable. There is atherosclerotic calcification of the aorta. Calcified pleural plaques are noted bilaterally. Stable opacities are noted in the lungs bilaterally, likely associated with pleural plaques. There is atelectasis at the lung bases. No effusion or pneumothorax is seen. No acute osseous abnormality. Abdomen: There is a nonobstructive bowel-gas pattern. Vascular calcifications are noted in the abdomen and pelvis. Total hip arthroplasty changes are noted on the right. There are degenerative changes in the lumbar spine and left hip. IMPRESSION: 1. Stable chest with no active disease. 2. Nonobstructive bowel-gas pattern. Electronically Signed   By: Thornell Sartorius M.D.   On: 06/15/2023 19:07   DG Abd 1 View Result Date: 06/15/2023 CLINICAL DATA:  Abdominal pain. History of stroke, hypertension, pulmonary embolism. EXAM: PORTABLE CHEST 1 VIEW, ABDOMEN ONE VIEW COMPARISON:  08/14/2023 FINDINGS: Chest: The heart is mildly enlarged and mediastinal contours are stable. There is atherosclerotic calcification of the aorta. Calcified pleural plaques are noted bilaterally. Stable opacities are noted in  the lungs bilaterally, likely associated with pleural plaques. There is atelectasis at the lung bases. No effusion or pneumothorax is seen. No acute osseous abnormality. Abdomen: There is a nonobstructive bowel-gas pattern. Vascular calcifications are noted in the abdomen and pelvis. Total hip arthroplasty changes are noted on the right. There are degenerative changes in the lumbar spine and left hip. IMPRESSION: 1. Stable chest with no active disease. 2. Nonobstructive bowel-gas pattern. Electronically Signed   By: Thornell Sartorius M.D.   On: 06/15/2023 19:07   CT CHEST ABDOMEN PELVIS W CONTRAST Result Date: 06/14/2023 CLINICAL DATA:  Shortness of breath.  Sepsis. EXAM: CT CHEST, ABDOMEN, AND PELVIS WITH CONTRAST TECHNIQUE: Multidetector CT imaging of the chest, abdomen and pelvis was performed following the standard protocol during bolus administration of intravenous contrast. RADIATION DOSE REDUCTION: This  exam was performed according to the departmental dose-optimization program which includes automated exposure control, adjustment of the mA and/or kV according to patient size and/or use of iterative reconstruction technique. CONTRAST:  75mL OMNIPAQUE IOHEXOL 350 MG/ML SOLN COMPARISON:  Chest radiograph 06/14/2023. CT chest abdomen and pelvis 12/04/2020 FINDINGS: CT CHEST FINDINGS Cardiovascular: Cardiac enlargement. Trace pericardial effusion. Ascending aortic aneurysm measuring 4.4 cm diameter. No significant change since prior study by my measurement. No aortic dissection. Calcification of the aorta and coronary arteries. Great vessel origins are patent. Mediastinum/Nodes: Thyroid gland is unremarkable. Esophagus is decompressed. No significant lymphadenopathy. Lungs/Pleura: Bilateral calcified pleural plaques may indicate prior asbestos exposure. Mild subpleural and apical scarring. Increased opacities in the lung bases may represent atelectasis or active alveolitis in the setting of fibrosis. Emphysematous  changes throughout the lungs. Bronchial wall thickening suggesting chronic bronchitis. No pleural effusion or pneumothorax. Musculoskeletal: No chest wall mass or suspicious bone lesions identified. Old rib fractures. CT ABDOMEN PELVIS FINDINGS Hepatobiliary: Cholelithiasis. No inflammatory changes. No bile duct dilatation. No focal liver lesions. Pancreas: Unremarkable. No pancreatic ductal dilatation or surrounding inflammatory changes. Spleen: Normal in size without focal abnormality. Calcified granulomas. Adrenals/Urinary Tract: Prominent bilateral renal artery and intrarenal arterial calcifications. Nephrograms are symmetrical. Subcentimeter cysts without change. No imaging follow-up is indicated. No hydronephrosis or hydroureter. Bladder wall trabeculation and cellule formation likely representing chronic outlet obstruction. Stomach/Bowel: Stomach, small bowel, and colon are not abnormally distended. No wall thickening or inflammatory changes. Scattered colonic diverticula without evidence of acute diverticulitis. Appendix is not identified. Vascular/Lymphatic: Aortic atherosclerosis. No enlarged abdominal or pelvic lymph nodes. Reproductive: Prostate gland is enlarged. Other: Small left inguinal hernia containing fat. No free air or free fluid in the abdomen. Musculoskeletal: Anterior compression of the L2 vertebra with about 50% loss of height. This is new since prior study and could represent an acute fracture. Degenerative changes in the lumbar spine. No destructive bone lesions. Degenerative changes in the left hip. Prior right hip replacement. IMPRESSION: 1. Evidence of chronic lung disease with emphysema, chronic bronchitic changes, and subpleural fibrosis. 2. Developing infiltrates in the lung bases, possibly pneumonia or atelectasis. 3. Calcified pleural plaques bilaterally. 4. 4.4 cm diameter thoracic ascending aortic aneurysm. Recommend annual imaging followup by CTA or MRA. This recommendation  follows 2010 ACCF/AHA/AATS/ACR/ASA/SCA/SCAI/SIR/STS/SVM Guidelines for the Diagnosis and Management of Patients with Thoracic Aortic Disease. Circulation. 2010; 121: Z610-R604. Aortic aneurysm NOS (ICD10-I71.9) 5. Cholelithiasis without evidence of acute cholecystitis. 6. Aortic atherosclerosis. 7. Enlarged prostate gland. Cellule formation in the bladder may indicate chronic outlet obstruction. 8. Anterior compression of the L2 vertebra, new since prior study and possibly acute. Electronically Signed   By: Burman Nieves M.D.   On: 06/14/2023 23:15   DG Chest Port 1 View Result Date: 06/14/2023 CLINICAL DATA:  Possible sepsis EXAM: PORTABLE CHEST 1 VIEW COMPARISON:  04/30/2023, 08/03/2022, 09/09/2019 FINDINGS: Bilateral calcified pleural plaques. Emphysema. Chronic appearing mild reticular opacity at the bases. No acute confluent airspace disease or effusion. Stable cardiomediastinal silhouette with aortic atherosclerosis. No pneumothorax IMPRESSION: No active disease. Emphysema and chronic appearing reticular opacity at the bases. Calcified pleural plaques. Electronically Signed   By: Jasmine Pang M.D.   On: 06/14/2023 20:22        Scheduled Meds:  amLODipine  5 mg Oral Daily   atorvastatin  20 mg Oral Daily   citalopram  20 mg Oral QHS   cyanocobalamin  1,000 mcg Oral Daily   multivitamin with minerals  1 tablet Oral Daily  pantoprazole  40 mg Oral Daily   rivaroxaban  10 mg Oral Daily   tamsulosin  0.4 mg Oral Daily   Continuous Infusions:  azithromycin (ZITHROMAX) 500 mg in sodium chloride 0.9 % 250 mL IVPB 500 mg (06/15/23 2320)   cefTRIAXone (ROCEPHIN)  IV 2 g (06/15/23 2225)     LOS: 1 day    Huey Bienenstock,  MD [facility] Available via Epic secure chat 7am-7pm After these hours, please refer to coverage provider listed on [email or web address] 06/16/2023, 3:17 PM

## 2023-06-16 NOTE — Progress Notes (Signed)
 [facility] night cross cover note:   I was notified by RN of the patient's request for medication for his abdominal bloating/gas.  Of note, plain film of the abdomen nonobstructive bowel gas.  I subsequently added prn simethicone.     Newton Pigg, DO Hospitalist

## 2023-06-16 NOTE — Progress Notes (Signed)
   06/16/23 1203  Mobility  Activity Ambulated with assistance in hallway  Level of Assistance Contact guard assist, steadying assist  Assistive Device Front wheel walker  Distance Ambulated (ft) 30 ft  Activity Response Tolerated fair  Mobility Referral Yes  Mobility visit 1 Mobility  Mobility Specialist Start Time (ACUTE ONLY) 1146  Mobility Specialist Stop Time (ACUTE ONLY) 1203  Mobility Specialist Time Calculation (min) (ACUTE ONLY) 17 min   Mobility Specialist: Progress Note  Pre-Mobility:      HR 77, SpO2 91% 5L  Post-Mobility:    HR 74, SpO2 91% 5L   Pt agreeable to mobility session - received in bed. Pt with no complaints, making jokes throughout. Returned to bed with all needs met - call bell within reach. Family present.   Barnie Mort, BS Mobility Specialist Please contact via SecureChat or  Rehab office at [phone].

## 2023-06-16 NOTE — Progress Notes (Signed)
   06/16/23 0100  Provider Notification  Provider Name/Title Dr.Howerter  Date Provider Notified 06/16/23  Time Provider Notified 0107  Method of Notification Page  Notification Reason Requested by patient/family: medication for abdominal distertion and bloating. Chest and abdominal x-ray on 06/15/23 at 15:03 shows stable chest with no active disease and nonobstructive bowel-gas pattern. MD made aware.   Provider response Evaluate remotely;See new orders for simethicone PRN 80 mg PO.  Date of Provider Response 06/16/23  Time of Provider Response 0108   SPO2 88% on 2 LPM. We increase O2 NCL to 5 LPM. Pt is able to maintain SPO2 > 92-93%, RR 20-24. Coarse crackles bilaterally on auscultations. Using incentive spirometer effectively is encouraged. Hemodynamically stable, afebrile. We will continue to monitor.  Filiberto Pinks, RN

## 2023-06-16 NOTE — Procedures (Signed)
 Modified Barium Swallow Study  Patient Details  Name: Steven Guerrero MRN: [id number] Date of Birth: Jun 08, 1940  Today's Date: 06/16/2023  Modified Barium Swallow completed.  Full report located under Chart Review in the Imaging Section.  History of Present Illness Steven Guerrero is a 83 y.o. male with medical history significant for previous CVA in 2001, TBI in 2003, history of PE on Xarelto, OSA not on CPAP, CKD 3B, GERD, COPD/emphysema, chronic bronchitis, subcentimeter lung nodules followed by pulmonary, presents to the ER from home via EMS with complaints of sudden onset shortness of breath, productive cough, and wheezing today.  Associated with 1 episode of vomiting.  EMS was activated.  Upon EMS assessment, the patient was found to be febrile with a Tmax 103.  Patient took Tylenol prior to arrival to the ER.  En route, he received albuterol nebs, Atrovent nebs, IV Solu-Medrol 125 mg x 1.  Per his wife he has had episodes of chocking on his food.  Eats a soft diet with small bites.  In the ER, respiratory status appeared to be improving.  CT chest abdomen pelvis revealed bibasilar infiltrates with concern for developing pneumonia.   Clinical Impression Pt presents with a moderate oropharyngeald dysphagia c/b delayed swallow intiation, reduced base of tongue retraction, incomplete larynagel closure, decreased hyolaryngeal elevation and excursion, and diminished sensation.  Also of note pt has a previously idendified Zenker's diverticulum with backflow noted to pyriform sinuses without penetration/aspiration.  Collection of residue remained in diverticulum on esophageal sweep.  These deficits resulted in trace, silent penetration of thin liquid with serial cup sips to the level of the vocal folds.  It was very difficult to clear penetration.  Cued cough was not effective. Following liquid with heavier bolus trials did not clear penetration, but aspiration was not observed.  Pt tolerated serial straws  sips, however with good laryngeal closure and no penetration was noted.  During pill simulation there was was prolonged stasis of tablet in vallecula.  Multiple puree boluses and thin liquid was were trialed to clear.  There was aspiration of liquid wash during these trials which was not sensed.  Medications should be given crushed with puree if permissible.  Recommend continuing mechanical soft diet with thin liquids by single cup sips. Consider intevention for Zenker's diverticulum if indicated.  DIGEST Swallow Severity Rating*  Safety: 1  Efficiency: 2  Overall Pharyngeal Swallow Severity: 2 1: mild; 2: moderate; 3: severe; 4: profound  *The Dynamic Imaging Grade of Swallowing Toxicity is standardized for the head and neck cancer population, however, demonstrates promising clinical applications across populations to standardize the clinical rating of pharyngeal swallow safety and severity.   Factors that may increase risk of adverse event in presence of aspiration Rubye Oaks & Clearance Coots 2021):    Swallow Evaluation Recommendations Recommendations: PO diet PO Diet Recommendation: Dysphagia 3 (Mechanical soft);Thin liquids (Level 0) Liquid Administration via: Cup Medication Administration: Crushed with puree Supervision: Intermittent supervision/cueing for swallowing strategies Swallowing strategies  : Slow rate;Small bites/sips Postural changes: Position pt fully upright for meals;Stay upright 30-60 min after meals Oral care recommendations: Oral care BID (2x/day) Recommended consults: Consider ENT consultation (for Zenker's if indicated)      Kerrie Pleasure, MA, CCC-SLP Acute Rehabilitation Services Office: [phone] 06/16/2023,10:23 AM

## 2023-06-16 NOTE — Progress Notes (Signed)
   06/16/23 2044  BiPAP/CPAP/SIPAP  BiPAP/CPAP/SIPAP Pt Type Adult  BiPAP/CPAP/SIPAP Resmed  BiPAP/CPAP /SiPAP Vitals  Pulse Rate 93  Resp 19  SpO2 90 %  MEWS Score/Color  MEWS Score 0  MEWS Score Color Green   Pt states he doesn't wear CPAP and doesn't want to wear CPAP

## 2023-06-17 ENCOUNTER — Inpatient Hospital Stay (HOSPITAL_COMMUNITY)

## 2023-06-17 DIAGNOSIS — J189 Pneumonia, unspecified organism: Secondary | ICD-10-CM | POA: Diagnosis not present

## 2023-06-17 DIAGNOSIS — I159 Secondary hypertension, unspecified: Secondary | ICD-10-CM | POA: Diagnosis not present

## 2023-06-17 DIAGNOSIS — A419 Sepsis, unspecified organism: Secondary | ICD-10-CM | POA: Diagnosis not present

## 2023-06-17 DIAGNOSIS — J9601 Acute respiratory failure with hypoxia: Secondary | ICD-10-CM | POA: Diagnosis not present

## 2023-06-17 LAB — BASIC METABOLIC PANEL
Anion gap: 10 (ref 5–15)
BUN: 18 mg/dL (ref 8–23)
CO2: 23 mmol/L (ref 22–32)
Calcium: 8.1 mg/dL — ABNORMAL LOW (ref 8.9–10.3)
Chloride: 106 mmol/L (ref 98–111)
Creatinine, Ser: 1.42 mg/dL — ABNORMAL HIGH (ref 0.61–1.24)
GFR, Estimated: 49 mL/min — ABNORMAL LOW (ref >60)
Glucose, Bld: 94 mg/dL (ref 70–99)
Potassium: 3.7 mmol/L (ref 3.5–5.1)
Sodium: 139 mmol/L (ref 135–145)

## 2023-06-17 LAB — LACTIC ACID, PLASMA: Lactic Acid, Venous: 0.9 mmol/L (ref 0.5–1.9)

## 2023-06-17 MED ORDER — BUDESONIDE 0.25 MG/2ML IN SUSP
RESPIRATORY_TRACT | Status: AC
  Filled 2023-06-17: qty 2

## 2023-06-17 MED ORDER — BUDESONIDE 0.25 MG/2ML IN SUSP
0.2500 mg | Freq: Two times a day (BID) | RESPIRATORY_TRACT | Status: DC
  Administered 2023-06-17 – 2023-06-26 (×19): 0.25 mg via RESPIRATORY_TRACT
  Filled 2023-06-17 (×18): qty 2

## 2023-06-17 MED ORDER — SODIUM CHLORIDE 0.9 % IV SOLN
3.0000 g | Freq: Four times a day (QID) | INTRAVENOUS | Status: DC
  Administered 2023-06-17 – 2023-06-19 (×8): 3 g via INTRAVENOUS
  Filled 2023-06-17 (×8): qty 8

## 2023-06-17 MED ORDER — REVEFENACIN 175 MCG/3ML IN SOLN
175.0000 ug | Freq: Every day | RESPIRATORY_TRACT | Status: DC
  Administered 2023-06-17 – 2023-06-26 (×9): 175 ug via RESPIRATORY_TRACT
  Filled 2023-06-17 (×9): qty 3

## 2023-06-17 MED ORDER — FUROSEMIDE 10 MG/ML IJ SOLN
40.0000 mg | Freq: Once | INTRAMUSCULAR | Status: AC
  Administered 2023-06-17: 40 mg via INTRAVENOUS
  Filled 2023-06-17: qty 4

## 2023-06-17 MED ORDER — ARFORMOTEROL TARTRATE 15 MCG/2ML IN NEBU
15.0000 ug | INHALATION_SOLUTION | Freq: Two times a day (BID) | RESPIRATORY_TRACT | Status: DC
  Administered 2023-06-17 – 2023-06-26 (×19): 15 ug via RESPIRATORY_TRACT
  Filled 2023-06-17 (×19): qty 2

## 2023-06-17 NOTE — Progress Notes (Signed)
 Physical Therapy Treatment Patient Details Name: Steven Guerrero MRN: [id number] DOB: 12/17/1940 Today's Date: 06/17/2023   History of Present Illness Pt is 83 yo male who presents on 06/15/23 with SOB, wheezing, and productive cough. CT concerning for PNA. Code sepsis called in ED. PMH: CVA, TBI 2003, PE, OSA not on CPAP, CKD3, GERD, COPD/ emphysema, chronic bronchitis, lung nodules, R THA    PT Comments  Patient with limited progress due to increased O2 requirement and still desats with mobility.  He may need inpatient rehab prior to d/c home depending on progress with activity tolerance and mobility.  Today needing heavy mod A for sit to stand and limited standing with uncontrolled descent.  PT will continue to follow.     If plan is discharge home, recommend the following: A lot of help with walking and/or transfers;A lot of help with bathing/dressing/bathroom;Assist for transportation;Assistance with cooking/housework   Can travel by private vehicle     No  Equipment Recommendations  None recommended by PT    Recommendations for Other Services       Precautions / Restrictions Precautions Precautions: Fall Precaution/Restrictions Comments: fell 2x in Feb; high O2 requirement     Mobility  Bed Mobility Overal bed mobility: Needs Assistance Bed Mobility: Supine to Sit, Sit to Supine     Supine to sit: Used rails, Min assist, HOB elevated Sit to supine: Min assist   General bed mobility comments: increased time, assist for balance coming upright and for legs into bed    Transfers Overall transfer level: Needs assistance Equipment used: Rolling walker (2 wheels) Transfers: Sit to/from Stand Sit to Stand: Mod assist           General transfer comment: heavy mod A to stand; sat with uncontrolled descent    Ambulation/Gait                   Stairs             Wheelchair Mobility     Tilt Bed    Modified Rankin (Stroke Patients Only)        Balance Overall balance assessment: Needs assistance   Sitting balance-Leahy Scale: Fair Sitting balance - Comments: on EOB unsupported though pt coughing, positive sputum, runny nose constant repositioning with sats as low as 85%; improved with rest and recovery into 90's though with increased time   Standing balance support: Bilateral upper extremity supported Standing balance-Leahy Scale: Poor Standing balance comment: stood briefly with min A with RW support                            Communication Communication Communication: Impaired Factors Affecting Communication: Reduced clarity of speech  Cognition Arousal: Alert Behavior During Therapy: WFL for tasks assessed/performed   PT - Cognitive impairments: Difficult to assess Difficult to assess due to: Impaired communication                       Following commands: Intact      Cueing Cueing Techniques: Verbal cues, Gestural cues  Exercises      General Comments General comments (skin integrity, edema, etc.): On 40L HHFNC 95% FiO2 and pt dropping to 85% with mobility at EOB; increased time to recover with pt coughing with sputum production and runny nose.  Wife & daughter present at bedside      Pertinent Vitals/Pain Pain Assessment Faces Pain Scale: Hurts even more Pain Location:  abdomen from coughing Pain Descriptors / Indicators: Sore Pain Intervention(s): Monitored during session, Repositioned    Home Living                          Prior Function            PT Goals (current goals can now be found in the care plan section) Progress towards PT goals: Not progressing toward goals - comment    Frequency    Min 2X/week      PT Plan      Co-evaluation              AM-PAC PT "6 Clicks" Mobility   Outcome Measure  Help needed turning from your back to your side while in a flat bed without using bedrails?: A Little Help needed moving from lying on your back to  sitting on the side of a flat bed without using bedrails?: A Little Help needed moving to and from a bed to a chair (including a wheelchair)?: A Lot   Help needed to walk in hospital room?: Total Help needed climbing 3-5 steps with a railing? : Total 6 Click Score: 10    End of Session Equipment Utilized During Treatment: Gait belt;Oxygen Activity Tolerance: Patient limited by fatigue;Treatment limited secondary to medical complications (Comment) (desat on 40L HHFNC) Patient left: in bed;with call bell/phone within reach;with family/visitor present;with nursing/sitter in room   PT Visit Diagnosis: Unsteadiness on feet (R26.81);Muscle weakness (generalized) (M62.81);History of falling (Z91.81);Difficulty in walking, not elsewhere classified (R26.2)     Time: 1610-9604 PT Time Calculation (min) (ACUTE ONLY): 24 min  Charges:    $Therapeutic Activity: 23-37 mins PT General Charges $$ ACUTE PT VISIT: 1 Visit                     Sheran Lawless, PT Acute Rehabilitation Services Office:[phone] 06/17/2023    Steven Guerrero 06/17/2023, 5:45 PM

## 2023-06-17 NOTE — Plan of Care (Signed)
  Problem: Health Behavior/Discharge Planning: Goal: Ability to manage health-related needs will improve Outcome: Progressing   Problem: Clinical Measurements: Goal: Ability to maintain clinical measurements within normal limits will improve Outcome: Progressing Goal: Will remain free from infection Outcome: Progressing Goal: Cardiovascular complication will be avoided Outcome: Progressing   Problem: Activity: Goal: Risk for activity intolerance will decrease Outcome: Progressing   Problem: Elimination: Goal: Will not experience complications related to urinary retention Outcome: Progressing   Problem: Pain Managment: Goal: General experience of comfort will improve and/or be controlled Outcome: Progressing   Problem: Skin Integrity: Goal: Risk for impaired skin integrity will decrease Outcome: Progressing

## 2023-06-17 NOTE — Progress Notes (Signed)
   06/17/23 1534  TOC Brief Assessment  Insurance and Status Reviewed [facility] Medicare)  Patient has primary care physician Yes (Busy - Default status  Merri Brunette, MD)  Home environment has been reviewed From home with Wife  Prior level of function: dependent  does eat soft diet at home with smalll bites per Wife  Prior/Current Home Services No current home services  Social Drivers of Health Review SDOH reviewed no interventions necessary  Readmission risk has been reviewed Yes (21%)  Transition of care needs transition of care needs identified, TOC will continue to follow   TOC needs to follow for possible Home IVABX  Pam from [employer] notified

## 2023-06-17 NOTE — Progress Notes (Signed)
 Occupational Therapy Treatment Patient Details Name: Steven Guerrero MRN: [id number] DOB: October 08, 1940 Today's Date: 06/17/2023   History of present illness Pt is 83 yo male who presents on 06/15/23 with SOB, wheezing, and productive cough. CT concerning for PNA. Code sepsis called in ED. PMH: CVA, TBI 2003, PE, OSA not on CPAP, CKD3, GERD, COPD/ emphysema, chronic bronchitis, lung nodules, R THA   OT comments  Today pt on 40L HHFNC and remains limited in activity tolerance. Pt opted to perform bed level exercises instead of OOB given the level of exertion it took for him to get EOB with other staff today. Educated pt on bed level exercises to perform independently, needs handout. Pt demonstrated compliance with IS and Ocapella use to promote lung functioning, he also verbalized understanding of energy conservation strategies. OT to continue following pt acutely to address listed deficits and help transition to next level of care. With pt's functional decline in strength, activity tolerance, and mobility DC recs changed to skilled rehab <3hrs of inpatient therapy.       If plan is discharge home, recommend the following:  A little help with bathing/dressing/bathroom;A lot of help with walking and/or transfers;Assistance with cooking/housework;Direct supervision/assist for medications management;Direct supervision/assist for financial management;Help with stairs or ramp for entrance;Supervision due to cognitive status   Equipment Recommendations  None recommended by OT (All DME needs met)    Recommendations for Other Services      Precautions / Restrictions Precautions Precautions: Fall Recall of Precautions/Restrictions: Intact Precaution/Restrictions Comments: fell 2x in Feb; high O2 requirement Restrictions Weight Bearing Restrictions Per Provider Order: No       Mobility Bed Mobility               General bed mobility comments: deferred. Pt declined and opted for exercises bed  level    Transfers                         Balance                                           ADL either performed or assessed with clinical judgement   ADL                                         General ADL Comments: focused session on bed level exercises. reinforeced IS and Ocapella use, pt able to pull currently with IS. cues for pacing self, educated pt on the 5ps of energy conservation with an emphasis on pacing, positioning, and pursed lip breathing.    Extremity/Trunk Assessment Upper Extremity Assessment RUE Deficits / Details: Decreased ROM from previous stroke, limited shoulder flexion from past shoulder injuries            Vision       Perception     Praxis     Communication Communication Communication: Impaired Factors Affecting Communication: Reduced clarity of speech   Cognition Arousal: Alert Behavior During Therapy: WFL for tasks assessed/performed Cognition: History of cognitive impairments, No family/caregiver present to determine baseline                               Following commands: Intact  Cueing   Cueing Techniques: Verbal cues, Gestural cues  Exercises General Exercises - Upper Extremity Elbow Flexion: AROM, Both, 10 reps, Theraband, Strengthening, Supine Theraband Level (Elbow Flexion): Level 3 (Green) Elbow Extension: AROM, Both, Theraband, Strengthening, Supine Theraband Level (Elbow Extension): Level 3 (Green) General Exercises - Lower Extremity Quad Sets: AROM, Both, 10 reps, Supine Hip ABduction/ADduction: AROM, Both, 5 reps, Supine    Shoulder Instructions       General Comments Pt on 40L HHFN 95% Fi02, sp02 >90% with bed level exercises and HR up to 106 bpm. Family at pt bedside    Pertinent Vitals/ Pain       Pain Assessment Pain Assessment: No/denies pain  Home Living                                          Prior  Functioning/Environment              Frequency  Min 2X/week        Progress Toward Goals  OT Goals(current goals can now be found in the care plan section)  Progress towards OT goals: Progressing toward goals  Acute Rehab OT Goals Patient Stated Goal: To go home OT Goal Formulation: With patient Time For Goal Achievement: 06/29/23 Potential to Achieve Goals: Good ADL Goals Pt/caregiver will Perform Home Exercise Program: Increased strength;Both right and left upper extremity;With written HEP provided;Independently;With theraband  Plan      Co-evaluation                 AM-PAC OT "6 Clicks" Daily Activity     Outcome Measure   Help from another person eating meals?: None Help from another person taking care of personal grooming?: A Little Help from another person toileting, which includes using toliet, bedpan, or urinal?: A Lot Help from another person bathing (including washing, rinsing, drying)?: A Lot Help from another person to put on and taking off regular upper body clothing?: A Little Help from another person to put on and taking off regular lower body clothing?: A Lot 6 Click Score: 16    End of Session Equipment Utilized During Treatment: Other (comment) (theraband)  OT Visit Diagnosis: Unsteadiness on feet (R26.81);Other abnormalities of gait and mobility (R26.89);Repeated falls (R29.6);Muscle weakness (generalized) (M62.81);History of falling (Z91.81)   Activity Tolerance Patient tolerated treatment well   Patient Left in bed;with call bell/phone within reach;with bed alarm set;with family/visitor present   Nurse Communication Mobility status        Time: 1725-1751 OT Time Calculation (min): 26 min  Charges: OT General Charges $OT Visit: 1 Visit OT Treatments $Therapeutic Exercise: 23-37 mins  06/17/2023  AB, OTR/L  Acute Rehabilitation Services  Office: [phone]   Tristan Schroeder 06/17/2023, 6:06 PM

## 2023-06-17 NOTE — Progress Notes (Addendum)
 PROGRESS NOTE        PATIENT DETAILS Name: Steven Guerrero Age: 83 y.o. Sex: male Date of Birth: Dec 17, 1940 Admit Date: 06/14/2023 Admitting Physician Darlin Drop, DO XLK:[id number], Zollie Beckers, MD  Brief Summary: Patient is a 83 y.o.  male with history of VTE on Xarelto, COPD, CVA, chronic dysphagia, OSA not on CPAP-who presented with shortness of breath/cough/wheezing and 1 episode of vomiting-patient was found to have acute hypoxic respiratory failure secondary pneumonia and subsequently admitted to the hospitalist service.  Significant events: 3/23>> admitted [facility]-SOB-presumed pneumonia.  Significant studies: 3/23>> CT chest/abdomen/pelvis: Developing infiltrates in lung bases.  Significant microbiology data: 3/23>> COVID/influenza/RSV PCR: Negative 3/23>> blood culture: No growth 3/25>> sputum culture:  pending.  Procedures: None  Consults: None  Subjective: More hypoxic-requiring up to 5-6 L of oxygen this morning.  Appears comfortable though.  Does get short of breath with minimal movement.  Objective: Vitals: Blood pressure (!) 148/79, pulse 78, temperature 98.6 F (37 C), temperature source Axillary, resp. rate 20, SpO2 (!) 87%.   Exam: Gen Exam:Alert awake-not in any distress HEENT:atraumatic, normocephalic Chest: Bibasilar rales CVS:S1S2 regular Abdomen:soft non tender, non distended Extremities:no edema Neurology: Non focal-but has generalized weakness. Skin: no rash  Pertinent Labs/Radiology:    Latest Ref Rng & Units 06/15/2023    3:52 AM 06/15/2023    3:21 AM 06/14/2023    8:06 PM  CBC  WBC 4.0 - 10.5 K/uL  11.9  10.1   Hemoglobin 13.0 - 17.0 g/dL 27.2  53.6  64.4   Hematocrit 39.0 - 52.0 % 32.0  32.3  39.0   Platelets 150 - 400 K/uL  153  163     Lab Results  Component Value Date   NA 139 06/17/2023   K 3.7 06/17/2023   CL 106 06/17/2023   CO2 23 06/17/2023      Assessment/Plan: Severe sepsis with acute hypoxic  respiratory failure secondary to aspiration pneumonia Worsening hypoxia-requiring up to 5-6 L of HFNC Change antibiotics to Unasyn 1 dose of IV Lasix-although no obvious volume overload Scheduled bronchodilators Out of bed to chair Incentive spirometry/flutter valve Watch closely-if deteriorates-May need to consider hospice/end-of-life discussion with family.  Dysphagia Chronic issue-evaluated by SLP-on dysphagia 3 diet.  History of VTE On Xarelto  CKD stage IIIb At baseline Follow electrolytes periodically  COPD Scheduled bronchodilators  OSA CPAP nightly  HLD Statin  HTN BP stable Continue amlodipine Resume losartan when able.  BPH Flomax  4.4 cm thoracic ascending aortic aneurysm Incidental finding on CT imaging. Radiology recommending annual CTA/MRA.  BMI: Estimated body mass index is 24.55 kg/m as calculated from the following:   Height as of 06/03/23: 5\' 11"  (1.803 m).   Weight as of 06/03/23: 79.8 kg.   Code status:   Code Status: Limited: Do not attempt resuscitation (DNR) -DNR-LIMITED -Do Not Intubate/DNI    DVT Prophylaxis: rivaroxaban (XARELTO) tablet 10 mg Start: 06/15/23 1000 rivaroxaban (XARELTO) tablet 10 mg    Family Communication: Spouse at bedside   Disposition Plan: Status is: Inpatient Remains inpatient appropriate because: Severity of illness   Planned Discharge Destination:Home   Diet: Diet Order             DIET DYS 3 Room service appropriate? Yes; Fluid consistency: Thin  Diet effective now  Antimicrobial agents: Anti-infectives (From admission, onward)    Start     Dose/Rate Route Frequency Ordered Stop   06/17/23 1000  Ampicillin-Sulbactam (UNASYN) 3 g in sodium chloride 0.9 % 100 mL IVPB        3 g 200 mL/hr over 30 Minutes Intravenous Every 6 hours 06/17/23 0911     06/15/23 2230  azithromycin (ZITHROMAX) 500 mg in sodium chloride 0.9 % 250 mL IVPB  Status:  Discontinued        500  mg 250 mL/hr  Intravenous Every 24 hours 06/15/23 0410 06/17/23 0911   06/15/23 2200  cefTRIAXone (ROCEPHIN) 2 g in sodium chloride 0.9 % 100 mL IVPB  Status:  Discontinued        2 g 200 mL/hr over 30 Minutes Intravenous Every 24 hours 06/15/23 0410 06/17/23 0911   06/14/23 2330  cefTRIAXone (ROCEPHIN) 2 g in sodium chloride 0.9 % 100 mL IVPB        2 g 200 mL/hr over 30 Minutes Intravenous  Once 06/14/23 2326 06/15/23 0025   06/14/23 2330  azithromycin (ZITHROMAX) 500 mg in sodium chloride 0.9 % 250 mL IVPB        500 mg 250 mL/hr over 60 Minutes Intravenous  Once 06/14/23 2326 06/15/23 0056        MEDICATIONS: Scheduled Meds:  amLODipine  5 mg Oral Daily   arformoterol  15 mcg Nebulization BID   atorvastatin  20 mg Oral Daily   budesonide (PULMICORT) nebulizer solution  0.25 mg Nebulization BID   citalopram  20 mg Oral QHS   cyanocobalamin  1,000 mcg Oral Daily   multivitamin with minerals  1 tablet Oral Daily   pantoprazole  40 mg Oral Daily   revefenacin  175 mcg Nebulization Daily   rivaroxaban  10 mg Oral Daily   tamsulosin  0.4 mg Oral Daily   Continuous Infusions:  ampicillin-sulbactam (UNASYN) IV     PRN Meds:.acetaminophen, guaiFENesin-dextromethorphan, hydrALAZINE, ipratropium-albuterol, melatonin, menthol-cetylpyridinium, mouth rinse, phenol, polyethylene glycol, prochlorperazine, simethicone   I have personally reviewed following labs and imaging studies  LABORATORY DATA: CBC: Recent Labs  Lab 06/14/23 2006 06/15/23 0321 06/15/23 0352  WBC 10.1 11.9*  --   NEUTROABS 7.9* 11.3*  --   HGB 12.9* 10.6* 10.9*  HCT 39.0 32.3* 32.0*  MCV 98.5 99.7  --   PLT 163 153  --     Basic Metabolic Panel: Recent Labs  Lab 06/14/23 2006 06/15/23 0321 06/15/23 0352 06/16/23 0444 06/17/23 0431  NA 139 138 138 138 139  K 3.6 3.6 3.6 3.9 3.7  CL 106 109  --  111 106  CO2 20* 17*  --  22 23  GLUCOSE 147* 192*  --  100* 94  BUN 24* 22  --  19 18  CREATININE  1.56* 1.49*  --  1.28* 1.42*  CALCIUM 9.2 8.1*  --  8.1* 8.1*  MG  --  1.6*  --  1.9  --   PHOS  --  3.0  --   --   --     GFR: CrCl cannot be calculated (Unknown ideal weight.).  Liver Function Tests: Recent Labs  Lab 06/14/23 2006 06/15/23 0321  AST 23 27  ALT 18 15  ALKPHOS 54 43  BILITOT 1.1 0.6  PROT 7.1 5.6*  ALBUMIN 3.7 2.8*   No results for input(s): "LIPASE", "AMYLASE" in the last 168 hours. No results for input(s): "AMMONIA" in the last 168 hours.  Coagulation Profile:  Recent Labs  Lab 06/14/23 2006  INR 1.2    Cardiac Enzymes: No results for input(s): "CKTOTAL", "CKMB", "CKMBINDEX", "TROPONINI" in the last 168 hours.  BNP (last 3 results) No results for input(s): "PROBNP" in the last 8760 hours.  Lipid Profile: No results for input(s): "CHOL", "HDL", "LDLCALC", "TRIG", "CHOLHDL", "LDLDIRECT" in the last 72 hours.  Thyroid Function Tests: No results for input(s): "TSH", "T4TOTAL", "FREET4", "T3FREE", "THYROIDAB" in the last 72 hours.  Anemia Panel: No results for input(s): "VITAMINB12", "FOLATE", "FERRITIN", "TIBC", "IRON", "RETICCTPCT" in the last 72 hours.  Urine analysis:    Component Value Date/Time   COLORURINE YELLOW 06/15/2023 0309   APPEARANCEUR CLEAR 06/15/2023 0309   LABSPEC 1.010 06/15/2023 0309   PHURINE 5.5 06/15/2023 0309   GLUCOSEU NEGATIVE 06/15/2023 0309   HGBUR NEGATIVE 06/15/2023 0309   BILIRUBINUR NEGATIVE 06/15/2023 0309   KETONESUR NEGATIVE 06/15/2023 0309   PROTEINUR NEGATIVE 06/15/2023 0309   UROBILINOGEN 0.2 07/14/2009 1843   NITRITE NEGATIVE 06/15/2023 0309   LEUKOCYTESUR NEGATIVE 06/15/2023 0309    Sepsis Labs: Lactic Acid, Venous    Component Value Date/Time   LATICACIDVEN 0.9 06/17/2023 0431    MICROBIOLOGY: Recent Results (from the past 240 hours)  Blood Culture (routine x 2)     Status: None (Preliminary result)   Collection Time: 06/14/23  8:06 PM   Specimen: BLOOD  Result Value Ref Range Status    Specimen Description BLOOD RIGHT ARM  Final   Special Requests   Final    BOTTLES DRAWN AEROBIC AND ANAEROBIC Blood Culture adequate volume   Culture   Final    NO GROWTH 3 DAYS Performed at [facility], [address] [place], [place] [address]    Report Status PENDING  Incomplete  Resp panel by RT-PCR (RSV, Flu A&B, Covid) Anterior Nasal Swab     Status: None   Collection Time: 06/14/23  8:27 PM   Specimen: Anterior Nasal Swab  Result Value Ref Range Status   SARS Coronavirus 2 by RT PCR NEGATIVE NEGATIVE Final   Influenza A by PCR NEGATIVE NEGATIVE Final   Influenza B by PCR NEGATIVE NEGATIVE Final    Comment: (NOTE) The Xpert Xpress SARS-CoV-2/FLU/RSV plus assay is intended as an aid in the diagnosis of influenza from Nasopharyngeal swab specimens and should not be used as a sole basis for treatment. Nasal washings and aspirates are unacceptable for Xpert Xpress SARS-CoV-2/FLU/RSV testing.  Fact Sheet for Patients: BloggerCourse.com  Fact Sheet for Healthcare Providers: SeriousBroker.it  This test is not yet approved or cleared by the [place] FDA and has been authorized for detection and/or diagnosis of SARS-CoV-2 by FDA under an Emergency Use Authorization (EUA). This EUA will remain in effect (meaning this test can be used) for the duration of the COVID-19 declaration under Section 564(b)(1) of the Act, 21 U.S.C. section 360bbb-3(b)(1), unless the authorization is terminated or revoked.     Resp Syncytial Virus by PCR NEGATIVE NEGATIVE Final    Comment: (NOTE) Fact Sheet for Patients: BloggerCourse.com  Fact Sheet for Healthcare Providers: SeriousBroker.it  This test is not yet approved or cleared by the [place] FDA and has been authorized for detection and/or diagnosis of SARS-CoV-2 by FDA under an Emergency Use Authorization (EUA). This EUA will  remain in effect (meaning this test can be used) for the duration of the COVID-19 declaration under Section 564(b)(1) of the Act, 21 U.S.C. section 360bbb-3(b)(1), unless the authorization is terminated or revoked.  Performed at [facility]  [facility], [address] [place], [place] [address]   Blood Culture (routine x 2)     Status: None (Preliminary result)   Collection Time: 06/14/23  8:30 PM   Specimen: BLOOD  Result Value Ref Range Status   Specimen Description BLOOD LEFT ARM  Final   Special Requests   Final    BOTTLES DRAWN AEROBIC AND ANAEROBIC Blood Culture adequate volume   Culture   Final    NO GROWTH 3 DAYS Performed at [facility], [address], [place], [place] [address]    Report Status PENDING  Incomplete  Expectorated Sputum Assessment w Gram Stain, Rflx to Resp Cult     Status: None   Collection Time: 06/16/23  4:48 AM   Specimen: Expectorated Sputum  Result Value Ref Range Status   Specimen Description EXPECTORATED SPUTUM  Final   Special Requests NONE  Final   Sputum evaluation   Final    THIS SPECIMEN IS ACCEPTABLE FOR SPUTUM CULTURE Performed at [facility], [address] [place], [place] [address]    Report Status 06/16/2023 FINAL  Final  Culture, Respiratory w Gram Stain     Status: None (Preliminary result)   Collection Time: 06/16/23  4:48 AM  Result Value Ref Range Status   Specimen Description EXPECTORATED SPUTUM  Final   Special Requests NONE Reflexed from [id number]  Final   Gram Stain   Final    FEW WBC PRESENT, PREDOMINANTLY PMN FEW GRAM NEGATIVE RODS FEW GRAM POSITIVE COCCI Performed at [facility], [address] [place], [place] [address]    Culture PENDING  Incomplete   Report Status PENDING  Incomplete    RADIOLOGY STUDIES/RESULTS: DG Chest Port 1V same Day Result Date: 06/17/2023 CLINICAL DATA:  Shortness of breath today.  Cough. EXAM: PORTABLE CHEST 1 VIEW COMPARISON:  Chest radiographs 06/15/2023, 06/14/2023,  04/30/2023; CT chest 06/14/2023 FINDINGS: Redemonstration of calcific densities overlying the lateral left hemithorax and bilateral pleurodesis diaphragmatic borders consistent with bilateral calcified pleural plaques as seen on prior radiographs and CT. Cardiac silhouette and mediastinal contours are within normal limits with moderate calcification within the aortic arch and descending thoracic aorta. Bilateral basilar interstitial thickening and homogeneous opacities are very similar to multiple prior comparison radiographs and favored to represent chronic scarring and atelectasis. No definite pleural effusion. No pneumothorax. Moderate biapical pleural scarring. No acute skeletal abnormality. IMPRESSION: 1. No acute cardiopulmonary process. 2. Bilateral calcified pleural plaques and chronic bibasilar scarring and atelectasis. Electronically Signed   By: Neita Garnet M.D.   On: 06/17/2023 08:49   DG Swallowing Func-Speech Pathology Result Date: 06/16/2023 Table formatting from the original result was not included. Modified Barium Swallow Study Patient Details Name: ETHEL VERONICA MRN: [id number] Date of Birth: [birth year] Today's Date: 06/16/2023 HPI/PMH: HPI: Onofre Gains Calamari is a 83 y.o. male with medical history significant for previous CVA in 2001, TBI in 2003, history of PE on Xarelto, OSA not on CPAP, CKD 3B, GERD, COPD/emphysema, chronic bronchitis, subcentimeter lung nodules followed by pulmonary, presents to the ER from home via EMS with complaints of sudden onset shortness of breath, productive cough, and wheezing today.  Associated with 1 episode of vomiting.  EMS was activated.  Upon EMS assessment, the patient was found to be febrile with a Tmax 103.  Patient took Tylenol prior to arrival to the ER.  En route, he received albuterol nebs, Atrovent nebs, IV Solu-Medrol 125 mg x 1.  Per  his wife he has had episodes of chocking on his food.  Eats a soft diet with small bites.  In the ER, respiratory status  appeared to be improving.  CT chest abdomen pelvis revealed bibasilar infiltrates with concern for developing pneumonia. Clinical Impression: Pt presents with a moderate oropharyngeald dysphagia c/b delayed swallow intiation, reduced base of tongue retraction, incomplete larynagel closure, decreased hyolaryngeal elevation and excursion, and diminished sensation.  Also of note pt has a previously idendified Zenker's diverticulum with backflow noted to pyriform sinuses without penetration/aspiration.  Collection of residue remained in diverticulum on esophageal sweep.  These deficits resulted in trace, silent penetration of thin liquid with serial cup sips to the level of the vocal folds.  It was very difficult to clear penetration.  Cued cough was not effective. Following liquid with heavier bolus trials did not clear penetration, but aspiration was not observed.  Pt tolerated serial straws sips, however with good laryngeal closure and no penetration was noted.  During pill simulation there was was prolonged stasis of tablet in vallecula.  Multiple puree boluses and thin liquid was were trialed to clear.  There was aspiration of liquid wash during these trials which was not sensed.  Medications should be given crushed with puree if permissible.  Recommend continuing mechanical soft diet with thin liquids by single cup sips. Consider intevention for Zenker's diverticulum if indicated. DIGEST Swallow Severity Rating*  Safety: 1  Efficiency: 2  Overall Pharyngeal Swallow Severity: 2 1: mild; 2: moderate; 3: severe; 4: profound *The Dynamic Imaging Grade of Swallowing Toxicity is standardized for the head and neck cancer population, however, demonstrates promising clinical applications across populations to standardize the clinical rating of pharyngeal swallow safety and severity. Factors that may increase risk of adverse event in presence of aspiration Rubye Oaks [employer] Clearance Coots 2021): No data recorded Recommendations/Plan:  Swallowing Evaluation Recommendations Swallowing Evaluation Recommendations Recommendations: PO diet PO Diet Recommendation: Dysphagia 3 (Mechanical soft); Thin liquids (Level 0) Liquid Administration via: Cup Medication Administration: Crushed with puree Supervision: Intermittent supervision/cueing for swallowing strategies Swallowing strategies  : Slow rate; Small bites/sips Postural changes: Position pt fully upright for meals; Stay upright 30-60 min after meals Oral care recommendations: Oral care BID (2x/day) Recommended consults: Consider ENT consultation (for Zenker's if indicated) Treatment Plan Treatment Plan Treatment recommendations: Therapy as outlined in treatment plan below Follow-up recommendations: -- (ST at next level of care) Functional status assessment: Patient has had a recent decline in their functional status and demonstrates the ability to make significant improvements in function in a reasonable and predictable amount of time. Treatment frequency: Min 2x/week Treatment duration: 2 weeks Interventions: Aspiration precaution training; Diet toleration management by SLP Recommendations Recommendations for follow up therapy are one component of a multi-disciplinary discharge planning process, led by the attending physician.  Recommendations may be updated based on patient status, additional functional criteria and insurance authorization. Assessment: Orofacial Exam: Orofacial Exam Oral Cavity - Dentition: Missing dentition Orofacial Anatomy: WFL Oral Motor/Sensory Function: -- (See BSE) Anatomy: Anatomy: -- (Previously identified Zenker's diverticulum) Boluses Administered: Boluses Administered Boluses Administered: Thin liquids (Level 0); Mildly thick liquids (Level 2, nectar thick); Moderately thick liquids (Level 3, honey thick); Puree; Solid  Oral Impairment Domain: Oral Impairment Domain Lip Closure: Interlabial escape, no progression to anterior lip Tongue control during bolus hold:  Posterior escape of less than half of bolus Bolus preparation/mastication: Timely and efficient chewing and mashing Bolus transport/lingual motion: Brisk tongue motion Oral residue: Trace residue lining oral structures Location of  oral residue : Tongue Initiation of pharyngeal swallow : Valleculae  Pharyngeal Impairment Domain: Pharyngeal Impairment Domain Soft palate elevation: No bolus between soft palate (SP)/pharyngeal wall (PW) Laryngeal elevation: Partial superior movement of thyroid cartilage/partial approximation of arytenoids to epiglottic petiole Anterior hyoid excursion: Partial anterior movement Epiglottic movement: Complete inversion Laryngeal vestibule closure: Incomplete, narrow column air/contrast in laryngeal vestibule Pharyngeal stripping wave : Present - diminished Pharyngeal contraction (A/P view only): N/A Pharyngoesophageal segment opening: Complete distension and complete duration, no obstruction of flow Tongue base retraction: Narrow column of contrast or air between tongue base and PPW Pharyngeal residue: Collection of residue within or on pharyngeal structures Location of pharyngeal residue: Valleculae; Pyriform sinuses  Esophageal Impairment Domain: Esophageal Impairment Domain Esophageal clearance upright position: Esophageal retention with retrograde flow through the PES (2/2 diverticulum) Pill: Pill Consistency administered: Thin liquids (Level 0); Puree Thin liquids (Level 0): Impaired (see clinical impressions) Puree: Impaired (see clinical impressions) Penetration/Aspiration Scale Score: Penetration/Aspiration Scale Score 1.  Material does not enter airway: Mildly thick liquids (Level 2, nectar thick); Moderately thick liquids (Level 3, honey thick); Puree; Solid; Pill 5.  Material enters airway, CONTACTS cords and not ejected out: Thin liquids (Level 0) 8.  Material enters airway, passes BELOW cords without attempt by patient to eject out (silent aspiration) : Thin liquids (Level 0)  (During pill simulation only) Compensatory Strategies: Compensatory Strategies Compensatory strategies: Yes Supraglottic swallow: Ineffective Ineffective Supraglottic Swallow: Thin liquid (Level 0) Other(comment): Ineffective (cough) Ineffective Other(comment): Thin liquid (Level 0)   General Information: No data recorded Diet Prior to this Study: Dysphagia 3 (mechanical soft); Thin liquids (Level 0)   Temperature : Normal   Respiratory Status: Desaturations (with repositioning from bed to chair and back)   Supplemental O2: Nasal cannula   History of Recent Intubation: No  Behavior/Cognition: Alert; Cooperative; Pleasant mood No data recorded Baseline vocal quality/speech: Normal No data recorded Volitional Swallow: Able to elicit Exam Limitations: No limitations Goal Planning: Prognosis for improved oropharyngeal function: Good No data recorded No data recorded Patient/Family Stated Goal: not stated Consulted and agree with results and recommendations: Patient Pain: Pain Assessment Pain Assessment: Faces Faces Pain Scale: 4 Pain Location: throat, chest Pain Descriptors / Indicators: Aching; Sore Pain Intervention(s): Limited activity within patient's tolerance; Monitored during session; Repositioned End of Session: Start Time:SLP Start Time (ACUTE ONLY): 0913 Stop Time: SLP Stop Time (ACUTE ONLY): 0925 Time Calculation:SLP Time Calculation (min) (ACUTE ONLY): 12 min Charges: SLP Evaluations $ SLP Speech Visit: 1 Visit SLP Evaluations $BSS Swallow: 1 Procedure $MBS Swallow: 1 Procedure SLP visit diagnosis: SLP Visit Diagnosis: Dysphagia, oropharyngeal phase (R13.12) Past Medical History: Past Medical History: Diagnosis Date  Anxiety and depression   Back pain   Brain bleed (HCC)   2003  CVA (cerebral infarction)   2001  Hypercholesteremia   Hypertension   Peripheral neuropathy   Pulmonary embolism (HCC)   Sleep apnea   Stroke (HCC)   2001  TBI (traumatic brain injury) (HCC)  Past Surgical History: Past Surgical  History: Procedure Laterality Date  HERNIA REPAIR    JOINT REPLACEMENT Right   hip replacement  LEG SURGERY    Broken femur  TOTAL HIP ARTHROPLASTY    Right Kerrie Pleasure, MA, CCC-SLP Acute Rehabilitation Services Office: [phone] 06/16/2023, 10:25 AM  DG CHEST PORT 1 VIEW Result Date: 06/15/2023 CLINICAL DATA:  Abdominal pain. History of stroke, hypertension, pulmonary embolism. EXAM: PORTABLE CHEST 1 VIEW, ABDOMEN ONE VIEW COMPARISON:  08/14/2023 FINDINGS: Chest: The heart is  mildly enlarged and mediastinal contours are stable. There is atherosclerotic calcification of the aorta. Calcified pleural plaques are noted bilaterally. Stable opacities are noted in the lungs bilaterally, likely associated with pleural plaques. There is atelectasis at the lung bases. No effusion or pneumothorax is seen. No acute osseous abnormality. Abdomen: There is a nonobstructive bowel-gas pattern. Vascular calcifications are noted in the abdomen and pelvis. Total hip arthroplasty changes are noted on the right. There are degenerative changes in the lumbar spine and left hip. IMPRESSION: 1. Stable chest with no active disease. 2. Nonobstructive bowel-gas pattern. Electronically Signed   By: Thornell Sartorius M.D.   On: 06/15/2023 19:07   DG Abd 1 View Result Date: 06/15/2023 CLINICAL DATA:  Abdominal pain. History of stroke, hypertension, pulmonary embolism. EXAM: PORTABLE CHEST 1 VIEW, ABDOMEN ONE VIEW COMPARISON:  08/14/2023 FINDINGS: Chest: The heart is mildly enlarged and mediastinal contours are stable. There is atherosclerotic calcification of the aorta. Calcified pleural plaques are noted bilaterally. Stable opacities are noted in the lungs bilaterally, likely associated with pleural plaques. There is atelectasis at the lung bases. No effusion or pneumothorax is seen. No acute osseous abnormality. Abdomen: There is a nonobstructive bowel-gas pattern. Vascular calcifications are noted in the abdomen and pelvis. Total hip  arthroplasty changes are noted on the right. There are degenerative changes in the lumbar spine and left hip. IMPRESSION: 1. Stable chest with no active disease. 2. Nonobstructive bowel-gas pattern. Electronically Signed   By: Thornell Sartorius M.D.   On: 06/15/2023 19:07     LOS: 2 days   Jeoffrey Massed, MD  [facility]    To contact the attending provider between 7A-7P or the covering provider during after hours 7P-7A, please log into the web site [email or web address] and access using universal [place] password for that web site. If you do not have the password, please call the hospital operator.  06/17/2023, 11:04 AM

## 2023-06-17 NOTE — Progress Notes (Signed)
 Speech Language Pathology Treatment: Dysphagia  Patient Details Name: Steven Guerrero MRN: [id number] DOB: 1941-03-04 Today's Date: 06/17/2023 Time: 4098-1191 SLP Time Calculation (min) (ACUTE ONLY): 11 min  Assessment / Plan / Recommendation Clinical Impression  F/u for swallowing after yesterday's MBS. More hypoxic today - requiring 5-6 L this am, now on HFNC at 40L, 95% Fi02.  Reviewed MBS. Talked with Steven Guerrero and family about swallowing difficulties and precautions. He agreed to try some nectar thick liquids (no penetration/aspiration of nectars per MBS).  No s/s of aspiration observed.  We determined it made sense to drink thicker liquids the next few days as an extra precaution and to give his lungs some time to recover.  Steven Guerrero was reluctant but willing.  New swallowing sign posted at [facility].  D/W Dr. Jerral Ralph.  SLP will follow.   HPI HPI: Steven Guerrero is a 83 y.o. male with medical history significant for previous CVA in 2001, TBI in 2003, history of PE on Xarelto, OSA not on CPAP, CKD 3B, GERD, COPD/emphysema, chronic bronchitis, subcentimeter lung nodules followed by pulmonary, presents to the ER from home via EMS with complaints of sudden onset shortness of breath, productive cough, and wheezing today.  Associated with 1 episode of vomiting.  EMS was activated.  Upon EMS assessment, the patient was found to be febrile with a Tmax 103.  Patient took Tylenol prior to arrival to the ER.  En route, he received albuterol nebs, Atrovent nebs, IV Solu-Medrol 125 mg x 1.  Per his wife he has had episodes of chocking on his food.  Eats a soft diet with small bites.  In the ER, respiratory status appeared to be improving.  CT chest abdomen pelvis revealed bibasilar infiltrates with concern for developing pneumonia.      SLP Plan  Continue with current plan of care      Recommendations for follow up therapy are one component of a multi-disciplinary discharge planning process, led by the attending  physician.  Recommendations may be updated based on patient status, additional functional criteria and insurance authorization.    Recommendations  Diet recommendations: Dysphagia 3 (mechanical soft);Nectar-thick liquid Liquids provided via: Straw Medication Administration: Crushed with puree Supervision: Staff to assist with self feeding Compensations: Small sips/bites;Slow rate Postural Changes and/or Swallow Maneuvers: Seated upright 90 degrees                  Oral care BID     Dysphagia, oropharyngeal phase (R13.12)     Continue with current plan of care   Steven Guerrero L. Steven Frederic, MA CCC/SLP Clinical Specialist - Acute Care SLP Acute Rehabilitation Services Office number [phone]   Steven Guerrero Steven Guerrero  06/17/2023, 5:24 PM

## 2023-06-18 ENCOUNTER — Inpatient Hospital Stay (HOSPITAL_COMMUNITY)

## 2023-06-18 DIAGNOSIS — J189 Pneumonia, unspecified organism: Secondary | ICD-10-CM | POA: Diagnosis not present

## 2023-06-18 DIAGNOSIS — J9601 Acute respiratory failure with hypoxia: Secondary | ICD-10-CM | POA: Diagnosis not present

## 2023-06-18 DIAGNOSIS — A419 Sepsis, unspecified organism: Secondary | ICD-10-CM | POA: Diagnosis not present

## 2023-06-18 DIAGNOSIS — I159 Secondary hypertension, unspecified: Secondary | ICD-10-CM | POA: Diagnosis not present

## 2023-06-18 LAB — BASIC METABOLIC PANEL WITH GFR
Anion gap: 12 (ref 5–15)
BUN: 18 mg/dL (ref 8–23)
CO2: 26 mmol/L (ref 22–32)
Calcium: 8.2 mg/dL — ABNORMAL LOW (ref 8.9–10.3)
Chloride: 101 mmol/L (ref 98–111)
Creatinine, Ser: 1.48 mg/dL — ABNORMAL HIGH (ref 0.61–1.24)
GFR, Estimated: 47 mL/min — ABNORMAL LOW (ref >60)
Glucose, Bld: 108 mg/dL — ABNORMAL HIGH (ref 70–99)
Potassium: 2.9 mmol/L — ABNORMAL LOW (ref 3.5–5.1)
Sodium: 139 mmol/L (ref 135–145)

## 2023-06-18 LAB — BRAIN NATRIURETIC PEPTIDE: B Natriuretic Peptide: 91.7 pg/mL (ref 0.0–100.0)

## 2023-06-18 LAB — PROCALCITONIN: Procalcitonin: 2 ng/mL

## 2023-06-18 MED ORDER — POTASSIUM CHLORIDE CRYS ER 20 MEQ PO TBCR
40.0000 meq | EXTENDED_RELEASE_TABLET | ORAL | Status: AC
  Administered 2023-06-18 (×2): 40 meq via ORAL
  Filled 2023-06-18 (×2): qty 2

## 2023-06-18 MED ORDER — FUROSEMIDE 10 MG/ML IJ SOLN
40.0000 mg | Freq: Once | INTRAMUSCULAR | Status: AC
  Administered 2023-06-18: 40 mg via INTRAVENOUS
  Filled 2023-06-18: qty 4

## 2023-06-18 MED ORDER — LEVALBUTEROL HCL 0.63 MG/3ML IN NEBU
0.6300 mg | INHALATION_SOLUTION | Freq: Four times a day (QID) | RESPIRATORY_TRACT | Status: DC | PRN
  Filled 2023-06-18: qty 3

## 2023-06-18 NOTE — Progress Notes (Signed)
 PROGRESS NOTE        PATIENT DETAILS Name: Steven Guerrero Age: 83 y.o. Sex: male Date of Birth: 02/03/1941 Admit Date: 06/14/2023 Admitting Physician Darlin Drop, DO QMV:HQION, Zollie Beckers, MD  Brief Summary: Patient is a 83 y.o.  male with history of VTE on Xarelto, COPD, CVA, chronic dysphagia, OSA not on CPAP-who presented with shortness of breath/cough/wheezing and 1 episode of vomiting-patient was found to have acute hypoxic respiratory failure secondary pneumonia and subsequently admitted to the hospitalist service.  Significant events: 3/23>> admitted [facility]-SOB-presumed pneumonia.  Significant studies: 3/23>> CT chest/abdomen/pelvis: Developing infiltrates in lung bases.  Significant microbiology data: 3/23>> COVID/influenza/RSV PCR: Negative 3/23>> blood culture: No growth 3/25>> sputum culture:  pending.  Procedures: None  Consults: None  Subjective: Worsened yesterday-now on heated high flow-but feels better this morning.  Appears comfortable.  No nausea or vomiting.  No chest pain.  Objective: Vitals: Blood pressure 110/69, pulse 81, temperature 99.2 F (37.3 C), temperature source Oral, resp. rate 19, SpO2 92%.   Exam: Gen Exam:Alert awake-not in any distress HEENT:atraumatic, normocephalic Chest: Bibasilar rales. CVS:S1S2 regular Abdomen:soft non tender, non distended Extremities:no edema Neurology: Non focal Skin: no rash  Pertinent Labs/Radiology:    Latest Ref Rng & Units 06/15/2023    3:52 AM 06/15/2023    3:21 AM 06/14/2023    8:06 PM  CBC  WBC 4.0 - 10.5 K/uL  11.9  10.1   Hemoglobin 13.0 - 17.0 g/dL 62.9  52.8  41.3   Hematocrit 39.0 - 52.0 % 32.0  32.3  39.0   Platelets 150 - 400 K/uL  153  163     Lab Results  Component Value Date   NA 139 06/18/2023   K 2.9 (L) 06/18/2023   CL 101 06/18/2023   CO2 26 06/18/2023      Assessment/Plan: Severe sepsis with acute hypoxic respiratory failure secondary to aspiration  pneumonia Worsened yesterday-now on heated high flow-overnight no major issues Feels somewhat better today Continue Unasyn Repeat 1 dose of IV Lasix to ensure negative balance-although no signs of volume overload Continue scheduled bronchodilators Incentive spirometry/flutter valve Out of bed to chair Mobilize as much as possible Per my discussion with patient/spouse at bedside-no further escalation in care-we will give it another few days-and see how he progresses-but they are both agreeable and talking with palliative care for further goals of care discussion if he would not be improve.  Dysphagia Chronic issue-evaluated by SLP-on dysphagia 3 diet.  History of VTE On Xarelto  CKD stage IIIb At baseline Follow electrolytes periodically  COPD Scheduled bronchodilators  OSA CPAP nightly  HLD Statin  HTN BP stable Continue amlodipine Resume losartan when able.  BPH Flomax  4.4 cm thoracic ascending aortic aneurysm Incidental finding on CT imaging. Radiology recommending annual CTA/MRA.  BMI: Estimated body mass index is 24.55 kg/m as calculated from the following:   Height as of 06/03/23: 5\' 11"  (1.803 m).   Weight as of 06/03/23: 79.8 kg.   Code status:   Code Status: Limited: Do not attempt resuscitation (DNR) -DNR-LIMITED -Do Not Intubate/DNI    DVT Prophylaxis: rivaroxaban (XARELTO) tablet 10 mg Start: 06/15/23 1000 rivaroxaban (XARELTO) tablet 10 mg    Family Communication: Spouse at bedside   Disposition Plan: Status is: Inpatient Remains inpatient appropriate because: Severity of illness   Planned Discharge Destination:Home  Diet: Diet Order             DIET DYS 3 Room service appropriate? Yes with Assist; Fluid consistency: Nectar Thick  Diet effective now                     Antimicrobial agents: Anti-infectives (From admission, onward)    Start     Dose/Rate Route Frequency Ordered Stop   06/17/23 1000  Ampicillin-Sulbactam  (UNASYN) 3 g in sodium chloride 0.9 % 100 mL IVPB        3 g 200 mL/hr over 30 Minutes Intravenous Every 6 hours 06/17/23 0911     06/15/23 2230  azithromycin (ZITHROMAX) 500 mg in sodium chloride 0.9 % 250 mL IVPB  Status:  Discontinued        500 mg 250 mL/hr  Intravenous Every 24 hours 06/15/23 0410 06/17/23 0911   06/15/23 2200  cefTRIAXone (ROCEPHIN) 2 g in sodium chloride 0.9 % 100 mL IVPB  Status:  Discontinued        2 g 200 mL/hr over 30 Minutes Intravenous Every 24 hours 06/15/23 0410 06/17/23 0911   06/14/23 2330  cefTRIAXone (ROCEPHIN) 2 g in sodium chloride 0.9 % 100 mL IVPB        2 g 200 mL/hr over 30 Minutes Intravenous  Once 06/14/23 2326 06/15/23 0025   06/14/23 2330  azithromycin (ZITHROMAX) 500 mg in sodium chloride 0.9 % 250 mL IVPB        500 mg 250 mL/hr over 60 Minutes Intravenous  Once 06/14/23 2326 06/15/23 0056        MEDICATIONS: Scheduled Meds:  amLODipine  5 mg Oral Daily   arformoterol  15 mcg Nebulization BID   atorvastatin  20 mg Oral Daily   budesonide (PULMICORT) nebulizer solution  0.25 mg Nebulization BID   citalopram  20 mg Oral QHS   cyanocobalamin  1,000 mcg Oral Daily   multivitamin with minerals  1 tablet Oral Daily   pantoprazole  40 mg Oral Daily   potassium chloride  40 mEq Oral Q4H   revefenacin  175 mcg Nebulization Daily   rivaroxaban  10 mg Oral Daily   tamsulosin  0.4 mg Oral Daily   Continuous Infusions:  ampicillin-sulbactam (UNASYN) IV 3 g (06/18/23 0857)   PRN Meds:.acetaminophen, guaiFENesin-dextromethorphan, hydrALAZINE, ipratropium-albuterol, melatonin, menthol-cetylpyridinium, mouth rinse, phenol, polyethylene glycol, prochlorperazine, simethicone   I have personally reviewed following labs and imaging studies  LABORATORY DATA: CBC: Recent Labs  Lab 06/14/23 2006 06/15/23 0321 06/15/23 0352  WBC 10.1 11.9*  --   NEUTROABS 7.9* 11.3*  --   HGB 12.9* 10.6* 10.9*  HCT 39.0 32.3* 32.0*  MCV 98.5 99.7  --    PLT 163 153  --     Basic Metabolic Panel: Recent Labs  Lab 06/14/23 2006 06/15/23 0321 06/15/23 0352 06/16/23 0444 06/17/23 0431 06/18/23 0454  NA 139 138 138 138 139 139  K 3.6 3.6 3.6 3.9 3.7 2.9*  CL 106 109  --  111 106 101  CO2 20* 17*  --  22 23 26   GLUCOSE 147* 192*  --  100* 94 108*  BUN 24* 22  --  19 18 18   CREATININE 1.56* 1.49*  --  1.28* 1.42* 1.48*  CALCIUM 9.2 8.1*  --  8.1* 8.1* 8.2*  MG  --  1.6*  --  1.9  --   --   PHOS  --  3.0  --   --   --   --  GFR: CrCl cannot be calculated (Unknown ideal weight.).  Liver Function Tests: Recent Labs  Lab 06/14/23 2006 06/15/23 0321  AST 23 27  ALT 18 15  ALKPHOS 54 43  BILITOT 1.1 0.6  PROT 7.1 5.6*  ALBUMIN 3.7 2.8*   No results for input(s): "LIPASE", "AMYLASE" in the last 168 hours. No results for input(s): "AMMONIA" in the last 168 hours.  Coagulation Profile: Recent Labs  Lab 06/14/23 2006  INR 1.2    Cardiac Enzymes: No results for input(s): "CKTOTAL", "CKMB", "CKMBINDEX", "TROPONINI" in the last 168 hours.  BNP (last 3 results) No results for input(s): "PROBNP" in the last 8760 hours.  Lipid Profile: No results for input(s): "CHOL", "HDL", "LDLCALC", "TRIG", "CHOLHDL", "LDLDIRECT" in the last 72 hours.  Thyroid Function Tests: No results for input(s): "TSH", "T4TOTAL", "FREET4", "T3FREE", "THYROIDAB" in the last 72 hours.  Anemia Panel: No results for input(s): "VITAMINB12", "FOLATE", "FERRITIN", "TIBC", "IRON", "RETICCTPCT" in the last 72 hours.  Urine analysis:    Component Value Date/Time   COLORURINE YELLOW 06/15/2023 0309   APPEARANCEUR CLEAR 06/15/2023 0309   LABSPEC 1.010 06/15/2023 0309   PHURINE 5.5 06/15/2023 0309   GLUCOSEU NEGATIVE 06/15/2023 0309   HGBUR NEGATIVE 06/15/2023 0309   BILIRUBINUR NEGATIVE 06/15/2023 0309   KETONESUR NEGATIVE 06/15/2023 0309   PROTEINUR NEGATIVE 06/15/2023 0309   UROBILINOGEN 0.2 07/14/2009 1843   NITRITE NEGATIVE 06/15/2023 0309    LEUKOCYTESUR NEGATIVE 06/15/2023 0309    Sepsis Labs: Lactic Acid, Venous    Component Value Date/Time   LATICACIDVEN 0.9 06/17/2023 0431    MICROBIOLOGY: Recent Results (from the past 240 hours)  Blood Culture (routine x 2)     Status: None (Preliminary result)   Collection Time: 06/14/23  8:06 PM   Specimen: BLOOD  Result Value Ref Range Status   Specimen Description BLOOD RIGHT ARM  Final   Special Requests   Final    BOTTLES DRAWN AEROBIC AND ANAEROBIC Blood Culture adequate volume   Culture   Final    NO GROWTH 3 DAYS Performed at [facility], [address], [place], [place] [address]    Report Status PENDING  Incomplete  Resp panel by RT-PCR (RSV, Flu A&B, Covid) Anterior Nasal Swab     Status: None   Collection Time: 06/14/23  8:27 PM   Specimen: Anterior Nasal Swab  Result Value Ref Range Status   SARS Coronavirus 2 by RT PCR NEGATIVE NEGATIVE Final   Influenza A by PCR NEGATIVE NEGATIVE Final   Influenza B by PCR NEGATIVE NEGATIVE Final    Comment: (NOTE) The Xpert Xpress SARS-CoV-2/FLU/RSV plus assay is intended as an aid in the diagnosis of influenza from Nasopharyngeal swab specimens and should not be used as a sole basis for treatment. Nasal washings and aspirates are unacceptable for Xpert Xpress SARS-CoV-2/FLU/RSV testing.  Fact Sheet for Patients: BloggerCourse.com  Fact Sheet for Healthcare Providers: SeriousBroker.it  This test is not yet approved or cleared by the [place] FDA and has been authorized for detection and/or diagnosis of SARS-CoV-2 by FDA under an Emergency Use Authorization (EUA). This EUA will remain in effect (meaning this test can be used) for the duration of the COVID-19 declaration under Section 564(b)(1) of the Act, 21 U.S.C. section 360bbb-3(b)(1), unless the authorization is terminated or revoked.     Resp Syncytial Virus by PCR NEGATIVE NEGATIVE Final     Comment: (NOTE) Fact Sheet for Patients: BloggerCourse.com  Fact Sheet for Healthcare Providers: SeriousBroker.it  This  test is not yet approved or cleared by the [place] and has been authorized for detection and/or diagnosis of SARS-CoV-2 by FDA under an Emergency Use Authorization (EUA). This EUA will remain in effect (meaning this test can be used) for the duration of the COVID-19 declaration under Section 564(b)(1) of the Act, 21 U.S.C. section 360bbb-3(b)(1), unless the authorization is terminated or revoked.  Performed at [facility], [address], [place], [place] [address]   Blood Culture (routine x 2)     Status: None (Preliminary result)   Collection Time: 06/14/23  8:30 PM   Specimen: BLOOD  Result Value Ref Range Status   Specimen Description BLOOD LEFT ARM  Final   Special Requests   Final    BOTTLES DRAWN AEROBIC AND ANAEROBIC Blood Culture adequate volume   Culture   Final    NO GROWTH 3 DAYS Performed at [facility], [address] [place], [place] [address]    Report Status PENDING  Incomplete  Expectorated Sputum Assessment w Gram Stain, Rflx to Resp Cult     Status: None   Collection Time: 06/16/23  4:48 AM   Specimen: Expectorated Sputum  Result Value Ref Range Status   Specimen Description EXPECTORATED SPUTUM  Final   Special Requests NONE  Final   Sputum evaluation   Final    THIS SPECIMEN IS ACCEPTABLE FOR SPUTUM CULTURE Performed at [facility], [address], [place], [place] [address]    Report Status 06/16/2023 FINAL  Final  Culture, Respiratory w Gram Stain     Status: None (Preliminary result)   Collection Time: 06/16/23  4:48 AM  Result Value Ref Range Status   Specimen Description EXPECTORATED SPUTUM  Final   Special Requests NONE Reflexed from [id number]  Final   Gram Stain   Final    FEW WBC PRESENT, PREDOMINANTLY PMN FEW GRAM NEGATIVE RODS FEW GRAM  POSITIVE COCCI    Culture   Final    CULTURE REINCUBATED FOR BETTER GROWTH Performed at [facility], [address], [place], [place] [address]    Report Status PENDING  Incomplete    RADIOLOGY STUDIES/RESULTS: DG Chest Port 1 View Result Date: 06/18/2023 CLINICAL DATA:  [id number] SOB (shortness of breath) [id number] EXAM: PORTABLE CHEST 1 VIEW COMPARISON:  06/17/2023 FINDINGS: Stable heart size. Aortic atherosclerosis. Calcified pleural plaques are present bilaterally. Bibasilar atelectasis with improved aeration of the lung bases. No pleural effusion or pneumothorax. IMPRESSION: Bibasilar atelectasis with improved aeration of the lung bases. Electronically Signed   By: Duanne Guess D.O.   On: 06/18/2023 11:20   DG Chest Port 1V same Day Result Date: 06/17/2023 CLINICAL DATA:  Shortness of breath today.  Cough. EXAM: PORTABLE CHEST 1 VIEW COMPARISON:  Chest radiographs 06/15/2023, 06/14/2023, 04/30/2023; CT chest 06/14/2023 FINDINGS: Redemonstration of calcific densities overlying the lateral left hemithorax and bilateral pleurodesis diaphragmatic borders consistent with bilateral calcified pleural plaques as seen on prior radiographs and CT. Cardiac silhouette and mediastinal contours are within normal limits with moderate calcification within the aortic arch and descending thoracic aorta. Bilateral basilar interstitial thickening and homogeneous opacities are very similar to multiple prior comparison radiographs and favored to represent chronic scarring and atelectasis. No definite pleural effusion. No pneumothorax. Moderate biapical pleural scarring. No acute skeletal abnormality. IMPRESSION: 1. No acute cardiopulmonary process. 2. Bilateral calcified pleural plaques and chronic bibasilar scarring and atelectasis. Electronically Signed   By: Neita Garnet M.D.   On:  06/17/2023 08:49     LOS: 3 days   Jeoffrey Massed, MD  [facility]    To contact the attending provider between  7A-7P or the covering provider during after hours 7P-7A, please log into the web site [email or web address] and access using universal  password for that web site. If you do not have the password, please call the hospital operator.  06/18/2023, 12:08 PM

## 2023-06-18 NOTE — Progress Notes (Signed)
 Speech Language Pathology Treatment: Dysphagia  Patient Details Name: Steven Guerrero MRN: [id number] DOB: May 02, 1940 Today's Date: 06/18/2023 Time: 4098-1191 SLP Time Calculation (min) (ACUTE ONLY): 18 min  Assessment / Plan / Recommendation Clinical Impression  Patient seen for po tolerance. Patient currently on O2 via HFNC, reporting he feels "rough." Reviewed notes including initial evaluation, MBS, and yesterdays treatment session during which diet was downgraded to include nectar thick liquids given respiratory decline. Wife in room, observed to be providing thin liquids via small straw sips. Reporting that SLP from previous date changed diet to nectar thick liquids however also stated that thin water via small sips was allowed. This is not located in note. Overall, patient without overt s/s of aspiration today with nectar thick liquids consumed via small self fed straw sips and for majority of thin liquids consumed in the same manner with the exception of one time cough post swallow. Given MBS results with frequent penetration and continued respiratory decline, ultimate recommendations from this SLP are for dysphagia 3 with nectar thick liquids only. Education provided to patient and spouse. IF patient insisting on consumption of thin liquids, recommend thin H2O only, in between meals and after oral care based on the [place] free water protocol. Education regarding this protocol also provided to spouse. SLP will continue to f/u closely.   The [employer] Protocol (FFWP) allows patients with dysphagia to drink plain water (neutral pH and free of bacteria) between meals, potentially improving hydration, oral mucosa, preservation of swallowing musculature, and quality of life, while maintaining safety through strict oral hygiene and swallowing guidelines. Patients must be assessed by a speech-language pathologist (SLP) to determine if they are appropriate for the protocol. Water is allowed between  meals and 30 minutes after meals, but not during meals and only after thorough oral care.     HPI HPI: Steven Guerrero is a 83 y.o. male with medical history significant for previous CVA in 2001, TBI in 2003, history of PE on Xarelto, OSA not on CPAP, CKD 3B, GERD, COPD/emphysema, chronic bronchitis, subcentimeter lung nodules followed by pulmonary, presents to the ER from home via EMS with complaints of sudden onset shortness of breath, productive cough, and wheezing today.  Associated with 1 episode of vomiting.  EMS was activated.  Upon EMS assessment, the patient was found to be febrile with a Tmax 103.  Patient took Tylenol prior to arrival to the ER.  En route, he received albuterol nebs, Atrovent nebs, IV Solu-Medrol 125 mg x 1.  Per his wife he has had episodes of chocking on his food.  Eats a soft diet with small bites.  In the ER, respiratory status appeared to be improving.  CT chest abdomen pelvis revealed bibasilar infiltrates with concern for developing pneumonia.      SLP Plan  Continue with current plan of care     Recommendations for follow up therapy are one component of a multi-disciplinary discharge planning process, led by the attending physician.  Recommendations may be updated based on patient status, additional functional criteria and insurance authorization.    Recommendations  Diet recommendations: Dysphagia 3 (mechanical soft);Nectar-thick liquid Liquids provided via: Cup;Straw Medication Administration: Crushed with puree Supervision: Staff to assist with self feeding Compensations: Small sips/bites;Slow rate Postural Changes and/or Swallow Maneuvers: Seated upright 90 degrees;Upright 30-60 min after meal                 Oral care BID;Oral care prior to ice chip/H20  Dysphagia, oropharyngeal phase (R13.12)     Continue with current plan of care    [facility] MA, CCC-SLP  Steven Guerrero Steven Guerrero  06/18/2023, 9:32 AM

## 2023-06-18 NOTE — Care Management Important Message (Signed)
 Important Message  Patient Details  Name: Steven Guerrero MRN: [id number] Date of Birth: 1941-01-19   Important Message Given:  Yes - Medicare IM     Dorena Bodo 06/18/2023, 2:43 PM

## 2023-06-18 NOTE — Plan of Care (Signed)

## 2023-06-19 ENCOUNTER — Inpatient Hospital Stay (HOSPITAL_COMMUNITY)

## 2023-06-19 DIAGNOSIS — Z515 Encounter for palliative care: Secondary | ICD-10-CM | POA: Diagnosis not present

## 2023-06-19 DIAGNOSIS — A419 Sepsis, unspecified organism: Secondary | ICD-10-CM | POA: Diagnosis not present

## 2023-06-19 DIAGNOSIS — I159 Secondary hypertension, unspecified: Secondary | ICD-10-CM | POA: Diagnosis not present

## 2023-06-19 DIAGNOSIS — Z7189 Other specified counseling: Secondary | ICD-10-CM | POA: Diagnosis not present

## 2023-06-19 DIAGNOSIS — J189 Pneumonia, unspecified organism: Secondary | ICD-10-CM | POA: Diagnosis not present

## 2023-06-19 DIAGNOSIS — J9601 Acute respiratory failure with hypoxia: Secondary | ICD-10-CM | POA: Diagnosis not present

## 2023-06-19 LAB — CBC
HCT: 35.1 % — ABNORMAL LOW (ref 39.0–52.0)
Hemoglobin: 12 g/dL — ABNORMAL LOW (ref 13.0–17.0)
MCH: 32.4 pg (ref 26.0–34.0)
MCHC: 34.2 g/dL (ref 30.0–36.0)
MCV: 94.9 fL (ref 80.0–100.0)
Platelets: 178 10*3/uL (ref 150–400)
RBC: 3.7 MIL/uL — ABNORMAL LOW (ref 4.22–5.81)
RDW: 13.4 % (ref 11.5–15.5)
WBC: 12.2 10*3/uL — ABNORMAL HIGH (ref 4.0–10.5)
nRBC: 0 % (ref 0.0–0.2)

## 2023-06-19 LAB — CULTURE, RESPIRATORY W GRAM STAIN

## 2023-06-19 LAB — BASIC METABOLIC PANEL WITH GFR
Anion gap: 8 (ref 5–15)
BUN: 17 mg/dL (ref 8–23)
CO2: 29 mmol/L (ref 22–32)
Calcium: 8.1 mg/dL — ABNORMAL LOW (ref 8.9–10.3)
Chloride: 103 mmol/L (ref 98–111)
Creatinine, Ser: 1.63 mg/dL — ABNORMAL HIGH (ref 0.61–1.24)
GFR, Estimated: 42 mL/min — ABNORMAL LOW (ref >60)
Glucose, Bld: 131 mg/dL — ABNORMAL HIGH (ref 70–99)
Potassium: 3.8 mmol/L (ref 3.5–5.1)
Sodium: 140 mmol/L (ref 135–145)

## 2023-06-19 LAB — CULTURE, BLOOD (ROUTINE X 2)
Culture: NO GROWTH
Culture: NO GROWTH
Special Requests: ADEQUATE
Special Requests: ADEQUATE

## 2023-06-19 MED ORDER — LINEZOLID 600 MG/300ML IV SOLN
600.0000 mg | Freq: Two times a day (BID) | INTRAVENOUS | Status: DC
  Administered 2023-06-19 – 2023-06-23 (×9): 600 mg via INTRAVENOUS
  Filled 2023-06-19 (×9): qty 300

## 2023-06-19 MED ORDER — PIPERACILLIN-TAZOBACTAM 3.375 G IVPB
3.3750 g | Freq: Three times a day (TID) | INTRAVENOUS | Status: AC
  Administered 2023-06-19 – 2023-06-25 (×20): 3.375 g via INTRAVENOUS
  Filled 2023-06-19 (×21): qty 50

## 2023-06-19 NOTE — TOC CM/SW Note (Signed)
 Transition of Care [facility]) - Inpatient Brief Assessment   Patient Details  Name: CLEBURN MAIOLO MRN: [id number] Date of Birth: [birth year]  [facility] of [facility]) CM/SW Contact:    Mearl Latin, LCSW Phone Number: 06/19/2023, 9:06 AM   Clinical Narrative: TOC continuing to follow. Patient on 50L O2.    Transition of Care Asessment: Insurance and Status: Insurance coverage has been reviewed [facility] Medicare) Patient has primary care physician: Yes (Busy - Default status  Merri Brunette, MD) Home environment has been reviewed: From home with Wife Prior level of function:: dependent  does eat soft diet at home with smalll bites per Wife Prior/Current Home Services: No current home services Social Drivers of Health Review: SDOH reviewed no interventions necessary Readmission risk has been reviewed: Yes (21%) Transition of care needs: transition of care needs identified, TOC will continue to follow

## 2023-06-19 NOTE — Progress Notes (Signed)
 Pt and his wife at the bedside opted to give oral pills whole with apple sauce. Explained to them what the the speech therapist advised but insisted that patient can swallow the pills whole and that no need to be crushed. Explained the risk and consequences of possible aspiration but both of them still opt to take the pills whole.

## 2023-06-19 NOTE — Progress Notes (Signed)
 Speech Language Pathology Treatment: Dysphagia  Patient Details Name: Steven Guerrero MRN: [id number] DOB: Mar 14, 1941 Today's Date: 06/19/2023 Time: 7829-5621 SLP Time Calculation (min) (ACUTE ONLY): 8 min  Assessment / Plan / Recommendation Clinical Impression  Pt seen for ongoing dysphagia management.  Pt continue to required HHFNC (35 L).  Wife reports pt vomited liquids yesterday.  Pt exhibited multiple swallows (4+) with nectar thick liquids and puree.  Pt c/o feeling of stasis, but with known Zenker's diverticulum.  Given silent aspiration observed on MBS 3/25 it is difficult to determine if pt has had a change in swallow function at bedside.  Spoke briefly with MD who concurs with plan for repeat instrumental.  Because pt is now requiring HHFNC he cannot transport to radiology and FEES is only option for reassessment. Pt and wife agreeable, will proceed with instrumental swallow assessment.   HPI HPI: Steven Guerrero is a 83 y.o. male with medical history significant for previous CVA in 2001, TBI in 2003, history of PE on Xarelto, OSA not on CPAP, CKD 3B, GERD, COPD/emphysema, chronic bronchitis, subcentimeter lung nodules followed by pulmonary, presents to the ER from home via EMS with complaints of sudden onset shortness of breath, productive cough, and wheezing today.  Associated with 1 episode of vomiting.  EMS was activated.  Upon EMS assessment, the patient was found to be febrile with a Tmax 103.  Patient took Tylenol prior to arrival to the ER.  En route, he received albuterol nebs, Atrovent nebs, IV Solu-Medrol 125 mg x 1.  Per his wife he has had episodes of chocking on his food.  Eats a soft diet with small bites.  In the ER, respiratory status appeared to be improving.  CT chest abdomen pelvis revealed bibasilar infiltrates with concern for developing pneumonia.      SLP Plan   (FEES)      Recommendations for follow up therapy are one component of a multi-disciplinary discharge  planning process, led by the attending physician.  Recommendations may be updated based on patient status, additional functional criteria and insurance authorization.    Recommendations  Liquids provided via: Cup;Straw Medication Administration: Crushed with puree Supervision: Staff to assist with self feeding Compensations: Small sips/bites;Slow rate Postural Changes and/or Swallow Maneuvers: Seated upright 90 degrees;Upright 30-60 min after meal                  Oral care BID;Oral care prior to ice chip/H20     Dysphagia, oropharyngeal phase (R13.12)      (FEES)     Kerrie Pleasure, MA, CCC-SLP Acute Rehabilitation Services Office: [phone] 06/19/2023, 8:45 AM

## 2023-06-19 NOTE — Progress Notes (Signed)
 Physical Therapy Treatment Patient Details Name: Steven Guerrero MRN: [id number] DOB: Mar 09, 1941 Today's Date: 06/19/2023   History of Present Illness Pt is 83 yo male who presents on 06/15/23 with SOB, wheezing, and productive cough. CT concerning for PNA. Code sepsis called in ED. PMH: CVA, TBI 2003, PE, OSA not on CPAP, CKD3, GERD, COPD/ emphysema, chronic bronchitis, lung nodules, R THA    PT Comments  Patient progressing with activity tolerance and mobility this session.  Needing to have BM so placed on partial NRB mask at max (25L) on tank for transfer to [facility] with RW.  He dropped O2 sats with mobility as low at 80% but rebounded faster than last session up to 88 then 91%.  Remains weak and needing inpatient rehab (<3 hours/day) prior to d/c home.    If plan is discharge home, recommend the following: A lot of help with walking and/or transfers;A lot of help with bathing/dressing/bathroom;Assist for transportation;Assistance with cooking/housework   Can travel by private vehicle     No  Equipment Recommendations  None recommended by PT    Recommendations for Other Services       Precautions / Restrictions Precautions Precautions: Fall Recall of Precautions/Restrictions: Intact Precaution/Restrictions Comments: fell 2x in Feb; high O2 requirement     Mobility  Bed Mobility Overal bed mobility: Needs Assistance Bed Mobility: Supine to Sit, Sit to Supine     Supine to sit: HOB elevated, Used rails, Min assist Sit to supine: Min assist   General bed mobility comments: assist to scoot hips forward, then to supine assist for positioning    Transfers Overall transfer level: Needs assistance Equipment used: Rolling walker (2 wheels) Transfers: Sit to/from Stand, Bed to chair/wheelchair/BSC Sit to Stand: Mod assist   Step pivot transfers: Mod assist, Min assist       General transfer comment: some lifting help to stand; stepping to [facility] with RW and A for balance and to manage  lines/clothing, etc    Ambulation/Gait               General Gait Details: NT due to fatigue   Stairs             Wheelchair Mobility     Tilt Bed    Modified Rankin (Stroke Patients Only)       Balance Overall balance assessment: Needs assistance Sitting-balance support: Feet supported Sitting balance-Leahy Scale: Fair     Standing balance support: Bilateral upper extremity supported Standing balance-Leahy Scale: Poor Standing balance comment: UE support on RW while PT assisted with hygiene after toileting on [facility]                            Communication Communication Factors Affecting Communication: Reduced clarity of speech  Cognition Arousal: Alert Behavior During Therapy: WFL for tasks assessed/performed   PT - Cognitive impairments: Difficult to assess Difficult to assess due to: Impaired communication                     PT - Cognition Comments: dysarthric and wife answers most questions for him. Follows commands appropriately Following commands: Intact      Cueing Cueing Techniques: Verbal cues, Gestural cues  Exercises      General Comments General comments (skin integrity, edema, etc.): SpO2 range 80-91% during session, with mobility on Partial NRB at 25 LPM and at rest on HFNC at 15L; HR max 124  Pertinent Vitals/Pain Pain Assessment Pain Assessment: Faces Faces Pain Scale: Hurts a little bit Pain Location: sore from coughing (generalized) Pain Descriptors / Indicators: Sore Pain Intervention(s): Monitored during session    Home Living                          Prior Function            PT Goals (current goals can now be found in the care plan section) Progress towards PT goals: Progressing toward goals    Frequency    Min 2X/week      PT Plan      Co-evaluation              AM-PAC PT "6 Clicks" Mobility   Outcome Measure  Help needed turning from your back to your side  while in a flat bed without using bedrails?: A Little Help needed moving from lying on your back to sitting on the side of a flat bed without using bedrails?: A Little Help needed moving to and from a bed to a chair (including a wheelchair)?: A Little Help needed standing up from a chair using your arms (e.g., wheelchair or bedside chair)?: A Lot Help needed to walk in hospital room?: Total Help needed climbing 3-5 steps with a railing? : Total 6 Click Score: 13    End of Session Equipment Utilized During Treatment: Gait belt;Oxygen Activity Tolerance: Patient limited by fatigue Patient left: in bed;with call bell/phone within reach;with family/visitor present   PT Visit Diagnosis: Unsteadiness on feet (R26.81);Muscle weakness (generalized) (M62.81);History of falling (Z91.81);Difficulty in walking, not elsewhere classified (R26.2)     Time: 1610-9604 PT Time Calculation (min) (ACUTE ONLY): 42 min  Charges:    $Therapeutic Activity: 38-52 mins PT General Charges $$ ACUTE PT VISIT: 1 Visit                     Sheran Lawless, PT Acute Rehabilitation Services [phone] 06/19/2023    Steven Guerrero 06/19/2023, 4:52 PM

## 2023-06-19 NOTE — Consult Note (Signed)
 Consultation Note Date: 06/19/2023   Patient Name: Steven Guerrero  DOB: 06-22-1940  MRN: [id number]  Age / Sex: 83 y.o., male  PCP: Merri Brunette, MD Referring Physician: Maretta Bees, MD  Reason for Consultation: Establishing goals of care  HPI/Patient Profile: 83 y.o. male  with past medical history of VTE/PE on Xarelto, COPD, CVA, chronic dysphagia, OSA not on CPAP, CKD 3B, GERD, COPD/emphysema, chronic bronchitis, subcentimeter lung nodules admitted on 06/14/2023 with sudden onset shortness of breath, productive cough, and wheezing.   Patient was admitted with severe sepsis with acute hypoxic respiratory failure secondary to aspiration pneumonia. He remains dyspneic with minimal exertion and requiring HHFNC. PMT has been consulted to assist with goals of care conversation.  Clinical Assessment and Goals of Care:  I have reviewed medical records including EPIC notes, labs and imaging, assessed the patient and then met at the bedside with patient's wife and stepdaughter to discuss diagnosis prognosis, GOC, EOL wishes, disposition and options.  I introduced Palliative Medicine as specialized medical care for people living with serious illness. It focuses on providing relief from the symptoms and stress of a serious illness. The goal is to improve quality of life for both the patient and the family.  We discussed a brief life review of the patient and then focused on their current illness.   I attempted to elicit values and goals of care important to the patient.    Medical History Review and Understanding:  Patient and family report a good understanding of the severity of patient's illness after discussion with primary attending. They are glad that he has been weaned to [address] [place] today and seems to be tolerating it ok.  Social History: Patient lives with his wife and they are very well supported by his stepdaughter. Family feels they will need more help  at home if he can recover from this illness and be discharged. He has a stepson who is out of town for weeks at a time and a son who runs a business. They both try to help when they can. Patient previously enjoyed sports but has not been interested lately. He spends his time at home.   Functional and Nutritional State: Patient is unable to do much at all without getting winded.   Palliative Symptoms: Dyspnea, depression, irritability, fatigue   Discussion: Patient shares that he doesn't really know what to feel about his current situation. He acknowledges a great deal of emotional distress in general with many feelings. Emotional support was provided. He does not want to talk much about his hopes or goals of care today. His family shares that he was feeling more low yesterday and today is feeling better but more irritable and agitated. They all have a clear understanding that he remains at high risk for further decline and just discussed today's repeat CT results with the physician prior to my arrival. They are taking things one day at a time and are hoping that he can continue to wean from high oxygen requirements. Marian being able to actually do things and exert himself remains a big concern. They are open to ongoing goals of care discussions based on how he does over the next few days.   Discussed the importance of continued conversation with family and the medical providers regarding overall plan of care and treatment options, ensuring decisions are within the context of the patient's values and GOCs.   Questions and concerns were addressed.  Hard Choices booklet left for review. The family  was encouraged to call with questions or concerns.  PMT will continue to support holistically.   SUMMARY OF RECOMMENDATIONS   -Continue DNR/DNI -Continue current care for another few days and allow time for outcomes -Psychosocial and emotional support provided -PMT will continue to follow and  support  Prognosis:  Guarded to poor  Discharge Planning: To Be Determined      Primary Diagnoses: Present on Admission:  CAP (community acquired pneumonia)   Physical Exam Vitals and nursing note reviewed.  Constitutional:      General: He is not in acute distress.    Appearance: He is ill-appearing.     Comments: 15L [place]  Cardiovascular:     Rate and Rhythm: Normal rate.  Pulmonary:     Effort: Pulmonary effort is normal.  Neurological:     Mental Status: He is alert.  Psychiatric:        Behavior: Behavior is withdrawn.     Vital Signs: BP 134/69 (BP Location: Left Arm)   Pulse 86   Temp 98.8 F (37.1 C) (Oral)   Resp 20   SpO2 92%  Pain Scale: 0-10   Pain Score: 0-No pain   SpO2: SpO2: 92 % O2 Device:SpO2: 92 % O2 Flow Rate: .O2 Flow Rate (L/min): 14 L/min     Jacklin Zwick Jeni Salles, PA-C  Palliative Medicine Team Team phone # [phone]  Thank you for allowing the Palliative Medicine Team to assist in the care of this patient. Please utilize secure chat with additional questions, if there is no response within 30 minutes please call the above phone number.  Palliative Medicine Team providers are available by phone from 7am to 7pm daily and can be reached through the team cell phone.  Should this patient require assistance outside of these hours, please call the patient's attending physician.

## 2023-06-19 NOTE — Progress Notes (Signed)
 PROGRESS NOTE        PATIENT DETAILS Name: Steven Guerrero Age: 83 y.o. Sex: male Date of Birth: Dec 11, 1940 Admit Date: 06/14/2023 Admitting Physician Darlin Drop, DO RUE:AVWUJ, Zollie Beckers, MD  Brief Summary: Patient is a 83 y.o.  male with history of VTE on Xarelto, COPD, CVA, chronic dysphagia, OSA not on CPAP-who presented with shortness of breath/cough/wheezing and 1 episode of vomiting-patient was found to have acute hypoxic respiratory failure secondary pneumonia and subsequently admitted to the hospitalist service.  Significant events: 3/23>> admitted [facility]-SOB-presumed pneumonia.  Significant studies: 3/23>> CT chest/abdomen/pelvis: Developing infiltrates in lung bases.  Significant microbiology data: 3/23>> COVID/influenza/RSV PCR: Negative 3/23>> blood culture: No growth 3/25>> sputum culture:  pending.  Procedures: None  Consults: None  Subjective: Remains on heated high flow-Per spouse/patient-when he got out of bed to chair yesterday-he developed severe dyspnea with minimal exertion.  Objective: Vitals: Blood pressure (!) 151/76, pulse 85, temperature 98.6 F (37 C), temperature source Oral, resp. rate (!) 24, SpO2 93%.   Exam: Gen Exam:Alert awake-not in any distress HEENT:atraumatic, normocephalic Chest: B/L clear to auscultation anteriorly CVS:S1S2 regular Abdomen:soft non tender, non distended Extremities:no edema Neurology: Non focal Skin: no rash  Pertinent Labs/Radiology:    Latest Ref Rng & Units 06/19/2023    6:00 AM 06/15/2023    3:52 AM 06/15/2023    3:21 AM  CBC  WBC 4.0 - 10.5 K/uL 12.2   11.9   Hemoglobin 13.0 - 17.0 g/dL 81.1  91.4  78.2   Hematocrit 39.0 - 52.0 % 35.1  32.0  32.3   Platelets 150 - 400 K/uL 178   153     Lab Results  Component Value Date   NA 140 06/19/2023   K 3.8 06/19/2023   CL 103 06/19/2023   CO2 29 06/19/2023      Assessment/Plan: Severe sepsis with acute hypoxic respiratory  failure secondary to aspiration pneumonia Remains on heated high flow-has severe SOB with minimal exertion Volume status stable-do not think he requires IV Lasix today Will switch antibiotics to Zosyn/Zyvox Long discussion with spouse-obtain CT chest to reassess lung parenchyma-but spouse understands that if this trend continues-he will not likely survive this hospitalization.  He is currently comfortable-will observe him for another 1-2 days-if no improvement-we may be looking at the end-of-life situation. Await palliative care evaluation.   Continue to mobilize as much as possible-continue pulmonary toileting.  Dysphagia Chronic issue-evaluated by SLP-on dysphagia 3 diet.  History of VTE On Xarelto  CKD stage IIIb At baseline Follow electrolytes periodically  COPD Scheduled bronchodilators  OSA CPAP nightly  HLD Statin  HTN BP stable Continue amlodipine Resume losartan when able.  BPH Flomax  4.4 cm thoracic ascending aortic aneurysm Incidental finding on CT imaging. Radiology recommending annual CTA/MRA.  BMI: Estimated body mass index is 24.55 kg/m as calculated from the following:   Height as of 06/03/23: 5\' 11"  (1.803 m).   Weight as of 06/03/23: 79.8 kg.   Code status:   Code Status: Limited: Do not attempt resuscitation (DNR) -DNR-LIMITED -Do Not Intubate/DNI    DVT Prophylaxis: rivaroxaban (XARELTO) tablet 10 mg Start: 06/15/23 1000 rivaroxaban (XARELTO) tablet 10 mg    Family Communication: Spouse at bedside   Disposition Plan: Status is: Inpatient Remains inpatient appropriate because: Severity of illness   Planned Discharge Destination:Home   Diet: Diet Order  DIET DYS 3 Room service appropriate? Yes with Assist; Fluid consistency: Thin  Diet effective now                     Antimicrobial agents: Anti-infectives (From admission, onward)    Start     Dose/Rate Route Frequency Ordered Stop   06/19/23 1000  linezolid  (ZYVOX) IVPB 600 mg        600 mg 300 mL/hr over 60 Minutes Intravenous Every 12 hours 06/19/23 0854     06/19/23 0945  piperacillin-tazobactam (ZOSYN) IVPB 3.375 g        3.375 g 12.5 mL/hr over 240 Minutes Intravenous Every 8 hours 06/19/23 0854     06/17/23 1000  Ampicillin-Sulbactam (UNASYN) 3 g in sodium chloride 0.9 % 100 mL IVPB        3 g 200 mL/hr over 30 Minutes Intravenous Every 6 hours 06/17/23 0911     06/15/23 2230  azithromycin (ZITHROMAX) 500 mg in sodium chloride 0.9 % 250 mL IVPB  Status:  Discontinued        500 mg 250 mL/hr  Intravenous Every 24 hours 06/15/23 0410 06/17/23 0911   06/15/23 2200  cefTRIAXone (ROCEPHIN) 2 g in sodium chloride 0.9 % 100 mL IVPB  Status:  Discontinued        2 g 200 mL/hr over 30 Minutes Intravenous Every 24 hours 06/15/23 0410 06/17/23 0911   06/14/23 2330  cefTRIAXone (ROCEPHIN) 2 g in sodium chloride 0.9 % 100 mL IVPB        2 g 200 mL/hr over 30 Minutes Intravenous  Once 06/14/23 2326 06/15/23 0025   06/14/23 2330  azithromycin (ZITHROMAX) 500 mg in sodium chloride 0.9 % 250 mL IVPB        500 mg 250 mL/hr over 60 Minutes Intravenous  Once 06/14/23 2326 06/15/23 0056        MEDICATIONS: Scheduled Meds:  amLODipine  5 mg Oral Daily   arformoterol  15 mcg Nebulization BID   atorvastatin  20 mg Oral Daily   budesonide (PULMICORT) nebulizer solution  0.25 mg Nebulization BID   citalopram  20 mg Oral QHS   cyanocobalamin  1,000 mcg Oral Daily   multivitamin with minerals  1 tablet Oral Daily   pantoprazole  40 mg Oral Daily   revefenacin  175 mcg Nebulization Daily   rivaroxaban  10 mg Oral Daily   tamsulosin  0.4 mg Oral Daily   Continuous Infusions:  ampicillin-sulbactam (UNASYN) IV 3 g (06/19/23 0442)   linezolid (ZYVOX) IV     piperacillin-tazobactam (ZOSYN)  IV     PRN Meds:.acetaminophen, guaiFENesin-dextromethorphan, hydrALAZINE, levalbuterol, melatonin, menthol-cetylpyridinium, mouth rinse, phenol, polyethylene  glycol, prochlorperazine, simethicone   I have personally reviewed following labs and imaging studies  LABORATORY DATA: CBC: Recent Labs  Lab 06/14/23 2006 06/15/23 0321 06/15/23 0352 06/19/23 0600  WBC 10.1 11.9*  --  12.2*  NEUTROABS 7.9* 11.3*  --   --   HGB 12.9* 10.6* 10.9* 12.0*  HCT 39.0 32.3* 32.0* 35.1*  MCV 98.5 99.7  --  94.9  PLT 163 153  --  178    Basic Metabolic Panel: Recent Labs  Lab 06/15/23 0321 06/15/23 0352 06/16/23 0444 06/17/23 0431 06/18/23 0454 06/19/23 0600  NA 138 138 138 139 139 140  K 3.6 3.6 3.9 3.7 2.9* 3.8  CL 109  --  111 106 101 103  CO2 17*  --  22 23 26 29   GLUCOSE 192*  --  100* 94 108* 131*  BUN 22  --  19 18 18 17   CREATININE 1.49*  --  1.28* 1.42* 1.48* 1.63*  CALCIUM 8.1*  --  8.1* 8.1* 8.2* 8.1*  MG 1.6*  --  1.9  --   --   --   PHOS 3.0  --   --   --   --   --     GFR: CrCl cannot be calculated (Unknown ideal weight.).  Liver Function Tests: Recent Labs  Lab 06/14/23 2006 06/15/23 0321  AST 23 27  ALT 18 15  ALKPHOS 54 43  BILITOT 1.1 0.6  PROT 7.1 5.6*  ALBUMIN 3.7 2.8*   No results for input(s): "LIPASE", "AMYLASE" in the last 168 hours. No results for input(s): "AMMONIA" in the last 168 hours.  Coagulation Profile: Recent Labs  Lab 06/14/23 2006  INR 1.2    Cardiac Enzymes: No results for input(s): "CKTOTAL", "CKMB", "CKMBINDEX", "TROPONINI" in the last 168 hours.  BNP (last 3 results) No results for input(s): "PROBNP" in the last 8760 hours.  Lipid Profile: No results for input(s): "CHOL", "HDL", "LDLCALC", "TRIG", "CHOLHDL", "LDLDIRECT" in the last 72 hours.  Thyroid Function Tests: No results for input(s): "TSH", "T4TOTAL", "FREET4", "T3FREE", "THYROIDAB" in the last 72 hours.  Anemia Panel: No results for input(s): "VITAMINB12", "FOLATE", "FERRITIN", "TIBC", "IRON", "RETICCTPCT" in the last 72 hours.  Urine analysis:    Component Value Date/Time   COLORURINE YELLOW 06/15/2023 0309    APPEARANCEUR CLEAR 06/15/2023 0309   LABSPEC 1.010 06/15/2023 0309   PHURINE 5.5 06/15/2023 0309   GLUCOSEU NEGATIVE 06/15/2023 0309   HGBUR NEGATIVE 06/15/2023 0309   BILIRUBINUR NEGATIVE 06/15/2023 0309   KETONESUR NEGATIVE 06/15/2023 0309   PROTEINUR NEGATIVE 06/15/2023 0309   UROBILINOGEN 0.2 07/14/2009 1843   NITRITE NEGATIVE 06/15/2023 0309   LEUKOCYTESUR NEGATIVE 06/15/2023 0309    Sepsis Labs: Lactic Acid, Venous    Component Value Date/Time   LATICACIDVEN 0.9 06/17/2023 0431    MICROBIOLOGY: Recent Results (from the past 240 hours)  Blood Culture (routine x 2)     Status: None   Collection Time: 06/14/23  8:06 PM   Specimen: BLOOD  Result Value Ref Range Status   Specimen Description BLOOD RIGHT ARM  Final   Special Requests   Final    BOTTLES DRAWN AEROBIC AND ANAEROBIC Blood Culture adequate volume   Culture   Final    NO GROWTH 5 DAYS Performed at [facility], [address] [place], [place] [address]    Report Status 06/19/2023 FINAL  Final  Resp panel by RT-PCR (RSV, Flu A&B, Covid) Anterior Nasal Swab     Status: None   Collection Time: 06/14/23  8:27 PM   Specimen: Anterior Nasal Swab  Result Value Ref Range Status   SARS Coronavirus 2 by RT PCR NEGATIVE NEGATIVE Final   Influenza A by PCR NEGATIVE NEGATIVE Final   Influenza B by PCR NEGATIVE NEGATIVE Final    Comment: (NOTE) The Xpert Xpress SARS-CoV-2/FLU/RSV plus assay is intended as an aid in the diagnosis of influenza from Nasopharyngeal swab specimens and should not be used as a sole basis for treatment. Nasal washings and aspirates are unacceptable for Xpert Xpress SARS-CoV-2/FLU/RSV testing.  Fact Sheet for Patients: BloggerCourse.com  Fact Sheet for Healthcare Providers: SeriousBroker.it  This test is not yet approved or cleared by the [place] FDA and has been authorized for detection and/or diagnosis of SARS-CoV-2 by FDA  under an Emergency  Use Authorization (EUA). This EUA will remain in effect (meaning this test can be used) for the duration of the COVID-19 declaration under Section 564(b)(1) of the Act, 21 U.S.C. section 360bbb-3(b)(1), unless the authorization is terminated or revoked.     Resp Syncytial Virus by PCR NEGATIVE NEGATIVE Final    Comment: (NOTE) Fact Sheet for Patients: BloggerCourse.com  Fact Sheet for Healthcare Providers: SeriousBroker.it  This test is not yet approved or cleared by the [place] FDA and has been authorized for detection and/or diagnosis of SARS-CoV-2 by FDA under an Emergency Use Authorization (EUA). This EUA will remain in effect (meaning this test can be used) for the duration of the COVID-19 declaration under Section 564(b)(1) of the Act, 21 U.S.C. section 360bbb-3(b)(1), unless the authorization is terminated or revoked.  Performed at [facility], [address], [place], [place] [address]   Blood Culture (routine x 2)     Status: None   Collection Time: 06/14/23  8:30 PM   Specimen: BLOOD  Result Value Ref Range Status   Specimen Description BLOOD LEFT ARM  Final   Special Requests   Final    BOTTLES DRAWN AEROBIC AND ANAEROBIC Blood Culture adequate volume   Culture   Final    NO GROWTH 5 DAYS Performed at [facility], [address] [place], [place] [address]    Report Status 06/19/2023 FINAL  Final  Expectorated Sputum Assessment w Gram Stain, Rflx to Resp Cult     Status: None   Collection Time: 06/16/23  4:48 AM   Specimen: Expectorated Sputum  Result Value Ref Range Status   Specimen Description EXPECTORATED SPUTUM  Final   Special Requests NONE  Final   Sputum evaluation   Final    THIS SPECIMEN IS ACCEPTABLE FOR SPUTUM CULTURE Performed at [facility], [address] [place], [place] [address]    Report Status 06/16/2023 FINAL  Final  Culture, Respiratory w  Gram Stain     Status: None (Preliminary result)   Collection Time: 06/16/23  4:48 AM  Result Value Ref Range Status   Specimen Description EXPECTORATED SPUTUM  Final   Special Requests NONE Reflexed from [id number]  Final   Gram Stain   Final    FEW WBC PRESENT, PREDOMINANTLY PMN FEW GRAM NEGATIVE RODS FEW GRAM POSITIVE COCCI    Culture   Final    CULTURE REINCUBATED FOR BETTER GROWTH Performed at [facility], [address], [place], [place] [address]    Report Status PENDING  Incomplete    RADIOLOGY STUDIES/RESULTS: DG Chest Port 1 View Result Date: 06/19/2023 CLINICAL DATA:  141880 SOB (shortness of breath) [id number] EXAM: PORTABLE CHEST - 1 VIEW COMPARISON:  the previous day's study FINDINGS: Partial improvement in the patchy basilar opacities. Partially calcified pleural plaques bilaterally as before. Left retrocardiac atelectasis/consolidation stable. Heart size and mediastinal contours are within normal limits. Aortic Atherosclerosis (ICD10-170.0). No effusion. Visualized bones unremarkable. IMPRESSION: Partial improvement in patchy basilar opacities. Electronically Signed   By: Corlis Leak M.D.   On: 06/19/2023 08:35   DG Chest Port 1 View Result Date: 06/18/2023 CLINICAL DATA:  141880 SOB (shortness of breath) [id number] EXAM: PORTABLE CHEST 1 VIEW COMPARISON:  06/17/2023 FINDINGS: Stable heart size. Aortic atherosclerosis. Calcified pleural plaques are present bilaterally. Bibasilar atelectasis with improved aeration of the lung bases. No pleural effusion or pneumothorax. IMPRESSION: Bibasilar atelectasis with improved aeration of the lung bases. Electronically Signed   By: Janyth Pupa  Plundo D.O.   On: 06/18/2023 11:20     LOS: 4 days   Jeoffrey Massed, MD  [facility]    To contact the attending provider between 7A-7P or the covering provider during after hours 7P-7A, please log into the web site [email or web address] and access using universal [place] password for that web  site. If you do not have the password, please call the hospital operator.  06/19/2023, 10:22 AM

## 2023-06-19 NOTE — Procedures (Signed)
 Objective Swallowing Evaluation: Type of Study: Bedside Swallow Evaluation   Patient Details  Name: Steven Guerrero MRN: [id number] Date of Birth: Sep 29, 1940  Today's Date: 06/19/2023 Time: SLP Start Time (ACUTE ONLY): 0910 -SLP Stop Time (ACUTE ONLY): 0935  SLP Time Calculation (min) (ACUTE ONLY): 25 min   Past Medical History:  Past Medical History:  Diagnosis Date   Anxiety and depression    Back pain    Brain bleed (HCC)    2003   CVA (cerebral infarction)    2001   Hypercholesteremia    Hypertension    Peripheral neuropathy    Pulmonary embolism (HCC)    Sleep apnea    Stroke (HCC)    2001   TBI (traumatic brain injury) (HCC)    Past Surgical History:  Past Surgical History:  Procedure Laterality Date   HERNIA REPAIR     JOINT REPLACEMENT Right    hip replacement   LEG SURGERY     Broken femur   TOTAL HIP ARTHROPLASTY     Right   HPI: Steven Guerrero is a 83 y.o. male with medical history significant for previous CVA in 2001, TBI in 2003, history of PE on Xarelto, OSA not on CPAP, CKD 3B, GERD, COPD/emphysema, chronic bronchitis, subcentimeter lung nodules followed by pulmonary, presents to the ER from home via EMS with complaints of sudden onset shortness of breath, productive cough, and wheezing today.  Associated with 1 episode of vomiting.  EMS was activated.  Upon EMS assessment, the patient was found to be febrile with a Tmax 103.  Patient took Tylenol prior to arrival to the ER.  En route, he received albuterol nebs, Atrovent nebs, IV Solu-Medrol 125 mg x 1.  Per his wife he has had episodes of chocking on his food.  Eats a soft diet with small bites.  In the ER, respiratory status appeared to be improving.  CT chest abdomen pelvis revealed bibasilar infiltrates with concern for developing pneumonia.   Subjective: Awake, alert, pleasant. Wife and daughter present    Recommendations for follow up therapy are one component of a multi-disciplinary discharge planning  process, led by the attending physician.  Recommendations may be updated based on patient status, additional functional criteria and insurance authorization.  Assessment / Plan / Recommendation     06/19/2023   10:26 AM  Clinical Impressions  Clinical Impression Pt presents with grossly functional swallowing, marking an improvement in swallow function from MBSS completed on 06/16/26.  There was transient penetration of thin liquid dairy x1 over edge of aryepiglottic folds which cleared during subsequent trials.  There was possible trace, transient penetration during sequential straw sips x1 during sequential straw sips which was fully cleared during trial.  There was mild pharyngeal residue with solid and puree textures which cleared with additional swallows.  Pt with prominent epiglottic petiole, but anterior commisure of vocal folds was still visible with no static penetration observed at any point during evaluation.  View of trachea was very clear with multiple tracheal rings visible. Today's assessment is very reassuring.  Pt with known Zenker's diverticulum from prior MBSS but no backflow observed during this study.    Recommend mechanical soft solids with thin liquids.    SLP Visit Diagnosis --  Attention and concentration deficit following --  Frontal lobe and executive function deficit following --  Impact on safety and function Mild aspiration risk         06/19/2023   10:26 AM  Treatment Recommendations  Treatment Recommendations Therapy as outlined in treatment plan below        06/19/2023   10:31 AM  Prognosis  Prognosis for improved oropharyngeal function Good  Barriers to Reach Goals --  Barriers/Prognosis Comment --       06/19/2023   10:26 AM  Diet Recommendations  SLP Diet Recommendations Dysphagia 3 (Mech soft) solids;Thin liquid  Liquid Administration via Straw;Cup  Medication Administration Crushed with puree  Compensations Small sips/bites;Slow rate  Postural  Changes Remain semi-upright after after feeds/meals (Comment);Seated upright at 90 degrees         06/19/2023   10:26 AM  Other Recommendations  Recommended Consults --  Oral Care Recommendations Oral care BID  Caregiver Recommendations --  Follow Up Recommendations No SLP follow up  Assistance recommended at discharge --  Functional Status Assessment Patient has had a recent decline in their functional status and demonstrates the ability to make significant improvements in function in a reasonable and predictable amount of time.       06/19/2023   10:26 AM  Frequency and Duration   Speech Therapy Frequency (ACUTE ONLY) min 2x/week  Treatment Duration 2 weeks         06/19/2023   10:24 AM  Oral Phase  Oral Phase WFL  Oral - Pudding Teaspoon --  Oral - Pudding Cup --  Oral - Honey Teaspoon --  Oral - Honey Cup --  Oral - Nectar Teaspoon --  Oral - Nectar Cup --  Oral - Nectar Straw --  Oral - Thin Teaspoon --  Oral - Thin Cup --  Oral - Thin Straw --  Oral - Puree --  Oral - Mech Soft --  Oral - Regular --  Oral - Multi-Consistency --  Oral - Pill --  Oral Phase - Comment --       06/19/2023   10:24 AM  Pharyngeal Phase  Pharyngeal Phase Impaired  Pharyngeal- Pudding Teaspoon --  Pharyngeal --  Pharyngeal- Pudding Cup --  Pharyngeal --  Pharyngeal- Honey Teaspoon --  Pharyngeal --  Pharyngeal- Honey Cup --  Pharyngeal --  Pharyngeal- Nectar Teaspoon --  Pharyngeal --  Pharyngeal- Nectar Cup --  Pharyngeal --  Pharyngeal- Nectar Straw --  Pharyngeal --  Pharyngeal- Thin Teaspoon --  Pharyngeal --  Pharyngeal- Thin Cup --  Pharyngeal --  Pharyngeal- Thin Straw Penetration/Aspiration during swallow  Pharyngeal Material enters airway, remains ABOVE vocal cords then ejected out  Pharyngeal- Puree Pharyngeal residue - valleculae;Pharyngeal residue - pyriform  Pharyngeal Material does not enter airway  Pharyngeal- Mechanical Soft --  Pharyngeal --   Pharyngeal- Regular Pharyngeal residue - valleculae  Pharyngeal Material does not enter airway  Pharyngeal- Multi-consistency --  Pharyngeal --  Pharyngeal- Pill --  Pharyngeal --  Pharyngeal Comment --        06/19/2023   10:25 AM  Cervical Esophageal Phase   Cervical Esophageal Phase WFL  Pudding Teaspoon --  Pudding Cup --  Honey Teaspoon --  Honey Cup --  Nectar Teaspoon --  Nectar Cup --  Nectar Straw --  Thin Teaspoon --  Thin Cup --  Thin Straw --  Puree --  Mechanical Soft --  Regular --  Multi-consistency --  Pill --  Cervical Esophageal Comment --     Kerrie Pleasure, MA, CCC-SLP Acute Rehabilitation Services Office: [phone] 06/19/2023, 10:32 AM

## 2023-06-19 NOTE — Plan of Care (Signed)

## 2023-06-19 NOTE — Plan of Care (Signed)

## 2023-06-20 DIAGNOSIS — I159 Secondary hypertension, unspecified: Secondary | ICD-10-CM | POA: Diagnosis not present

## 2023-06-20 DIAGNOSIS — J9601 Acute respiratory failure with hypoxia: Secondary | ICD-10-CM | POA: Diagnosis not present

## 2023-06-20 DIAGNOSIS — J189 Pneumonia, unspecified organism: Secondary | ICD-10-CM | POA: Diagnosis not present

## 2023-06-20 DIAGNOSIS — A419 Sepsis, unspecified organism: Secondary | ICD-10-CM | POA: Diagnosis not present

## 2023-06-20 LAB — BASIC METABOLIC PANEL WITH GFR
Anion gap: 8 (ref 5–15)
BUN: 17 mg/dL (ref 8–23)
CO2: 29 mmol/L (ref 22–32)
Calcium: 8.3 mg/dL — ABNORMAL LOW (ref 8.9–10.3)
Chloride: 100 mmol/L (ref 98–111)
Creatinine, Ser: 1.62 mg/dL — ABNORMAL HIGH (ref 0.61–1.24)
GFR, Estimated: 42 mL/min — ABNORMAL LOW (ref >60)
Glucose, Bld: 131 mg/dL — ABNORMAL HIGH (ref 70–99)
Potassium: 3.7 mmol/L (ref 3.5–5.1)
Sodium: 137 mmol/L (ref 135–145)

## 2023-06-20 LAB — CBC
HCT: 32.4 % — ABNORMAL LOW (ref 39.0–52.0)
Hemoglobin: 11.1 g/dL — ABNORMAL LOW (ref 13.0–17.0)
MCH: 32.4 pg (ref 26.0–34.0)
MCHC: 34.3 g/dL (ref 30.0–36.0)
MCV: 94.5 fL (ref 80.0–100.0)
Platelets: 172 10*3/uL (ref 150–400)
RBC: 3.43 MIL/uL — ABNORMAL LOW (ref 4.22–5.81)
RDW: 13.2 % (ref 11.5–15.5)
WBC: 10.8 10*3/uL — ABNORMAL HIGH (ref 4.0–10.5)
nRBC: 0 % (ref 0.0–0.2)

## 2023-06-20 MED ORDER — ACETYLCYSTEINE 10 % IN SOLN
4.0000 mL | Freq: Three times a day (TID) | RESPIRATORY_TRACT | Status: DC
  Filled 2023-06-20 (×4): qty 4

## 2023-06-20 MED ORDER — IPRATROPIUM BROMIDE 0.02 % IN SOLN
0.5000 mg | Freq: Three times a day (TID) | RESPIRATORY_TRACT | Status: DC
  Administered 2023-06-20 (×2): 0.5 mg via RESPIRATORY_TRACT
  Filled 2023-06-20 (×2): qty 2.5

## 2023-06-20 MED ORDER — GUAIFENESIN ER 600 MG PO TB12
600.0000 mg | ORAL_TABLET | Freq: Two times a day (BID) | ORAL | Status: DC
  Administered 2023-06-20 – 2023-06-23 (×8): 600 mg via ORAL
  Filled 2023-06-20 (×9): qty 1

## 2023-06-20 MED ORDER — ACETYLCYSTEINE 20 % IN SOLN
4.0000 mL | Freq: Two times a day (BID) | RESPIRATORY_TRACT | Status: AC
  Administered 2023-06-20 (×2): 4 mL via RESPIRATORY_TRACT
  Filled 2023-06-20 (×2): qty 4

## 2023-06-20 MED ORDER — LEVALBUTEROL HCL 0.63 MG/3ML IN NEBU
0.6300 mg | INHALATION_SOLUTION | Freq: Three times a day (TID) | RESPIRATORY_TRACT | Status: DC
  Administered 2023-06-20 (×2): 0.63 mg via RESPIRATORY_TRACT
  Filled 2023-06-20 (×2): qty 3

## 2023-06-20 NOTE — Plan of Care (Signed)

## 2023-06-20 NOTE — Progress Notes (Signed)
 PROGRESS NOTE        PATIENT DETAILS Name: Steven Guerrero Age: 83 y.o. Sex: male Date of Birth: 25-Dec-1940 Admit Date: 06/14/2023 Admitting Physician Darlin Drop, DO ZOX:[id number], Zollie Beckers, MD  Brief Summary: Patient is a 83 y.o.  male with history of VTE on Xarelto, COPD, CVA, chronic dysphagia, OSA not on CPAP-who presented with shortness of breath/cough/wheezing and 1 episode of vomiting-patient was found to have acute hypoxic respiratory failure secondary pneumonia and subsequently admitted to the hospitalist service.  Significant events: 3/23>> admitted [facility]-SOB-presumed pneumonia.  Significant studies: 3/23>> CT chest/abdomen/pelvis: Developing infiltrates in lung bases. 3/28>> CT chest: Complete middle lobe collapse-bibasilar consolidation.  Significant microbiology data: 3/23>> COVID/influenza/RSV PCR: Negative 3/23>> blood culture: No growth 3/25>> sputum culture: Normal respiratory flora  Procedures: None  Consults: None  Subjective: Lying comfortably in bed-briefly tolerated salter high flow but desaturated and placed back on heated high flow.  No other issues overnight.  Spouse at bedside.  Objective: Vitals: Blood pressure 104/65, pulse 77, temperature 97.8 F (36.6 C), temperature source Oral, resp. rate (!) 24, height 5\' 11"  (1.803 m), weight 77.6 kg, SpO2 95%.   Exam: Gen Exam:Alert awake-not in any distress HEENT:atraumatic, normocephalic Chest: B/L clear to auscultation anteriorly CVS:S1S2 regular Abdomen:soft non tender, non distended Extremities:no edema Neurology: Non focal Skin: no rash  Pertinent Labs/Radiology:    Latest Ref Rng & Units 06/20/2023    5:00 AM 06/19/2023    6:00 AM 06/15/2023    3:52 AM  CBC  WBC 4.0 - 10.5 K/uL 10.8  12.2    Hemoglobin 13.0 - 17.0 g/dL 54.0  98.1  19.1   Hematocrit 39.0 - 52.0 % 32.4  35.1  32.0   Platelets 150 - 400 K/uL 172  178      Lab Results  Component Value Date   NA 137  06/20/2023   K 3.7 06/20/2023   CL 100 06/20/2023   CO2 29 06/20/2023      Assessment/Plan: Severe sepsis with acute hypoxic respiratory failure secondary to aspiration pneumonia Did not tolerate salter high flow yesterday-back on heated high flow Volume status stable-does not require Lasix today Will continue Zyvox/Zosyn for now as he appears very tenuous Given that he has middle lobe collapse-will add Mucomyst nebs/scheduled bronchodilators for a day-encourage use of incentive spirometry/flutter valve-[place] how he does At this point he remains on maximal medical treatment-with no further role for any escalation in care-will observe for another few days-if no significant improvement-May need to consider that he may be at end-of-life-and initiate hospice measures.  Spouse/family aware.  Palliative consult pending.  Dysphagia Chronic issue-evaluated by SLP-on dysphagia 3 diet.  History of VTE On Xarelto  CKD stage IIIb At baseline Follow electrolytes periodically  COPD Scheduled bronchodilators  OSA CPAP nightly  HLD Statin  HTN BP stable Continue amlodipine Resume losartan when able.  BPH Flomax  4.4 cm thoracic ascending aortic aneurysm Incidental finding on CT imaging. Radiology recommending annual CTA/MRA.  BMI: Estimated body mass index is 23.86 kg/m as calculated from the following:   Height as of this encounter: 5\' 11"  (1.803 m).   Weight as of this encounter: 77.6 kg.   Code status:   Code Status: Limited: Do not attempt resuscitation (DNR) -DNR-LIMITED -Do Not Intubate/DNI    DVT Prophylaxis: rivaroxaban (XARELTO) tablet 10 mg Start: 06/15/23 1000 rivaroxaban (XARELTO)  tablet 10 mg    Family Communication: Spouse at bedside   Disposition Plan: Status is: Inpatient Remains inpatient appropriate because: Severity of illness   Planned Discharge Destination:Home   Diet: Diet Order             DIET DYS 3 Room service appropriate? Yes with  Assist; Fluid consistency: Thin  Diet effective now                     Antimicrobial agents: Anti-infectives (From admission, onward)    Start     Dose/Rate Route Frequency Ordered Stop   06/19/23 1000  linezolid (ZYVOX) IVPB 600 mg        600 mg 300 mL/hr over 60 Minutes Intravenous Every 12 hours 06/19/23 0854     06/19/23 0945  piperacillin-tazobactam (ZOSYN) IVPB 3.375 g        3.375 g 12.5 mL/hr over 240 Minutes Intravenous Every 8 hours 06/19/23 0854     06/17/23 1000  Ampicillin-Sulbactam (UNASYN) 3 g in sodium chloride 0.9 % 100 mL IVPB  Status:  Discontinued        3 g 200 mL/hr over 30 Minutes Intravenous Every 6 hours 06/17/23 0911 06/19/23 1025   06/15/23 2230  azithromycin (ZITHROMAX) 500 mg in sodium chloride 0.9 % 250 mL IVPB  Status:  Discontinued        500 mg 250 mL/hr  Intravenous Every 24 hours 06/15/23 0410 06/17/23 0911   06/15/23 2200  cefTRIAXone (ROCEPHIN) 2 g in sodium chloride 0.9 % 100 mL IVPB  Status:  Discontinued        2 g 200 mL/hr over 30 Minutes Intravenous Every 24 hours 06/15/23 0410 06/17/23 0911   06/14/23 2330  cefTRIAXone (ROCEPHIN) 2 g in sodium chloride 0.9 % 100 mL IVPB        2 g 200 mL/hr over 30 Minutes Intravenous  Once 06/14/23 2326 06/15/23 0025   06/14/23 2330  azithromycin (ZITHROMAX) 500 mg in sodium chloride 0.9 % 250 mL IVPB        500 mg 250 mL/hr over 60 Minutes Intravenous  Once 06/14/23 2326 06/15/23 0056        MEDICATIONS: Scheduled Meds:  amLODipine  5 mg Oral Daily   arformoterol  15 mcg Nebulization BID   atorvastatin  20 mg Oral Daily   budesonide (PULMICORT) nebulizer solution  0.25 mg Nebulization BID   citalopram  20 mg Oral QHS   cyanocobalamin  1,000 mcg Oral Daily   guaiFENesin  600 mg Oral BID   multivitamin with minerals  1 tablet Oral Daily   pantoprazole  40 mg Oral Daily   revefenacin  175 mcg Nebulization Daily   rivaroxaban  10 mg Oral Daily   tamsulosin  0.4 mg Oral Daily    Continuous Infusions:  linezolid (ZYVOX) IV 600 mg (06/20/23 0933)   piperacillin-tazobactam (ZOSYN)  IV 3.375 g (06/20/23 0931)   PRN Meds:.acetaminophen, hydrALAZINE, levalbuterol, melatonin, menthol-cetylpyridinium, mouth rinse, phenol, polyethylene glycol, prochlorperazine, simethicone   I have personally reviewed following labs and imaging studies  LABORATORY DATA: CBC: Recent Labs  Lab 06/14/23 2006 06/15/23 0321 06/15/23 0352 06/19/23 0600 06/20/23 0500  WBC 10.1 11.9*  --  12.2* 10.8*  NEUTROABS 7.9* 11.3*  --   --   --   HGB 12.9* 10.6* 10.9* 12.0* 11.1*  HCT 39.0 32.3* 32.0* 35.1* 32.4*  MCV 98.5 99.7  --  94.9 94.5  PLT 163 153  --  178  172    Basic Metabolic Panel: Recent Labs  Lab 06/15/23 0321 06/15/23 0352 06/16/23 0444 06/17/23 0431 06/18/23 0454 06/19/23 0600 06/20/23 0500  NA 138   < > 138 139 139 140 137  K 3.6   < > 3.9 3.7 2.9* 3.8 3.7  CL 109  --  111 106 101 103 100  CO2 17*  --  22 23 26 29 29   GLUCOSE 192*  --  100* 94 108* 131* 131*  BUN 22  --  19 18 18 17 17   CREATININE 1.49*  --  1.28* 1.42* 1.48* 1.63* 1.62*  CALCIUM 8.1*  --  8.1* 8.1* 8.2* 8.1* 8.3*  MG 1.6*  --  1.9  --   --   --   --   PHOS 3.0  --   --   --   --   --   --    < > = values in this interval not displayed.    GFR: Estimated Creatinine Clearance: 36.8 mL/min (A) (by C-G formula based on SCr of 1.62 mg/dL (H)).  Liver Function Tests: Recent Labs  Lab 06/14/23 2006 06/15/23 0321  AST 23 27  ALT 18 15  ALKPHOS 54 43  BILITOT 1.1 0.6  PROT 7.1 5.6*  ALBUMIN 3.7 2.8*   No results for input(s): "LIPASE", "AMYLASE" in the last 168 hours. No results for input(s): "AMMONIA" in the last 168 hours.  Coagulation Profile: Recent Labs  Lab 06/14/23 2006  INR 1.2    Cardiac Enzymes: No results for input(s): "CKTOTAL", "CKMB", "CKMBINDEX", "TROPONINI" in the last 168 hours.  BNP (last 3 results) No results for input(s): "PROBNP" in the last 8760  hours.  Lipid Profile: No results for input(s): "CHOL", "HDL", "LDLCALC", "TRIG", "CHOLHDL", "LDLDIRECT" in the last 72 hours.  Thyroid Function Tests: No results for input(s): "TSH", "T4TOTAL", "FREET4", "T3FREE", "THYROIDAB" in the last 72 hours.  Anemia Panel: No results for input(s): "VITAMINB12", "FOLATE", "FERRITIN", "TIBC", "IRON", "RETICCTPCT" in the last 72 hours.  Urine analysis:    Component Value Date/Time   COLORURINE YELLOW 06/15/2023 0309   APPEARANCEUR CLEAR 06/15/2023 0309   LABSPEC 1.010 06/15/2023 0309   PHURINE 5.5 06/15/2023 0309   GLUCOSEU NEGATIVE 06/15/2023 0309   HGBUR NEGATIVE 06/15/2023 0309   BILIRUBINUR NEGATIVE 06/15/2023 0309   KETONESUR NEGATIVE 06/15/2023 0309   PROTEINUR NEGATIVE 06/15/2023 0309   UROBILINOGEN 0.2 07/14/2009 1843   NITRITE NEGATIVE 06/15/2023 0309   LEUKOCYTESUR NEGATIVE 06/15/2023 0309    Sepsis Labs: Lactic Acid, Venous    Component Value Date/Time   LATICACIDVEN 0.9 06/17/2023 0431    MICROBIOLOGY: Recent Results (from the past 240 hours)  Blood Culture (routine x 2)     Status: None   Collection Time: 06/14/23  8:06 PM   Specimen: BLOOD  Result Value Ref Range Status   Specimen Description BLOOD RIGHT ARM  Final   Special Requests   Final    BOTTLES DRAWN AEROBIC AND ANAEROBIC Blood Culture adequate volume   Culture   Final    NO GROWTH 5 DAYS Performed at [facility], [address] [place], [place] [address]    Report Status 06/19/2023 FINAL  Final  Resp panel by RT-PCR (RSV, Flu A&B, Covid) Anterior Nasal Swab     Status: None   Collection Time: 06/14/23  8:27 PM   Specimen: Anterior Nasal Swab  Result Value Ref Range Status   SARS Coronavirus 2 by RT PCR NEGATIVE NEGATIVE Final  Influenza A by PCR NEGATIVE NEGATIVE Final   Influenza B by PCR NEGATIVE NEGATIVE Final    Comment: (NOTE) The Xpert Xpress SARS-CoV-2/FLU/RSV plus assay is intended as an aid in the diagnosis of influenza from  Nasopharyngeal swab specimens and should not be used as a sole basis for treatment. Nasal washings and aspirates are unacceptable for Xpert Xpress SARS-CoV-2/FLU/RSV testing.  Fact Sheet for Patients: BloggerCourse.com  Fact Sheet for Healthcare Providers: SeriousBroker.it  This test is not yet approved or cleared by the [place] FDA and has been authorized for detection and/or diagnosis of SARS-CoV-2 by FDA under an Emergency Use Authorization (EUA). This EUA will remain in effect (meaning this test can be used) for the duration of the COVID-19 declaration under Section 564(b)(1) of the Act, 21 U.S.C. section 360bbb-3(b)(1), unless the authorization is terminated or revoked.     Resp Syncytial Virus by PCR NEGATIVE NEGATIVE Final    Comment: (NOTE) Fact Sheet for Patients: BloggerCourse.com  Fact Sheet for Healthcare Providers: SeriousBroker.it  This test is not yet approved or cleared by the [place] FDA and has been authorized for detection and/or diagnosis of SARS-CoV-2 by FDA under an Emergency Use Authorization (EUA). This EUA will remain in effect (meaning this test can be used) for the duration of the COVID-19 declaration under Section 564(b)(1) of the Act, 21 U.S.C. section 360bbb-3(b)(1), unless the authorization is terminated or revoked.  Performed at [facility], [address], [place], [place] [address]   Blood Culture (routine x 2)     Status: None   Collection Time: 06/14/23  8:30 PM   Specimen: BLOOD  Result Value Ref Range Status   Specimen Description BLOOD LEFT ARM  Final   Special Requests   Final    BOTTLES DRAWN AEROBIC AND ANAEROBIC Blood Culture adequate volume   Culture   Final    NO GROWTH 5 DAYS Performed at [facility], [address] [place], [place] [address]    Report Status 06/19/2023 FINAL  Final  Expectorated  Sputum Assessment w Gram Stain, Rflx to Resp Cult     Status: None   Collection Time: 06/16/23  4:48 AM   Specimen: Expectorated Sputum  Result Value Ref Range Status   Specimen Description EXPECTORATED SPUTUM  Final   Special Requests NONE  Final   Sputum evaluation   Final    THIS SPECIMEN IS ACCEPTABLE FOR SPUTUM CULTURE Performed at [facility], [address], [place], [place] [address]    Report Status 06/16/2023 FINAL  Final  Culture, Respiratory w Gram Stain     Status: None   Collection Time: 06/16/23  4:48 AM  Result Value Ref Range Status   Specimen Description EXPECTORATED SPUTUM  Final   Special Requests NONE Reflexed from [id number]  Final   Gram Stain   Final    FEW WBC PRESENT, PREDOMINANTLY PMN FEW GRAM NEGATIVE RODS FEW GRAM POSITIVE COCCI    Culture   Final    MODERATE Normal respiratory flora-no Staph aureus or Pseudomonas seen Performed at [facility], [address] [place], [place] [address]    Report Status 06/19/2023 FINAL  Final    RADIOLOGY STUDIES/RESULTS: CT CHEST WO CONTRAST Result Date: 06/19/2023 CLINICAL DATA:  Pneumonia, complications suspected. Shortness of breath. EXAM: CT CHEST WITHOUT CONTRAST TECHNIQUE: Multidetector CT imaging of the chest was performed following the standard protocol without IV contrast. RADIATION DOSE REDUCTION: This exam was performed according to  the departmental dose-optimization program which includes automated exposure control, adjustment of the mA and/or kV according to patient size and/or use of iterative reconstruction technique. COMPARISON:  Radiographs 06/19/2023, 06/18/2023 and earlier. Chest CT 06/14/2023 and 12/04/2020. FINDINGS: Cardiovascular: Again demonstrated is extensive atherosclerosis of the aorta, great vessels and coronary arteries. Dilatation of the ascending aorta is suboptimally evaluated due to motion artifact, but grossly stable, measuring approximately 4.2 cm. No acute vascular findings on  noncontrast imaging. The heart size is normal. There is no pericardial effusion. Mediastinum/Nodes: There are no enlarged mediastinal, hilar or axillary lymph nodes.Small mediastinal lymph nodes appear unchanged. The thyroid gland, trachea and esophagus demonstrate no significant findings. Lungs/Pleura: No pneumothorax or significant pleural effusion. Fairly extensive calcified pleural plaques are again noted bilaterally. Underlying moderate centrilobular emphysema with diffuse central airway thickening. There is new complete right middle lobe collapse. New dependent consolidation within both lower lobes, suspicious for pneumonia. Upper abdomen: No acute findings are seen in the visualized upper abdomen. Small gallstones are again noted. Grossly stable renal cystic lesions bilaterally for which no specific follow-up imaging is recommended. Extensive vascular calcifications noted. Musculoskeletal/Chest wall: There is no chest wall mass or suspicious osseous finding. Old rib fractures and mild spondylosis noted. IMPRESSION: 1. New complete right middle lobe collapse and dependent consolidation within both lower lobes, suspicious for multilobar pneumonia. Radiographic follow-up recommended to ensure resolution. 2. Stable calcified pleural plaques bilaterally, consistent with asbestos related pleural disease. 3. Grossly stable dilatation of the ascending aorta. 4. Aortic Atherosclerosis (ICD10-I70.0) and Emphysema (ICD10-J43.9). Electronically Signed   By: Carey Bullocks M.D.   On: 06/19/2023 16:34   DG Chest Port 1 View Result Date: 06/19/2023 CLINICAL DATA:  [id number] SOB (shortness of breath) [id number] EXAM: PORTABLE CHEST - 1 VIEW COMPARISON:  the previous day's study FINDINGS: Partial improvement in the patchy basilar opacities. Partially calcified pleural plaques bilaterally as before. Left retrocardiac atelectasis/consolidation stable. Heart size and mediastinal contours are within normal limits. Aortic  Atherosclerosis (ICD10-170.0). No effusion. Visualized bones unremarkable. IMPRESSION: Partial improvement in patchy basilar opacities. Electronically Signed   By: Corlis Leak M.D.   On: 06/19/2023 08:35     LOS: 5 days   Jeoffrey Massed, MD  [facility]    To contact the attending provider between 7A-7P or the covering provider during after hours 7P-7A, please log into the web site [email or web address] and access using universal [place] password for that web site. If you do not have the password, please call the hospital operator.  06/20/2023, 10:47 AM

## 2023-06-20 NOTE — Progress Notes (Signed)
 Daily Progress Note   Patient Name: Steven Guerrero       Date: 06/20/2023 DOB: 1940-04-02  Age: 83 y.o. MRN#: [id number] Attending Physician: Maretta Bees, MD Primary Care Physician: Merri Brunette, MD Admit Date: 06/14/2023  Reason for Consultation/Follow-up: Establishing goals of care  Subjective: Medical records reviewed including progress notes, labs and imaging. Patient assessed at the bedside. He and his wife were both sleeping and I elected to not disturb then.   Returned to the bedside in the afternoon for continued GOC discussions. Lucila Maine was also present. Patient reports he is still feeling "so-so" about his current situation. He denies any questions, concerns, or need for conversation about next steps. Wife shares that she is familiar with both palliative care and hospice from previous experience with her stepfather. She understands what to expect with both resources depending on how Byron does. We reviewed treatment plan today based on yesterday's finding of partial lung collapse. Unfortunately, Kade is having a hard time with the IS today.   Questions and concerns addressed. PMT will continue to support holistically.   Length of Stay: 5   Physical Exam Vitals and nursing note reviewed.  Constitutional:      General: He is not in acute distress.    Appearance: He is ill-appearing.     Comments: HHFNC 50L   Cardiovascular:     Rate and Rhythm: Normal rate.  Pulmonary:     Effort: Pulmonary effort is normal.  Neurological:     Mental Status: He is alert.            Vital Signs: BP 104/65 (BP Location: Left Arm)   Pulse 77   Temp 97.8 F (36.6 C) (Oral)   Resp (!) 24   Ht 5\' 11"  (1.803 m)   Wt 77.6 kg   SpO2 95%   BMI 23.86 kg/m  SpO2: SpO2: 95 % O2 Device: O2 Device: Heated High Flow Nasal Cannula O2 Flow  Rate: O2 Flow Rate (L/min): 50 L/min      Palliative Assessment/Data: 40% at best   Palliative Care Assessment & Plan   Patient Profile: 83 y.o. male  with past medical history of VTE/PE on Xarelto, COPD, CVA, chronic dysphagia, OSA not on CPAP, CKD 3B, GERD, COPD/emphysema, chronic bronchitis, subcentimeter lung nodules admitted on 06/14/2023 with sudden onset shortness of breath, productive cough, and wheezing.    Patient was admitted with severe sepsis with acute hypoxic respiratory failure secondary to aspiration pneumonia. He remains dyspneic with minimal exertion and requiring HHFNC. PMT has been consulted to assist with goals of care conversation.  Assessment: Goals  of care conversation Sepsis with acute hypoxic respiratory failure secondary to aspiration pneumonia  Recommendations/Plan: Continue DNR/DNI Continue current care plan and allow more time for outcomes.  Family is aware of concerning prognosis and potential need for hospice if he does not improve Psychosocial and emotional for provided PMT will continue to follow   Prognosis: High risk for further decline  Discharge Planning: To Be Determined  Care plan was discussed with patient, patient's wife, grandson           Rico Ala, [place]  Palliative Medicine Team Team phone # [phone]  Thank you for allowing the Palliative Medicine Team to assist in the care of this patient. Please utilize secure chat with additional questions, if there is no response within 30 minutes please call the above phone number.  Palliative Medicine Team providers are available by phone from 7am to 7pm daily and can be reached through the team cell phone.  Should this patient require assistance outside of these hours, please call the patient's attending physician.

## 2023-06-20 NOTE — Plan of Care (Signed)
  Problem: Education: Goal: Knowledge of General Education information will improve Description: Including pain rating scale, medication(s)/side effects and non-pharmacologic comfort measures Outcome: Progressing   Problem: Health Behavior/Discharge Planning: Goal: Ability to manage health-related needs will improve Outcome: Progressing   Problem: Clinical Measurements: Goal: Ability to maintain clinical measurements within normal limits will improve Outcome: Progressing Goal: Will remain free from infection Outcome: Progressing Goal: Respiratory complications will improve Outcome: Progressing   Problem: Activity: Goal: Risk for activity intolerance will decrease Outcome: Progressing   Problem: Coping: Goal: Level of anxiety will decrease Outcome: Progressing   

## 2023-06-20 NOTE — Plan of Care (Signed)
  Problem: Education: Goal: Knowledge of General Education information will improve Description: Including pain rating scale, medication(s)/side effects and non-pharmacologic comfort measures Outcome: Progressing   Problem: Health Behavior/Discharge Planning: Goal: Ability to manage health-related needs will improve Outcome: Progressing   Problem: Clinical Measurements: Goal: Ability to maintain clinical measurements within normal limits will improve Outcome: Progressing Goal: Respiratory complications will improve Outcome: Progressing   Problem: Activity: Goal: Risk for activity intolerance will decrease Outcome: Progressing   Problem: Nutrition: Goal: Adequate nutrition will be maintained Outcome: Progressing   Problem: Coping: Goal: Level of anxiety will decrease Outcome: Progressing   Problem: Elimination: Goal: Will not experience complications related to bowel motility Outcome: Progressing   Problem: Safety: Goal: Ability to remain free from injury will improve Outcome: Progressing

## 2023-06-21 ENCOUNTER — Inpatient Hospital Stay (HOSPITAL_COMMUNITY)

## 2023-06-21 DIAGNOSIS — J9601 Acute respiratory failure with hypoxia: Secondary | ICD-10-CM | POA: Diagnosis not present

## 2023-06-21 DIAGNOSIS — Z515 Encounter for palliative care: Secondary | ICD-10-CM | POA: Diagnosis not present

## 2023-06-21 DIAGNOSIS — I159 Secondary hypertension, unspecified: Secondary | ICD-10-CM | POA: Diagnosis not present

## 2023-06-21 DIAGNOSIS — J189 Pneumonia, unspecified organism: Secondary | ICD-10-CM | POA: Diagnosis not present

## 2023-06-21 DIAGNOSIS — A419 Sepsis, unspecified organism: Secondary | ICD-10-CM | POA: Diagnosis not present

## 2023-06-21 DIAGNOSIS — Z7189 Other specified counseling: Secondary | ICD-10-CM | POA: Diagnosis not present

## 2023-06-21 LAB — COMPREHENSIVE METABOLIC PANEL WITH GFR
ALT: 68 U/L — ABNORMAL HIGH (ref 0–44)
AST: 68 U/L — ABNORMAL HIGH (ref 15–41)
Albumin: 2.3 g/dL — ABNORMAL LOW (ref 3.5–5.0)
Alkaline Phosphatase: 54 U/L (ref 38–126)
Anion gap: 10 (ref 5–15)
BUN: 14 mg/dL (ref 8–23)
CO2: 26 mmol/L (ref 22–32)
Calcium: 8.1 mg/dL — ABNORMAL LOW (ref 8.9–10.3)
Chloride: 99 mmol/L (ref 98–111)
Creatinine, Ser: 1.52 mg/dL — ABNORMAL HIGH (ref 0.61–1.24)
GFR, Estimated: 45 mL/min — ABNORMAL LOW
Glucose, Bld: 84 mg/dL (ref 70–99)
Potassium: 3.2 mmol/L — ABNORMAL LOW (ref 3.5–5.1)
Sodium: 135 mmol/L (ref 135–145)
Total Bilirubin: 1.1 mg/dL (ref 0.0–1.2)
Total Protein: 5.8 g/dL — ABNORMAL LOW (ref 6.5–8.1)

## 2023-06-21 LAB — CBC
HCT: 30.3 % — ABNORMAL LOW (ref 39.0–52.0)
Hemoglobin: 10.3 g/dL — ABNORMAL LOW (ref 13.0–17.0)
MCH: 32.1 pg (ref 26.0–34.0)
MCHC: 34 g/dL (ref 30.0–36.0)
MCV: 94.4 fL (ref 80.0–100.0)
Platelets: 181 10*3/uL (ref 150–400)
RBC: 3.21 MIL/uL — ABNORMAL LOW (ref 4.22–5.81)
RDW: 12.8 % (ref 11.5–15.5)
WBC: 7.4 10*3/uL (ref 4.0–10.5)
nRBC: 0 % (ref 0.0–0.2)

## 2023-06-21 MED ORDER — IPRATROPIUM BROMIDE 0.02 % IN SOLN
0.5000 mg | Freq: Three times a day (TID) | RESPIRATORY_TRACT | Status: DC
  Administered 2023-06-21 (×2): 0.5 mg via RESPIRATORY_TRACT
  Filled 2023-06-21 (×2): qty 2.5

## 2023-06-21 MED ORDER — ENSURE ENLIVE PO LIQD
237.0000 mL | Freq: Two times a day (BID) | ORAL | Status: DC
Start: 2023-06-21 — End: 2023-06-21
  Administered 2023-06-21: 237 mL via ORAL

## 2023-06-21 MED ORDER — LEVALBUTEROL HCL 0.63 MG/3ML IN NEBU
0.6300 mg | INHALATION_SOLUTION | Freq: Three times a day (TID) | RESPIRATORY_TRACT | Status: DC
  Administered 2023-06-21 (×2): 0.63 mg via RESPIRATORY_TRACT
  Filled 2023-06-21 (×2): qty 3

## 2023-06-21 MED ORDER — POTASSIUM CHLORIDE 20 MEQ PO PACK
40.0000 meq | PACK | Freq: Once | ORAL | Status: AC
  Administered 2023-06-21: 40 meq via ORAL
  Filled 2023-06-21: qty 2

## 2023-06-21 MED ORDER — LEVALBUTEROL HCL 0.63 MG/3ML IN NEBU
0.6300 mg | INHALATION_SOLUTION | Freq: Two times a day (BID) | RESPIRATORY_TRACT | Status: DC
  Administered 2023-06-21: 0.63 mg via RESPIRATORY_TRACT
  Filled 2023-06-21: qty 3

## 2023-06-21 MED ORDER — IPRATROPIUM BROMIDE 0.02 % IN SOLN
0.5000 mg | Freq: Three times a day (TID) | RESPIRATORY_TRACT | Status: DC
  Administered 2023-06-21: 0.5 mg via RESPIRATORY_TRACT
  Filled 2023-06-21: qty 2.5

## 2023-06-21 NOTE — Progress Notes (Signed)
 PROGRESS NOTE        PATIENT DETAILS Name: Steven Guerrero Age: 83 y.o. Sex: male Date of Birth: Dec 30, 1940 Admit Date: 06/14/2023 Admitting Physician Darlin Drop, DO WUJ:[id number], Zollie Beckers, MD  Brief Summary: Patient is a 83 y.o.  male with history of VTE on Xarelto, COPD, CVA, chronic dysphagia, OSA not on CPAP-who presented with shortness of breath/cough/wheezing and 1 episode of vomiting-patient was found to have acute hypoxic respiratory failure secondary pneumonia and subsequently admitted to the hospitalist service.  Significant events: 3/23>> admitted [facility]-SOB-presumed pneumonia.  Significant studies: 3/23>> CT chest/abdomen/pelvis: Developing infiltrates in lung bases. 3/28>> CT chest: Complete middle lobe collapse-bibasilar consolidation.  Significant microbiology data: 3/23>> COVID/influenza/RSV PCR: Negative 3/23>> blood culture: No growth 3/25>> sputum culture: Normal respiratory flora  Procedures: None  Consults: Palliative care  Subjective: No major issues overnight-still gets short of breath with minimal activity-switched from heated high flow to salter high flow earlier this morning.  Objective: Vitals: Blood pressure 128/63, pulse 78, temperature 98.6 F (37 C), temperature source Oral, resp. rate 19, height 5\' 11"  (1.803 m), weight 77.6 kg, SpO2 93%.   Exam: Gen Exam:Alert awake-not in any distress HEENT:atraumatic, normocephalic Chest: B/L clear to auscultation anteriorly CVS:S1S2 regular Abdomen:soft non tender, non distended Extremities:no edema Neurology: Non focal Skin: no rash  Pertinent Labs/Radiology:    Latest Ref Rng & Units 06/21/2023    5:52 AM 06/20/2023    5:00 AM 06/19/2023    6:00 AM  CBC  WBC 4.0 - 10.5 K/uL 7.4  10.8  12.2   Hemoglobin 13.0 - 17.0 g/dL 47.8  29.5  62.1   Hematocrit 39.0 - 52.0 % 30.3  32.4  35.1   Platelets 150 - 400 K/uL 181  172  178     Lab Results  Component Value Date   NA 135  06/21/2023   K 3.2 (L) 06/21/2023   CL 99 06/21/2023   CO2 26 06/21/2023      Assessment/Plan: Severe sepsis with acute hypoxic respiratory failure secondary to aspiration pneumonia Slowly improving-titrated from heated high flow to salter high flow today-which she seems to be tolerating this morning Continue Zyvox/Zosyn for a few more days-given how tenuous his overall situation remains Volume status stable-do not think he requires diuretics today Continue mobilization Continue incentive spirometry/flutter valve/nebs/Mucinex Hopefully he can sustain being on salter high flow-and we can slowly titrate down FiO2.  If he worsens any further-he will require hospice/comfort care initiation. Appreciate palliative care input.  Dysphagia Chronic issue-evaluated by SLP-on dysphagia 3 diet.  History of VTE On Xarelto  CKD stage IIIb At baseline Follow electrolytes periodically  COPD Scheduled bronchodilators  OSA CPAP nightly  HLD Statin  HTN BP stable Continue amlodipine Resume losartan when able.  BPH Flomax  4.4 cm thoracic ascending aortic aneurysm Incidental finding on CT imaging. Radiology recommending annual CTA/MRA.  BMI: Estimated body mass index is 23.86 kg/m as calculated from the following:   Height as of this encounter: 5\' 11"  (1.803 m).   Weight as of this encounter: 77.6 kg.   Code status:   Code Status: Limited: Do not attempt resuscitation (DNR) -DNR-LIMITED -Do Not Intubate/DNI    DVT Prophylaxis: rivaroxaban (XARELTO) tablet 10 mg Start: 06/15/23 1000 rivaroxaban (XARELTO) tablet 10 mg    Family Communication: Spouse at bedside   Disposition Plan: Status is: Inpatient Remains  inpatient appropriate because: Severity of illness   Planned Discharge Destination:Home   Diet: Diet Order             DIET DYS 3 Room service appropriate? Yes with Assist; Fluid consistency: Thin  Diet effective now                     Antimicrobial  agents: Anti-infectives (From admission, onward)    Start     Dose/Rate Route Frequency Ordered Stop   06/19/23 1000  linezolid (ZYVOX) IVPB 600 mg        600 mg 300 mL/hr over 60 Minutes Intravenous Every 12 hours 06/19/23 0854     06/19/23 0945  piperacillin-tazobactam (ZOSYN) IVPB 3.375 g        3.375 g 12.5 mL/hr over 240 Minutes Intravenous Every 8 hours 06/19/23 0854     06/17/23 1000  Ampicillin-Sulbactam (UNASYN) 3 g in sodium chloride 0.9 % 100 mL IVPB  Status:  Discontinued        3 g 200 mL/hr over 30 Minutes Intravenous Every 6 hours 06/17/23 0911 06/19/23 1025   06/15/23 2230  azithromycin (ZITHROMAX) 500 mg in sodium chloride 0.9 % 250 mL IVPB  Status:  Discontinued        500 mg 250 mL/hr  Intravenous Every 24 hours 06/15/23 0410 06/17/23 0911   06/15/23 2200  cefTRIAXone (ROCEPHIN) 2 g in sodium chloride 0.9 % 100 mL IVPB  Status:  Discontinued        2 g 200 mL/hr over 30 Minutes Intravenous Every 24 hours 06/15/23 0410 06/17/23 0911   06/14/23 2330  cefTRIAXone (ROCEPHIN) 2 g in sodium chloride 0.9 % 100 mL IVPB        2 g 200 mL/hr over 30 Minutes Intravenous  Once 06/14/23 2326 06/15/23 0025   06/14/23 2330  azithromycin (ZITHROMAX) 500 mg in sodium chloride 0.9 % 250 mL IVPB        500 mg 250 mL/hr over 60 Minutes Intravenous  Once 06/14/23 2326 06/15/23 0056        MEDICATIONS: Scheduled Meds:  amLODipine  5 mg Oral Daily   arformoterol  15 mcg Nebulization BID   atorvastatin  20 mg Oral Daily   budesonide (PULMICORT) nebulizer solution  0.25 mg Nebulization BID   citalopram  20 mg Oral QHS   cyanocobalamin  1,000 mcg Oral Daily   guaiFENesin  600 mg Oral BID   levalbuterol  0.63 mg Nebulization TID   And   ipratropium  0.5 mg Nebulization TID   multivitamin with minerals  1 tablet Oral Daily   pantoprazole  40 mg Oral Daily   revefenacin  175 mcg Nebulization Daily   rivaroxaban  10 mg Oral Daily   tamsulosin  0.4 mg Oral Daily   Continuous  Infusions:  linezolid (ZYVOX) IV 600 mg (06/21/23 0851)   piperacillin-tazobactam (ZOSYN)  IV 3.375 g (06/21/23 0852)   PRN Meds:.acetaminophen, hydrALAZINE, melatonin, menthol-cetylpyridinium, mouth rinse, phenol, polyethylene glycol, prochlorperazine, simethicone   I have personally reviewed following labs and imaging studies  LABORATORY DATA: CBC: Recent Labs  Lab 06/14/23 2006 06/15/23 0321 06/15/23 0352 06/19/23 0600 06/20/23 0500 06/21/23 0552  WBC 10.1 11.9*  --  12.2* 10.8* 7.4  NEUTROABS 7.9* 11.3*  --   --   --   --   HGB 12.9* 10.6* 10.9* 12.0* 11.1* 10.3*  HCT 39.0 32.3* 32.0* 35.1* 32.4* 30.3*  MCV 98.5 99.7  --  94.9 94.5 94.4  PLT 163 153  --  178 172 181    Basic Metabolic Panel: Recent Labs  Lab 06/15/23 0321 06/15/23 0352 06/16/23 0444 06/17/23 0431 06/18/23 0454 06/19/23 0600 06/20/23 0500 06/21/23 0552  NA 138   < > 138 139 139 140 137 135  K 3.6   < > 3.9 3.7 2.9* 3.8 3.7 3.2*  CL 109  --  111 106 101 103 100 99  CO2 17*  --  22 23 26 29 29 26   GLUCOSE 192*  --  100* 94 108* 131* 131* 84  BUN 22  --  19 18 18 17 17 14   CREATININE 1.49*  --  1.28* 1.42* 1.48* 1.63* 1.62* 1.52*  CALCIUM 8.1*  --  8.1* 8.1* 8.2* 8.1* 8.3* 8.1*  MG 1.6*  --  1.9  --   --   --   --   --   PHOS 3.0  --   --   --   --   --   --   --    < > = values in this interval not displayed.    GFR: Estimated Creatinine Clearance: 39.2 mL/min (A) (by C-G formula based on SCr of 1.52 mg/dL (H)).  Liver Function Tests: Recent Labs  Lab 06/14/23 2006 06/15/23 0321 06/21/23 0552  AST 23 27 68*  ALT 18 15 68*  ALKPHOS 54 43 54  BILITOT 1.1 0.6 1.1  PROT 7.1 5.6* 5.8*  ALBUMIN 3.7 2.8* 2.3*   No results for input(s): "LIPASE", "AMYLASE" in the last 168 hours. No results for input(s): "AMMONIA" in the last 168 hours.  Coagulation Profile: Recent Labs  Lab 06/14/23 2006  INR 1.2    Cardiac Enzymes: No results for input(s): "CKTOTAL", "CKMB", "CKMBINDEX",  "TROPONINI" in the last 168 hours.  BNP (last 3 results) No results for input(s): "PROBNP" in the last 8760 hours.  Lipid Profile: No results for input(s): "CHOL", "HDL", "LDLCALC", "TRIG", "CHOLHDL", "LDLDIRECT" in the last 72 hours.  Thyroid Function Tests: No results for input(s): "TSH", "T4TOTAL", "FREET4", "T3FREE", "THYROIDAB" in the last 72 hours.  Anemia Panel: No results for input(s): "VITAMINB12", "FOLATE", "FERRITIN", "TIBC", "IRON", "RETICCTPCT" in the last 72 hours.  Urine analysis:    Component Value Date/Time   COLORURINE YELLOW 06/15/2023 0309   APPEARANCEUR CLEAR 06/15/2023 0309   LABSPEC 1.010 06/15/2023 0309   PHURINE 5.5 06/15/2023 0309   GLUCOSEU NEGATIVE 06/15/2023 0309   HGBUR NEGATIVE 06/15/2023 0309   BILIRUBINUR NEGATIVE 06/15/2023 0309   KETONESUR NEGATIVE 06/15/2023 0309   PROTEINUR NEGATIVE 06/15/2023 0309   UROBILINOGEN 0.2 07/14/2009 1843   NITRITE NEGATIVE 06/15/2023 0309   LEUKOCYTESUR NEGATIVE 06/15/2023 0309    Sepsis Labs: Lactic Acid, Venous    Component Value Date/Time   LATICACIDVEN 0.9 06/17/2023 0431    MICROBIOLOGY: Recent Results (from the past 240 hours)  Blood Culture (routine x 2)     Status: None   Collection Time: 06/14/23  8:06 PM   Specimen: BLOOD  Result Value Ref Range Status   Specimen Description BLOOD RIGHT ARM  Final   Special Requests   Final    BOTTLES DRAWN AEROBIC AND ANAEROBIC Blood Culture adequate volume   Culture   Final    NO GROWTH 5 DAYS Performed at [facility], [address], [place], [place] [address]    Report Status 06/19/2023 FINAL  Final  Resp panel by RT-PCR (RSV, Flu A&B, Covid) Anterior Nasal Swab     Status: None  Collection Time: 06/14/23  8:27 PM   Specimen: Anterior Nasal Swab  Result Value Ref Range Status   SARS Coronavirus 2 by RT PCR NEGATIVE NEGATIVE Final   Influenza A by PCR NEGATIVE NEGATIVE Final   Influenza B by PCR NEGATIVE NEGATIVE Final    Comment:  (NOTE) The Xpert Xpress SARS-CoV-2/FLU/RSV plus assay is intended as an aid in the diagnosis of influenza from Nasopharyngeal swab specimens and should not be used as a sole basis for treatment. Nasal washings and aspirates are unacceptable for Xpert Xpress SARS-CoV-2/FLU/RSV testing.  Fact Sheet for Patients: BloggerCourse.com  Fact Sheet for Healthcare Providers: SeriousBroker.it  This test is not yet approved or cleared by the [place] FDA and has been authorized for detection and/or diagnosis of SARS-CoV-2 by FDA under an Emergency Use Authorization (EUA). This EUA will remain in effect (meaning this test can be used) for the duration of the COVID-19 declaration under Section 564(b)(1) of the Act, 21 U.S.C. section 360bbb-3(b)(1), unless the authorization is terminated or revoked.     Resp Syncytial Virus by PCR NEGATIVE NEGATIVE Final    Comment: (NOTE) Fact Sheet for Patients: BloggerCourse.com  Fact Sheet for Healthcare Providers: SeriousBroker.it  This test is not yet approved or cleared by the [place] FDA and has been authorized for detection and/or diagnosis of SARS-CoV-2 by FDA under an Emergency Use Authorization (EUA). This EUA will remain in effect (meaning this test can be used) for the duration of the COVID-19 declaration under Section 564(b)(1) of the Act, 21 U.S.C. section 360bbb-3(b)(1), unless the authorization is terminated or revoked.  Performed at [facility], [address], [place], [place] [address]   Blood Culture (routine x 2)     Status: None   Collection Time: 06/14/23  8:30 PM   Specimen: BLOOD  Result Value Ref Range Status   Specimen Description BLOOD LEFT ARM  Final   Special Requests   Final    BOTTLES DRAWN AEROBIC AND ANAEROBIC Blood Culture adequate volume   Culture   Final    NO GROWTH 5 DAYS Performed at [facility], [address], [place], [place] [address]    Report Status 06/19/2023 FINAL  Final  Expectorated Sputum Assessment w Gram Stain, Rflx to Resp Cult     Status: None   Collection Time: 06/16/23  4:48 AM   Specimen: Expectorated Sputum  Result Value Ref Range Status   Specimen Description EXPECTORATED SPUTUM  Final   Special Requests NONE  Final   Sputum evaluation   Final    THIS SPECIMEN IS ACCEPTABLE FOR SPUTUM CULTURE Performed at [facility], [address] [place], [place] [address]    Report Status 06/16/2023 FINAL  Final  Culture, Respiratory w Gram Stain     Status: None   Collection Time: 06/16/23  4:48 AM  Result Value Ref Range Status   Specimen Description EXPECTORATED SPUTUM  Final   Special Requests NONE Reflexed from [id number]  Final   Gram Stain   Final    FEW WBC PRESENT, PREDOMINANTLY PMN FEW GRAM NEGATIVE RODS FEW GRAM POSITIVE COCCI    Culture   Final    MODERATE Normal respiratory flora-no Staph aureus or Pseudomonas seen Performed at [facility], [address], [place], [place] [address]    Report Status 06/19/2023 FINAL  Final    RADIOLOGY STUDIES/RESULTS: DG Chest Port 1 View Result Date: 06/21/2023 CLINICAL DATA:  Shortness of breath. EXAM:  PORTABLE CHEST 1 VIEW COMPARISON:  CT chest and chest x-ray dated June 19, 2023. FINDINGS: Stable cardiomediastinal silhouette with normal heart size. Similar bibasilar consolidation, worse on the left. Bilateral pleural plaques again noted. No pleural effusion or pneumothorax. No acute osseous abnormality. IMPRESSION: 1. Similar bibasilar consolidation, worse on the left, concerning for pneumonia. Electronically Signed   By: Obie Dredge M.D.   On: 06/21/2023 09:58   CT CHEST WO CONTRAST Result Date: 06/19/2023 CLINICAL DATA:  Pneumonia, complications suspected. Shortness of breath. EXAM: CT CHEST WITHOUT CONTRAST TECHNIQUE: Multidetector CT imaging of the chest was performed following the  standard protocol without IV contrast. RADIATION DOSE REDUCTION: This exam was performed according to the departmental dose-optimization program which includes automated exposure control, adjustment of the mA and/or kV according to patient size and/or use of iterative reconstruction technique. COMPARISON:  Radiographs 06/19/2023, 06/18/2023 and earlier. Chest CT 06/14/2023 and 12/04/2020. FINDINGS: Cardiovascular: Again demonstrated is extensive atherosclerosis of the aorta, great vessels and coronary arteries. Dilatation of the ascending aorta is suboptimally evaluated due to motion artifact, but grossly stable, measuring approximately 4.2 cm. No acute vascular findings on noncontrast imaging. The heart size is normal. There is no pericardial effusion. Mediastinum/Nodes: There are no enlarged mediastinal, hilar or axillary lymph nodes.Small mediastinal lymph nodes appear unchanged. The thyroid gland, trachea and esophagus demonstrate no significant findings. Lungs/Pleura: No pneumothorax or significant pleural effusion. Fairly extensive calcified pleural plaques are again noted bilaterally. Underlying moderate centrilobular emphysema with diffuse central airway thickening. There is new complete right middle lobe collapse. New dependent consolidation within both lower lobes, suspicious for pneumonia. Upper abdomen: No acute findings are seen in the visualized upper abdomen. Small gallstones are again noted. Grossly stable renal cystic lesions bilaterally for which no specific follow-up imaging is recommended. Extensive vascular calcifications noted. Musculoskeletal/Chest wall: There is no chest wall mass or suspicious osseous finding. Old rib fractures and mild spondylosis noted. IMPRESSION: 1. New complete right middle lobe collapse and dependent consolidation within both lower lobes, suspicious for multilobar pneumonia. Radiographic follow-up recommended to ensure resolution. 2. Stable calcified pleural plaques  bilaterally, consistent with asbestos related pleural disease. 3. Grossly stable dilatation of the ascending aorta. 4. Aortic Atherosclerosis (ICD10-I70.0) and Emphysema (ICD10-J43.9). Electronically Signed   By: Carey Bullocks M.D.   On: 06/19/2023 16:34     LOS: 6 days   Jeoffrey Massed, MD  [facility]    To contact the attending provider between 7A-7P or the covering provider during after hours 7P-7A, please log into the web site [email or web address] and access using universal [place] password for that web site. If you do not have the password, please call the hospital operator.  06/21/2023, 10:48 AM

## 2023-06-21 NOTE — Plan of Care (Signed)
  Problem: Education: Goal: Knowledge of General Education information will improve Description: Including pain rating scale, medication(s)/side effects and non-pharmacologic comfort measures Outcome: Progressing   Problem: Health Behavior/Discharge Planning: Goal: Ability to manage health-related needs will improve Outcome: Progressing   Problem: Clinical Measurements: Goal: Ability to maintain clinical measurements within normal limits will improve Outcome: Progressing Goal: Respiratory complications will improve Outcome: Progressing   Problem: Activity: Goal: Risk for activity intolerance will decrease Outcome: Progressing   Problem: Nutrition: Goal: Adequate nutrition will be maintained Outcome: Progressing   Problem: Coping: Goal: Level of anxiety will decrease Outcome: Progressing   

## 2023-06-21 NOTE — Progress Notes (Signed)
 Daily Progress Note   Patient Name: Steven Guerrero       Date: 06/21/2023 DOB: 08/02/1940  Age: 83 y.o. MRN#: [id number] Attending Physician: Maretta Bees, MD Primary Care Physician: Merri Brunette, MD Admit Date: 06/14/2023  Reason for Consultation/Follow-up: Establishing goals of care  Subjective: Medical records reviewed including progress notes, labs and imaging. Patient assessed at the bedside.  He is sitting up in bedside chair, reports feeling better today.  His wife and stepdaughter are present visiting as well. They are glad that he can move to chair today.  Created space and opportunity for patient's thoughts and feelings of his current illness.  He is "taking what he can get."  We discussed the importance of considering his priorities and what he wishes to fight for, whether continued improvement and recovery or comfort and peace. Patient and family understand that his X-ray is still not better and it is unknown how things will go from day to day. They are enjoying their time together for now and appreciative of palliative support.  Questions and concerns addressed. PMT will continue to support holistically.   Length of Stay: 6   Physical Exam Vitals and nursing note reviewed.  Constitutional:      General: He is not in acute distress.    Appearance: He is ill-appearing.     Interventions: Nasal cannula in place.     Comments: 15 L   Cardiovascular:     Rate and Rhythm: Normal rate.  Pulmonary:     Effort: Pulmonary effort is normal.  Neurological:     Mental Status: He is alert. Mental status is at baseline.            Vital Signs: BP 123/67 (BP Location: Left Arm)   Pulse 66   Temp 98.6 F (37 C) (Oral)   Resp 19   Ht 5\' 11"  (1.803 m)   Wt 77.6 kg   SpO2 95%   BMI 23.86 kg/m  SpO2: SpO2: 95 % O2 Device: O2  Device: (S) High Flow Nasal Cannula O2 Flow Rate: O2 Flow Rate (L/min): 15 L/min      Palliative Assessment/Data: 40-50%   Palliative Care Assessment & Plan   Patient Profile: 83 y.o. male  with past medical history of VTE/PE on Xarelto, COPD, CVA, chronic dysphagia, OSA not on CPAP, CKD 3B, GERD, COPD/emphysema, chronic bronchitis, subcentimeter lung nodules admitted on 06/14/2023 with sudden onset shortness of breath, productive cough, and wheezing.    Patient was admitted with severe sepsis with acute hypoxic respiratory failure secondary to aspiration pneumonia. He remains dyspneic with minimal exertion and  requiring HHFNC. PMT has been consulted to assist with goals of care conversation.  Assessment: Goals of care conversation Sepsis with acute hypoxic respiratory failure secondary to aspiration pneumonia  Recommendations/Plan: Continue DNR/DNI Continue current care plan and allow more time for outcomes.  Family is aware of concerning prognosis and potential need for hospice if he does not improve Psychosocial and emotional for provided PMT will continue to follow   Prognosis: High risk for further decline  Discharge Planning: To Be Determined  Care plan was discussed with patient, patient's wife, stepdaughter          Shanica Castellanos Jeni Salles, [place]  Palliative Medicine Team Team phone # [phone]  Thank you for allowing the Palliative Medicine Team to assist in the care of this patient. Please utilize secure chat with additional questions, if there is no response within 30 minutes please call the above phone number.  Palliative Medicine Team providers are available by phone from 7am to 7pm daily and can be reached through the team cell phone.  Should this patient require assistance outside of these hours, please call the patient's attending physician.

## 2023-06-21 NOTE — Progress Notes (Signed)
   06/21/23 0041  Respiratory Severity Assessment  $ Protocol Assessment  Yes  Heart Rate 0  Breath Sounds 1  Respiratory Pattern 1  Cough 0  Chest X Ray 1  O2 Requirements/ Pulse Ox Sat(%) 3  Mental Status 0  Dyspnea 1  Score Total 7  Aerosolized Bronchodilators  Aerosolized bronchodilator indications COPD exacerbation/maintenance therapy  Medication Plan of Care Hand held neb treatment  Bronchial Hygiene  Bronchial Hygiene Plan of Care Cough & Deep breath  Oxygen Therapy  Oxygen therapy indications Oxygen therapy indicated;SpO2 less than 92% or MD goal  Oxygen Plan of care Pulse oximetry;Heated High Flow  Respiratory Therapy Follow Up Assessment  Assessment follow up date 06/22/23  Follow-up assessment complete Yes   Assesses and change nebs from Q8 to TID to be more in line with Mumomyst nebs and to allow patient to sleep at night.

## 2023-06-22 DIAGNOSIS — J9601 Acute respiratory failure with hypoxia: Secondary | ICD-10-CM | POA: Diagnosis not present

## 2023-06-22 DIAGNOSIS — J189 Pneumonia, unspecified organism: Secondary | ICD-10-CM | POA: Diagnosis not present

## 2023-06-22 DIAGNOSIS — A419 Sepsis, unspecified organism: Secondary | ICD-10-CM | POA: Diagnosis not present

## 2023-06-22 DIAGNOSIS — Z515 Encounter for palliative care: Secondary | ICD-10-CM | POA: Diagnosis not present

## 2023-06-22 DIAGNOSIS — I159 Secondary hypertension, unspecified: Secondary | ICD-10-CM | POA: Diagnosis not present

## 2023-06-22 DIAGNOSIS — Z7189 Other specified counseling: Secondary | ICD-10-CM | POA: Diagnosis not present

## 2023-06-22 LAB — BASIC METABOLIC PANEL WITH GFR
Anion gap: 10 (ref 5–15)
BUN: 12 mg/dL (ref 8–23)
CO2: 28 mmol/L (ref 22–32)
Calcium: 8.8 mg/dL — ABNORMAL LOW (ref 8.9–10.3)
Chloride: 99 mmol/L (ref 98–111)
Creatinine, Ser: 1.45 mg/dL — ABNORMAL HIGH (ref 0.61–1.24)
GFR, Estimated: 48 mL/min — ABNORMAL LOW (ref >60)
Glucose, Bld: 105 mg/dL — ABNORMAL HIGH (ref 70–99)
Potassium: 3.5 mmol/L (ref 3.5–5.1)
Sodium: 137 mmol/L (ref 135–145)

## 2023-06-22 LAB — CBC
HCT: 34.7 % — ABNORMAL LOW (ref 39.0–52.0)
Hemoglobin: 12 g/dL — ABNORMAL LOW (ref 13.0–17.0)
MCH: 32.5 pg (ref 26.0–34.0)
MCHC: 34.6 g/dL (ref 30.0–36.0)
MCV: 94 fL (ref 80.0–100.0)
Platelets: 203 10*3/uL (ref 150–400)
RBC: 3.69 MIL/uL — ABNORMAL LOW (ref 4.22–5.81)
RDW: 12.5 % (ref 11.5–15.5)
WBC: 6.5 10*3/uL (ref 4.0–10.5)
nRBC: 0 % (ref 0.0–0.2)

## 2023-06-22 LAB — MAGNESIUM: Magnesium: 1.9 mg/dL (ref 1.7–2.4)

## 2023-06-22 MED ORDER — LEVALBUTEROL HCL 0.63 MG/3ML IN NEBU
0.6300 mg | INHALATION_SOLUTION | Freq: Two times a day (BID) | RESPIRATORY_TRACT | Status: DC
  Administered 2023-06-22 – 2023-06-24 (×5): 0.63 mg via RESPIRATORY_TRACT
  Filled 2023-06-22 (×5): qty 3

## 2023-06-22 MED ORDER — IPRATROPIUM BROMIDE 0.02 % IN SOLN
0.5000 mg | Freq: Two times a day (BID) | RESPIRATORY_TRACT | Status: DC
  Administered 2023-06-22 – 2023-06-24 (×4): 0.5 mg via RESPIRATORY_TRACT
  Filled 2023-06-22 (×4): qty 2.5

## 2023-06-22 NOTE — Progress Notes (Signed)
 Physical Therapy Treatment Patient Details Name: Steven Guerrero MRN: [id number] DOB: Aug 02, 1940 Today's Date: 06/22/2023   History of Present Illness Pt is 83 yo male who presents on 06/15/23 with SOB, wheezing, and productive cough. CT concerning for PNA. Code sepsis called in ED. PMH: CVA, TBI 2003, PE, OSA not on CPAP, CKD3, GERD, COPD/ emphysema, chronic bronchitis, lung nodules, R THA    PT Comments  Patient is agreeable to PT with encouragement from spouse. Patient required Mod for standing with emphasis on hand placement and eccentric control. Standing tolerance of around 45 seconds with heavy reliance on rolling walker for support. Sp02 89-90% on 6 L02. Patient participated with extremity exercises for strengthening. Activity tolerance is limited by fatigue. Recommend to continue PT to maximize independence. Anticipate patient will require initial physical assistance for mobility after this hospital stay.    If plan is discharge home, recommend the following: A lot of help with walking and/or transfers;A lot of help with bathing/dressing/bathroom;Assist for transportation;Assistance with cooking/housework   Can travel by private vehicle     No  Equipment Recommendations  None recommended by PT    Recommendations for Other Services       Precautions / Restrictions Precautions Precautions: Fall Recall of Precautions/Restrictions: Intact Precaution/Restrictions Comments: monitor Sp02 with high 02 requirement Restrictions Weight Bearing Restrictions Per Provider Order: No     Mobility  Bed Mobility               General bed mobility comments: not assessed as patient sitting up on arrival and post session    Transfers Overall transfer level: Needs assistance Equipment used: Rolling walker (2 wheels) Transfers: Sit to/from Stand Sit to Stand: Mod assist           General transfer comment: lifting and lowering assistance provided for standing. cues for anterior  weight shifting and hand placement.    Ambulation/Gait             Pre-gait activities: emphasis on standing posture, standing tolerance of around 45 seconds General Gait Details: not attempted due to fatigue with minimal activity. Sp02 89-90% on 6 L02   Stairs             Wheelchair Mobility     Tilt Bed    Modified Rankin (Stroke Patients Only)       Balance Overall balance assessment: Needs assistance Sitting-balance support: Feet supported Sitting balance-Leahy Scale: Fair     Standing balance support: Bilateral upper extremity supported Standing balance-Leahy Scale: Poor Standing balance comment: external support required to maintain standing balance                            Communication Communication Communication: Impaired Factors Affecting Communication: Reduced clarity of speech  Cognition Arousal: Alert Behavior During Therapy: WFL for tasks assessed/performed   PT - Cognitive impairments: Difficult to assess Difficult to assess due to: Impaired communication                     PT - Cognition Comments: reduced clarity of speech. patient is cooperative and able to follow single step commands with increased time Following commands: Intact      Cueing Cueing Techniques: Verbal cues, Gestural cues  Exercises General Exercises - Upper Extremity Elbow Flexion: Strengthening, Both, 10 reps, Seated, AROM Elbow Extension: AROM, Both, 10 reps, Seated General Exercises - Lower Extremity Ankle Circles/Pumps: AROM, Strengthening, Both, 10 reps, Seated Long Arc  Quad: AAROM, Strengthening, Both, 10 reps, Seated Hip Flexion/Marching: AAROM, Strengthening, Both, 10 reps, Seated Other Exercises Other Exercises: verbal cues for exercise technique for strengthening    General Comments        Pertinent Vitals/Pain Pain Assessment Pain Assessment: No/denies pain    Home Living                          Prior Function             PT Goals (current goals can now be found in the care plan section) Acute Rehab PT Goals Patient Stated Goal: return home PT Goal Formulation: With patient/family Time For Goal Achievement: 06/29/23 Potential to Achieve Goals: Fair Progress towards PT goals: Progressing toward goals    Frequency    Min 2X/week      PT Plan      Co-evaluation              AM-PAC PT "6 Clicks" Mobility   Outcome Measure  Help needed turning from your back to your side while in a flat bed without using bedrails?: A Little Help needed moving from lying on your back to sitting on the side of a flat bed without using bedrails?: A Little Help needed moving to and from a bed to a chair (including a wheelchair)?: A Little Help needed standing up from a chair using your arms (e.g., wheelchair or bedside chair)?: A Lot Help needed to walk in hospital room?: Total Help needed climbing 3-5 steps with a railing? : Total 6 Click Score: 13    End of Session Equipment Utilized During Treatment: Oxygen Activity Tolerance: Patient limited by fatigue Patient left: in chair;with call bell/phone within reach;with family/visitor present (spouse at the bedside) Nurse Communication: Mobility status PT Visit Diagnosis: Unsteadiness on feet (R26.81);Muscle weakness (generalized) (M62.81);History of falling (Z91.81);Difficulty in walking, not elsewhere classified (R26.2)     Time: 1610-9604 PT Time Calculation (min) (ACUTE ONLY): 13 min  Charges:    $Therapeutic Activity: 8-22 mins PT General Charges $$ ACUTE PT VISIT: 1 Visit                     Donna Bernard, PT, MPT    Ina Homes 06/22/2023, 1:46 PM

## 2023-06-22 NOTE — Progress Notes (Signed)
 Daily Progress Note   Patient Name: Steven Guerrero       Date: 06/22/2023 DOB: April 14, 1940  Age: 83 y.o. MRN#: [id number] Attending Physician: Maretta Bees, MD Primary Care Physician: Merri Brunette, MD Admit Date: 06/14/2023  Reason for Consultation/Follow-up: Establishing goals of care  Subjective: Medical records reviewed including progress notes, labs and imaging. Patient assessed at the bedside. He is sitting up in bedside chair, reports feeling happy about his slow and steady improvements. His wife is present visiting.  Patient and family share that they may be ready to take [employer] home in 3 days or so. Explored his thoughts and feelings on discharge options including SNF or home health with PT. He would prefer not go to to SNF, as he would want his wife present for support overnight and they understand that this would not be allowed. His wife is supportive of this preference.  A MOST form was introduced and patient/family were encouraged to review for possible completion prior to discharge. I shared that I would be back on service Wednesday 4/2 and that I am happy to continue reviewing. Outpatient palliative care was also explained and offered.   Questions and concerns addressed. PMT will continue to support holistically.   Length of Stay: 7   Physical Exam Vitals and nursing note reviewed.  Constitutional:      General: He is not in acute distress.    Appearance: He is ill-appearing.     Interventions: Nasal cannula in place.     Comments: 6 L   Cardiovascular:     Rate and Rhythm: Normal rate.  Pulmonary:     Effort: Pulmonary effort is normal.  Neurological:     Mental Status: He is alert. Mental status is at baseline.            Vital Signs: BP (!) 142/76 (BP Location: Left Arm)   Pulse 76   Temp 97.7 F (36.5 C)  (Oral)   Resp (!) 27   Ht 5\' 11"  (1.803 m)   Wt 77.6 kg   SpO2 91%   BMI 23.86 kg/m  SpO2: SpO2: 91 % O2 Device: O2 Device: High Flow Nasal Cannula O2 Flow Rate: O2 Flow Rate (L/min): (S) 6 L/min      Palliative Assessment/Data: 40-50%   Palliative Care Assessment & Plan   Patient Profile: 83 y.o. male  with past medical history of VTE/PE on Xarelto, COPD, CVA, chronic dysphagia, OSA not on CPAP, CKD 3B, GERD, COPD/emphysema, chronic bronchitis, subcentimeter lung nodules admitted on 06/14/2023 with sudden onset shortness of breath, productive cough, and wheezing.  Patient was admitted with severe sepsis with acute hypoxic respiratory failure secondary to aspiration pneumonia. He remains dyspneic with minimal exertion and requiring HHFNC. PMT has been consulted to assist with goals of care conversation.  Assessment: Goals of care conversation Sepsis with acute hypoxic respiratory failure secondary to aspiration pneumonia, improving  Recommendations/Plan: Continue DNR/DNI Continue current care plan, patient and family are hopeful for further improvement Patient leaning against SNF at this time MOST form introduced today Ongoing GOC discussions Psychosocial and emotional support provided PMT will see again 4/2     Prognosis: High risk for further decline  Discharge Planning: To Be Determined  Care plan was discussed with patient, patient's wife          Erandi Lemma Jeni Salles, PA-C  Palliative Medicine Team Team phone # [phone]  Thank you for allowing the Palliative Medicine Team to assist in the care of this patient. Please utilize secure chat with additional questions, if there is no response within 30 minutes please call the above phone number.  Palliative Medicine Team providers are available by phone from 7am to 7pm daily and can be reached through the team cell phone.  Should this patient require assistance outside of these hours, please call the patient's  attending physician.

## 2023-06-22 NOTE — Progress Notes (Signed)
 PROGRESS NOTE        PATIENT DETAILS Name: Steven Guerrero Age: 83 y.o. Sex: male Date of Birth: [birth year] Admit Date: 06/14/2023 Admitting Physician Darlin Drop, DO NFA:[id number], Zollie Beckers, MD  Brief Summary: Patient is a 83 y.o.  male with history of VTE on Xarelto, COPD, CVA, chronic dysphagia, OSA not on CPAP-who presented with shortness of breath/cough/wheezing and 1 episode of vomiting-patient was found to have acute hypoxic respiratory failure secondary pneumonia and subsequently admitted to the hospitalist service.  Significant events: 3/23>> admitted TRH-SOB-presumed pneumonia.  Significant studies: 3/23>> CT chest/abdomen/pelvis: Developing infiltrates in lung bases. 3/28>> CT chest: Complete middle lobe collapse-bibasilar consolidation.  Significant microbiology data: 3/23>> COVID/influenza/RSV PCR: Negative 3/23>> blood culture: No growth 3/25>> sputum culture: Normal respiratory flora  Procedures: None  Consults: Palliative care  Subjective: No complaints this morning-feels better-Down to 6 L of HFNC.  Objective: Vitals: Blood pressure (!) 142/76, pulse 76, temperature 97.7 F (36.5 C), temperature source Oral, resp. rate (!) 27, height 5\' 11"  (1.803 m), weight 77.6 kg, SpO2 91%.   Exam: Gen Exam:Alert awake-not in any distress HEENT:atraumatic, normocephalic Chest: B/L clear to auscultation anteriorly CVS:S1S2 regular Abdomen:soft non tender, non distended Extremities:no edema Neurology: Non focal Skin: no rash  Pertinent Labs/Radiology:    Latest Ref Rng & Units 06/22/2023    4:34 AM 06/21/2023    5:52 AM 06/20/2023    5:00 AM  CBC  WBC 4.0 - 10.5 K/uL 6.5  7.4  10.8   Hemoglobin 13.0 - 17.0 g/dL 65.7  84.6  96.2   Hematocrit 39.0 - 52.0 % 34.7  30.3  32.4   Platelets 150 - 400 K/uL 203  181  172     Lab Results  Component Value Date   NA 137 06/22/2023   K 3.5 06/22/2023   CL 99 06/22/2023   CO2 28 06/22/2023       Assessment/Plan: Severe sepsis with acute hypoxic respiratory failure secondary to aspiration pneumonia Slowly improving-Down to 6 L of HFNC.  Previously was on heated high flow. Continue Zyvox/Zosyn x 7 days total Volume status stable-do not think he requires diuretics today Mobilize/out of bed to chair Incentive spirometry/flutter valve. Probably will require oxygen on discharge-will need assessment prior to discharge. Thankfully he is improving-both patient/family aware that if he were to worsen-apart from comfort measures-no other options.   Palliative care following.  Dysphagia Chronic issue-evaluated by SLP-on dysphagia 3 diet.  History of VTE On Xarelto  CKD stage IIIb At baseline Follow electrolytes periodically  COPD Scheduled bronchodilators  OSA CPAP nightly  HLD Statin  HTN BP stable Continue amlodipine Resume losartan when able.  BPH Flomax  4.4 cm thoracic ascending aortic aneurysm Incidental finding on CT imaging. Radiology recommending annual CTA/MRA.  BMI: Estimated body mass index is 23.86 kg/m as calculated from the following:   Height as of this encounter: 5\' 11"  (1.803 m).   Weight as of this encounter: 77.6 kg.   Code status:   Code Status: Limited: Do not attempt resuscitation (DNR) -DNR-LIMITED -Do Not Intubate/DNI    DVT Prophylaxis: rivaroxaban (XARELTO) tablet 10 mg Start: 06/15/23 1000 rivaroxaban (XARELTO) tablet 10 mg    Family Communication: Spouse at bedside   Disposition Plan: Status is: Inpatient Remains inpatient appropriate because: Severity of illness   Planned Discharge Destination:Home   Diet: Diet Order  DIET DYS 3 Room service appropriate? Yes with Assist; Fluid consistency: Thin  Diet effective now                     Antimicrobial agents: Anti-infectives (From admission, onward)    Start     Dose/Rate Route Frequency Ordered Stop   06/19/23 1000  linezolid (ZYVOX) IVPB 600 mg         600 mg 300 mL/hr over 60 Minutes Intravenous Every 12 hours 06/19/23 0854     06/19/23 0945  piperacillin-tazobactam (ZOSYN) IVPB 3.375 g        3.375 g 12.5 mL/hr over 240 Minutes Intravenous Every 8 hours 06/19/23 0854     06/17/23 1000  Ampicillin-Sulbactam (UNASYN) 3 g in sodium chloride 0.9 % 100 mL IVPB  Status:  Discontinued        3 g 200 mL/hr over 30 Minutes Intravenous Every 6 hours 06/17/23 0911 06/19/23 1025   06/15/23 2230  azithromycin (ZITHROMAX) 500 mg in sodium chloride 0.9 % 250 mL IVPB  Status:  Discontinued        500 mg 250 mL/hr  Intravenous Every 24 hours 06/15/23 0410 06/17/23 0911   06/15/23 2200  cefTRIAXone (ROCEPHIN) 2 g in sodium chloride 0.9 % 100 mL IVPB  Status:  Discontinued        2 g 200 mL/hr over 30 Minutes Intravenous Every 24 hours 06/15/23 0410 06/17/23 0911   06/14/23 2330  cefTRIAXone (ROCEPHIN) 2 g in sodium chloride 0.9 % 100 mL IVPB        2 g 200 mL/hr over 30 Minutes Intravenous  Once 06/14/23 2326 06/15/23 0025   06/14/23 2330  azithromycin (ZITHROMAX) 500 mg in sodium chloride 0.9 % 250 mL IVPB        500 mg 250 mL/hr over 60 Minutes Intravenous  Once 06/14/23 2326 06/15/23 0056        MEDICATIONS: Scheduled Meds:  amLODipine  5 mg Oral Daily   arformoterol  15 mcg Nebulization BID   atorvastatin  20 mg Oral Daily   budesonide (PULMICORT) nebulizer solution  0.25 mg Nebulization BID   citalopram  20 mg Oral QHS   cyanocobalamin  1,000 mcg Oral Daily   guaiFENesin  600 mg Oral BID   levalbuterol  0.63 mg Nebulization BID   And   ipratropium  0.5 mg Nebulization BID   multivitamin with minerals  1 tablet Oral Daily   pantoprazole  40 mg Oral Daily   revefenacin  175 mcg Nebulization Daily   rivaroxaban  10 mg Oral Daily   tamsulosin  0.4 mg Oral Daily   Continuous Infusions:  linezolid (ZYVOX) IV 600 mg (06/22/23 0936)   piperacillin-tazobactam (ZOSYN)  IV 3.375 g (06/22/23 0937)   PRN Meds:.acetaminophen,  hydrALAZINE, melatonin, menthol-cetylpyridinium, mouth rinse, phenol, polyethylene glycol, prochlorperazine, simethicone   I have personally reviewed following labs and imaging studies  LABORATORY DATA: CBC: Recent Labs  Lab 06/19/23 0600 06/20/23 0500 06/21/23 0552 06/22/23 0434  WBC 12.2* 10.8* 7.4 6.5  HGB 12.0* 11.1* 10.3* 12.0*  HCT 35.1* 32.4* 30.3* 34.7*  MCV 94.9 94.5 94.4 94.0  PLT 178 172 181 203    Basic Metabolic Panel: Recent Labs  Lab 06/16/23 0444 06/17/23 0431 06/18/23 0454 06/19/23 0600 06/20/23 0500 06/21/23 0552 06/22/23 0434  NA 138   < > 139 140 137 135 137  K 3.9   < > 2.9* 3.8 3.7 3.2* 3.5  CL 111   < >  101 103 100 99 99  CO2 22   < > 26 29 29 26 28   GLUCOSE 100*   < > 108* 131* 131* 84 105*  BUN 19   < > 18 17 17 14 12   CREATININE 1.28*   < > 1.48* 1.63* 1.62* 1.52* 1.45*  CALCIUM 8.1*   < > 8.2* 8.1* 8.3* 8.1* 8.8*  MG 1.9  --   --   --   --   --  1.9   < > = values in this interval not displayed.    GFR: Estimated Creatinine Clearance: 41.1 mL/min (A) (by C-G formula based on SCr of 1.45 mg/dL (H)).  Liver Function Tests: Recent Labs  Lab 06/21/23 0552  AST 68*  ALT 68*  ALKPHOS 54  BILITOT 1.1  PROT 5.8*  ALBUMIN 2.3*   No results for input(s): "LIPASE", "AMYLASE" in the last 168 hours. No results for input(s): "AMMONIA" in the last 168 hours.  Coagulation Profile: No results for input(s): "INR", "PROTIME" in the last 168 hours.   Cardiac Enzymes: No results for input(s): "CKTOTAL", "CKMB", "CKMBINDEX", "TROPONINI" in the last 168 hours.  BNP (last 3 results) No results for input(s): "PROBNP" in the last 8760 hours.  Lipid Profile: No results for input(s): "CHOL", "HDL", "LDLCALC", "TRIG", "CHOLHDL", "LDLDIRECT" in the last 72 hours.  Thyroid Function Tests: No results for input(s): "TSH", "T4TOTAL", "FREET4", "T3FREE", "THYROIDAB" in the last 72 hours.  Anemia Panel: No results for input(s): "VITAMINB12",  "FOLATE", "FERRITIN", "TIBC", "IRON", "RETICCTPCT" in the last 72 hours.  Urine analysis:    Component Value Date/Time   COLORURINE YELLOW 06/15/2023 0309   APPEARANCEUR CLEAR 06/15/2023 0309   LABSPEC 1.010 06/15/2023 0309   PHURINE 5.5 06/15/2023 0309   GLUCOSEU NEGATIVE 06/15/2023 0309   HGBUR NEGATIVE 06/15/2023 0309   BILIRUBINUR NEGATIVE 06/15/2023 0309   KETONESUR NEGATIVE 06/15/2023 0309   PROTEINUR NEGATIVE 06/15/2023 0309   UROBILINOGEN 0.2 07/14/2009 1843   NITRITE NEGATIVE 06/15/2023 0309   LEUKOCYTESUR NEGATIVE 06/15/2023 0309    Sepsis Labs: Lactic Acid, Venous    Component Value Date/Time   LATICACIDVEN 0.9 06/17/2023 0431    MICROBIOLOGY: Recent Results (from the past 240 hours)  Blood Culture (routine x 2)     Status: None   Collection Time: 06/14/23  8:06 PM   Specimen: BLOOD  Result Value Ref Range Status   Specimen Description BLOOD RIGHT ARM  Final   Special Requests   Final    BOTTLES DRAWN AEROBIC AND ANAEROBIC Blood Culture adequate volume   Culture   Final    NO GROWTH 5 DAYS Performed at [facility], [address] [place], [place] [address]    Report Status 06/19/2023 FINAL  Final  Resp panel by RT-PCR (RSV, Flu A&B, Covid) Anterior Nasal Swab     Status: None   Collection Time: 06/14/23  8:27 PM   Specimen: Anterior Nasal Swab  Result Value Ref Range Status   SARS Coronavirus 2 by RT PCR NEGATIVE NEGATIVE Final   Influenza A by PCR NEGATIVE NEGATIVE Final   Influenza B by PCR NEGATIVE NEGATIVE Final    Comment: (NOTE) The Xpert Xpress SARS-CoV-2/FLU/RSV plus assay is intended as an aid in the diagnosis of influenza from Nasopharyngeal swab specimens and should not be used as a sole basis for treatment. Nasal washings and aspirates are unacceptable for Xpert Xpress SARS-CoV-2/FLU/RSV testing.  Fact Sheet for Patients: BloggerCourse.com  Fact Sheet for Healthcare  Providers: SeriousBroker.it  This test is not yet approved or cleared by the [place] and has been authorized for detection and/or diagnosis of SARS-CoV-2 by FDA under an Emergency Use Authorization (EUA). This EUA will remain in effect (meaning this test can be used) for the duration of the COVID-19 declaration under Section 564(b)(1) of the Act, 21 U.S.C. section 360bbb-3(b)(1), unless the authorization is terminated or revoked.     Resp Syncytial Virus by PCR NEGATIVE NEGATIVE Final    Comment: (NOTE) Fact Sheet for Patients: BloggerCourse.com  Fact Sheet for Healthcare Providers: SeriousBroker.it  This test is not yet approved or cleared by the [place] FDA and has been authorized for detection and/or diagnosis of SARS-CoV-2 by FDA under an Emergency Use Authorization (EUA). This EUA will remain in effect (meaning this test can be used) for the duration of the COVID-19 declaration under Section 564(b)(1) of the Act, 21 U.S.C. section 360bbb-3(b)(1), unless the authorization is terminated or revoked.  Performed at [facility], [address] [place], [place] [address]   Blood Culture (routine x 2)     Status: None   Collection Time: 06/14/23  8:30 PM   Specimen: BLOOD  Result Value Ref Range Status   Specimen Description BLOOD LEFT ARM  Final   Special Requests   Final    BOTTLES DRAWN AEROBIC AND ANAEROBIC Blood Culture adequate volume   Culture   Final    NO GROWTH 5 DAYS Performed at [facility], [address], [place], [place] [address]    Report Status 06/19/2023 FINAL  Final  Expectorated Sputum Assessment w Gram Stain, Rflx to Resp Cult     Status: None   Collection Time: 06/16/23  4:48 AM   Specimen: Expectorated Sputum  Result Value Ref Range Status   Specimen Description EXPECTORATED SPUTUM  Final   Special Requests NONE  Final   Sputum evaluation    Final    THIS SPECIMEN IS ACCEPTABLE FOR SPUTUM CULTURE Performed at [facility], [address] [place], [place] [address]    Report Status 06/16/2023 FINAL  Final  Culture, Respiratory w Gram Stain     Status: None   Collection Time: 06/16/23  4:48 AM  Result Value Ref Range Status   Specimen Description EXPECTORATED SPUTUM  Final   Special Requests NONE Reflexed from [id number]  Final   Gram Stain   Final    FEW WBC PRESENT, PREDOMINANTLY PMN FEW GRAM NEGATIVE RODS FEW GRAM POSITIVE COCCI    Culture   Final    MODERATE Normal respiratory flora-no Staph aureus or Pseudomonas seen Performed at [facility], [address], [place], [place] [address]    Report Status 06/19/2023 FINAL  Final    RADIOLOGY STUDIES/RESULTS: DG Chest Port 1 View Result Date: 06/21/2023 CLINICAL DATA:  Shortness of breath. EXAM: PORTABLE CHEST 1 VIEW COMPARISON:  CT chest and chest x-ray dated June 19, 2023. FINDINGS: Stable cardiomediastinal silhouette with normal heart size. Similar bibasilar consolidation, worse on the left. Bilateral pleural plaques again noted. No pleural effusion or pneumothorax. No acute osseous abnormality. IMPRESSION: 1. Similar bibasilar consolidation, worse on the left, concerning for pneumonia. Electronically Signed   By: Obie Dredge M.D.   On: 06/21/2023 09:58     LOS: 7 days   Jeoffrey Massed, MD  [facility]    To contact the attending provider between 7A-7P or the covering provider during after hours 7P-7A, please log into the web site  [email or web address] and access using universal [place] password for that web site. If you do not have the password, please call the hospital operator.  06/22/2023, 10:04 AM

## 2023-06-22 NOTE — TOC Initial Note (Signed)
 Transition of Care [facility]) - Initial/Assessment Note    Patient Details  Name: Steven Guerrero MRN: [id number] Date of Birth: [birth year]  Transition of Care [facility] CM/SW Contact:    Mearl Latin, LCSW Phone Number: 06/22/2023, 2:30 PM  Clinical Narrative:                 CSW received consult for possible SNF at time of discharge. CSW spoke with patient and spouse at bedside. Patient's spouse reported that he would like home health services instead of SNF as patient would want her to be present at all times anyway. She reported being comfortable taking patient home at his current physical level. She reported no HH preference as long as it is in network with his insurance. Referral sent to [facility] and referral was accepted.   CSW discussed equipment needs and patient may need home Oxygen. Spouse uses [employer] for her oxygen and prefers to use them for his as well. He has a walker, wheelchair, BSC, and adjustable bed at home. Declined hospital bed. CSW confirmed PCP and address.   Expected Discharge Plan: Home w Home Health Services Barriers to Discharge: Continued Medical Work up   Patient Goals and CMS Choice Patient states their goals for this hospitalization and ongoing recovery are:: Return home CMS Medicare.gov Compare Post Acute Care list provided to:: Patient Choice offered to / list presented to : Patient, Spouse  ownership interest in [facility].provided to:: Patient    Expected Discharge Plan and Services In-house Referral: Clinical Social Work Discharge Planning Services: CM Consult Post Acute Care Choice: Durable Medical Equipment, Home Health Living arrangements for the past 2 months: Single Family Home                             HH Agency: [name] Date HH Agency Contacted: 06/22/23 Time HH Agency Contacted: 1429 Representative spoke with at [facility] Agency: [place]  Prior Living Arrangements/Services Living  arrangements for the past 2 months: Single Family Home Lives with:: Spouse Patient language and need for interpreter reviewed:: Yes Do you feel safe going back to the place where you live?: Yes      Need for Family Participation in Patient Care: Yes (Comment) Care giver support system in place?: Yes (comment) Current home services: DME (walker, wc, BSC, adjustable bed) Criminal Activity/Legal Involvement Pertinent to Current Situation/Hospitalization: No - Comment as needed  Activities of Daily Living   ADL Screening (condition at time of admission) Independently performs ADLs?: Yes (appropriate for developmental age) Is the patient deaf or have difficulty hearing?: No Does the patient have difficulty seeing, even when wearing glasses/contacts?: No Does the patient have difficulty concentrating, remembering, or making decisions?: Yes  Permission Sought/Granted Permission sought to share information with : Facility Medical sales representative, Family Supports Permission granted to share information with : Yes, Verbal Permission Granted  Share Information with NAME: [name]  Permission granted to share info w AGENCY: HH  Permission granted to share info w Relationship: Spouse  Permission granted to share info w Contact Information: [phone]  Emotional Assessment Appearance:: Appears stated age Attitude/Demeanor/Rapport: Engaged Affect (typically observed): Accepting, Appropriate Orientation: : Oriented to Self, Oriented to Place, Oriented to  Time, Oriented to Situation Alcohol / Substance Use: Not Applicable Psych Involvement: No (comment)  Admission diagnosis:  CAP (community acquired pneumonia) [J18.9] Community acquired pneumonia, unspecified laterality [J18.9] Thoracic aortic aneurysm without rupture, unspecified part (HCC) [I71.20]  Patient Active Problem List   Diagnosis Date Noted   CAP (community acquired pneumonia) 06/15/2023   MGUS (monoclonal gammopathy of unknown  significance) 10/28/2022   Chronic left shoulder pain 06/04/2022   Chronic bronchitis (HCC) 12/24/2020   COPD with chronic bronchitis and emphysema (HCC) 12/24/2020   Lung nodules 12/24/2020   TIA (transient ischemic attack) 04/02/2018   Essential hypertension 04/02/2018   History of stroke 04/02/2018   History of pulmonary embolus (PE) 04/02/2018   H/O total hip arthroplasty 10/09/2016   Laceration of right forearm 10/09/2016   Long term current use of anticoagulant 02/08/2016   Abnormality of gait 08/28/2015   Head trauma 08/28/2015   Stroke (HCC) 08/28/2015   PCP:  Merri Brunette, MD Pharmacy:   [id number] [place], [place] - [address] [place] [address] Phone: [phone] Fax: [phone]  [facility]) - [place], [place] - [address] [place] [address] [place] [address] Phone: [phone] Fax: [phone]     Social Drivers of Health (SDOH) Social History: SDOH Screenings   Food Insecurity: No Food Insecurity (06/15/2023)  Housing: High Risk (06/15/2023)  Transportation Needs: No Transportation Needs (06/15/2023)  Utilities: Not At Risk (06/15/2023)  Social Connections: Moderately Isolated (06/15/2023)  Tobacco Use: Medium Risk (06/15/2023)   SDOH Interventions:     Readmission Risk Interventions     No data to display

## 2023-06-22 NOTE — Plan of Care (Signed)

## 2023-06-23 DIAGNOSIS — I712 Thoracic aortic aneurysm, without rupture, unspecified: Secondary | ICD-10-CM

## 2023-06-23 DIAGNOSIS — J9601 Acute respiratory failure with hypoxia: Secondary | ICD-10-CM | POA: Diagnosis not present

## 2023-06-23 DIAGNOSIS — J189 Pneumonia, unspecified organism: Secondary | ICD-10-CM | POA: Diagnosis not present

## 2023-06-23 MED ORDER — LINEZOLID 600 MG PO TABS
600.0000 mg | ORAL_TABLET | Freq: Two times a day (BID) | ORAL | Status: AC
  Administered 2023-06-23 – 2023-06-25 (×5): 600 mg via ORAL
  Filled 2023-06-23 (×5): qty 1

## 2023-06-23 NOTE — Progress Notes (Signed)
 Speech Language Pathology Treatment: Dysphagia  Patient Details Name: Steven Guerrero MRN: [id number] DOB: July 09, 1940 Today's Date: 06/23/2023 Time: 4098-1191 SLP Time Calculation (min) (ACUTE ONLY): 10 min  Assessment / Plan / Recommendation Clinical Impression  Steven Guerrero was sitting in recliner, wife at bedside, now tolerating 6L 02.  He was smiling and interactive today; is handling POs well.  Drank thin liquids independently with no cueing needed during my visit. He will continue to benefit from crushing meds in puree. Continue current diet - doubt he is aspirating.  Pt/wife agree with plan.    HPI HPI: Steven Guerrero is a 83 y.o. male with medical history significant for previous CVA in 2001, TBI in 2003, history of PE on Xarelto, OSA not on CPAP, CKD 3B, GERD, COPD/emphysema, chronic bronchitis, subcentimeter lung nodules followed by pulmonary, presents to the ER from home via EMS with complaints of sudden onset shortness of breath, productive cough, and wheezing today.  Associated with 1 episode of vomiting.  EMS was activated.  Upon EMS assessment, the patient was found to be febrile with a Tmax 103.  Patient took Tylenol prior to arrival to the ER.  En route, he received albuterol nebs, Atrovent nebs, IV Solu-Medrol 125 mg x 1.  Per his wife he has had episodes of chocking on his food.  Eats a soft diet with small bites.  In the ER, respiratory status appeared to be improving.  CT chest abdomen pelvis revealed bibasilar infiltrates with concern for developing pneumonia.      SLP Plan  Continue with current plan of care      Recommendations for follow up therapy are one component of a multi-disciplinary discharge planning process, led by the attending physician.  Recommendations may be updated based on patient status, additional functional criteria and insurance authorization.    Recommendations  Diet recommendations: Dysphagia 3 (mechanical soft);Thin liquid Liquids provided via:  Cup;Straw Medication Administration: Crushed with puree Supervision: Patient able to self feed;Staff to assist with self feeding Compensations: Small sips/bites;Slow rate Postural Changes and/or Swallow Maneuvers: Seated upright 90 degrees;Upright 30-60 min after meal                  Oral care BID;Oral care prior to ice chip/H20     Dysphagia, oropharyngeal phase (R13.12)     Continue with current plan of care    Steven Guerrero L. Steven Frederic, MA CCC/SLP Clinical Specialist - Acute Care SLP Acute Rehabilitation Services Office number [phone]  Steven Guerrero Steven Guerrero  06/23/2023, 3:53 PM

## 2023-06-23 NOTE — Progress Notes (Signed)
 PROGRESS NOTE        PATIENT DETAILS Name: Steven Guerrero Age: 83 y.o. Sex: male Date of Birth: 04-Mar-1941 Admit Date: 06/14/2023 Admitting Physician Darlin Drop, DO ZOX:[id number], Zollie Beckers, MD  Brief Summary: Patient is a 83 y.o.  male with history of VTE on Xarelto, COPD, CVA, chronic dysphagia, OSA not on CPAP-who presented with shortness of breath/cough/wheezing and 1 episode of vomiting-patient was found to have acute hypoxic respiratory failure secondary pneumonia and subsequently admitted to the hospitalist service.  Significant events: 3/23>> admitted TRH-SOB-presumed pneumonia.  Significant studies: 3/23>> CT chest/abdomen/pelvis: Developing infiltrates in lung bases. 3/28>> CT chest: Complete middle lobe collapse-bibasilar consolidation.  Significant microbiology data: 3/23>> COVID/influenza/RSV PCR: Negative 3/23>> blood culture: No growth 3/25>> sputum culture: Normal respiratory flora  Procedures: None  Consults: Palliative care  Subjective: No major issues overnight-slowly improving-Down to 6 L of HFNC.  Coughing quite a bit when using incentive spirometry/flutter valve.  Objective: Vitals: Blood pressure 121/73, pulse 79, temperature 97.9 F (36.6 C), temperature source Oral, resp. rate (!) 23, height 5\' 11"  (1.803 m), weight 77.6 kg, SpO2 94%.   Exam: Gen Exam:Alert awake-not in any distress HEENT:atraumatic, normocephalic Chest: B/L clear to auscultation anteriorly CVS:S1S2 regular Abdomen:soft non tender, non distended Extremities:no edema Neurology: Non focal Skin: no rash  Pertinent Labs/Radiology:    Latest Ref Rng & Units 06/22/2023    4:34 AM 06/21/2023    5:52 AM 06/20/2023    5:00 AM  CBC  WBC 4.0 - 10.5 K/uL 6.5  7.4  10.8   Hemoglobin 13.0 - 17.0 g/dL 54.0  98.1  19.1   Hematocrit 39.0 - 52.0 % 34.7  30.3  32.4   Platelets 150 - 400 K/uL 203  181  172     Lab Results  Component Value Date   NA 137 06/22/2023    K 3.5 06/22/2023   CL 99 06/22/2023   CO2 28 06/22/2023      Assessment/Plan: Severe sepsis with acute hypoxic respiratory failure secondary to aspiration pneumonia Slowly improving-stable on just 6 L of HFNC-previously was on heated high flow Volume status stable-suspect does not require any diuretics today Continue Zyvox/Zosyn x 7 days total Continue attempts to use incentive spirometry/flutter valve/pulmonary toileting Continue to mobilize as much as possible Thankfully improving-but patient/family aware that if he were to deteriorate-apart from initiation of comfort measures-no other options. Palliative care following.    Dysphagia Chronic issue-evaluated by SLP-on dysphagia 3 diet.  History of VTE On Xarelto  CKD stage IIIb At baseline Follow electrolytes periodically  COPD Scheduled bronchodilators  OSA CPAP nightly  HLD Statin  HTN BP stable Continue amlodipine Resume losartan when able.  BPH Flomax  4.4 cm thoracic ascending aortic aneurysm Incidental finding on CT imaging. Radiology recommending annual CTA/MRA.  BMI: Estimated body mass index is 23.86 kg/m as calculated from the following:   Height as of this encounter: 5\' 11"  (1.803 m).   Weight as of this encounter: 77.6 kg.   Code status:   Code Status: Limited: Do not attempt resuscitation (DNR) -DNR-LIMITED -Do Not Intubate/DNI    DVT Prophylaxis: rivaroxaban (XARELTO) tablet 10 mg Start: 06/15/23 1000 rivaroxaban (XARELTO) tablet 10 mg    Family Communication: Spouse at bedside   Disposition Plan: Status is: Inpatient Remains inpatient appropriate because: Severity of illness   Planned Discharge Destination:Home  Diet: Diet Order             DIET DYS 3 Room service appropriate? Yes with Assist; Fluid consistency: Thin  Diet effective now                     Antimicrobial agents: Anti-infectives (From admission, onward)    Start     Dose/Rate Route Frequency Ordered  Stop   06/19/23 1000  linezolid (ZYVOX) IVPB 600 mg        600 mg 300 mL/hr over 60 Minutes Intravenous Every 12 hours 06/19/23 0854 06/25/23 2359   06/19/23 0945  piperacillin-tazobactam (ZOSYN) IVPB 3.375 g        3.375 g 12.5 mL/hr over 240 Minutes Intravenous Every 8 hours 06/19/23 0854 06/25/23 2359   06/17/23 1000  Ampicillin-Sulbactam (UNASYN) 3 g in sodium chloride 0.9 % 100 mL IVPB  Status:  Discontinued        3 g 200 mL/hr over 30 Minutes Intravenous Every 6 hours 06/17/23 0911 06/19/23 1025   06/15/23 2230  azithromycin (ZITHROMAX) 500 mg in sodium chloride 0.9 % 250 mL IVPB  Status:  Discontinued        500 mg 250 mL/hr  Intravenous Every 24 hours 06/15/23 0410 06/17/23 0911   06/15/23 2200  cefTRIAXone (ROCEPHIN) 2 g in sodium chloride 0.9 % 100 mL IVPB  Status:  Discontinued        2 g 200 mL/hr over 30 Minutes Intravenous Every 24 hours 06/15/23 0410 06/17/23 0911   06/14/23 2330  cefTRIAXone (ROCEPHIN) 2 g in sodium chloride 0.9 % 100 mL IVPB        2 g 200 mL/hr over 30 Minutes Intravenous  Once 06/14/23 2326 06/15/23 0025   06/14/23 2330  azithromycin (ZITHROMAX) 500 mg in sodium chloride 0.9 % 250 mL IVPB        500 mg 250 mL/hr over 60 Minutes Intravenous  Once 06/14/23 2326 06/15/23 0056        MEDICATIONS: Scheduled Meds:  amLODipine  5 mg Oral Daily   arformoterol  15 mcg Nebulization BID   atorvastatin  20 mg Oral Daily   budesonide (PULMICORT) nebulizer solution  0.25 mg Nebulization BID   citalopram  20 mg Oral QHS   cyanocobalamin  1,000 mcg Oral Daily   guaiFENesin  600 mg Oral BID   levalbuterol  0.63 mg Nebulization BID   And   ipratropium  0.5 mg Nebulization BID   multivitamin with minerals  1 tablet Oral Daily   pantoprazole  40 mg Oral Daily   revefenacin  175 mcg Nebulization Daily   rivaroxaban  10 mg Oral Daily   tamsulosin  0.4 mg Oral Daily   Continuous Infusions:  linezolid (ZYVOX) IV 600 mg (06/23/23 0832)    piperacillin-tazobactam (ZOSYN)  IV 3.375 g (06/23/23 0833)   PRN Meds:.acetaminophen, hydrALAZINE, melatonin, menthol-cetylpyridinium, mouth rinse, phenol, polyethylene glycol, prochlorperazine, simethicone   I have personally reviewed following labs and imaging studies  LABORATORY DATA: CBC: Recent Labs  Lab 06/19/23 0600 06/20/23 0500 06/21/23 0552 06/22/23 0434  WBC 12.2* 10.8* 7.4 6.5  HGB 12.0* 11.1* 10.3* 12.0*  HCT 35.1* 32.4* 30.3* 34.7*  MCV 94.9 94.5 94.4 94.0  PLT 178 172 181 203    Basic Metabolic Panel: Recent Labs  Lab 06/18/23 0454 06/19/23 0600 06/20/23 0500 06/21/23 0552 06/22/23 0434  NA 139 140 137 135 137  K 2.9* 3.8 3.7 3.2* 3.5  CL 101 103 100  99 99  CO2 26 29 29 26 28   GLUCOSE 108* 131* 131* 84 105*  BUN 18 17 17 14 12   CREATININE 1.48* 1.63* 1.62* 1.52* 1.45*  CALCIUM 8.2* 8.1* 8.3* 8.1* 8.8*  MG  --   --   --   --  1.9    GFR: Estimated Creatinine Clearance: 41.1 mL/min (A) (by C-G formula based on SCr of 1.45 mg/dL (H)).  Liver Function Tests: Recent Labs  Lab 06/21/23 0552  AST 68*  ALT 68*  ALKPHOS 54  BILITOT 1.1  PROT 5.8*  ALBUMIN 2.3*   No results for input(s): "LIPASE", "AMYLASE" in the last 168 hours. No results for input(s): "AMMONIA" in the last 168 hours.  Coagulation Profile: No results for input(s): "INR", "PROTIME" in the last 168 hours.   Cardiac Enzymes: No results for input(s): "CKTOTAL", "CKMB", "CKMBINDEX", "TROPONINI" in the last 168 hours.  BNP (last 3 results) No results for input(s): "PROBNP" in the last 8760 hours.  Lipid Profile: No results for input(s): "CHOL", "HDL", "LDLCALC", "TRIG", "CHOLHDL", "LDLDIRECT" in the last 72 hours.  Thyroid Function Tests: No results for input(s): "TSH", "T4TOTAL", "FREET4", "T3FREE", "THYROIDAB" in the last 72 hours.  Anemia Panel: No results for input(s): "VITAMINB12", "FOLATE", "FERRITIN", "TIBC", "IRON", "RETICCTPCT" in the last 72 hours.  Urine  analysis:    Component Value Date/Time   COLORURINE YELLOW 06/15/2023 0309   APPEARANCEUR CLEAR 06/15/2023 0309   LABSPEC 1.010 06/15/2023 0309   PHURINE 5.5 06/15/2023 0309   GLUCOSEU NEGATIVE 06/15/2023 0309   HGBUR NEGATIVE 06/15/2023 0309   BILIRUBINUR NEGATIVE 06/15/2023 0309   KETONESUR NEGATIVE 06/15/2023 0309   PROTEINUR NEGATIVE 06/15/2023 0309   UROBILINOGEN 0.2 07/14/2009 1843   NITRITE NEGATIVE 06/15/2023 0309   LEUKOCYTESUR NEGATIVE 06/15/2023 0309    Sepsis Labs: Lactic Acid, Venous    Component Value Date/Time   LATICACIDVEN 0.9 06/17/2023 0431    MICROBIOLOGY: Recent Results (from the past 240 hours)  Blood Culture (routine x 2)     Status: None   Collection Time: 06/14/23  8:06 PM   Specimen: BLOOD  Result Value Ref Range Status   Specimen Description BLOOD RIGHT ARM  Final   Special Requests   Final    BOTTLES DRAWN AEROBIC AND ANAEROBIC Blood Culture adequate volume   Culture   Final    NO GROWTH 5 DAYS Performed at [facility], [address], [place], [place] [address]    Report Status 06/19/2023 FINAL  Final  Resp panel by RT-PCR (RSV, Flu A&B, Covid) Anterior Nasal Swab     Status: None   Collection Time: 06/14/23  8:27 PM   Specimen: Anterior Nasal Swab  Result Value Ref Range Status   SARS Coronavirus 2 by RT PCR NEGATIVE NEGATIVE Final   Influenza A by PCR NEGATIVE NEGATIVE Final   Influenza B by PCR NEGATIVE NEGATIVE Final    Comment: (NOTE) The Xpert Xpress SARS-CoV-2/FLU/RSV plus assay is intended as an aid in the diagnosis of influenza from Nasopharyngeal swab specimens and should not be used as a sole basis for treatment. Nasal washings and aspirates are unacceptable for Xpert Xpress SARS-CoV-2/FLU/RSV testing.  Fact Sheet for Patients: BloggerCourse.com  Fact Sheet for Healthcare Providers: SeriousBroker.it  This test is not yet approved or cleared by the [place] FDA  and has been authorized for detection and/or diagnosis of SARS-CoV-2 by FDA under an Emergency Use Authorization (EUA). This EUA will remain in effect (meaning this test can be  used) for the duration of the COVID-19 declaration under Section 564(b)(1) of the Act, 21 U.S.C. section 360bbb-3(b)(1), unless the authorization is terminated or revoked.     Resp Syncytial Virus by PCR NEGATIVE NEGATIVE Final    Comment: (NOTE) Fact Sheet for Patients: BloggerCourse.com  Fact Sheet for Healthcare Providers: SeriousBroker.it  This test is not yet approved or cleared by the [place] FDA and has been authorized for detection and/or diagnosis of SARS-CoV-2 by FDA under an Emergency Use Authorization (EUA). This EUA will remain in effect (meaning this test can be used) for the duration of the COVID-19 declaration under Section 564(b)(1) of the Act, 21 U.S.C. section 360bbb-3(b)(1), unless the authorization is terminated or revoked.  Performed at [facility], [address] [place], [place] [address]   Blood Culture (routine x 2)     Status: None   Collection Time: 06/14/23  8:30 PM   Specimen: BLOOD  Result Value Ref Range Status   Specimen Description BLOOD LEFT ARM  Final   Special Requests   Final    BOTTLES DRAWN AEROBIC AND ANAEROBIC Blood Culture adequate volume   Culture   Final    NO GROWTH 5 DAYS Performed at [facility], [address] [place], [place] [address]    Report Status 06/19/2023 FINAL  Final  Expectorated Sputum Assessment w Gram Stain, Rflx to Resp Cult     Status: None   Collection Time: 06/16/23  4:48 AM   Specimen: Expectorated Sputum  Result Value Ref Range Status   Specimen Description EXPECTORATED SPUTUM  Final   Special Requests NONE  Final   Sputum evaluation   Final    THIS SPECIMEN IS ACCEPTABLE FOR SPUTUM CULTURE Performed at [facility], [address] [place],  [place] [address]    Report Status 06/16/2023 FINAL  Final  Culture, Respiratory w Gram Stain     Status: None   Collection Time: 06/16/23  4:48 AM  Result Value Ref Range Status   Specimen Description EXPECTORATED SPUTUM  Final   Special Requests NONE Reflexed from [id number]  Final   Gram Stain   Final    FEW WBC PRESENT, PREDOMINANTLY PMN FEW GRAM NEGATIVE RODS FEW GRAM POSITIVE COCCI    Culture   Final    MODERATE Normal respiratory flora-no Staph aureus or Pseudomonas seen Performed at [facility], [address], [place], [place] [address]    Report Status 06/19/2023 FINAL  Final    RADIOLOGY STUDIES/RESULTS: No results found.    LOS: 8 days   Jeoffrey Massed, MD  [facility]    To contact the attending provider between 7A-7P or the covering provider during after hours 7P-7A, please log into the web site [email or web address] and access using universal [place] password for that web site. If you do not have the password, please call the hospital operator.  06/23/2023, 11:49 AM

## 2023-06-24 DIAGNOSIS — J9601 Acute respiratory failure with hypoxia: Secondary | ICD-10-CM | POA: Diagnosis not present

## 2023-06-24 DIAGNOSIS — I712 Thoracic aortic aneurysm, without rupture, unspecified: Secondary | ICD-10-CM | POA: Diagnosis not present

## 2023-06-24 DIAGNOSIS — Z7189 Other specified counseling: Secondary | ICD-10-CM | POA: Diagnosis not present

## 2023-06-24 DIAGNOSIS — J189 Pneumonia, unspecified organism: Secondary | ICD-10-CM | POA: Diagnosis not present

## 2023-06-24 DIAGNOSIS — Z515 Encounter for palliative care: Secondary | ICD-10-CM | POA: Diagnosis not present

## 2023-06-24 LAB — BASIC METABOLIC PANEL WITH GFR
Anion gap: 11 (ref 5–15)
BUN: 13 mg/dL (ref 8–23)
CO2: 25 mmol/L (ref 22–32)
Calcium: 8.4 mg/dL — ABNORMAL LOW (ref 8.9–10.3)
Chloride: 98 mmol/L (ref 98–111)
Creatinine, Ser: 1.56 mg/dL — ABNORMAL HIGH (ref 0.61–1.24)
GFR, Estimated: 44 mL/min — ABNORMAL LOW (ref >60)
Glucose, Bld: 92 mg/dL (ref 70–99)
Potassium: 2.8 mmol/L — ABNORMAL LOW (ref 3.5–5.1)
Sodium: 134 mmol/L — ABNORMAL LOW (ref 135–145)

## 2023-06-24 LAB — CBC
HCT: 31.6 % — ABNORMAL LOW (ref 39.0–52.0)
Hemoglobin: 10.8 g/dL — ABNORMAL LOW (ref 13.0–17.0)
MCH: 31.8 pg (ref 26.0–34.0)
MCHC: 34.2 g/dL (ref 30.0–36.0)
MCV: 92.9 fL (ref 80.0–100.0)
Platelets: 223 10*3/uL (ref 150–400)
RBC: 3.4 MIL/uL — ABNORMAL LOW (ref 4.22–5.81)
RDW: 12.8 % (ref 11.5–15.5)
WBC: 6.3 10*3/uL (ref 4.0–10.5)
nRBC: 0 % (ref 0.0–0.2)

## 2023-06-24 MED ORDER — PANTOPRAZOLE SODIUM 40 MG IV SOLR
40.0000 mg | INTRAVENOUS | Status: DC
Start: 2023-06-24 — End: 2023-06-26
  Administered 2023-06-24 – 2023-06-25 (×2): 40 mg via INTRAVENOUS
  Filled 2023-06-24 (×2): qty 10

## 2023-06-24 MED ORDER — POTASSIUM CHLORIDE CRYS ER 20 MEQ PO TBCR
40.0000 meq | EXTENDED_RELEASE_TABLET | ORAL | Status: AC
Start: 2023-06-24 — End: 2023-06-24
  Administered 2023-06-24 (×2): 40 meq via ORAL
  Filled 2023-06-24 (×2): qty 2

## 2023-06-24 MED ORDER — POTASSIUM CHLORIDE CRYS ER 20 MEQ PO TBCR: 60.0000 meq | EXTENDED_RELEASE_TABLET | Freq: Once | ORAL | Status: DC

## 2023-06-24 MED ORDER — GUAIFENESIN 100 MG/5ML PO LIQD
15.0000 mL | Freq: Four times a day (QID) | ORAL | Status: DC
Start: 2023-06-24 — End: 2023-06-26
  Administered 2023-06-24 – 2023-06-26 (×9): 15 mL via ORAL
  Filled 2023-06-24 (×10): qty 15

## 2023-06-24 NOTE — Plan of Care (Signed)

## 2023-06-24 NOTE — [place] FL2 (Signed)
 [place] MEDICAID FL2 LEVEL OF CARE FORM     IDENTIFICATION  Patient Name: Steven Guerrero Birthdate: 1940-07-08 Sex: male Admission Date (Current Location): 06/14/2023  [facility] and [place] Number:  Producer, television/film/video and Address:  [facility], [address] [place], [place] [address]      Provider Number: [id number]  Attending Physician Name and Address:  Maretta Bees, MD  Relative Name and Phone Number:       Current Level of Care: Hospital Recommended Level of Care: Skilled Nursing Facility Prior Approval Number:    Date Approved/Denied:   PASRR Number: [id number]  Discharge Plan: SNF    Current Diagnoses: Patient Active Problem List   Diagnosis Date Noted   Thoracic aortic aneurysm without rupture (HCC) 06/23/2023   Acute respiratory failure with hypoxia (HCC) 06/23/2023   CAP (community acquired pneumonia) 06/15/2023   MGUS (monoclonal gammopathy of unknown significance) 10/28/2022   Chronic left shoulder pain 06/04/2022   Chronic bronchitis (HCC) 12/24/2020   COPD with chronic bronchitis and emphysema (HCC) 12/24/2020   Lung nodules 12/24/2020   TIA (transient ischemic attack) 04/02/2018   Essential hypertension 04/02/2018   History of stroke 04/02/2018   History of pulmonary embolus (PE) 04/02/2018   H/O total hip arthroplasty 10/09/2016   Laceration of right forearm 10/09/2016   Long term current use of anticoagulant 02/08/2016   Abnormality of gait 08/28/2015   Head trauma 08/28/2015   Stroke (HCC) 08/28/2015    Orientation RESPIRATION BLADDER Height & Weight     Self, Time, Situation, Place  O2 (4L nasal cannula) Incontinent, External catheter Weight: 171 lb 1.2 oz (77.6 kg) Height:  5\' 11"  (180.3 cm)  BEHAVIORAL SYMPTOMS/MOOD NEUROLOGICAL BOWEL NUTRITION STATUS      Continent Diet (See dc summary)  AMBULATORY STATUS COMMUNICATION OF NEEDS Skin   Extensive Assist Verbally Normal                        Personal Care Assistance Level of Assistance  Bathing, Feeding, Dressing Bathing Assistance: Maximum assistance Feeding assistance: Independent Dressing Assistance: Maximum assistance     Functional Limitations Info             SPECIAL CARE FACTORS FREQUENCY  PT (By licensed PT), OT (By licensed OT)     PT Frequency: 5x/week OT Frequency: 5x/week            Contractures Contractures Info: Not present    Additional Factors Info  Code Status, Allergies Code Status Info: DNR Allergies Info: Atenolol, Gabapentin, Lotensin (Benazepril Hcl), Wellbutrin (Bupropion)           Current Medications (06/24/2023):  This is the current hospital active medication list Current Facility-Administered Medications  Medication Dose Route Frequency Provider Last Rate Last Admin   acetaminophen (TYLENOL) tablet 650 mg  650 mg Oral Q6H PRN Dow Adolph N, DO   650 mg at 06/23/23 0746   amLODipine (NORVASC) tablet 5 mg  5 mg Oral Daily [place], Alvira Philips, DO   5 mg at 06/24/23 0954   arformoterol (BROVANA) nebulizer solution 15 mcg  15 mcg Nebulization BID Maretta Bees, MD   15 mcg at 06/24/23 0753   atorvastatin (LIPITOR) tablet 20 mg  20 mg Oral Daily Dow Adolph N, DO   20 mg at 06/24/23 0954   budesonide (PULMICORT) nebulizer solution 0.25 mg  0.25 mg Nebulization BID Maretta Bees, MD   0.25 mg at 06/24/23  0757   citalopram (CELEXA) tablet 20 mg  20 mg Oral QHS [place], Alvira Philips, DO   20 mg at 06/23/23 2049   cyanocobalamin (VITAMIN B12) tablet 1,000 mcg  1,000 mcg Oral Daily Dow Adolph N, DO   1,000 mcg at 06/24/23 1610   guaiFENesin (MUCINEX) 12 hr tablet 600 mg  600 mg Oral BID Maretta Bees, MD   600 mg at 06/23/23 2049   hydrALAZINE (APRESOLINE) tablet 25 mg  25 mg Oral Q6H PRN [place], Alvira Philips, DO       linezolid (ZYVOX) tablet 600 mg  600 mg Oral Q12H Paytes, Austin A, RPH   600 mg at 06/24/23 9604   melatonin tablet 5 mg  5 mg Oral QHS PRN Dow Adolph N, DO   5 mg  at 06/21/23 2126   menthol-cetylpyridinium (CEPACOL) lozenge 3 mg  1 lozenge Oral PRN [place], Eric J, DO       multivitamin with minerals tablet 1 tablet  1 tablet Oral Daily Darlin Drop, DO   1 tablet at 06/24/23 5409   Oral care mouth rinse  15 mL Mouth Rinse PRN [place], Eric J, DO       pantoprazole (PROTONIX) EC tablet 40 mg  40 mg Oral Daily Dow Adolph N, DO   40 mg at 06/23/23 0746   phenol (CHLORASEPTIC) mouth spray 1 spray  1 spray Mouth/Throat PRN Dow Adolph N, DO       piperacillin-tazobactam (ZOSYN) IVPB 3.375 g  3.375 g Intravenous Q8H Ghimire, Shanker M, MD 12.5 mL/hr at 06/24/23 0957 3.375 g at 06/24/23 0957   polyethylene glycol (MIRALAX / GLYCOLAX) packet 17 g  17 g Oral Daily PRN Dow Adolph N, DO   17 g at 06/19/23 1334   potassium chloride SA (KLOR-CON M) CR tablet 40 mEq  40 mEq Oral Q4H Maretta Bees, MD   40 mEq at 06/24/23 0954   prochlorperazine (COMPAZINE) injection 5 mg  5 mg Intravenous Q6H PRN Dow Adolph N, DO   5 mg at 06/18/23 1741   revefenacin (YUPELRI) nebulizer solution 175 mcg  175 mcg Nebulization Daily Maretta Bees, MD   175 mcg at 06/24/23 0753   rivaroxaban (XARELTO) tablet 10 mg  10 mg Oral Daily Dow Adolph N, DO   10 mg at 06/24/23 0957   simethicone (MYLICON) chewable tablet 80 mg  80 mg Oral Q6H PRN Howerter, Justin B, DO   80 mg at 06/16/23 0445   tamsulosin (FLOMAX) capsule 0.4 mg  0.4 mg Oral Daily Dow Adolph N, DO   0.4 mg at 06/24/23 8119     Discharge Medications: Please see discharge summary for a list of discharge medications.  Relevant Imaging Results:  Relevant Lab Results:   Additional Information SSn: [id number] [place], [place]

## 2023-06-24 NOTE — TOC Progression Note (Signed)
 Transition of Care [facility]) - Progression Note    Patient Details  Name: Steven Guerrero MRN: [id number] Date of Birth: 06/27/1940  [facility] CM/SW Contact  Mearl Latin, LCSW Phone Number: 06/24/2023, 3:08 PM  Clinical Narrative:    CSW met with patient and family at bedside to provide SNF bed offers as patient is now in agreement with going to rehab. They will review bed offers and let CSW know choice.    Expected Discharge Plan: Skilled Nursing Facility Barriers to Discharge: Continued Medical Work up, SNF Pending bed offer, English as a second language teacher  Expected Discharge Plan and Services In-house Referral: Clinical Social Work Discharge Planning Services: CM Consult Post Acute Care Choice: [name], commercial, Home Health Living arrangements for the past 2 months: Single Family Home                             HH [facility]: [name] Date HH Agency Contacted: 06/22/23 Time HH Agency Contacted: 1429 Representative spoke with at [facility] Agency: [name]   Social Determinants of Health (SDOH) Interventions SDOH Screenings   Food Insecurity: No Food Insecurity (06/15/2023)  Housing: High Risk (06/15/2023)  Transportation Needs: No Transportation Needs (06/15/2023)  Utilities: Not At Risk (06/15/2023)  Social Connections: Moderately Isolated (06/15/2023)  Tobacco Use: Medium Risk (06/15/2023)    Readmission Risk Interventions     No data to display

## 2023-06-24 NOTE — Progress Notes (Addendum)
 PROGRESS NOTE        PATIENT DETAILS Name: Steven Guerrero San Age: 83 y.o. Sex: male Date of Birth: 05/29/1940 Admit Date: 06/14/2023 Admitting Physician Darlin Drop, DO [id number], Zollie Beckers, MD  Brief Summary: Patient is a 83 y.o.  male with history of VTE on Xarelto, COPD, CVA, chronic dysphagia, OSA not on CPAP-who presented with shortness of breath/cough/wheezing and 1 episode of vomiting-patient was found to have acute hypoxic respiratory failure secondary pneumonia and subsequently admitted to the hospitalist service.  Significant events: 3/23>> admitted TRH-SOB-presumed pneumonia.  Significant studies: 3/23>> CT chest/abdomen/pelvis: Developing infiltrates in lung bases. 3/28>> CT chest: Complete middle lobe collapse-bibasilar consolidation.  Significant microbiology data: 3/23>> COVID/influenza/RSV PCR: Negative 3/23>> blood culture: No growth 3/25>> sputum culture: Normal respiratory flora  Procedures: None  Consults: Palliative care  Subjective: Continues to improve-down to 3 L of oxygen today.  Coughing.  Daughter at bedside.  No other issues overnight.  Objective: Vitals: Blood pressure 126/69, pulse 79, temperature 98.4 F (36.9 C), temperature source Oral, resp. rate (!) 22, height 5\' 11"  (1.803 m), weight 77.6 kg, SpO2 93%.   Exam: Gen Exam:Alert awake-not in any distress HEENT:atraumatic, normocephalic Chest: B/L clear to auscultation anteriorly CVS:S1S2 regular Abdomen:soft non tender, non distended Extremities:no edema Neurology: Non focal Skin: no rash  Pertinent Labs/Radiology:    Latest Ref Rng & Units 06/24/2023    4:50 AM 06/22/2023    4:34 AM 06/21/2023    5:52 AM  CBC  WBC 4.0 - 10.5 K/uL 6.3  6.5  7.4   Hemoglobin 13.0 - 17.0 g/dL 66.4  40.3  47.4   Hematocrit 39.0 - 52.0 % 31.6  34.7  30.3   Platelets 150 - 400 K/uL 223  203  181     Lab Results  Component Value Date   NA 134 (L) 06/24/2023   K 2.8 (L)  06/24/2023   CL 98 06/24/2023   CO2 25 06/24/2023      Assessment/Plan: Severe sepsis with acute hypoxic respiratory failure secondary to aspiration pneumonia Significantly better-Down to 3 L of oxygen today ( Previously on heated high flow)  Continue Zyvox/Zosyn-7 days total Volume status stable-does not require diuretics Mobilize/incentive spirometry/flutter valve use Suspect will require oxygen on discharge DNR-palliative care following-thankfully has improved but if worsens-apart from hospice really no other options.   Dysphagia Chronic issue-evaluated by SLP-on dysphagia 3 diet.  History of VTE On Xarelto  CKD stage IIIb At baseline Follow electrolytes periodically  Hypokalemia Replete/recheck  COPD Scheduled bronchodilators  OSA CPAP nightly  HLD Statin  HTN BP stable Continue amlodipine Resume losartan when able.  BPH Flomax  4.4 cm thoracic ascending aortic aneurysm Incidental finding on CT imaging. Radiology recommending annual CTA/MRA.  BMI: Estimated body mass index is 23.86 kg/m as calculated from the following:   Height as of this encounter: 5\' 11"  (1.803 m).   Weight as of this encounter: 77.6 kg.   Code status:   Code Status: Limited: Do not attempt resuscitation (DNR) -DNR-LIMITED -Do Not Intubate/DNI    DVT Prophylaxis: rivaroxaban (XARELTO) tablet 10 mg Start: 06/15/23 1000 rivaroxaban (XARELTO) tablet 10 mg    Family Communication: Daughter at bedside   Disposition Plan: Status is: Inpatient Remains inpatient appropriate because: Severity of illness   Planned Discharge Destination:Home   Diet: Diet Order  DIET DYS 3 Room service appropriate? Yes with Assist; Fluid consistency: Thin  Diet effective now                     Antimicrobial agents: Anti-infectives (From admission, onward)    Start     Dose/Rate Route Frequency Ordered Stop   06/23/23 2200  linezolid (ZYVOX) tablet 600 mg        600 mg  Oral Every 12 hours 06/23/23 1157 06/26/23 0959   06/19/23 1000  linezolid (ZYVOX) IVPB 600 mg  Status:  Discontinued        600 mg 300 mL/hr over 60 Minutes Intravenous Every 12 hours 06/19/23 0854 06/23/23 1157   06/19/23 0945  piperacillin-tazobactam (ZOSYN) IVPB 3.375 g        3.375 g 12.5 mL/hr over 240 Minutes Intravenous Every 8 hours 06/19/23 0854 06/25/23 2359   06/17/23 1000  Ampicillin-Sulbactam (UNASYN) 3 g in sodium chloride 0.9 % 100 mL IVPB  Status:  Discontinued        3 g 200 mL/hr over 30 Minutes Intravenous Every 6 hours 06/17/23 0911 06/19/23 1025   06/15/23 2230  azithromycin (ZITHROMAX) 500 mg in sodium chloride 0.9 % 250 mL IVPB  Status:  Discontinued        500 mg 250 mL/hr  Intravenous Every 24 hours 06/15/23 0410 06/17/23 0911   06/15/23 2200  cefTRIAXone (ROCEPHIN) 2 g in sodium chloride 0.9 % 100 mL IVPB  Status:  Discontinued        2 g 200 mL/hr over 30 Minutes Intravenous Every 24 hours 06/15/23 0410 06/17/23 0911   06/14/23 2330  cefTRIAXone (ROCEPHIN) 2 g in sodium chloride 0.9 % 100 mL IVPB        2 g 200 mL/hr over 30 Minutes Intravenous  Once 06/14/23 2326 06/15/23 0025   06/14/23 2330  azithromycin (ZITHROMAX) 500 mg in sodium chloride 0.9 % 250 mL IVPB        500 mg 250 mL/hr over 60 Minutes Intravenous  Once 06/14/23 2326 06/15/23 0056        MEDICATIONS: Scheduled Meds:  amLODipine  5 mg Oral Daily   arformoterol  15 mcg Nebulization BID   atorvastatin  20 mg Oral Daily   budesonide (PULMICORT) nebulizer solution  0.25 mg Nebulization BID   citalopram  20 mg Oral QHS   cyanocobalamin  1,000 mcg Oral Daily   guaiFENesin  15 mL Oral Q6H   linezolid  600 mg Oral Q12H   multivitamin with minerals  1 tablet Oral Daily   pantoprazole (PROTONIX) IV  40 mg Intravenous Q24H   potassium chloride  40 mEq Oral Q4H   revefenacin  175 mcg Nebulization Daily   rivaroxaban  10 mg Oral Daily   tamsulosin  0.4 mg Oral Daily   Continuous Infusions:   piperacillin-tazobactam (ZOSYN)  IV 3.375 g (06/24/23 0957)   PRN Meds:.acetaminophen, hydrALAZINE, melatonin, menthol-cetylpyridinium, mouth rinse, phenol, polyethylene glycol, prochlorperazine, simethicone   I have personally reviewed following labs and imaging studies  LABORATORY DATA: CBC: Recent Labs  Lab 06/19/23 0600 06/20/23 0500 06/21/23 0552 06/22/23 0434 06/24/23 0450  WBC 12.2* 10.8* 7.4 6.5 6.3  HGB 12.0* 11.1* 10.3* 12.0* 10.8*  HCT 35.1* 32.4* 30.3* 34.7* 31.6*  MCV 94.9 94.5 94.4 94.0 92.9  PLT 178 172 181 203 223    Basic Metabolic Panel: Recent Labs  Lab 06/19/23 0600 06/20/23 0500 06/21/23 0552 06/22/23 0434 06/24/23 0450  NA 140 137 135  137 134*  K 3.8 3.7 3.2* 3.5 2.8*  CL 103 100 99 99 98  CO2 29 29 26 28 25   GLUCOSE 131* 131* 84 105* 92  BUN 17 17 14 12 13   CREATININE 1.63* 1.62* 1.52* 1.45* 1.56*  CALCIUM 8.1* 8.3* 8.1* 8.8* 8.4*  MG  --   --   --  1.9  --     GFR: Estimated Creatinine Clearance: 38.2 mL/min (A) (by C-G formula based on SCr of 1.56 mg/dL (H)).  Liver Function Tests: Recent Labs  Lab 06/21/23 0552  AST 68*  ALT 68*  ALKPHOS 54  BILITOT 1.1  PROT 5.8*  ALBUMIN 2.3*   No results for input(s): "LIPASE", "AMYLASE" in the last 168 hours. No results for input(s): "AMMONIA" in the last 168 hours.  Coagulation Profile: No results for input(s): "INR", "PROTIME" in the last 168 hours.   Cardiac Enzymes: No results for input(s): "CKTOTAL", "CKMB", "CKMBINDEX", "TROPONINI" in the last 168 hours.  BNP (last 3 results) No results for input(s): "PROBNP" in the last 8760 hours.  Lipid Profile: No results for input(s): "CHOL", "HDL", "LDLCALC", "TRIG", "CHOLHDL", "LDLDIRECT" in the last 72 hours.  Thyroid Function Tests: No results for input(s): "TSH", "T4TOTAL", "FREET4", "T3FREE", "THYROIDAB" in the last 72 hours.  Anemia Panel: No results for input(s): "VITAMINB12", "FOLATE", "FERRITIN", "TIBC", "IRON", "RETICCTPCT"  in the last 72 hours.  Urine analysis:    Component Value Date/Time   COLORURINE YELLOW 06/15/2023 0309   APPEARANCEUR CLEAR 06/15/2023 0309   LABSPEC 1.010 06/15/2023 0309   PHURINE 5.5 06/15/2023 0309   GLUCOSEU NEGATIVE 06/15/2023 0309   HGBUR NEGATIVE 06/15/2023 0309   BILIRUBINUR NEGATIVE 06/15/2023 0309   KETONESUR NEGATIVE 06/15/2023 0309   PROTEINUR NEGATIVE 06/15/2023 0309   UROBILINOGEN 0.2 07/14/2009 1843   NITRITE NEGATIVE 06/15/2023 0309   LEUKOCYTESUR NEGATIVE 06/15/2023 0309    Sepsis Labs: Lactic Acid, Venous    Component Value Date/Time   LATICACIDVEN 0.9 06/17/2023 0431    MICROBIOLOGY: Recent Results (from the past 240 hours)  Blood Culture (routine x 2)     Status: None   Collection Time: 06/14/23  8:06 PM   Specimen: BLOOD  Result Value Ref Range Status   Specimen Description BLOOD RIGHT ARM  Final   Special Requests   Final    BOTTLES DRAWN AEROBIC AND ANAEROBIC Blood Culture adequate volume   Culture   Final    NO GROWTH 5 DAYS Performed at [facility], [address], [place], [place] [address]    Report Status 06/19/2023 FINAL  Final  Resp panel by RT-PCR (RSV, Flu A&B, Covid) Anterior Nasal Swab     Status: None   Collection Time: 06/14/23  8:27 PM   Specimen: Anterior Nasal Swab  Result Value Ref Range Status   SARS Coronavirus 2 by RT PCR NEGATIVE NEGATIVE Final   Influenza A by PCR NEGATIVE NEGATIVE Final   Influenza B by PCR NEGATIVE NEGATIVE Final    Comment: (NOTE) The Xpert Xpress SARS-CoV-2/FLU/RSV plus assay is intended as an aid in the diagnosis of influenza from Nasopharyngeal swab specimens and should not be used as a sole basis for treatment. Nasal washings and aspirates are unacceptable for Xpert Xpress SARS-CoV-2/FLU/RSV testing.  Fact Sheet for Patients: BloggerCourse.com  Fact Sheet for Healthcare Providers: SeriousBroker.it  This test is not yet approved or  cleared by the [place] FDA and has been authorized for detection and/or diagnosis of SARS-CoV-2 by FDA under an Emergency  Use Authorization (EUA). This EUA will remain in effect (meaning this test can be used) for the duration of the COVID-19 declaration under Section 564(b)(1) of the Act, 21 U.S.C. section 360bbb-3(b)(1), unless the authorization is terminated or revoked.     Resp Syncytial Virus by PCR NEGATIVE NEGATIVE Final    Comment: (NOTE) Fact Sheet for Patients: BloggerCourse.com  Fact Sheet for Healthcare Providers: SeriousBroker.it  This test is not yet approved or cleared by the [place] FDA and has been authorized for detection and/or diagnosis of SARS-CoV-2 by FDA under an Emergency Use Authorization (EUA). This EUA will remain in effect (meaning this test can be used) for the duration of the COVID-19 declaration under Section 564(b)(1) of the Act, 21 U.S.C. section 360bbb-3(b)(1), unless the authorization is terminated or revoked.  Performed at [facility], [address], [place], [place] [address]   Blood Culture (routine x 2)     Status: None   Collection Time: 06/14/23  8:30 PM   Specimen: BLOOD  Result Value Ref Range Status   Specimen Description BLOOD LEFT ARM  Final   Special Requests   Final    BOTTLES DRAWN AEROBIC AND ANAEROBIC Blood Culture adequate volume   Culture   Final    NO GROWTH 5 DAYS Performed at [facility], [address], [place], [place] [address]    Report Status 06/19/2023 FINAL  Final  Expectorated Sputum Assessment w Gram Stain, Rflx to Resp Cult     Status: None   Collection Time: 06/16/23  4:48 AM   Specimen: Expectorated Sputum  Result Value Ref Range Status   Specimen Description EXPECTORATED SPUTUM  Final   Special Requests NONE  Final   Sputum evaluation   Final    THIS SPECIMEN IS ACCEPTABLE FOR SPUTUM CULTURE Performed at [facility]  Lab, [address], [place], [place] [address]    Report Status 06/16/2023 FINAL  Final  Culture, Respiratory w Gram Stain     Status: None   Collection Time: 06/16/23  4:48 AM  Result Value Ref Range Status   Specimen Description EXPECTORATED SPUTUM  Final   Special Requests NONE Reflexed from [id number]  Final   Gram Stain   Final    FEW WBC PRESENT, PREDOMINANTLY PMN FEW GRAM NEGATIVE RODS FEW GRAM POSITIVE COCCI    Culture   Final    MODERATE Normal respiratory flora-no Staph aureus or Pseudomonas seen Performed at [facility], [address], [place], [place] [address]    Report Status 06/19/2023 FINAL  Final    RADIOLOGY STUDIES/RESULTS: No results found.    LOS: 9 days   Jeoffrey Massed, MD  [facility]    To contact the attending provider between 7A-7P or the covering provider during after hours 7P-7A, please log into the web site [email or web address] and access using universal [place] password for that web site. If you do not have the password, please call the hospital operator.  06/24/2023, 11:25 AM

## 2023-06-24 NOTE — Progress Notes (Signed)
 Daily Progress Note   Patient Name: Steven Guerrero       Date: 06/24/2023 DOB: 1940/11/28  Age: 83 y.o. MRN#: [id number] Attending Physician: Maretta Bees, MD Primary Care Physician: Merri Brunette, MD Admit Date: 06/14/2023  Reason for Consultation/Follow-up: Establishing goals of care  Subjective: Medical records reviewed including progress notes, labs and imaging. Patient assessed at the bedside.  He is sitting up in bedside chair with his daughter-in-law visiting.  Reports feeling well today.  Created space and opportunity for patient's thoughts and feelings on his current illness.  Discussed MOST form that was previously introduced.  At this time, patient prefers to hold off on completion and continue discussions with his family at a later date.  Outpatient palliative care was explained and offered. Encouraged patient to consider, particularly if going to a SNF environment.  He defers to his wife on this and daughter-in-law shares that she will present the option.  I shared that they are welcome to request referral from [facility] if the decision is made to proceed with outpatient palliative care.  Encouraged patient and family to reach out to PMT with any other needs while he remains admitted.  Patient and family verbalized understanding and appreciation.  Questions and concerns addressed. PMT will continue to support holistically.   Length of Stay: 9   Physical Exam Vitals and nursing note reviewed.  Constitutional:      General: He is not in acute distress.    Appearance: He is ill-appearing.     Interventions: Nasal cannula in place.     Comments: 3L   Cardiovascular:     Rate and Rhythm: Normal rate.  Pulmonary:     Effort: Pulmonary effort is normal.  Neurological:     Mental Status: He is alert. Mental status is at baseline.             Vital Signs: BP 126/69   Pulse 79   Temp 98.4 F (36.9 C) (Oral)   Resp (!) 22   Ht 5\' 11"  (1.803 m)   Wt 77.6 kg   SpO2 93%   BMI 23.86 kg/m  SpO2: SpO2: 93 % O2 Device: O2 Device: High Flow Nasal Cannula O2 Flow Rate: O2 Flow Rate (L/min): 4 L/min      Palliative Assessment/Data: 40-50%   Palliative Care Assessment & Plan   Patient Profile: 83 y.o. male  with past medical history of VTE/PE on Xarelto, COPD, CVA, chronic dysphagia, OSA not on CPAP, CKD 3B, GERD, COPD/emphysema, chronic bronchitis, subcentimeter lung nodules admitted on 06/14/2023 with sudden onset shortness of breath, productive cough, and wheezing.  Patient was admitted with severe sepsis with acute hypoxic respiratory failure secondary to aspiration pneumonia. He remains dyspneic with minimal exertion and requiring HHFNC. PMT has been consulted to assist with goals of care conversation.  Assessment: Goals of care conversation Sepsis with acute hypoxic respiratory failure secondary to aspiration pneumonia, improving  Recommendations/Plan: Continue DNR/DNI Continue current care plan Patient now agreeable to SNF.  He will defer to his wife on whether outpatient palliative care referral is desired Psychosocial and emotional support provided PMT remains available as needed   Prognosis: High risk for further decline  Discharge Planning: SNF  Care plan was discussed with patient, patient's daughter-in-law          Floris Neuhaus Jeni Salles, PA-C  Palliative Medicine Team Team phone # [phone]  Thank you for allowing the Palliative Medicine Team to assist in the care of this patient. Please utilize secure chat with additional questions, if there is no response within 30 minutes please call the above phone number.  Palliative Medicine Team providers are available by phone from 7am to 7pm daily and can be reached through the team cell phone.  Should this patient require assistance outside of  these hours, please call the patient's attending physician.

## 2023-06-24 NOTE — Progress Notes (Signed)
 Physical Therapy Treatment Patient Details Name: Steven Guerrero MRN: [id number] DOB: 10/10/1940 Today's Date: 06/24/2023   History of Present Illness Pt is 83 yo male who presents on 06/15/23 with SOB, wheezing, and productive cough. CT concerning for PNA. Code sepsis called in ED. PMH: CVA, TBI 2003, PE, OSA not on CPAP, CKD3, GERD, COPD/ emphysema, chronic bronchitis, lung nodules, R THA    PT Comments  Pt seen for PT tx with pt agreeable, family & nurse present during session. Daughter reports pt's wife is at home sick & pt & wife are unsure they are physically able to care for pt at his current level of function, pt agreeable to d/c to SNF rehab & team made aware. Pt requires mod assist for supine>sit with HOB elevated, elects to hold to PT's hand vs bed rails to pull himself to sitting. Pt performs multiple STS transfers with RW & min assist, continent void on BSC. Pt received with nasal cannula in nose but not hooked up, SpO2 88-90% at rest on room air, placed on & left on 3L/min for mobility & at end of session. Poor pleth waveform but readings as low as 69% but pt denied SOB, SpO2 >/= 89% on 3L/min at rest. Nurse made aware of O2 situation. Pt would benefit from ongoing PT treatment to address endurance, balance, & gait with LRAD to reduce fall risk & increase independence with mobility.    If plan is discharge home, recommend the following: A little help with walking and/or transfers;A little help with bathing/dressing/bathroom;Assistance with cooking/housework;Assist for transportation;Help with stairs or ramp for entrance   Can travel by private vehicle     No  Equipment Recommendations  Other (comment) (defer to next venue)    Recommendations for Other Services       Precautions / Restrictions Precautions Precautions: Fall Recall of Precautions/Restrictions: Impaired Precaution/Restrictions Comments: monitor SpO2 Restrictions Weight Bearing Restrictions Per Provider Order: No      Mobility  Bed Mobility Overal bed mobility: Needs Assistance Bed Mobility: Supine to Sit     Supine to sit: Mod assist, HOB elevated, Used rails     General bed mobility comments: Pt holds to PTs hands to pull himself to sitting EOB, requires extra time.    Transfers Overall transfer level: Needs assistance Equipment used: Rolling walker (2 wheels) Transfers: Sit to/from Stand Sit to Stand: Min assist   Step pivot transfers: Min assist       General transfer comment: STS from EOB, recliner, BSC with cuing re: hand placement especially to reach back to stable surface prior to sitting.    Ambulation/Gait                   Stairs             Wheelchair Mobility     Tilt Bed    Modified Rankin (Stroke Patients Only)       Balance Overall balance assessment: Needs assistance Sitting-balance support: Feet supported Sitting balance-Leahy Scale: Fair Sitting balance - Comments: supervision static sitting   Standing balance support: Bilateral upper extremity supported, During functional activity, Reliant on assistive device for balance Standing balance-Leahy Scale: Fair                              Musician Communication: Impaired Factors Affecting Communication: Reduced clarity of speech  Cognition Arousal: Alert Behavior During Therapy: WFL for tasks assessed/performed   PT -  Cognitive impairments: Difficult to assess Difficult to assess due to: Impaired communication                       Following commands: Impaired Following commands impaired: Follows one step commands with increased time    Cueing Cueing Techniques: Verbal cues, Gestural cues, Tactile cues  Exercises      General Comments General comments (skin integrity, edema, etc.): Pt with small continent BM, requires total assist for peri hygiene.      Pertinent Vitals/Pain Pain Assessment Pain Assessment: No/denies pain    Home  Living                          Prior Function            PT Goals (current goals can now be found in the care plan section) Acute Rehab PT Goals Patient Stated Goal: return home PT Goal Formulation: With patient/family Time For Goal Achievement: 06/29/23 Potential to Achieve Goals: Good Progress towards PT goals: Progressing toward goals    Frequency    Min 2X/week      PT Plan      Co-evaluation              AM-PAC PT "6 Clicks" Mobility   Outcome Measure  Help needed turning from your back to your side while in a flat bed without using bedrails?: A Little Help needed moving from lying on your back to sitting on the side of a flat bed without using bedrails?: A Lot Help needed moving to and from a bed to a chair (including a wheelchair)?: A Little Help needed standing up from a chair using your arms (e.g., wheelchair or bedside chair)?: A Little Help needed to walk in hospital room?: A Little Help needed climbing 3-5 steps with a railing? : A Lot 6 Click Score: 16    End of Session Equipment Utilized During Treatment: Oxygen Activity Tolerance: Patient tolerated treatment well Patient left: in chair;with nursing/sitter in room;with family/visitor present Nurse Communication: Mobility status PT Visit Diagnosis: Unsteadiness on feet (R26.81);Muscle weakness (generalized) (M62.81);History of falling (Z91.81);Difficulty in walking, not elsewhere classified (R26.2)     Time: 1610-9604 PT Time Calculation (min) (ACUTE ONLY): 28 min  Charges:    $Therapeutic Activity: 23-37 mins PT General Charges $$ ACUTE PT VISIT: 1 Visit                     Aleda Grana, PT, DPT 06/24/23, 10:26 AM   Sandi Mariscal 06/24/2023, 10:24 AM

## 2023-06-24 NOTE — Progress Notes (Signed)
 Occupational Therapy Treatment Patient Details Name: Steven Guerrero MRN: [id number] DOB: 1940/10/25 Today's Date: 06/24/2023   History of present illness Pt is 83 yo male who presents on 06/15/23 with SOB, wheezing, and productive cough. CT concerning for PNA. Code sepsis called in ED. PMH: CVA, TBI 2003, PE, OSA not on CPAP, CKD3, GERD, COPD/ emphysema, chronic bronchitis, lung nodules, R THA   OT comments  Pt progressing with gait today, able to ambulate to bathroom using energy conservation strategies and increased time. Pt was CGA using RW to slowly ambulate to bathroom, continues to need Max A to setup for ADLs. Pt Sp02 stable on 3L using energy conservation strategies, he c/o "some" SOB with activity and Sp02 would show a desat but to 79 but pleth would be poor and pt would rebound quickly once pleth read better. OT to continue to progress pt as able, DC plans remain appropriate for skilled rehab <3hrs of inpatient therapy.       If plan is discharge home, recommend the following:  A little help with bathing/dressing/bathroom;A lot of help with walking and/or transfers;Assistance with cooking/housework;Direct supervision/assist for medications management;Direct supervision/assist for financial management;Help with stairs or ramp for entrance;Supervision due to cognitive status   Equipment Recommendations  None recommended by OT (All DME needs met)    Recommendations for Other Services      Precautions / Restrictions Precautions Precautions: Fall Recall of Precautions/Restrictions: Impaired Precaution/Restrictions Comments: monitor SpO2 Restrictions Weight Bearing Restrictions Per Provider Order: No       Mobility Bed Mobility Overal bed mobility: Needs Assistance Bed Mobility: Supine to Sit, Sit to Supine     Supine to sit: Mod assist, Used rails Sit to supine: Min assist   General bed mobility comments: min A to assist with BLE return to supine. Mod A to raise trunk into  upright position    Transfers Overall transfer level: Needs assistance Equipment used: Rolling walker (2 wheels) Transfers: Sit to/from Stand Sit to Stand: Min assist           General transfer comment: cues for hand placement - pt ambulatory to bathroom with CGA and increased time     Balance Overall balance assessment: Needs assistance Sitting-balance support: Feet supported Sitting balance-Leahy Scale: Fair     Standing balance support: Bilateral upper extremity supported, During functional activity, Reliant on assistive device for balance Standing balance-Leahy Scale: Fair                             ADL either performed or assessed with clinical judgement   ADL                       Lower Body Dressing: Sitting/lateral leans;Maximal assistance Lower Body Dressing Details (indicate cue type and reason): to dof/don socks Toilet Transfer: Minimal assistance;Rolling walker (2 wheels);Grab bars;Ambulation   Toileting- Clothing Manipulation and Hygiene: Maximal assistance;Cueing for sequencing       Functional mobility during ADLs: Contact guard assist;Rolling walker (2 wheels) General ADL Comments: Reinforced pursed lip breathing with OOB mobiltiy    Extremity/Trunk Assessment              Vision       Perception     Praxis     Communication Communication Communication: Impaired Factors Affecting Communication: Reduced clarity of speech   Cognition Arousal: Alert Behavior During Therapy: WFL for tasks assessed/performed Cognition: No apparent impairments, Difficult to  assess Difficult to assess due to: Impaired communication (muffled speech)                             Following commands: Impaired Following commands impaired: Follows one step commands with increased time      Cueing   Cueing Techniques: Verbal cues, Gestural cues, Tactile cues  Exercises      Shoulder Instructions       General Comments  Sp02 >90% on 3L with pursed lip breathing when OOB and with accurate pleth. HR up to 115 bpm with activity    Pertinent Vitals/ Pain       Pain Assessment Pain Assessment: No/denies pain  Home Living                                          Prior Functioning/Environment              Frequency  Min 2X/week        Progress Toward Goals  OT Goals(current goals can now be found in the care plan section)  Progress towards OT goals: Progressing toward goals  Acute Rehab OT Goals Patient Stated Goal: To go home OT Goal Formulation: With patient Time For Goal Achievement: 06/29/23 Potential to Achieve Goals: Good  Plan      Co-evaluation                 AM-PAC OT "6 Clicks" Daily Activity     Outcome Measure   Help from another person eating meals?: None Help from another person taking care of personal grooming?: A Little Help from another person toileting, which includes using toliet, bedpan, or urinal?: A Lot Help from another person bathing (including washing, rinsing, drying)?: A Lot Help from another person to put on and taking off regular upper body clothing?: A Little Help from another person to put on and taking off regular lower body clothing?: A Lot 6 Click Score: 16    End of Session Equipment Utilized During Treatment: Gait belt;Rolling walker (2 wheels);Oxygen (3l 02)  OT Visit Diagnosis: Unsteadiness on feet (R26.81);Other abnormalities of gait and mobility (R26.89);Repeated falls (R29.6);Muscle weakness (generalized) (M62.81);History of falling (Z91.81)   Activity Tolerance Patient tolerated treatment well   Patient Left in bed;with call bell/phone within reach;with bed alarm set   Nurse Communication Mobility status        Time: 1610-9604 OT Time Calculation (min): 27 min  Charges: OT General Charges $OT Visit: 1 Visit OT Treatments $Therapeutic Activity: 23-37 mins  06/24/2023  AB, OTR/L  Acute Rehabilitation  Services  Office: [phone]   Tristan Schroeder 06/24/2023, 6:34 PM

## 2023-06-25 DIAGNOSIS — J189 Pneumonia, unspecified organism: Secondary | ICD-10-CM | POA: Diagnosis not present

## 2023-06-25 DIAGNOSIS — Z7189 Other specified counseling: Secondary | ICD-10-CM | POA: Diagnosis not present

## 2023-06-25 DIAGNOSIS — Z515 Encounter for palliative care: Secondary | ICD-10-CM | POA: Diagnosis not present

## 2023-06-25 LAB — BASIC METABOLIC PANEL WITH GFR
Anion gap: 11 (ref 5–15)
BUN: 11 mg/dL (ref 8–23)
CO2: 26 mmol/L (ref 22–32)
Calcium: 8.5 mg/dL — ABNORMAL LOW (ref 8.9–10.3)
Chloride: 100 mmol/L (ref 98–111)
Creatinine, Ser: 1.46 mg/dL — ABNORMAL HIGH (ref 0.61–1.24)
GFR, Estimated: 47 mL/min — ABNORMAL LOW (ref >60)
Glucose, Bld: 84 mg/dL (ref 70–99)
Potassium: 3.5 mmol/L (ref 3.5–5.1)
Sodium: 137 mmol/L (ref 135–145)

## 2023-06-25 LAB — MAGNESIUM: Magnesium: 2 mg/dL (ref 1.7–2.4)

## 2023-06-25 NOTE — TOC Progression Note (Addendum)
 Transition of Care [facility]) - Progression Note    Patient Details  Name: Steven Guerrero MRN: [id number] Date of Birth: 28-Feb-1941  Transition of Care [facility]) CM/SW Contact  Alesia Richards, RN 06/25/2023, 2:06 PM  Clinical Narrative:     Per Matthew Folks, call from [name], Admissions, [facility] SNF can provide short term rehabilitation. CM follow up call placed to patient's wife, Steven Guerrero, phone: [phone] regarding SNF preferences. Per patient's wife, either [facility] SNF or [facility].  Patient has [facility] Medicare Advantage. Patient will require authorization approval. CM will initiate auth.   RN CM initiated SNF auth for [facility] SN[facility]. Auth is pended status. References [id number]. CM will follow up regarding discharge care planning needs.  Expected Discharge Plan: Skilled Nursing Facility Barriers to Discharge: Continued Medical Work up, SNF Pending bed offer, English as a second language teacher  Expected Discharge Plan and Services In-house Referral: Clinical Social Work Discharge Planning Services: CM Consult Post Acute Care Choice: [name], commercial, Home Health Living arrangements for the past 2 months: Single Family Home                      HH Agency: [name] Date HH Agency Contacted: 06/22/23 Time HH Agency Contacted: 1429 Representative spoke with at [facility] Agency: [name]   Social Determinants of Health (SDOH) Interventions SDOH Screenings   Food Insecurity: No Food Insecurity (06/15/2023)  Housing: High Risk (06/15/2023)  Transportation Needs: No Transportation Needs (06/15/2023)  Utilities: Not At Risk (06/15/2023)  Social Connections: Moderately Isolated (06/15/2023)  Tobacco Use: Medium Risk (06/15/2023)    Readmission Risk Interventions     No data to display

## 2023-06-25 NOTE — Plan of Care (Signed)

## 2023-06-25 NOTE — Progress Notes (Signed)
 PROGRESS NOTE        PATIENT DETAILS Name: Steven Guerrero Age: 83 y.o. Sex: male Date of Birth: Aug 17, 1940 Admit Date: 06/14/2023 Admitting Physician Darlin Drop, DO JYN:WGNFA, Zollie Beckers, MD  Brief Summary: Patient is a 83 y.o.  male with history of VTE on Xarelto, COPD, CVA, chronic dysphagia, OSA not on CPAP-who presented with shortness of breath/cough/wheezing and 1 episode of vomiting-patient was found to have acute hypoxic respiratory failure secondary pneumonia and subsequently admitted to the hospitalist service.  Significant events: 3/23>> admitted TRH-SOB-presumed pneumonia.  Significant studies: 3/23>> CT chest/abdomen/pelvis: Developing infiltrates in lung bases. 3/28>> CT chest: Complete middle lobe collapse-bibasilar consolidation.  Significant microbiology data: 3/23>> COVID/influenza/RSV PCR: Negative 3/23>> blood culture: No growth 3/25>> sputum culture: Normal respiratory flora  Procedures: None  Consults: Palliative care  Subjective: Apparently may have aspirated some water last night-had a coughing spell this morning-back on 5 L but slowly titrated back down to 3-4 L.  Appears unchanged.  Objective: Vitals: Blood pressure 118/60, pulse 76, temperature 98 F (36.7 C), temperature source Oral, resp. rate 20, height 5\' 11"  (1.803 m), weight 77.6 kg, SpO2 92%.   Exam: Gen Exam:Alert awake-not in any distress HEENT:atraumatic, normocephalic Chest: B/L clear to auscultation anteriorly CVS:S1S2 regular Abdomen:soft non tender, non distended Extremities:no edema Neurology: Non focal-slight right facial droop which is chronic. Skin: no rash  Pertinent Labs/Radiology:    Latest Ref Rng & Units 06/24/2023    4:50 AM 06/22/2023    4:34 AM 06/21/2023    5:52 AM  CBC  WBC 4.0 - 10.5 K/uL 6.3  6.5  7.4   Hemoglobin 13.0 - 17.0 g/dL 21.3  08.6  57.8   Hematocrit 39.0 - 52.0 % 31.6  34.7  30.3   Platelets 150 - 400 K/uL 223  203  181      Lab Results  Component Value Date   NA 137 06/25/2023   K 3.5 06/25/2023   CL 100 06/25/2023   CO2 26 06/25/2023      Assessment/Plan: Severe sepsis with acute hypoxic respiratory failure secondary to aspiration pneumonia Continues to slowly improve-slightly more oxygen requirement today-suspect may have aspirated some water overnight Stable on around 4 L of oxygen this morning Zyvox-Zosyn x 7 days total Volume status stable does not require diuretics Likely will require oxygen on discharge.   Continue flutter/incentive spirometry.   DNR-palliative care following-thankfully has improved but if worsens-apart from hospice really no other options.   Dysphagia Chronic issue-evaluated by SLP-on dysphagia 3 diet.  History of VTE On Xarelto  CKD stage IIIb At baseline Follow electrolytes periodically  Hypokalemia Repleted.  COPD Scheduled bronchodilators  OSA CPAP nightly  HLD Statin  HTN BP stable Continue amlodipine Resume losartan when able.  BPH Flomax  4.4 cm thoracic ascending aortic aneurysm Incidental finding on CT imaging. Radiology recommending annual CTA/MRA.  BMI: Estimated body mass index is 23.86 kg/m as calculated from the following:   Height as of this encounter: 5\' 11"  (1.803 m).   Weight as of this encounter: 77.6 kg.   Code status:   Code Status: Limited: Do not attempt resuscitation (DNR) -DNR-LIMITED -Do Not Intubate/DNI    DVT Prophylaxis: rivaroxaban (XARELTO) tablet 10 mg Start: 06/15/23 1000 rivaroxaban (XARELTO) tablet 10 mg    Family Communication: Daughter at bedside on 4/3   Disposition Plan: Status is:  Inpatient Remains inpatient appropriate because: Severity of illness   Planned Discharge Destination:Home   Diet: Diet Order             DIET DYS 3 Room service appropriate? Yes with Assist; Fluid consistency: Thin  Diet effective now                     Antimicrobial agents: Anti-infectives (From  admission, onward)    Start     Dose/Rate Route Frequency Ordered Stop   06/23/23 2200  linezolid (ZYVOX) tablet 600 mg        600 mg Oral Every 12 hours 06/23/23 1157 06/26/23 0959   06/19/23 1000  linezolid (ZYVOX) IVPB 600 mg  Status:  Discontinued        600 mg 300 mL/hr over 60 Minutes Intravenous Every 12 hours 06/19/23 0854 06/23/23 1157   06/19/23 0945  piperacillin-tazobactam (ZOSYN) IVPB 3.375 g        3.375 g 12.5 mL/hr over 240 Minutes Intravenous Every 8 hours 06/19/23 0854 06/25/23 2359   06/17/23 1000  Ampicillin-Sulbactam (UNASYN) 3 g in sodium chloride 0.9 % 100 mL IVPB  Status:  Discontinued        3 g 200 mL/hr over 30 Minutes Intravenous Every 6 hours 06/17/23 0911 06/19/23 1025   06/15/23 2230  azithromycin (ZITHROMAX) 500 mg in sodium chloride 0.9 % 250 mL IVPB  Status:  Discontinued        500 mg 250 mL/hr  Intravenous Every 24 hours 06/15/23 0410 06/17/23 0911   06/15/23 2200  cefTRIAXone (ROCEPHIN) 2 g in sodium chloride 0.9 % 100 mL IVPB  Status:  Discontinued        2 g 200 mL/hr over 30 Minutes Intravenous Every 24 hours 06/15/23 0410 06/17/23 0911   06/14/23 2330  cefTRIAXone (ROCEPHIN) 2 g in sodium chloride 0.9 % 100 mL IVPB        2 g 200 mL/hr over 30 Minutes Intravenous  Once 06/14/23 2326 06/15/23 0025   06/14/23 2330  azithromycin (ZITHROMAX) 500 mg in sodium chloride 0.9 % 250 mL IVPB        500 mg 250 mL/hr over 60 Minutes Intravenous  Once 06/14/23 2326 06/15/23 0056        MEDICATIONS: Scheduled Meds:  amLODipine  5 mg Oral Daily   arformoterol  15 mcg Nebulization BID   atorvastatin  20 mg Oral Daily   budesonide (PULMICORT) nebulizer solution  0.25 mg Nebulization BID   citalopram  20 mg Oral QHS   cyanocobalamin  1,000 mcg Oral Daily   guaiFENesin  15 mL Oral Q6H   linezolid  600 mg Oral Q12H   multivitamin with minerals  1 tablet Oral Daily   pantoprazole (PROTONIX) IV  40 mg Intravenous Q24H   revefenacin  175 mcg Nebulization  Daily   rivaroxaban  10 mg Oral Daily   tamsulosin  0.4 mg Oral Daily   Continuous Infusions:  piperacillin-tazobactam (ZOSYN)  IV 3.375 g (06/25/23 0950)   PRN Meds:.acetaminophen, hydrALAZINE, melatonin, menthol-cetylpyridinium, mouth rinse, phenol, polyethylene glycol, prochlorperazine, simethicone   I have personally reviewed following labs and imaging studies  LABORATORY DATA: CBC: Recent Labs  Lab 06/19/23 0600 06/20/23 0500 06/21/23 0552 06/22/23 0434 06/24/23 0450  WBC 12.2* 10.8* 7.4 6.5 6.3  HGB 12.0* 11.1* 10.3* 12.0* 10.8*  HCT 35.1* 32.4* 30.3* 34.7* 31.6*  MCV 94.9 94.5 94.4 94.0 92.9  PLT 178 172 181 203 223    Basic  Metabolic Panel: Recent Labs  Lab 06/20/23 0500 06/21/23 0552 06/22/23 0434 06/24/23 0450 06/25/23 0506  NA 137 135 137 134* 137  K 3.7 3.2* 3.5 2.8* 3.5  CL 100 99 99 98 100  CO2 29 26 28 25 26   GLUCOSE 131* 84 105* 92 84  BUN 17 14 12 13 11   CREATININE 1.62* 1.52* 1.45* 1.56* 1.46*  CALCIUM 8.3* 8.1* 8.8* 8.4* 8.5*  MG  --   --  1.9  --  2.0    GFR: Estimated Creatinine Clearance: 40.8 mL/min (A) (by C-G formula based on SCr of 1.46 mg/dL (H)).  Liver Function Tests: Recent Labs  Lab 06/21/23 0552  AST 68*  ALT 68*  ALKPHOS 54  BILITOT 1.1  PROT 5.8*  ALBUMIN 2.3*   No results for input(s): "LIPASE", "AMYLASE" in the last 168 hours. No results for input(s): "AMMONIA" in the last 168 hours.  Coagulation Profile: No results for input(s): "INR", "PROTIME" in the last 168 hours.   Cardiac Enzymes: No results for input(s): "CKTOTAL", "CKMB", "CKMBINDEX", "TROPONINI" in the last 168 hours.  BNP (last 3 results) No results for input(s): "PROBNP" in the last 8760 hours.  Lipid Profile: No results for input(s): "CHOL", "HDL", "LDLCALC", "TRIG", "CHOLHDL", "LDLDIRECT" in the last 72 hours.  Thyroid Function Tests: No results for input(s): "TSH", "T4TOTAL", "FREET4", "T3FREE", "THYROIDAB" in the last 72 hours.  Anemia  Panel: No results for input(s): "VITAMINB12", "FOLATE", "FERRITIN", "TIBC", "IRON", "RETICCTPCT" in the last 72 hours.  Urine analysis:    Component Value Date/Time   COLORURINE YELLOW 06/15/2023 0309   APPEARANCEUR CLEAR 06/15/2023 0309   LABSPEC 1.010 06/15/2023 0309   PHURINE 5.5 06/15/2023 0309   GLUCOSEU NEGATIVE 06/15/2023 0309   HGBUR NEGATIVE 06/15/2023 0309   BILIRUBINUR NEGATIVE 06/15/2023 0309   KETONESUR NEGATIVE 06/15/2023 0309   PROTEINUR NEGATIVE 06/15/2023 0309   UROBILINOGEN 0.2 07/14/2009 1843   NITRITE NEGATIVE 06/15/2023 0309   LEUKOCYTESUR NEGATIVE 06/15/2023 0309    Sepsis Labs: Lactic Acid, Venous    Component Value Date/Time   LATICACIDVEN 0.9 06/17/2023 0431    MICROBIOLOGY: Recent Results (from the past 240 hours)  Expectorated Sputum Assessment w Gram Stain, Rflx to Resp Cult     Status: None   Collection Time: 06/16/23  4:48 AM   Specimen: Expectorated Sputum  Result Value Ref Range Status   Specimen Description EXPECTORATED SPUTUM  Final   Special Requests NONE  Final   Sputum evaluation   Final    THIS SPECIMEN IS ACCEPTABLE FOR SPUTUM CULTURE Performed at [facility], [address], [place], [place] [address]    Report Status 06/16/2023 FINAL  Final  Culture, Respiratory w Gram Stain     Status: None   Collection Time: 06/16/23  4:48 AM  Result Value Ref Range Status   Specimen Description EXPECTORATED SPUTUM  Final   Special Requests NONE Reflexed from [id number]  Final   Gram Stain   Final    FEW WBC PRESENT, PREDOMINANTLY PMN FEW GRAM NEGATIVE RODS FEW GRAM POSITIVE COCCI    Culture   Final    MODERATE Normal respiratory flora-no Staph aureus or Pseudomonas seen Performed at [facility], [address] [place], [place] [address]    Report Status 06/19/2023 FINAL  Final    RADIOLOGY STUDIES/RESULTS: No results found.    LOS: 10 days   Jeoffrey Massed, MD  [facility]    To contact the attending  provider between 7A-7P  or the covering provider during after hours 7P-7A, please log into the web site [email or web address] and access using universal [place] password for that web site. If you do not have the password, please call the hospital operator.  06/25/2023, 1:19 PM

## 2023-06-25 NOTE — Progress Notes (Signed)
 Speech Language Pathology Treatment: Dysphagia  Patient Details Name: Steven Guerrero MRN: [id number] DOB: [birth year] Today's Date: 06/25/2023 Time: 4098-1191 SLP Time Calculation (min) (ACUTE ONLY): 9 min  Assessment / Plan / Recommendation Clinical Impression  Steven Guerrero has made excellent improvement in his swallowing overall.  He will still need to crush meds and take single sips of liquid to accommodate presence of Zenker's diverticulum. Reviewed these precautions with pt and his daughter at the bedside.  Recommend he continue a mechanical soft diet, thin liquids. No further acute care SLP f/u is needed. He is stable on current diet. Our service will sign off.   HPI HPI: Steven Guerrero is a 83 y.o. male with medical history significant for previous CVA in 2001, TBI in 2003, history of PE on Xarelto, OSA not on CPAP, CKD 3B, GERD, COPD/emphysema, chronic bronchitis, subcentimeter lung nodules followed by pulmonary, presents to the ER from home via EMS with complaints of sudden onset shortness of breath, productive cough, and wheezing today.  Associated with 1 episode of vomiting.  EMS was activated.  Upon EMS assessment, the patient was found to be febrile with a Tmax 103.  Patient took Tylenol prior to arrival to the ER.  En route, he received albuterol nebs, Atrovent nebs, IV Solu-Medrol 125 mg x 1.  Per his wife he has had episodes of chocking on his food.  Eats a soft diet with small bites.  In the ER, respiratory status appeared to be improving.  CT chest abdomen pelvis revealed bibasilar infiltrates with concern for developing pneumonia.      SLP Plan  All goals met      Recommendations for follow up therapy are one component of a multi-disciplinary discharge planning process, led by the attending physician.  Recommendations may be updated based on patient status, additional functional criteria and insurance authorization.    Recommendations  Diet recommendations: Dysphagia 3 (mechanical  soft);Thin liquid Liquids provided via: Cup;Straw Medication Administration: Crushed with puree Supervision: Patient able to self feed;Staff to assist with self feeding Compensations: Small sips/bites;Slow rate Postural Changes and/or Swallow Maneuvers: Seated upright 90 degrees;Upright 30-60 min after meal                  Oral care BID     Dysphagia, oropharyngeal phase (R13.12)     All goals met    Steven Deman L. Samson Frederic, MA CCC/SLP Clinical Specialist - Acute Care SLP Acute Rehabilitation Services Office number [phone]   Steven Guerrero  06/25/2023, 10:54 AM

## 2023-06-26 DIAGNOSIS — I7121 Aneurysm of the ascending aorta, without rupture: Secondary | ICD-10-CM | POA: Diagnosis not present

## 2023-06-26 DIAGNOSIS — G9009 Other idiopathic peripheral autonomic neuropathy: Secondary | ICD-10-CM | POA: Diagnosis not present

## 2023-06-26 DIAGNOSIS — L258 Unspecified contact dermatitis due to other agents: Secondary | ICD-10-CM | POA: Diagnosis not present

## 2023-06-26 DIAGNOSIS — R2681 Unsteadiness on feet: Secondary | ICD-10-CM | POA: Diagnosis not present

## 2023-06-26 DIAGNOSIS — R1312 Dysphagia, oropharyngeal phase: Secondary | ICD-10-CM | POA: Diagnosis not present

## 2023-06-26 DIAGNOSIS — J9600 Acute respiratory failure, unspecified whether with hypoxia or hypercapnia: Secondary | ICD-10-CM | POA: Diagnosis not present

## 2023-06-26 DIAGNOSIS — M25512 Pain in left shoulder: Secondary | ICD-10-CM | POA: Diagnosis not present

## 2023-06-26 DIAGNOSIS — Z7901 Long term (current) use of anticoagulants: Secondary | ICD-10-CM | POA: Diagnosis not present

## 2023-06-26 DIAGNOSIS — G4733 Obstructive sleep apnea (adult) (pediatric): Secondary | ICD-10-CM | POA: Diagnosis not present

## 2023-06-26 DIAGNOSIS — J42 Unspecified chronic bronchitis: Secondary | ICD-10-CM | POA: Diagnosis not present

## 2023-06-26 DIAGNOSIS — F419 Anxiety disorder, unspecified: Secondary | ICD-10-CM | POA: Diagnosis not present

## 2023-06-26 DIAGNOSIS — R499 Unspecified voice and resonance disorder: Secondary | ICD-10-CM | POA: Diagnosis not present

## 2023-06-26 DIAGNOSIS — R2242 Localized swelling, mass and lump, left lower limb: Secondary | ICD-10-CM | POA: Diagnosis not present

## 2023-06-26 DIAGNOSIS — M6281 Muscle weakness (generalized): Secondary | ICD-10-CM | POA: Diagnosis not present

## 2023-06-26 DIAGNOSIS — J189 Pneumonia, unspecified organism: Secondary | ICD-10-CM | POA: Diagnosis not present

## 2023-06-26 DIAGNOSIS — R21 Rash and other nonspecific skin eruption: Secondary | ICD-10-CM | POA: Diagnosis not present

## 2023-06-26 DIAGNOSIS — A419 Sepsis, unspecified organism: Secondary | ICD-10-CM | POA: Diagnosis not present

## 2023-06-26 DIAGNOSIS — K219 Gastro-esophageal reflux disease without esophagitis: Secondary | ICD-10-CM | POA: Diagnosis not present

## 2023-06-26 DIAGNOSIS — Z8673 Personal history of transient ischemic attack (TIA), and cerebral infarction without residual deficits: Secondary | ICD-10-CM | POA: Diagnosis not present

## 2023-06-26 DIAGNOSIS — J439 Emphysema, unspecified: Secondary | ICD-10-CM | POA: Diagnosis not present

## 2023-06-26 DIAGNOSIS — J449 Chronic obstructive pulmonary disease, unspecified: Secondary | ICD-10-CM | POA: Diagnosis not present

## 2023-06-26 DIAGNOSIS — I6930 Unspecified sequelae of cerebral infarction: Secondary | ICD-10-CM | POA: Diagnosis not present

## 2023-06-26 DIAGNOSIS — J9601 Acute respiratory failure with hypoxia: Secondary | ICD-10-CM | POA: Diagnosis not present

## 2023-06-26 DIAGNOSIS — E785 Hyperlipidemia, unspecified: Secondary | ICD-10-CM | POA: Diagnosis not present

## 2023-06-26 DIAGNOSIS — I712 Thoracic aortic aneurysm, without rupture, unspecified: Secondary | ICD-10-CM | POA: Diagnosis not present

## 2023-06-26 DIAGNOSIS — R0902 Hypoxemia: Secondary | ICD-10-CM | POA: Diagnosis not present

## 2023-06-26 DIAGNOSIS — J4489 Other specified chronic obstructive pulmonary disease: Secondary | ICD-10-CM | POA: Diagnosis not present

## 2023-06-26 DIAGNOSIS — I10 Essential (primary) hypertension: Secondary | ICD-10-CM | POA: Diagnosis not present

## 2023-06-26 DIAGNOSIS — R296 Repeated falls: Secondary | ICD-10-CM | POA: Diagnosis not present

## 2023-06-26 MED ORDER — MELATONIN 5 MG PO TABS: 5.0000 mg | ORAL_TABLET | Freq: Every evening | ORAL | Status: DC | PRN

## 2023-06-26 MED ORDER — ARFORMOTEROL TARTRATE 15 MCG/2ML IN NEBU: 15.0000 ug | INHALATION_SOLUTION | Freq: Two times a day (BID) | RESPIRATORY_TRACT | Status: DC

## 2023-06-26 MED ORDER — BUDESONIDE 0.25 MG/2ML IN SUSP: 0.2500 mg | Freq: Two times a day (BID) | RESPIRATORY_TRACT | Status: DC

## 2023-06-26 MED ORDER — GUAIFENESIN 100 MG/5ML PO LIQD: 15.0000 mL | Freq: Four times a day (QID) | ORAL | Status: DC

## 2023-06-26 MED ORDER — LEVALBUTEROL HCL 0.63 MG/3ML IN NEBU
0.6300 mg | INHALATION_SOLUTION | Freq: Three times a day (TID) | RESPIRATORY_TRACT | Status: DC
  Administered 2023-06-26 (×2): 0.63 mg via RESPIRATORY_TRACT
  Filled 2023-06-26 (×2): qty 3

## 2023-06-26 MED ORDER — TIOTROPIUM BROMIDE MONOHYDRATE 18 MCG IN CAPS: 18.0000 ug | ORAL_CAPSULE | Freq: Every day | RESPIRATORY_TRACT | 2 refills | Status: DC

## 2023-06-26 MED ORDER — IPRATROPIUM BROMIDE 0.02 % IN SOLN
0.5000 mg | Freq: Three times a day (TID) | RESPIRATORY_TRACT | Status: DC
  Administered 2023-06-26 (×2): 0.5 mg via RESPIRATORY_TRACT
  Filled 2023-06-26 (×2): qty 2.5

## 2023-06-26 NOTE — Progress Notes (Signed)
 Physical Therapy Treatment Patient Details Name: Steven Guerrero MRN: [id number] DOB: 11-17-1940 Today's Date: 06/26/2023   History of Present Illness Pt is 83 yo male who presents on 06/15/23 with SOB, wheezing, and productive cough. CT concerning for PNA. Code sepsis called in ED. PMH: CVA, TBI 2003, PE, OSA not on CPAP, CKD3, GERD, COPD/ emphysema, chronic bronchitis, lung nodules, R THA    PT Comments  Patient progressing to in room ambulation and with SpO2 95% after ambulating on 3L O2.  Much improved activity tolerance though still with significant weakness needing mod A for sit to stand and help to stay in walker during turns with short distance ambulation in the room.  Feel he remains appropriate for inpatient rehab (<3 hours/day) at d/c.     If plan is discharge home, recommend the following: A little help with walking and/or transfers;A little help with bathing/dressing/bathroom;Assistance with cooking/housework;Assist for transportation;Help with stairs or ramp for entrance   Can travel by private vehicle        Equipment Recommendations  Other (comment) (TBA)    Recommendations for Other Services       Precautions / Restrictions Precautions Precautions: Fall Precaution/Restrictions Comments: monitor SpO2     Mobility  Bed Mobility               General bed mobility comments: in recliner waiting for transport to rehab    Transfers Overall transfer level: Needs assistance Equipment used: Rolling walker (2 wheels) Transfers: Sit to/from Stand Sit to Stand: Mod assist, Min assist           General transfer comment: lifting help from recliner, less assistance from EOB at higher surface    Ambulation/Gait Ambulation/Gait assistance: Min assist Gait Distance (Feet): 22 Feet Assistive device: Rolling walker (2 wheels) Gait Pattern/deviations: Step-to pattern, Step-through pattern, Decreased stride length, Shuffle, Trunk flexed       General Gait Details:  decreased foot clearance bilaterally   Stairs             Wheelchair Mobility     Tilt Bed    Modified Rankin (Stroke Patients Only)       Balance Overall balance assessment: Needs assistance   Sitting balance-Leahy Scale: Fair     Standing balance support: Bilateral upper extremity supported Standing balance-Leahy Scale: Poor                              Communication Communication Communication: Impaired Factors Affecting Communication: Reduced clarity of speech  Cognition Arousal: Alert Behavior During Therapy: WFL for tasks assessed/performed   PT - Cognitive impairments: No apparent impairments                       PT - Cognition Comments: follows commands well and appropriate for session   Following commands impaired: Only follows one step commands consistently    Cueing    Exercises General Exercises - Upper Extremity Shoulder Flexion: AROM, Both, 5 reps, Seated General Exercises - Lower Extremity Hip Flexion/Marching: AROM, Both, 10 reps, Seated, Strengthening Other Exercises Other Exercises: scapular retraction with deep breaths x 5    General Comments General comments (skin integrity, edema, etc.): SpO2 on 3L after ambulation in room 95%      Pertinent Vitals/Pain Pain Assessment Pain Assessment: No/denies pain    Home Living  Prior Function            PT Goals (current goals can now be found in the care plan section) Progress towards PT goals: Progressing toward goals    Frequency    Min 2X/week      PT Plan      Co-evaluation              AM-PAC PT "6 Clicks" Mobility   Outcome Measure  Help needed turning from your back to your side while in a flat bed without using bedrails?: A Little Help needed moving from lying on your back to sitting on the side of a flat bed without using bedrails?: A Little Help needed moving to and from a bed to a chair (including  a wheelchair)?: A Little Help needed standing up from a chair using your arms (e.g., wheelchair or bedside chair)?: A Lot Help needed to walk in hospital room?: A Little Help needed climbing 3-5 steps with a railing? : Total 6 Click Score: 15    End of Session Equipment Utilized During Treatment: Gait belt;Oxygen Activity Tolerance: Patient tolerated treatment well Patient left: in chair;with call bell/phone within reach;with family/visitor present   PT Visit Diagnosis: Unsteadiness on feet (R26.81);Muscle weakness (generalized) (M62.81);History of falling (Z91.81);Difficulty in walking, not elsewhere classified (R26.2)     Time: 4098-1191 PT Time Calculation (min) (ACUTE ONLY): 16 min  Charges:    $Gait Training: 8-22 mins PT General Charges $$ ACUTE PT VISIT: 1 Visit                     Sheran Lawless, PT Acute Rehabilitation Services [phone] 06/26/2023    [name] 06/26/2023, 4:51 PM

## 2023-06-26 NOTE — Consult Note (Signed)
 [facility] Liaison Consult Note   06/26/2023  Chanse Kagel Mcclaine 10-27-1940 [id number]  [facility] Consult:  SNF  Primary Care Provider:  Merri Brunette, MD with [facility] which follows for [facility] appointments and community [facility] calls   Insurance: [employer]  Patient was reviewed for LLOS 11 days with high risk for unplanned readmission risk score for barriers to care when returning to community  Patient was screened for hospitalization and on behalf of [facility]  Care Coordination to assess for post hospital community care needs.  Patient is being considered for a skilled nursing facility level of care for post hospital transition.  Plan: Will notify [facility] RN who can follow for any known needs for transitional care needs for returning to post facility care coordination needs for returning to community. Currently, patient is for [facility] an affiliated facility.  For questions or referrals, please contact:  Charlesetta Shanks, RN, BSN, CCM [place]  [facility], [facility] Liaison Direct Dial: [phone] or secure chat Email: [email or web address]

## 2023-06-26 NOTE — Plan of Care (Signed)

## 2023-06-26 NOTE — TOC Progression Note (Addendum)
 Transition of Care [facility]) - Progression Note    Patient Details  Name: Steven Guerrero MRN: [id number] Date of Birth: 1940-10-25  [name] CM/SW Contact  Alesia Richards, RN Phone Number: 06/26/2023, 9:43 AM  Clinical Narrative:     CM follow up in [name] is approved,  Auth Plan ID # [id number] Auth ID #[id number] SNF 06/25/2023 06/26/2023-06/30/2023 Approved 5/5/0   CM alert to Dr. Jerral Ralph, will coordinate transfer to [name] SNF. CM follow up call placed to [name], Admissions, [name] regarding approved auth. No answer, CM left message regarding auth approval. CM will complete medical necessity form.   Expected Discharge Plan: Skilled Nursing Facility Barriers to Discharge: Continued Medical Work up, SNF Pending bed offer, English as a second language teacher  Expected Discharge Plan and Services In-house Referral: Clinical Social Work Discharge Planning Services: CM Consult Post Acute Care Choice: [name], commercial, Home Health Living arrangements for the past 2 months: Single Family Home Expected Discharge Date: 06/26/23                           [name] Agency: [name] Date HH Agency Contacted: 06/22/23 Time HH Agency Contacted: 1429 Representative spoke with at [facility] Agency: [name]   Social Determinants of Health (SDOH) Interventions SDOH Screenings   Food Insecurity: No Food Insecurity (06/15/2023)  Housing: High Risk (06/15/2023)  Transportation Needs: No Transportation Needs (06/15/2023)  Utilities: Not At Risk (06/15/2023)  Social Connections: Moderately Isolated (06/15/2023)  Tobacco Use: Medium Risk (06/15/2023)    Readmission Risk Interventions     No data to display

## 2023-06-26 NOTE — Discharge Summary (Signed)
 PATIENT DETAILS Name: Steven Guerrero Age: 83 y.o. Sex: male Date of Birth: 14-Sep-1940 MRN: [id number]. Admitting Physician: Darlin Drop, DO NWG:[id number], Zollie Beckers, MD  Admit Date: 06/14/2023 Discharge date: 06/26/2023  Recommendations for Outpatient Follow-up:  Follow up with PCP in 1-2 weeks Please obtain CMP/CBC in one week Please ensure palliative care follow-up at SNF/outpatient. Follow-up 4 cm ascending aortic aneurysm-needs annual CTA/MRA  Admitted From:  Home  Disposition: Skilled nursing facility   Discharge Condition: fair  CODE STATUS:   Code Status: Limited: Do not attempt resuscitation (DNR) -DNR-LIMITED -Do Not Intubate/DNI    Diet recommendation:  Diet Order             Diet - low sodium heart healthy           DIET DYS 3 Room service appropriate? Yes with Assist; Fluid consistency: Thin  Diet effective now                    Brief Summary: Patient is a 83 y.o.  male with history of VTE on Xarelto, COPD, CVA, chronic dysphagia, OSA not on CPAP-who presented with shortness of breath/cough/wheezing and 1 episode of vomiting-patient was found to have acute hypoxic respiratory failure secondary pneumonia and subsequently admitted to the hospitalist service.   Significant events: 3/23>> admitted TRH-SOB-presumed pneumonia.   Significant studies: 3/23>> CT chest/abdomen/pelvis: Developing infiltrates in lung bases. 3/28>> CT chest: Complete middle lobe collapse-bibasilar consolidation.   Significant microbiology data: 3/23>> COVID/influenza/RSV PCR: Negative 3/23>> blood culture: No growth 3/25>> sputum culture: Normal respiratory flora   Procedures: None   Consults: Palliative care  Brief Hospital Course: Severe sepsis with acute hypoxic respiratory failure secondary to aspiration pneumonia Significant clinical improvement over the past several days Was on heated high flow during the earlier part of this hospitalization-improved with empiric  antibiotics/bronchodilators-as needed Lasix Has completed a course of Zyvox/Zosyn x 7 days-does not require any further antibiotics on discharge Volume status stable Now on around 3-4 L of oxygen-stable for discharge to SNF today Further attempts to slowly decrease/titrate down FiO2 can be done at [facility].     Dysphagia Chronic issue-evaluated by SLP-on dysphagia 3 diet.   History of VTE On Xarelto   CKD stage IIIb At baseline Follow electrolytes periodically   Hypokalemia Repleted.   COPD Scheduled bronchodilators   OSA CPAP nightly   HLD Statin   HTN BP stable Continue amlodipine Resume losartan when able.   BPH Flomax   4.4 cm thoracic ascending aortic aneurysm Incidental finding on CT imaging. Radiology recommending annual CTA/MRA.   BMI: Estimated body mass index is 23.86 kg/m as calculated from the following:   Height as of this encounter: 5\' 11"  (1.803 m).   Weight as of this encounter: 77.6 kg.  Discharge Diagnoses:  Principal Problem:   CAP (community acquired pneumonia) Active Problems:   Thoracic aortic aneurysm without rupture (HCC)   Acute respiratory failure with hypoxia [facility])   Discharge Instructions:  Activity:  As tolerated with Full fall precautions use walker/cane & assistance as needed   Discharge Instructions     Call MD for:  difficulty breathing, headache or visual disturbances   Complete by: As directed    Diet - low sodium heart healthy   Complete by: As directed    Discharge instructions   Complete by: As directed    Follow with Primary MD  Merri Brunette, MD in 1-2 weeks  Please get a complete blood count and chemistry panel checked  by your Primary MD at your next visit, and again as instructed by your Primary MD.  Get Medicines reviewed and adjusted: Please take all your medications with you for your next visit with your Primary MD  Laboratory/radiological data: Please request your Primary MD to go over all hospital  tests and procedure/radiological results at the follow up, please ask your Primary MD to get all Hospital records sent to his/her office.  In some cases, they will be blood work, cultures and biopsy results pending at the time of your discharge. Please request that your primary care M.D. follows up on these results.  Also Note the following: If you experience worsening of your admission symptoms, develop shortness of breath, life threatening emergency, suicidal or homicidal thoughts you must seek medical attention immediately by calling 911 or calling your MD immediately  if symptoms less severe.  You must read complete instructions/literature along with all the possible adverse reactions/side effects for all the Medicines you take and that have been prescribed to you. Take any new Medicines after you have completely understood and accpet all the possible adverse reactions/side effects.   Do not drive when taking Pain medications or sleeping medications (Benzodaizepines)  Do not take more than prescribed Pain, Sleep and Anxiety Medications. It is not advisable to combine anxiety,sleep and pain medications without talking with your primary care practitioner  Special Instructions: If you have smoked or chewed Tobacco  in the last 2 yrs please stop smoking, stop any regular Alcohol  and or any Recreational drug use.  Wear Seat belts while driving.  Please note: You were cared for by a hospitalist during your hospital stay. Once you are discharged, your primary care physician will handle any further medical issues. Please note that NO REFILLS for any discharge medications will be authorized once you are discharged, as it is imperative that you return to your primary care physician (or establish a relationship with a primary care physician if you do not have one) for your post hospital discharge needs so that they can reassess your need for medications and monitor your lab values.   Increase activity  slowly   Complete by: As directed       Allergies as of 06/26/2023       Reactions   Atenolol Anaphylaxis   Per AH   Gabapentin Other (See Comments)   falls   Lotensin [benazepril Hcl] Swelling   Caused tongue to swell   Wellbutrin [bupropion] Other (See Comments)   Mental status changes per records from [facility]        Medication List     STOP taking these medications    losartan 100 MG tablet Commonly known as: COZAAR       TAKE these medications    acetaminophen 500 MG tablet Commonly known as: TYLENOL Take 500-1,000 mg by mouth every 6 (six) hours as needed for moderate pain (pain score 4-6).   amLODipine 5 MG tablet Commonly known as: NORVASC Take 5 mg by mouth daily.   arformoterol 15 MCG/2ML Nebu Commonly known as: BROVANA Take 2 mLs (15 mcg total) by nebulization 2 (two) times daily.   atorvastatin 20 MG tablet Commonly known as: LIPITOR Take 20 mg by mouth daily.   budesonide 0.25 MG/2ML nebulizer solution Commonly known as: PULMICORT Take 2 mLs (0.25 mg total) by nebulization 2 (two) times daily.   citalopram 20 MG tablet Commonly known as: CELEXA Take 20 mg by mouth at bedtime.   cyanocobalamin 1000 MCG tablet  Commonly known as: VITAMIN B12 Take 1,000 mcg by mouth daily.   fish oil-omega-3 fatty acids 1000 MG capsule Take 1 g by mouth daily.   guaiFENesin 100 MG/5ML liquid Commonly known as: ROBITUSSIN Take 15 mLs by mouth every 6 (six) hours.   ipratropium-albuterol 0.5-2.5 (3) MG/3ML Soln Commonly known as: DUONEB Take 3 mLs by nebulization every 6 (six) hours. What changed:  when to take this reasons to take this   IRON-C PO Take 1 tablet by mouth every other day.   melatonin 5 MG Tabs Take 1 tablet (5 mg total) by mouth at bedtime as needed.   mirtazapine 15 MG tablet Commonly known as: REMERON Take 7.5 mg by mouth at bedtime.   multivitamin with minerals Tabs tablet Take 1 tablet by mouth daily.   pantoprazole 40 MG  tablet Commonly known as: PROTONIX Take 40 mg by mouth daily.   Repatha 140 MG/ML Sosy Generic drug: Evolocumab Inject 140 mg into the skin every 14 (fourteen) days.   tamsulosin 0.4 MG Caps capsule Commonly known as: FLOMAX Take 1 capsule by mouth at bedtime.   tiotropium 18 MCG inhalation capsule Commonly known as: Spiriva HandiHaler Place 1 capsule (18 mcg total) into inhaler and inhale daily.   Xarelto 10 MG Tabs tablet Generic drug: rivaroxaban Take 10 mg by mouth daily.        Contact information for follow-up providers     [place] Follow up.   Why: 03/31-- Per [name], agency has accepted and will follow. Contact information: [address] [place] [address] [phone]         Merri Brunette, MD. Schedule an appointment as soon as possible for a visit in 1 week(s).   Specialty: Internal Medicine Contact information: [address] [place] [address] [place] [address] [phone]              Contact information for after-discharge care     Destination     [name] .   Service: Skilled Nursing Contact information: [address] [facility] [address] Address [place] [address] [phone]                    Allergies  Allergen Reactions   Atenolol Anaphylaxis    Per AH   Gabapentin Other (See Comments)    falls   Lotensin [Benazepril Hcl] Swelling    Caused tongue to swell   Wellbutrin [Bupropion] Other (See Comments)    Mental status changes per records from [facility]     Other Procedures/Studies: DG Chest Port 1 View Result Date: 06/21/2023 CLINICAL DATA:  Shortness of breath. EXAM: PORTABLE CHEST 1 VIEW COMPARISON:  CT chest and chest x-ray dated June 19, 2023. FINDINGS: Stable cardiomediastinal silhouette with normal heart size. Similar bibasilar consolidation, worse on the left. Bilateral pleural plaques again noted. No pleural effusion or pneumothorax.  No acute osseous abnormality. IMPRESSION: 1. Similar bibasilar consolidation, worse on the left, concerning for pneumonia. Electronically Signed   By: Obie Dredge M.D.   On: 06/21/2023 09:58   CT CHEST WO CONTRAST Result Date: 06/19/2023 CLINICAL DATA:  Pneumonia, complications suspected. Shortness of breath. EXAM: CT CHEST WITHOUT CONTRAST TECHNIQUE: Multidetector CT imaging of the chest was performed following the standard protocol without IV contrast. RADIATION DOSE REDUCTION: This exam was performed according to the departmental dose-optimization program which includes automated exposure control, adjustment of the mA and/or kV according to patient size and/or use of iterative  reconstruction technique. COMPARISON:  Radiographs 06/19/2023, 06/18/2023 and earlier. Chest CT 06/14/2023 and 12/04/2020. FINDINGS: Cardiovascular: Again demonstrated is extensive atherosclerosis of the aorta, great vessels and coronary arteries. Dilatation of the ascending aorta is suboptimally evaluated due to motion artifact, but grossly stable, measuring approximately 4.2 cm. No acute vascular findings on noncontrast imaging. The heart size is normal. There is no pericardial effusion. Mediastinum/Nodes: There are no enlarged mediastinal, hilar or axillary lymph nodes.Small mediastinal lymph nodes appear unchanged. The thyroid gland, trachea and esophagus demonstrate no significant findings. Lungs/Pleura: No pneumothorax or significant pleural effusion. Fairly extensive calcified pleural plaques are again noted bilaterally. Underlying moderate centrilobular emphysema with diffuse central airway thickening. There is new complete right middle lobe collapse. New dependent consolidation within both lower lobes, suspicious for pneumonia. Upper abdomen: No acute findings are seen in the visualized upper abdomen. Small gallstones are again noted. Grossly stable renal cystic lesions bilaterally for which no specific follow-up imaging is  recommended. Extensive vascular calcifications noted. Musculoskeletal/Chest wall: There is no chest wall mass or suspicious osseous finding. Old rib fractures and mild spondylosis noted. IMPRESSION: 1. New complete right middle lobe collapse and dependent consolidation within both lower lobes, suspicious for multilobar pneumonia. Radiographic follow-up recommended to ensure resolution. 2. Stable calcified pleural plaques bilaterally, consistent with asbestos related pleural disease. 3. Grossly stable dilatation of the ascending aorta. 4. Aortic Atherosclerosis (ICD10-I70.0) and Emphysema (ICD10-J43.9). Electronically Signed   By: Carey Bullocks M.D.   On: 06/19/2023 16:34   DG Chest Port 1 View Result Date: 06/19/2023 CLINICAL DATA:  141880 SOB (shortness of breath) 141880 EXAM: PORTABLE CHEST - 1 VIEW COMPARISON:  the previous day's study FINDINGS: Partial improvement in the patchy basilar opacities. Partially calcified pleural plaques bilaterally as before. Left retrocardiac atelectasis/consolidation stable. Heart size and mediastinal contours are within normal limits. Aortic Atherosclerosis (ICD10-170.0). No effusion. Visualized bones unremarkable. IMPRESSION: Partial improvement in patchy basilar opacities. Electronically Signed   By: Corlis Leak M.D.   On: 06/19/2023 08:35   DG Chest Port 1 View Result Date: 06/18/2023 CLINICAL DATA:  141880 SOB (shortness of breath) 141880 EXAM: PORTABLE CHEST 1 VIEW COMPARISON:  06/17/2023 FINDINGS: Stable heart size. Aortic atherosclerosis. Calcified pleural plaques are present bilaterally. Bibasilar atelectasis with improved aeration of the lung bases. No pleural effusion or pneumothorax. IMPRESSION: Bibasilar atelectasis with improved aeration of the lung bases. Electronically Signed   By: Duanne Guess D.O.   On: 06/18/2023 11:20   DG Chest Port 1V same Day Result Date: 06/17/2023 CLINICAL DATA:  Shortness of breath today.  Cough. EXAM: PORTABLE CHEST 1 VIEW  COMPARISON:  Chest radiographs 06/15/2023, 06/14/2023, 04/30/2023; CT chest 06/14/2023 FINDINGS: Redemonstration of calcific densities overlying the lateral left hemithorax and bilateral pleurodesis diaphragmatic borders consistent with bilateral calcified pleural plaques as seen on prior radiographs and CT. Cardiac silhouette and mediastinal contours are within normal limits with moderate calcification within the aortic arch and descending thoracic aorta. Bilateral basilar interstitial thickening and homogeneous opacities are very similar to multiple prior comparison radiographs and favored to represent chronic scarring and atelectasis. No definite pleural effusion. No pneumothorax. Moderate biapical pleural scarring. No acute skeletal abnormality. IMPRESSION: 1. No acute cardiopulmonary process. 2. Bilateral calcified pleural plaques and chronic bibasilar scarring and atelectasis. Electronically Signed   By: Neita Garnet M.D.   On: 06/17/2023 08:49   DG Swallowing Func-Speech Pathology Result Date: 06/16/2023 Table formatting from the original result was not included. Modified Barium Swallow Study Patient Details Name: Rakeem Colley Constable  MRN: [id number] Date of Birth: [birth year] Today's Date: 06/16/2023 HPI/PMH: HPI: Durwood Dittus Mcchristian is a 83 y.o. male with medical history significant for previous CVA in 2001, TBI in 2003, history of PE on Xarelto, OSA not on CPAP, CKD 3B, GERD, COPD/emphysema, chronic bronchitis, subcentimeter lung nodules followed by pulmonary, presents to the ER from home via EMS with complaints of sudden onset shortness of breath, productive cough, and wheezing today.  Associated with 1 episode of vomiting.  EMS was activated.  Upon EMS assessment, the patient was found to be febrile with a Tmax 103.  Patient took Tylenol prior to arrival to the ER.  En route, he received albuterol nebs, Atrovent nebs, IV Solu-Medrol 125 mg x 1.  Per his wife he has had episodes of chocking on his food.  Eats a soft  diet with small bites.  In the ER, respiratory status appeared to be improving.  CT chest abdomen pelvis revealed bibasilar infiltrates with concern for developing pneumonia. Clinical Impression: Pt presents with a moderate oropharyngeald dysphagia c/b delayed swallow intiation, reduced base of tongue retraction, incomplete larynagel closure, decreased hyolaryngeal elevation and excursion, and diminished sensation.  Also of note pt has a previously idendified Zenker's diverticulum with backflow noted to pyriform sinuses without penetration/aspiration.  Collection of residue remained in diverticulum on esophageal sweep.  These deficits resulted in trace, silent penetration of thin liquid with serial cup sips to the level of the vocal folds.  It was very difficult to clear penetration.  Cued cough was not effective. Following liquid with heavier bolus trials did not clear penetration, but aspiration was not observed.  Pt tolerated serial straws sips, however with good laryngeal closure and no penetration was noted.  During pill simulation there was was prolonged stasis of tablet in vallecula.  Multiple puree boluses and thin liquid was were trialed to clear.  There was aspiration of liquid wash during these trials which was not sensed.  Medications should be given crushed with puree if permissible.  Recommend continuing mechanical soft diet with thin liquids by single cup sips. Consider intevention for Zenker's diverticulum if indicated. DIGEST Swallow Severity Rating*  Safety: 1  Efficiency: 2  Overall Pharyngeal Swallow Severity: 2 1: mild; 2: moderate; 3: severe; 4: profound *The Dynamic Imaging Grade of Swallowing Toxicity is standardized for the head and neck cancer population, however, demonstrates promising clinical applications across populations to standardize the clinical rating of pharyngeal swallow safety and severity. Factors that may increase risk of adverse event in presence of aspiration Rubye Oaks &  Clearance Coots 2021): No data recorded Recommendations/Plan: Swallowing Evaluation Recommendations Swallowing Evaluation Recommendations Recommendations: PO diet PO Diet Recommendation: Dysphagia 3 (Mechanical soft); Thin liquids (Level 0) Liquid Administration via: Cup Medication Administration: Crushed with puree Supervision: Intermittent supervision/cueing for swallowing strategies Swallowing strategies  : Slow rate; Small bites/sips Postural changes: Position pt fully upright for meals; Stay upright 30-60 min after meals Oral care recommendations: Oral care BID (2x/day) Recommended consults: Consider ENT consultation (for Zenker's if indicated) Treatment Plan Treatment Plan Treatment recommendations: Therapy as outlined in treatment plan below Follow-up recommendations: -- (ST at next level of care) Functional status assessment: Patient has had a recent decline in their functional status and demonstrates the ability to make significant improvements in function in a reasonable and predictable amount of time. Treatment frequency: Min 2x/week Treatment duration: 2 weeks Interventions: Aspiration precaution training; Diet toleration management by SLP Recommendations Recommendations for follow up therapy are one component of a multi-disciplinary discharge planning process,  led by the attending physician.  Recommendations may be updated based on patient status, additional functional criteria and insurance authorization. Assessment: Orofacial Exam: Orofacial Exam Oral Cavity - Dentition: Missing dentition Orofacial Anatomy: WFL Oral Motor/Sensory Function: -- (See BSE) Anatomy: Anatomy: -- (Previously identified Zenker's diverticulum) Boluses Administered: Boluses Administered Boluses Administered: Thin liquids (Level 0); Mildly thick liquids (Level 2, nectar thick); Moderately thick liquids (Level 3, honey thick); Puree; Solid  Oral Impairment Domain: Oral Impairment Domain Lip Closure: Interlabial escape, no progression to  anterior lip Tongue control during bolus hold: Posterior escape of less than half of bolus Bolus preparation/mastication: Timely and efficient chewing and mashing Bolus transport/lingual motion: Brisk tongue motion Oral residue: Trace residue lining oral structures Location of oral residue : Tongue Initiation of pharyngeal swallow : Valleculae  Pharyngeal Impairment Domain: Pharyngeal Impairment Domain Soft palate elevation: No bolus between soft palate (SP)/pharyngeal wall (PW) Laryngeal elevation: Partial superior movement of thyroid cartilage/partial approximation of arytenoids to epiglottic petiole Anterior hyoid excursion: Partial anterior movement Epiglottic movement: Complete inversion Laryngeal vestibule closure: Incomplete, narrow column air/contrast in laryngeal vestibule Pharyngeal stripping wave : Present - diminished Pharyngeal contraction (A/P view only): N/A Pharyngoesophageal segment opening: Complete distension and complete duration, no obstruction of flow Tongue base retraction: Narrow column of contrast or air between tongue base and PPW Pharyngeal residue: Collection of residue within or on pharyngeal structures Location of pharyngeal residue: Valleculae; Pyriform sinuses  Esophageal Impairment Domain: Esophageal Impairment Domain Esophageal clearance upright position: Esophageal retention with retrograde flow through the PES (2/2 diverticulum) Pill: Pill Consistency administered: Thin liquids (Level 0); Puree Thin liquids (Level 0): Impaired (see clinical impressions) Puree: Impaired (see clinical impressions) Penetration/Aspiration Scale Score: Penetration/Aspiration Scale Score 1.  Material does not enter airway: Mildly thick liquids (Level 2, nectar thick); Moderately thick liquids (Level 3, honey thick); Puree; Solid; Pill 5.  Material enters airway, CONTACTS cords and not ejected out: Thin liquids (Level 0) 8.  Material enters airway, passes BELOW cords without attempt by patient to eject  out (silent aspiration) : Thin liquids (Level 0) (During pill simulation only) Compensatory Strategies: Compensatory Strategies Compensatory strategies: Yes Supraglottic swallow: Ineffective Ineffective Supraglottic Swallow: Thin liquid (Level 0) Other(comment): Ineffective (cough) Ineffective Other(comment): Thin liquid (Level 0)   General Information: No data recorded Diet Prior to this Study: Dysphagia 3 (mechanical soft); Thin liquids (Level 0)   Temperature : Normal   Respiratory Status: Desaturations (with repositioning from bed to chair and back)   Supplemental O2: Nasal cannula   History of Recent Intubation: No  Behavior/Cognition: Alert; Cooperative; Pleasant mood No data recorded Baseline vocal quality/speech: Normal No data recorded Volitional Swallow: Able to elicit Exam Limitations: No limitations Goal Planning: Prognosis for improved oropharyngeal function: Good No data recorded No data recorded Patient/Family Stated Goal: not stated Consulted and agree with results and recommendations: Patient Pain: Pain Assessment Pain Assessment: Faces Faces Pain Scale: 4 Pain Location: throat, chest Pain Descriptors / Indicators: Aching; Sore Pain Intervention(s): Limited activity within patient's tolerance; Monitored during session; Repositioned End of Session: Start Time:SLP Start Time (ACUTE ONLY): 0913 Stop Time: SLP Stop Time (ACUTE ONLY): 0925 Time Calculation:SLP Time Calculation (min) (ACUTE ONLY): 12 min Charges: SLP Evaluations $ SLP Speech Visit: 1 Visit SLP Evaluations $BSS Swallow: 1 Procedure $MBS Swallow: 1 Procedure SLP visit diagnosis: SLP Visit Diagnosis: Dysphagia, oropharyngeal phase (R13.12) Past Medical History: Past Medical History: Diagnosis Date  Anxiety and depression   Back pain   Brain bleed (HCC)   2003  CVA (cerebral infarction)   2001  Hypercholesteremia   Hypertension   Peripheral neuropathy   Pulmonary embolism (HCC)   Sleep apnea   Stroke (HCC)   2001  TBI (traumatic brain injury)  (HCC)  Past Surgical History: Past Surgical History: Procedure Laterality Date  HERNIA REPAIR    JOINT REPLACEMENT Right   hip replacement  LEG SURGERY    Broken femur  TOTAL HIP ARTHROPLASTY    Right Kerrie Pleasure, MA, CCC-SLP Acute Rehabilitation Services Office: [phone] 06/16/2023, 10:25 AM  DG CHEST PORT 1 VIEW Result Date: 06/15/2023 CLINICAL DATA:  Abdominal pain. History of stroke, hypertension, pulmonary embolism. EXAM: PORTABLE CHEST 1 VIEW, ABDOMEN ONE VIEW COMPARISON:  08/14/2023 FINDINGS: Chest: The heart is mildly enlarged and mediastinal contours are stable. There is atherosclerotic calcification of the aorta. Calcified pleural plaques are noted bilaterally. Stable opacities are noted in the lungs bilaterally, likely associated with pleural plaques. There is atelectasis at the lung bases. No effusion or pneumothorax is seen. No acute osseous abnormality. Abdomen: There is a nonobstructive bowel-gas pattern. Vascular calcifications are noted in the abdomen and pelvis. Total hip arthroplasty changes are noted on the right. There are degenerative changes in the lumbar spine and left hip. IMPRESSION: 1. Stable chest with no active disease. 2. Nonobstructive bowel-gas pattern. Electronically Signed   By: Thornell Sartorius M.D.   On: 06/15/2023 19:07   DG Abd 1 View Result Date: 06/15/2023 CLINICAL DATA:  Abdominal pain. History of stroke, hypertension, pulmonary embolism. EXAM: PORTABLE CHEST 1 VIEW, ABDOMEN ONE VIEW COMPARISON:  08/14/2023 FINDINGS: Chest: The heart is mildly enlarged and mediastinal contours are stable. There is atherosclerotic calcification of the aorta. Calcified pleural plaques are noted bilaterally. Stable opacities are noted in the lungs bilaterally, likely associated with pleural plaques. There is atelectasis at the lung bases. No effusion or pneumothorax is seen. No acute osseous abnormality. Abdomen: There is a nonobstructive bowel-gas pattern. Vascular calcifications are  noted in the abdomen and pelvis. Total hip arthroplasty changes are noted on the right. There are degenerative changes in the lumbar spine and left hip. IMPRESSION: 1. Stable chest with no active disease. 2. Nonobstructive bowel-gas pattern. Electronically Signed   By: Thornell Sartorius M.D.   On: 06/15/2023 19:07   CT CHEST ABDOMEN PELVIS W CONTRAST Result Date: 06/14/2023 CLINICAL DATA:  Shortness of breath.  Sepsis. EXAM: CT CHEST, ABDOMEN, AND PELVIS WITH CONTRAST TECHNIQUE: Multidetector CT imaging of the chest, abdomen and pelvis was performed following the standard protocol during bolus administration of intravenous contrast. RADIATION DOSE REDUCTION: This exam was performed according to the departmental dose-optimization program which includes automated exposure control, adjustment of the mA and/or kV according to patient size and/or use of iterative reconstruction technique. CONTRAST:  75mL OMNIPAQUE IOHEXOL 350 MG/ML SOLN COMPARISON:  Chest radiograph 06/14/2023. CT chest abdomen and pelvis 12/04/2020 FINDINGS: CT CHEST FINDINGS Cardiovascular: Cardiac enlargement. Trace pericardial effusion. Ascending aortic aneurysm measuring 4.4 cm diameter. No significant change since prior study by my measurement. No aortic dissection. Calcification of the aorta and coronary arteries. Great vessel origins are patent. Mediastinum/Nodes: Thyroid gland is unremarkable. Esophagus is decompressed. No significant lymphadenopathy. Lungs/Pleura: Bilateral calcified pleural plaques may indicate prior asbestos exposure. Mild subpleural and apical scarring. Increased opacities in the lung bases may represent atelectasis or active alveolitis in the setting of fibrosis. Emphysematous changes throughout the lungs. Bronchial wall thickening suggesting chronic bronchitis. No pleural effusion or pneumothorax. Musculoskeletal: No chest wall mass or suspicious bone lesions identified. Old  rib fractures. CT ABDOMEN PELVIS FINDINGS  Hepatobiliary: Cholelithiasis. No inflammatory changes. No bile duct dilatation. No focal liver lesions. Pancreas: Unremarkable. No pancreatic ductal dilatation or surrounding inflammatory changes. Spleen: Normal in size without focal abnormality. Calcified granulomas. Adrenals/Urinary Tract: Prominent bilateral renal artery and intrarenal arterial calcifications. Nephrograms are symmetrical. Subcentimeter cysts without change. No imaging follow-up is indicated. No hydronephrosis or hydroureter. Bladder wall trabeculation and cellule formation likely representing chronic outlet obstruction. Stomach/Bowel: Stomach, small bowel, and colon are not abnormally distended. No wall thickening or inflammatory changes. Scattered colonic diverticula without evidence of acute diverticulitis. Appendix is not identified. Vascular/Lymphatic: Aortic atherosclerosis. No enlarged abdominal or pelvic lymph nodes. Reproductive: Prostate gland is enlarged. Other: Small left inguinal hernia containing fat. No free air or free fluid in the abdomen. Musculoskeletal: Anterior compression of the L2 vertebra with about 50% loss of height. This is new since prior study and could represent an acute fracture. Degenerative changes in the lumbar spine. No destructive bone lesions. Degenerative changes in the left hip. Prior right hip replacement. IMPRESSION: 1. Evidence of chronic lung disease with emphysema, chronic bronchitic changes, and subpleural fibrosis. 2. Developing infiltrates in the lung bases, possibly pneumonia or atelectasis. 3. Calcified pleural plaques bilaterally. 4. 4.4 cm diameter thoracic ascending aortic aneurysm. Recommend annual imaging followup by CTA or MRA. This recommendation follows 2010 ACCF/AHA/AATS/ACR/ASA/SCA/SCAI/SIR/STS/SVM Guidelines for the Diagnosis and Management of Patients with Thoracic Aortic Disease. Circulation. 2010; 121: Z610-R604. Aortic aneurysm NOS (ICD10-I71.9) 5. Cholelithiasis without evidence of  acute cholecystitis. 6. Aortic atherosclerosis. 7. Enlarged prostate gland. Cellule formation in the bladder may indicate chronic outlet obstruction. 8. Anterior compression of the L2 vertebra, new since prior study and possibly acute. Electronically Signed   By: Burman Nieves M.D.   On: 06/14/2023 23:15   DG Chest Port 1 View Result Date: 06/14/2023 CLINICAL DATA:  Possible sepsis EXAM: PORTABLE CHEST 1 VIEW COMPARISON:  04/30/2023, 08/03/2022, 09/09/2019 FINDINGS: Bilateral calcified pleural plaques. Emphysema. Chronic appearing mild reticular opacity at the bases. No acute confluent airspace disease or effusion. Stable cardiomediastinal silhouette with aortic atherosclerosis. No pneumothorax IMPRESSION: No active disease. Emphysema and chronic appearing reticular opacity at the bases. Calcified pleural plaques. Electronically Signed   By: Jasmine Pang M.D.   On: 06/14/2023 20:22     TODAY-DAY OF DISCHARGE:  Subjective:   Steven Guerrero today has no headache,no chest abdominal pain,no new weakness tingling or numbness, feels much better wants to go home today.   Objective:   Blood pressure 125/65, pulse 70, temperature 97.7 F (36.5 C), temperature source Axillary, resp. rate 16, height 5\' 11"  (1.803 m), weight 77.6 kg, SpO2 94%.  Intake/Output Summary (Last 24 hours) at 06/26/2023 0917 Last data filed at 06/26/2023 0816 Gross per 24 hour  Intake 717 ml  Output 500 ml  Net 217 ml   Filed Weights   06/19/23 2322  Weight: 77.6 kg    Exam: Awake Alert, Oriented *3, No new F.N deficits, Normal affect [place].AT,PERRAL Supple Neck,No JVD, No cervical lymphadenopathy appriciated.  Symmetrical Chest wall movement, Good air movement bilaterally, CTAB RRR,No Gallops,Rubs or new Murmurs, No Parasternal Heave +ve B.Sounds, Abd Soft, Non tender, No organomegaly appriciated, No rebound -guarding or rigidity. No Cyanosis, Clubbing or edema, No new Rash or bruise   PERTINENT RADIOLOGIC STUDIES: No  results found.   PERTINENT LAB RESULTS: CBC: Recent Labs    06/24/23 0450  WBC 6.3  HGB 10.8*  HCT 31.6*  PLT 223   CMET CMP  Component Value Date/Time   NA 137 06/25/2023 0506   K 3.5 06/25/2023 0506   CL 100 06/25/2023 0506   CO2 26 06/25/2023 0506   GLUCOSE 84 06/25/2023 0506   BUN 11 06/25/2023 0506   CREATININE 1.46 (H) 06/25/2023 0506   CREATININE 1.48 (H) 01/27/2023 1328   CALCIUM 8.5 (L) 06/25/2023 0506   PROT 5.8 (L) 06/21/2023 0552   ALBUMIN 2.3 (L) 06/21/2023 0552   AST 68 (H) 06/21/2023 0552   AST 19 01/27/2023 1328   ALT 68 (H) 06/21/2023 0552   ALT 15 01/27/2023 1328   ALKPHOS 54 06/21/2023 0552   BILITOT 1.1 06/21/2023 0552   BILITOT 1.0 01/27/2023 1328   GFRNONAA 47 (L) 06/25/2023 0506   GFRNONAA 47 (L) 01/27/2023 1328    GFR Estimated Creatinine Clearance: 40.8 mL/min (A) (by C-G formula based on SCr of 1.46 mg/dL (H)). No results for input(s): "LIPASE", "AMYLASE" in the last 72 hours. No results for input(s): "CKTOTAL", "CKMB", "CKMBINDEX", "TROPONINI" in the last 72 hours. Invalid input(s): "POCBNP" No results for input(s): "DDIMER" in the last 72 hours. No results for input(s): "HGBA1C" in the last 72 hours. No results for input(s): "CHOL", "HDL", "LDLCALC", "TRIG", "CHOLHDL", "LDLDIRECT" in the last 72 hours. No results for input(s): "TSH", "T4TOTAL", "T3FREE", "THYROIDAB" in the last 72 hours.  Invalid input(s): "FREET3" No results for input(s): "VITAMINB12", "FOLATE", "FERRITIN", "TIBC", "IRON", "RETICCTPCT" in the last 72 hours. Coags: No results for input(s): "INR" in the last 72 hours.  Invalid input(s): "PT" Microbiology: No results found for this or any previous visit (from the past 240 hours).  FURTHER DISCHARGE INSTRUCTIONS:  Get Medicines reviewed and adjusted: Please take all your medications with you for your next visit with your Primary MD  Laboratory/radiological data: Please request your Primary MD to go over all  hospital tests and procedure/radiological results at the follow up, please ask your Primary MD to get all Hospital records sent to his/her office.  In some cases, they will be blood work, cultures and biopsy results pending at the time of your discharge. Please request that your primary care M.D. goes through all the records of your hospital data and follows up on these results.  Also Note the following: If you experience worsening of your admission symptoms, develop shortness of breath, life threatening emergency, suicidal or homicidal thoughts you must seek medical attention immediately by calling 911 or calling your MD immediately  if symptoms less severe.  You must read complete instructions/literature along with all the possible adverse reactions/side effects for all the Medicines you take and that have been prescribed to you. Take any new Medicines after you have completely understood and accpet all the possible adverse reactions/side effects.   Do not drive when taking Pain medications or sleeping medications (Benzodaizepines)  Do not take more than prescribed Pain, Sleep and Anxiety Medications. It is not advisable to combine anxiety,sleep and pain medications without talking with your primary care practitioner  Special Instructions: If you have smoked or chewed Tobacco  in the last 2 yrs please stop smoking, stop any regular Alcohol  and or any Recreational drug use.  Wear Seat belts while driving.  Please note: You were cared for by a hospitalist during your hospital stay. Once you are discharged, your primary care physician will handle any further medical issues. Please note that NO REFILLS for any discharge medications will be authorized once you are discharged, as it is imperative that you return to your primary care physician (  or establish a relationship with a primary care physician if you do not have one) for your post hospital discharge needs so that they can reassess your need for  medications and monitor your lab values.  Total Time spent coordinating discharge including counseling, education and face to face time equals greater than 30 minutes.  SignedJeoffrey Massed 06/26/2023 9:17 AM

## 2023-06-26 NOTE — TOC Transition Note (Addendum)
 Transition of Care [facility]) - Discharge Note   Patient Details  Name: Steven Guerrero MRN: [id number] Date of Birth: December 19, 1940  [facility] CM/SW Contact:  Alesia Richards, RN 06/26/2023, 10:08 AM   Clinical Narrative:     Discharge orders noted. Patient to discharge to [facility] SNF today via [facility]. Auth for SNF is approved.  CM follow up call to [name], Admissions regarding auth approval and coordination of transfer to SNF. Call received from [name], Admissions, phone: [phone] regarding patient's wife and request for patient's CPAP to be brought to [facility]. CM alert to RN Cristal Deer regarding RN report number and patient's SNF room number. CM call to patient's wife, Steven Guerrero, phone: [phone] regarding personal CPAP. Per patient's wife, patient was diagnosed in 2010 or 2011 and was informed need for CPAP but patient refused. "He said he wasn't going to do it. And never got it." CM follow up call placed to [name], Admissions regarding patient not obtaining a CPAP. [name] verbalized understanding. CM call to [place], phone; [phone] regarding BLS transport today. CM spoke to [place]. Per Raiford Noble, will add to schedule for pick up.  CM informed per RN Cristal Deer, [facility] has not arrived. CM follow up call placed to [facility] regarding pick up. CM spoke to [place]. Per Raiford Noble, estimated time of arrival is within the hour. CM informed RN Cristal Deer and patient. Patient verbalized understanding. Patient's daughter is at bedside.   Final next level of care: Skilled Nursing Facility Barriers to Discharge: No Barriers Identified   Patient Goals and CMS Choice Patient states their goals for this hospitalization and ongoing recovery are:: Return home CMS Medicare.gov Compare Post Acute Care list provided to:: Patient Choice offered to / list presented to : Patient, Spouse [place] ownership interest in [facility].provided to:: Patient    Discharge Placement                SNF   Discharge Plan and Services Additional resources added to the After Visit Summary for   In-house Referral: Clinical Social Work Discharge Planning Services: CM Consult Post Acute Care Choice: Durable Medical Equipment, Home Health               [facility] Agency: [name] Date [facility] Agency Contacted: 06/22/23 Time HH Agency Contacted: 1429 Representative spoke with at [facility] Agency: [name]  Social Drivers of Health (SDOH) Interventions SDOH Screenings   Food Insecurity: No Food Insecurity (06/15/2023)  Housing: High Risk (06/15/2023)  Transportation Needs: No Transportation Needs (06/15/2023)  Utilities: Not At Risk (06/15/2023)  Social Connections: Moderately Isolated (06/15/2023)  Tobacco Use: Medium Risk (06/15/2023)     Readmission Risk Interventions     No data to display

## 2023-06-30 DIAGNOSIS — I10 Essential (primary) hypertension: Secondary | ICD-10-CM | POA: Diagnosis not present

## 2023-06-30 DIAGNOSIS — R21 Rash and other nonspecific skin eruption: Secondary | ICD-10-CM | POA: Diagnosis not present

## 2023-06-30 DIAGNOSIS — F419 Anxiety disorder, unspecified: Secondary | ICD-10-CM | POA: Diagnosis not present

## 2023-06-30 DIAGNOSIS — J449 Chronic obstructive pulmonary disease, unspecified: Secondary | ICD-10-CM | POA: Diagnosis not present

## 2023-07-02 DIAGNOSIS — G4733 Obstructive sleep apnea (adult) (pediatric): Secondary | ICD-10-CM | POA: Diagnosis not present

## 2023-07-02 DIAGNOSIS — R1312 Dysphagia, oropharyngeal phase: Secondary | ICD-10-CM | POA: Diagnosis not present

## 2023-07-02 DIAGNOSIS — J9601 Acute respiratory failure with hypoxia: Secondary | ICD-10-CM | POA: Diagnosis not present

## 2023-07-02 DIAGNOSIS — E785 Hyperlipidemia, unspecified: Secondary | ICD-10-CM | POA: Diagnosis not present

## 2023-07-02 DIAGNOSIS — A419 Sepsis, unspecified organism: Secondary | ICD-10-CM | POA: Diagnosis not present

## 2023-07-02 DIAGNOSIS — I7121 Aneurysm of the ascending aorta, without rupture: Secondary | ICD-10-CM | POA: Diagnosis not present

## 2023-07-02 DIAGNOSIS — J449 Chronic obstructive pulmonary disease, unspecified: Secondary | ICD-10-CM | POA: Diagnosis not present

## 2023-07-08 DIAGNOSIS — R2242 Localized swelling, mass and lump, left lower limb: Secondary | ICD-10-CM | POA: Diagnosis not present

## 2023-07-08 DIAGNOSIS — L258 Unspecified contact dermatitis due to other agents: Secondary | ICD-10-CM | POA: Diagnosis not present

## 2023-07-09 DIAGNOSIS — R600 Localized edema: Secondary | ICD-10-CM | POA: Diagnosis not present

## 2023-07-09 DIAGNOSIS — M79605 Pain in left leg: Secondary | ICD-10-CM | POA: Diagnosis not present

## 2023-07-10 DIAGNOSIS — R296 Repeated falls: Secondary | ICD-10-CM | POA: Diagnosis not present

## 2023-07-10 DIAGNOSIS — J9601 Acute respiratory failure with hypoxia: Secondary | ICD-10-CM | POA: Diagnosis not present

## 2023-07-10 DIAGNOSIS — E785 Hyperlipidemia, unspecified: Secondary | ICD-10-CM | POA: Diagnosis not present

## 2023-07-10 DIAGNOSIS — I712 Thoracic aortic aneurysm, without rupture, unspecified: Secondary | ICD-10-CM | POA: Diagnosis not present

## 2023-07-10 DIAGNOSIS — Z8673 Personal history of transient ischemic attack (TIA), and cerebral infarction without residual deficits: Secondary | ICD-10-CM | POA: Diagnosis not present

## 2023-07-10 DIAGNOSIS — G629 Polyneuropathy, unspecified: Secondary | ICD-10-CM | POA: Diagnosis not present

## 2023-07-10 DIAGNOSIS — G4733 Obstructive sleep apnea (adult) (pediatric): Secondary | ICD-10-CM | POA: Diagnosis not present

## 2023-07-10 DIAGNOSIS — I10 Essential (primary) hypertension: Secondary | ICD-10-CM | POA: Diagnosis not present

## 2023-07-10 DIAGNOSIS — Z7901 Long term (current) use of anticoagulants: Secondary | ICD-10-CM | POA: Diagnosis not present

## 2023-07-10 DIAGNOSIS — Z9981 Dependence on supplemental oxygen: Secondary | ICD-10-CM | POA: Diagnosis not present

## 2023-07-10 DIAGNOSIS — D649 Anemia, unspecified: Secondary | ICD-10-CM | POA: Diagnosis not present

## 2023-07-10 DIAGNOSIS — J440 Chronic obstructive pulmonary disease with acute lower respiratory infection: Secondary | ICD-10-CM | POA: Diagnosis not present

## 2023-07-10 DIAGNOSIS — R1310 Dysphagia, unspecified: Secondary | ICD-10-CM | POA: Diagnosis not present

## 2023-07-13 DIAGNOSIS — G629 Polyneuropathy, unspecified: Secondary | ICD-10-CM | POA: Diagnosis not present

## 2023-07-13 DIAGNOSIS — J440 Chronic obstructive pulmonary disease with acute lower respiratory infection: Secondary | ICD-10-CM | POA: Diagnosis not present

## 2023-07-13 DIAGNOSIS — D649 Anemia, unspecified: Secondary | ICD-10-CM | POA: Diagnosis not present

## 2023-07-13 DIAGNOSIS — Z7901 Long term (current) use of anticoagulants: Secondary | ICD-10-CM | POA: Diagnosis not present

## 2023-07-13 DIAGNOSIS — G4733 Obstructive sleep apnea (adult) (pediatric): Secondary | ICD-10-CM | POA: Diagnosis not present

## 2023-07-13 DIAGNOSIS — J9601 Acute respiratory failure with hypoxia: Secondary | ICD-10-CM | POA: Diagnosis not present

## 2023-07-13 DIAGNOSIS — I10 Essential (primary) hypertension: Secondary | ICD-10-CM | POA: Diagnosis not present

## 2023-07-13 DIAGNOSIS — R1310 Dysphagia, unspecified: Secondary | ICD-10-CM | POA: Diagnosis not present

## 2023-07-13 DIAGNOSIS — I712 Thoracic aortic aneurysm, without rupture, unspecified: Secondary | ICD-10-CM | POA: Diagnosis not present

## 2023-07-13 DIAGNOSIS — Z9981 Dependence on supplemental oxygen: Secondary | ICD-10-CM | POA: Diagnosis not present

## 2023-07-13 DIAGNOSIS — E785 Hyperlipidemia, unspecified: Secondary | ICD-10-CM | POA: Diagnosis not present

## 2023-07-13 DIAGNOSIS — R296 Repeated falls: Secondary | ICD-10-CM | POA: Diagnosis not present

## 2023-07-13 DIAGNOSIS — Z8673 Personal history of transient ischemic attack (TIA), and cerebral infarction without residual deficits: Secondary | ICD-10-CM | POA: Diagnosis not present

## 2023-07-13 NOTE — Progress Notes (Unsigned)
   Joanna Muck, PhD, LAT, ATC acting as a scribe for Garlan Juniper, MD.  Steven Guerrero is a 83 y.o. male who presents to [facility] at [facility] today for exacerbation of his L shoulder pain. Pt was last seen by Dr. Alease Hunter on 03/09/23 and was given a repeat L GH steroid injection  Today, pt reports **  Dx imaging: 03/09/23 L shoulder XR 11/30/20 L shoulder XR   Pertinent review of systems: ***  Relevant historical information: ***   Exam:  There were no vitals taken for this visit. General: Well Developed, well nourished, and in no acute distress.   MSK: ***    Lab and Radiology Results No results found for this or any previous visit (from the past 72 hours). No results found.     Assessment and Plan: 83 y.o. male with ***   PDMP not reviewed this encounter. No orders of the defined types were placed in this encounter.  No orders of the defined types were placed in this encounter.    Discussed warning signs or symptoms. Please see discharge instructions. Patient expresses understanding.   ***

## 2023-07-14 ENCOUNTER — Other Ambulatory Visit: Payer: Self-pay

## 2023-07-14 ENCOUNTER — Encounter: Payer: Self-pay | Admitting: Family Medicine

## 2023-07-14 ENCOUNTER — Ambulatory Visit: Admitting: Family Medicine

## 2023-07-14 VITALS — BP 146/72 | HR 74 | Ht 71.0 in

## 2023-07-14 DIAGNOSIS — M25512 Pain in left shoulder: Secondary | ICD-10-CM | POA: Diagnosis not present

## 2023-07-14 DIAGNOSIS — G8929 Other chronic pain: Secondary | ICD-10-CM | POA: Diagnosis not present

## 2023-07-14 NOTE — Patient Instructions (Addendum)
 Thank you for coming in today.   You received an injection today. Seek immediate medical attention if the joint becomes red, extremely painful, or is oozing fluid.   We can do this again in 3 months.   If we need to do to a little sooner I could break the rules.

## 2023-07-15 DIAGNOSIS — I7121 Aneurysm of the ascending aorta, without rupture: Secondary | ICD-10-CM | POA: Diagnosis not present

## 2023-07-16 ENCOUNTER — Telehealth (HOSPITAL_BASED_OUTPATIENT_CLINIC_OR_DEPARTMENT_OTHER): Payer: Self-pay | Admitting: Pulmonary Disease

## 2023-07-16 DIAGNOSIS — J432 Centrilobular emphysema: Secondary | ICD-10-CM

## 2023-07-16 LAB — LAB REPORT - SCANNED
EGFR (Non-African Amer.): 52
PSA, Total: 2.2
TSH: 2.09 (ref 0.41–5.90)

## 2023-07-16 MED ORDER — PREDNISONE 10 MG PO TABS: ORAL_TABLET | ORAL | 0 refills | Status: AC

## 2023-07-16 NOTE — Telephone Encounter (Signed)
 Per pts wife he came home last Wed from rehab. He went to see his primary yesterday and they called this morning and said his white count is elevated and his chest xray was abnormal and that he needed to be seen by Pulmonolgy. We have nothing available. I am going to call [facility] @ [phone] and see if they will fax results to us . Please advise

## 2023-07-16 NOTE — Telephone Encounter (Signed)
 [place] Telephone Encounter  Patient was recently seen by his PCP on 07/15/23 for hospital (06/14/23-07/08/23) follow-up for pneumonia followed by rehab at [facility]. Was on Spiriva , arformoterol , budesonide  and ipratropium albuterol  but currently not on any of these prescriptions.  Chest CT 05/24/23 at [facility] with chronic bilateral calcified pleural plaques, moderate emphysema, RML collapse and bibasilar consolidation  CXR 07/15/23 ([facility] report only)- Increased interstitial markings. Bilateral calcified pleural and diaphragmatic plaques.  Pertinent labs:  Cr stable 1.36, previously 1.47 on 09/01/22 WBC 11.8 mildly elevated   Called wife and she reports patient is working on walking. He is on 2L O2 and will drop to 90-93%. Denies shortness of breath sputum production or wheezing. Denies fevers, chills. However wife feels his breathing seems to be struggling. Using Duonebs twice a day.  Plan Prednisone  taper Recommend increasing Duonebs to three times a day Will investigate pricing of other nebulizers to add to his regimen  Addendum:  Arform $12.64 Budes $12.64  Called patient/wife. Discussed pricing. Will order budesonide  0.5/36ml twice a day.

## 2023-07-16 NOTE — Telephone Encounter (Signed)
 [phone] pt gave me wrong number this is correct number for [facility]. Steven Guerrero will fax lab results and chest xray to us 

## 2023-07-16 NOTE — Telephone Encounter (Signed)
 Copied from CRM [id number]. Topic: Appointments - Scheduling Inquiry for Clinic >> Jul 15, 2023  4:38 PM Tyronne Galloway wrote: Reason for CRM: Pt's spouse [name] called regarding his recent hospital visit on 3/23, however she stated the patient then went to a rehab facility and was just discharged last Wednesday on 07/08/2023. Pt sx are both lungs had pneumonia and right lung had collapsed. I attempted to schedule an appointment for [facility] within 14 days, however there were no appointments open. Please call [phone] for scheduling options.

## 2023-07-17 ENCOUNTER — Other Ambulatory Visit (HOSPITAL_BASED_OUTPATIENT_CLINIC_OR_DEPARTMENT_OTHER): Payer: Self-pay

## 2023-07-17 DIAGNOSIS — I10 Essential (primary) hypertension: Secondary | ICD-10-CM | POA: Diagnosis not present

## 2023-07-17 DIAGNOSIS — I712 Thoracic aortic aneurysm, without rupture, unspecified: Secondary | ICD-10-CM | POA: Diagnosis not present

## 2023-07-17 DIAGNOSIS — Z9981 Dependence on supplemental oxygen: Secondary | ICD-10-CM | POA: Diagnosis not present

## 2023-07-17 DIAGNOSIS — R296 Repeated falls: Secondary | ICD-10-CM | POA: Diagnosis not present

## 2023-07-17 DIAGNOSIS — Z8673 Personal history of transient ischemic attack (TIA), and cerebral infarction without residual deficits: Secondary | ICD-10-CM | POA: Diagnosis not present

## 2023-07-17 DIAGNOSIS — Z7901 Long term (current) use of anticoagulants: Secondary | ICD-10-CM | POA: Diagnosis not present

## 2023-07-17 DIAGNOSIS — R1310 Dysphagia, unspecified: Secondary | ICD-10-CM | POA: Diagnosis not present

## 2023-07-17 DIAGNOSIS — J440 Chronic obstructive pulmonary disease with acute lower respiratory infection: Secondary | ICD-10-CM | POA: Diagnosis not present

## 2023-07-17 DIAGNOSIS — J9601 Acute respiratory failure with hypoxia: Secondary | ICD-10-CM | POA: Diagnosis not present

## 2023-07-17 DIAGNOSIS — G629 Polyneuropathy, unspecified: Secondary | ICD-10-CM | POA: Diagnosis not present

## 2023-07-17 DIAGNOSIS — D649 Anemia, unspecified: Secondary | ICD-10-CM | POA: Diagnosis not present

## 2023-07-17 DIAGNOSIS — G4733 Obstructive sleep apnea (adult) (pediatric): Secondary | ICD-10-CM | POA: Diagnosis not present

## 2023-07-17 DIAGNOSIS — E785 Hyperlipidemia, unspecified: Secondary | ICD-10-CM | POA: Diagnosis not present

## 2023-07-17 MED ORDER — BUDESONIDE 0.5 MG/2ML IN SUSP
0.5000 mg | Freq: Two times a day (BID) | RESPIRATORY_TRACT | 5 refills | Status: DC
  Filled 2023-07-17: qty 120, 30d supply, fill #0

## 2023-07-17 NOTE — Addendum Note (Signed)
 Addended by: Quillian Brunt on: 07/17/2023 01:39 PM   Modules accepted: Orders

## 2023-07-21 ENCOUNTER — Telehealth: Payer: Self-pay | Admitting: Cardiovascular Disease

## 2023-07-21 DIAGNOSIS — Z9981 Dependence on supplemental oxygen: Secondary | ICD-10-CM | POA: Diagnosis not present

## 2023-07-21 DIAGNOSIS — J440 Chronic obstructive pulmonary disease with acute lower respiratory infection: Secondary | ICD-10-CM | POA: Diagnosis not present

## 2023-07-21 DIAGNOSIS — G4733 Obstructive sleep apnea (adult) (pediatric): Secondary | ICD-10-CM | POA: Diagnosis not present

## 2023-07-21 DIAGNOSIS — J9601 Acute respiratory failure with hypoxia: Secondary | ICD-10-CM | POA: Diagnosis not present

## 2023-07-21 DIAGNOSIS — Z7901 Long term (current) use of anticoagulants: Secondary | ICD-10-CM | POA: Diagnosis not present

## 2023-07-21 DIAGNOSIS — Z8673 Personal history of transient ischemic attack (TIA), and cerebral infarction without residual deficits: Secondary | ICD-10-CM | POA: Diagnosis not present

## 2023-07-21 DIAGNOSIS — E785 Hyperlipidemia, unspecified: Secondary | ICD-10-CM | POA: Diagnosis not present

## 2023-07-21 DIAGNOSIS — I10 Essential (primary) hypertension: Secondary | ICD-10-CM | POA: Diagnosis not present

## 2023-07-21 DIAGNOSIS — R1310 Dysphagia, unspecified: Secondary | ICD-10-CM | POA: Diagnosis not present

## 2023-07-21 DIAGNOSIS — G629 Polyneuropathy, unspecified: Secondary | ICD-10-CM | POA: Diagnosis not present

## 2023-07-21 DIAGNOSIS — I712 Thoracic aortic aneurysm, without rupture, unspecified: Secondary | ICD-10-CM | POA: Diagnosis not present

## 2023-07-21 DIAGNOSIS — D649 Anemia, unspecified: Secondary | ICD-10-CM | POA: Diagnosis not present

## 2023-07-21 DIAGNOSIS — R296 Repeated falls: Secondary | ICD-10-CM | POA: Diagnosis not present

## 2023-07-21 NOTE — Telephone Encounter (Signed)
 Wife has concerns with aneurysm reaching 4+. She would like a call back to discuss.

## 2023-07-21 NOTE — Telephone Encounter (Signed)
 Patient's wife (DPR), about message. Patient had CT of chest on 06/19/23. Here was part of the results patient's wife saw. Will forward message to Dr. Loetta Ringer to review and advise.    FINDINGS: Cardiovascular: Again demonstrated is extensive atherosclerosis of the aorta, great vessels and coronary arteries. Dilatation of the ascending aorta is suboptimally evaluated due to motion artifact, but grossly stable, measuring approximately 4.2 cm. No acute vascular findings on noncontrast imaging. The heart size is normal. There is no pericardial effusion.

## 2023-07-23 DIAGNOSIS — J9601 Acute respiratory failure with hypoxia: Secondary | ICD-10-CM | POA: Diagnosis not present

## 2023-07-23 DIAGNOSIS — J440 Chronic obstructive pulmonary disease with acute lower respiratory infection: Secondary | ICD-10-CM | POA: Diagnosis not present

## 2023-07-23 DIAGNOSIS — I712 Thoracic aortic aneurysm, without rupture, unspecified: Secondary | ICD-10-CM | POA: Diagnosis not present

## 2023-08-07 NOTE — Progress Notes (Signed)
 Cardiology Clinic Note   Patient Name: Steven Guerrero Date of Encounter: 08/10/2023  Primary Care Provider:  Imelda Man, MD Primary Cardiologist:  Wendie Hamburg, MD  Patient Profile     Steven Guerrero 83 year old male presents to the clinic today for follow-up evaluation of his aortic aneurysm.  Past Medical History    Past Medical History:  Diagnosis Date   Anxiety and depression    Back pain    Brain bleed (HCC)    2003   CVA (cerebral infarction)    2001   Hypercholesteremia    Hypertension    Peripheral neuropathy    Pulmonary embolism (HCC)    Sleep apnea    Stroke (HCC)    2001   TBI (traumatic brain injury) (HCC)    Past Surgical History:  Procedure Laterality Date   HERNIA REPAIR     JOINT REPLACEMENT Right    hip replacement   LEG SURGERY     Broken femur   TOTAL HIP ARTHROPLASTY     Right    Allergies  Allergies  Allergen Reactions   Atenolol Anaphylaxis    Per AH   Gabapentin  Other (See Comments)    falls   Lotensin [Benazepril Hcl] Swelling    Caused tongue to swell   Wellbutrin [Bupropion] Other (See Comments)    Mental status changes per records from [facility]    History of Present Illness     Steven Guerrero has a PMH of essential hypertension, TIA, CVA, thoracic aortic aneurysm, chronic bronchitis, COPD, chronic left shoulder pain, and history of pulmonary embolus.  He was referred to Dr. Loetta Ringer by his PCP for further evaluation of his abdominal aortic aneurysm.  He was seen in follow-up on 10/08/2021.  During that time he denied chest discomfort.  He was noted to have residual left facial droop.  He walked with a walker.  He denied significant changes with his breathing.  His LDL 2/22 was noted to be 17.  His creatinine at that time was 1.61 and 1.77.  His blood pressure was 136/60.       Echocardiogram was ordered and showed normal LVEF, G1 DD, moderately calcified aortic valve with no stenosis and an aortic root measuring 41 mm as  well as mild dilation of the ascending aorta measuring 41 mm.  04/08/2022.  AAA duplex 04/17/2022 showed mid and distal abdominal aorta with the largest aortic measurement of 3.1 cm.   He presented to the clinic 09/08/22 for follow-up evaluation and stated he felt well.  He was sedentary.  We reviewed his EKG from Aug 17, 2022 which shows sinus rhythm right bundle branch block.  His blood pressure was well-controlled at 130/60.  I will continued his current medication regimen and request recent lab work from his PCP.    He was seen in follow-up by Dr. Loetta Ringer on 03/10/2023.  He continued to do well at that time.  He continued to have right facial droop.  He was compliant with his statin therapy and Repatha.  Follow-up was planned in 6 months.  Patient's wife contacted the cardiology clinic on 07/21/2023.  She reported her husband had a chest CT on 06/19/2023.  He was noted to have extensive atherosclerosis of his aorta and coronary arteries.  He was also noted to have dilation of his ascending aorta.  It grossly measured 4.2 cm.  No acute findings on noncontrast imaging.  Heart was normal size.  No pleural effusions were noted.  He presents to the clinic today for evaluation and states he is doing well.  He is recovering from his hospitalization and time in rehab.  He presents with his wife.  She states that he is returned to his normal physical activities.  He was noted to have some crackles in his right lung base.  These were cleared with coughing and deep breathing.  He continues to use 2-1/2 L at night while sleeping.  We reviewed his recent chest CT.  They expressed understanding.  His aortic dilation remained stable.  His blood pressure is well-controlled.  We reviewed options for new primary cardiologist.  I will plan follow-up in 6 to 9 months.  Today he denies chest pain, shortness of breath, lower extremity edema, fatigue, palpitations, melena, hematuria, hemoptysis, diaphoresis, weakness, presyncope,  syncope, orthopnea, and PND.     Home Medications    Prior to Admission medications   Medication Sig Start Date End Date Taking? Authorizing Provider  acetaminophen  (TYLENOL ) 500 MG tablet Take 500-1,000 mg by mouth every 6 (six) hours as needed for moderate pain (pain score 4-6).    [provider]  amLODipine  (NORVASC ) 5 MG tablet Take 5 mg by mouth daily. 07/21/22   [provider]  arformoterol  (BROVANA ) 15 MCG/2ML NEBU Take 2 mLs (15 mcg total) by nebulization 2 (two) times daily. 06/26/23   Ghimire, Estil Heman, MD  atorvastatin  (LIPITOR) 20 MG tablet Take 20 mg by mouth daily.     [provider]  budesonide  (PULMICORT ) 0.25 MG/2ML nebulizer solution Take 2 mLs (0.25 mg total) by nebulization 2 (two) times daily. 06/26/23   Ghimire, Estil Heman, MD  budesonide  (PULMICORT ) 0.5 MG/2ML nebulizer solution Take 2 mLs (0.5 mg total) by nebulization 2 (two) times daily. 07/17/23   Quillian Brunt, MD  citalopram  (CELEXA ) 20 MG tablet Take 20 mg by mouth at bedtime.    [provider]  Evolocumab (REPATHA) 140 MG/ML SOSY Inject 140 mg into the skin every 14 (fourteen) days.    [provider]  Ferrous Gluconate-C-Folic Acid (IRON-C PO) Take 1 tablet by mouth every other day.    [provider]  fish oil-omega-3 fatty acids 1000 MG capsule Take 1 g by mouth daily.    [provider]  guaiFENesin  (ROBITUSSIN) 100 MG/5ML liquid Take 15 mLs by mouth every 6 (six) hours. 06/26/23   Ghimire, Estil Heman, MD  ipratropium-albuterol  (DUONEB) 0.5-2.5 (3) MG/3ML SOLN Take 3 mLs by nebulization every 6 (six) hours. Patient taking differently: Take 3 mLs by nebulization every 6 (six) hours as needed (SOB/Wheezing). 06/03/23   Quillian Brunt, MD  melatonin 5 MG TABS Take 1 tablet (5 mg total) by mouth at bedtime as needed. 06/26/23   Ghimire, Estil Heman, MD  mirtazapine (REMERON) 15 MG tablet Take 7.5 mg by mouth at bedtime. 11/30/20   [provider]   Multiple Vitamin (MULTIVITAMIN WITH MINERALS) TABS tablet Take 1 tablet by mouth daily.    [provider]  pantoprazole  (PROTONIX ) 40 MG tablet Take 40 mg by mouth daily.  02/15/16   [provider]  tamsulosin  (FLOMAX ) 0.4 MG CAPS capsule Take 1 capsule by mouth at bedtime. 08/01/22   [provider]  tiotropium (SPIRIVA  HANDIHALER) 18 MCG inhalation capsule Place 1 capsule (18 mcg total) into inhaler and inhale daily. 06/26/23 06/25/24  Ghimire, Estil Heman, MD  vitamin B-12 (CYANOCOBALAMIN ) 1000 MCG tablet Take 1,000 mcg by mouth daily.    [provider]  XARELTO   10 MG TABS tablet Take 10 mg by mouth daily.    [provider]    Family History    Family History  Problem Relation Age of Onset   Heart attack Father        Mother - health hx unknown   He indicated that his father is deceased.  Social History    Social History   Socioeconomic History   Marital status: Married    Spouse name: Not on file   Number of children: 3   Years of education: HS   Highest education level: Not on file  Occupational History   Occupation: Retired  Tobacco Use   Smoking status: Former    Current packs/day: 0.00    Average packs/day: 1.5 packs/day for 47.0 years (70.5 ttl pk-yrs)    Types: Cigarettes    Start date: 03/25/1959    Quit date: 03/24/2006    Years since quitting: 17.3   Smokeless tobacco: Never  Vaping Use   Vaping status: Never Used  Substance and Sexual Activity   Alcohol use: Yes    Alcohol/week: 0.0 standard drinks of alcohol    Comment: 1 beer per day   Drug use: No   Sexual activity: Not Currently  Other Topics Concern   Not on file  Social History Narrative   Lives at home with wife.   Right-handed.   Three children, one living.   2 cups coffee per day.   Social Drivers of Corporate investment banker Strain: Not on file  Food Insecurity: No Food Insecurity (06/15/2023)   Hunger Vital Sign    Worried About Running Out of  Food in the Last Year: Never true    Ran Out of Food in the Last Year: Never true  Transportation Needs: No Transportation Needs (06/15/2023)   PRAPARE - Administrator, Civil Service (Medical): No    Lack of Transportation (Non-Medical): No  Physical Activity: Not on file  Stress: Not on file  Social Connections: Moderately Isolated (06/15/2023)   Social Connection and Isolation Panel [NHANES]    Frequency of Communication with Friends and Family: Twice a week    Frequency of Social Gatherings with Friends and Family: Twice a week    Attends Religious Services: Never    Database administrator or Organizations: No    Attends Banker Meetings: Never    Marital Status: Married  Catering manager Violence: Not At Risk (06/15/2023)   Humiliation, Afraid, Rape, and Kick questionnaire    Fear of Current or Ex-Partner: No    Emotionally Abused: No    Physically Abused: No    Sexually Abused: No     Review of Systems    General:  No chills, fever, night sweats or weight changes.  Cardiovascular:  No chest pain, dyspnea on exertion, edema, orthopnea, palpitations, paroxysmal nocturnal dyspnea. Dermatological: No rash, lesions/masses Respiratory: No cough, dyspnea Urologic: No hematuria, dysuria Abdominal:   No nausea, vomiting, diarrhea, bright red blood per rectum, melena, or hematemesis Neurologic:  No visual changes, wkns, changes in mental status. All other systems reviewed and are otherwise negative except as noted above.  Physical Exam    VS:  BP 130/70   Pulse 79   Ht 5\' 10"  (1.778 m)   Wt 174 lb 3.2 oz (79 kg)   SpO2 94%   BMI 25.00 kg/m  , BMI Body mass index is 25 kg/m. GEN: Well nourished, well developed, in no acute distress.  HEENT: normal. Neck: Supple, no JVD, carotid bruits, or masses. Cardiac: RRR, no murmurs, rubs, or gallops. No clubbing, cyanosis, edema.  Radials/DP/PT 2+ and equal bilaterally.  Respiratory:  Respirations regular and  unlabored, clear to auscultation bilaterally. GI: Soft, nontender, nondistended, BS + x 4. MS: no deformity or atrophy. Skin: warm and dry, no rash. Neuro:  Strength and sensation are intact. Psych: Normal affect.  Accessory Clinical Findings    Recent Labs: 06/18/2023: B Natriuretic Peptide 91.7 06/21/2023: ALT 68 06/24/2023: Hemoglobin 10.8; Platelets 223 06/25/2023: BUN 11; Creatinine, Ser 1.46; Magnesium  2.0; Potassium 3.5; Sodium 137   Recent Lipid Panel    Component Value Date/Time   CHOL 78 04/03/2018 0539   TRIG 62 04/03/2018 0539   HDL 45 04/03/2018 0539   CHOLHDL 1.7 04/03/2018 0539   VLDL 12 04/03/2018 0539   LDLCALC 21 04/03/2018 0539         ECG personally reviewed by me today-none today.     Echocardiogram 04/08/2022   IMPRESSIONS     1. Left ventricular ejection fraction, by estimation, is 55 to 60%. The  left ventricle has normal function. The left ventricle has no regional  wall motion abnormalities. Left ventricular diastolic parameters are  consistent with Grade I diastolic  dysfunction (impaired relaxation).   2. Right ventricular systolic function is normal. The right ventricular  size is normal. There is normal pulmonary artery systolic pressure. The  estimated right ventricular systolic pressure is 30.7 mmHg.   3. The mitral valve is normal in structure. No evidence of mitral valve  regurgitation. No evidence of mitral stenosis.   4. The aortic valve is tricuspid. There is moderate calcification of the  aortic valve. Aortic valve regurgitation is trivial. No aortic stenosis is  present.   5. Aortic dilatation noted. There is mild dilatation of the aortic root,  measuring 41 mm. There is mild dilatation of the ascending aorta,  measuring 41 mm.   6. The inferior vena cava is normal in size with greater than 50%  respiratory variability, suggesting right atrial pressure of 3 mmHg.   FINDINGS   Left Ventricle: Left ventricular ejection fraction,  by estimation, is 55  to 60%. The left ventricle has normal function. The left ventricle has no  regional wall motion abnormalities. The left ventricular internal cavity  size was normal in size. There is   no left ventricular hypertrophy. Left ventricular diastolic parameters  are consistent with Grade I diastolic dysfunction (impaired relaxation).   Right Ventricle: The right ventricular size is normal. No increase in  right ventricular wall thickness. Right ventricular systolic function is  normal. There is normal pulmonary artery systolic pressure. The tricuspid  regurgitant velocity is 2.63 m/s, and   with an assumed right atrial pressure of 3 mmHg, the estimated right  ventricular systolic pressure is 30.7 mmHg.   Left Atrium: Left atrial size was normal in size.   Right Atrium: Right atrial size was normal in size.   Pericardium: There is no evidence of pericardial effusion.   Mitral Valve: The mitral valve is normal in structure. Mild mitral annular  calcification. No evidence of mitral valve regurgitation. No evidence of  mitral valve stenosis.   Tricuspid Valve: The tricuspid valve is normal in structure. Tricuspid  valve regurgitation is mild.   Aortic Valve: The aortic valve is tricuspid. There is moderate  calcification of the aortic valve. Aortic valve regurgitation is trivial.  Aortic regurgitation PHT measures 428 msec. No aortic stenosis is  present.   Pulmonic Valve: The pulmonic valve was normal in structure. Pulmonic valve  regurgitation is not visualized.   Aorta: Aortic dilatation noted. There is mild dilatation of the aortic  root, measuring 41 mm. There is mild dilatation of the ascending aorta,  measuring 41 mm.   Venous: The inferior vena cava is normal in size with greater than 50%  respiratory variability, suggesting right atrial pressure of 3 mmHg.   IAS/Shunts: No atrial level shunt detected by color flow Doppler.   Chest CT  06/11/2023  COMPARISON:  Radiographs 06/19/2023, 06/18/2023 and earlier. Chest CT 06/14/2023 and 12/04/2020.   FINDINGS: Cardiovascular: Again demonstrated is extensive atherosclerosis of the aorta, great vessels and coronary arteries. Dilatation of the ascending aorta is suboptimally evaluated due to motion artifact, but grossly stable, measuring approximately 4.2 cm. No acute vascular findings on noncontrast imaging. The heart size is normal. There is no pericardial effusion.   Mediastinum/Nodes: There are no enlarged mediastinal, hilar or axillary lymph nodes.Small mediastinal lymph nodes appear unchanged. The thyroid  gland, trachea and esophagus demonstrate no significant findings.   Lungs/Pleura: No pneumothorax or significant pleural effusion. Fairly extensive calcified pleural plaques are again noted bilaterally. Underlying moderate centrilobular emphysema with diffuse central airway thickening. There is new complete right middle lobe collapse. New dependent consolidation within both lower lobes, suspicious for pneumonia.   Upper abdomen: No acute findings are seen in the visualized upper abdomen. Small gallstones are again noted. Grossly stable renal cystic lesions bilaterally for which no specific follow-up imaging is recommended. Extensive vascular calcifications noted.   Musculoskeletal/Chest wall: There is no chest wall mass or suspicious osseous finding. Old rib fractures and mild spondylosis noted.   IMPRESSION: 1. New complete right middle lobe collapse and dependent consolidation within both lower lobes, suspicious for multilobar pneumonia. Radiographic follow-up recommended to ensure resolution. 2. Stable calcified pleural plaques bilaterally, consistent with asbestos related pleural disease. 3. Grossly stable dilatation of the ascending aorta. 4. Aortic Atherosclerosis (ICD10-I70.0) and Emphysema (ICD10-J43.9).     Electronically Signed   By: Elmon Hagedorn M.D.   On: 06/19/2023 16:34   Assessment & Plan   1.  Abdominal aortic dilation-denies episodes of chest and back discomfort.  Echocardiogram 04/08/2022 showed 41 mm mild dilation of ascending aorta.  CT 06/19/2023 showed 4.2 cm ascending aortic aneurysm Maintain good blood pressure control-reviewed Heart healthy low-sodium diet Plan for repeat CT angio chest aorta in 12 mo.   Diastolic CHF-well compensated.  No increased DOE.  Echocardiogram echocardiogram 04/08/2022 showed normal LV EF and G1 DD. Continue healthy low-sodium diet Amlodipine  Physical activity as tolerated  Essential hypertension-BP today 130/70. Maintain blood pressure log Continue amlodipine    Hyperlipidemia-recent labs from PCP requested. Continue Repatha, atorvastatin  High-fiber diet     TIA, CVA-history of CVA in 2001. Follows with PCP   History of pulmonary embolus-compliant with Xarelto .  Denies bleeding issues. Continue Xarelto .   Disposition: Follow-up with Dr. Alda Amas or me in 9 months.   Chet Cota. Kenika Sahm NP-C     08/10/2023, 2:53 PM [facility] [address] Office [phone] Fax [phone]    I spent 14 minutes examining this patient, reviewing medications, and using patient centered shared decision making involving their cardiac care.   I spent  20 minutes reviewing past medical history,  medications, and prior cardiac tests.

## 2023-08-10 ENCOUNTER — Ambulatory Visit: Attending: General Practice | Admitting: General Practice

## 2023-08-10 ENCOUNTER — Encounter: Payer: Self-pay | Admitting: General Practice

## 2023-08-10 VITALS — BP 130/70 | HR 79 | Ht 70.0 in | Wt 174.2 lb

## 2023-08-10 DIAGNOSIS — Z86711 Personal history of pulmonary embolism: Secondary | ICD-10-CM

## 2023-08-10 DIAGNOSIS — I10 Essential (primary) hypertension: Secondary | ICD-10-CM | POA: Diagnosis not present

## 2023-08-10 DIAGNOSIS — I714 Abdominal aortic aneurysm, without rupture, unspecified: Secondary | ICD-10-CM | POA: Diagnosis not present

## 2023-08-10 DIAGNOSIS — I5032 Chronic diastolic (congestive) heart failure: Secondary | ICD-10-CM | POA: Diagnosis not present

## 2023-08-10 DIAGNOSIS — E785 Hyperlipidemia, unspecified: Secondary | ICD-10-CM

## 2023-08-10 DIAGNOSIS — G459 Transient cerebral ischemic attack, unspecified: Secondary | ICD-10-CM

## 2023-08-10 NOTE — Patient Instructions (Signed)
 Medication Instructions:  The current medical regimen is effective;  continue present plan and medications as directed. Please refer to the Current Medication list given to you today. *If you need a refill on your cardiac medications before your next appointment, please call your pharmacy*  Lab Work: NONE If you have labs (blood work) drawn today and your tests are completely normal, you will receive your results only by: MyChart Message (if you have MyChart) OR A paper copy in the mail If you have any lab test that is abnormal or we need to change your treatment, we will call you to review the results.  Testing/Procedures: NOND  Follow-Up: At [facility], you and your health needs are our priority.  As part of our continuing mission to provide you with exceptional heart care, our providers are all part of one team.  This team includes your primary Cardiologist (physician) and Advanced Practice Providers or APPs (Physician Assistants and Nurse Practitioners) who all work together to provide you with the care you need, when you need it.  Your next appointment:   9 month(s)  Provider:   Wendie Hamburg, MD    Other Instructions MAKE SURE TO DO YOUR COUGHING AND DEEP BREATHING EXERCISES AT LEAST 2-3 TIMES DAILY

## 2023-08-18 NOTE — Telephone Encounter (Signed)
 Patient is on atorvastatin  and Repatha for his atherosclerosis.  Aortic root is mildly dilated

## 2023-08-18 NOTE — Telephone Encounter (Signed)
 The patient has been notified of the result and verbalized understanding.  All questions (if any) were answered. Marnell Sinks [place], RN 08/18/2023 3:19 PM

## 2023-09-21 DIAGNOSIS — I10 Essential (primary) hypertension: Secondary | ICD-10-CM | POA: Diagnosis not present

## 2023-09-21 LAB — LAB REPORT - SCANNED
EGFR (Non-African Amer.): 43
TSH: 2.09 (ref 0.41–5.90)

## 2023-09-23 DIAGNOSIS — D229 Melanocytic nevi, unspecified: Secondary | ICD-10-CM | POA: Diagnosis not present

## 2023-09-23 DIAGNOSIS — I6529 Occlusion and stenosis of unspecified carotid artery: Secondary | ICD-10-CM | POA: Diagnosis not present

## 2023-09-23 DIAGNOSIS — K219 Gastro-esophageal reflux disease without esophagitis: Secondary | ICD-10-CM | POA: Diagnosis not present

## 2023-09-23 DIAGNOSIS — I6990 Unspecified sequelae of unspecified cerebrovascular disease: Secondary | ICD-10-CM | POA: Diagnosis not present

## 2023-09-23 DIAGNOSIS — I7121 Aneurysm of the ascending aorta, without rupture: Secondary | ICD-10-CM | POA: Diagnosis not present

## 2023-09-23 DIAGNOSIS — R5383 Other fatigue: Secondary | ICD-10-CM | POA: Diagnosis not present

## 2023-09-23 DIAGNOSIS — J438 Other emphysema: Secondary | ICD-10-CM | POA: Diagnosis not present

## 2023-09-23 DIAGNOSIS — N1832 Chronic kidney disease, stage 3b: Secondary | ICD-10-CM | POA: Diagnosis not present

## 2023-09-23 DIAGNOSIS — D472 Monoclonal gammopathy: Secondary | ICD-10-CM | POA: Diagnosis not present

## 2023-09-23 DIAGNOSIS — I10 Essential (primary) hypertension: Secondary | ICD-10-CM | POA: Diagnosis not present

## 2023-09-23 DIAGNOSIS — E538 Deficiency of other specified B group vitamins: Secondary | ICD-10-CM | POA: Diagnosis not present

## 2023-09-23 DIAGNOSIS — Z0000 Encounter for general adult medical examination without abnormal findings: Secondary | ICD-10-CM | POA: Diagnosis not present

## 2023-10-13 ENCOUNTER — Ambulatory Visit (INDEPENDENT_AMBULATORY_CARE_PROVIDER_SITE_OTHER): Admitting: Family Medicine

## 2023-10-13 ENCOUNTER — Other Ambulatory Visit: Payer: Self-pay

## 2023-10-13 ENCOUNTER — Encounter: Payer: Self-pay | Admitting: Family Medicine

## 2023-10-13 VITALS — BP 120/62 | HR 90 | Ht 70.0 in | Wt 181.0 lb

## 2023-10-13 DIAGNOSIS — G8929 Other chronic pain: Secondary | ICD-10-CM | POA: Diagnosis not present

## 2023-10-13 DIAGNOSIS — D0361 Melanoma in situ of right upper limb, including shoulder: Secondary | ICD-10-CM | POA: Diagnosis not present

## 2023-10-13 DIAGNOSIS — I872 Venous insufficiency (chronic) (peripheral): Secondary | ICD-10-CM | POA: Diagnosis not present

## 2023-10-13 DIAGNOSIS — D225 Melanocytic nevi of trunk: Secondary | ICD-10-CM | POA: Diagnosis not present

## 2023-10-13 DIAGNOSIS — M25512 Pain in left shoulder: Secondary | ICD-10-CM

## 2023-10-13 DIAGNOSIS — L03115 Cellulitis of right lower limb: Secondary | ICD-10-CM | POA: Diagnosis not present

## 2023-10-13 DIAGNOSIS — Z1283 Encounter for screening for malignant neoplasm of skin: Secondary | ICD-10-CM | POA: Diagnosis not present

## 2023-10-13 MED ORDER — CEFDINIR 300 MG PO CAPS: 300.0000 mg | ORAL_CAPSULE | Freq: Two times a day (BID) | ORAL | 0 refills | Status: AC

## 2023-10-13 NOTE — Progress Notes (Signed)
 I, Leotis Batter, CMA acting as a scribe for Artist Lloyd, MD.  Steven Guerrero is a 83 y.o. male who presents to [facility] today for 34-month f/u exacerbation of his L shoulder pain. Pt was last seen by Dr. Lloyd on 07/14/23 and was given a repeat L GH steroid injection  Today, pt reports exacerbation of left shoulder pain x 3 weeks. Sx have responded well to steroid injection in the past.  Also c/o pain in the right lower leg x 3 weeks. Increased warmth and erythema present today. There are several small lacerations on the lower leg. There also appears to be a rash or hives.   Dx imaging: 03/09/23 L shoulder XR 11/30/20 L shoulder XR   Pertinent review of systems: No fevers or chills  Relevant historical information: History of a stroke and chronic left knee pain.  Currently anticoagulated with Eliquis.   Exam:  BP 120/62   Pulse 90   Ht 5' 10 (1.778 m)   Wt 181 lb (82.1 kg)   SpO2 98%   BMI 25.97 kg/m  General: Well Developed, well nourished, and in no acute distress.   MSK: Left shoulder normal-appearing decreased range of motion.  Right lower extremity some erythema and induration visible.  Mild edema which appears equivalent to the other side.  Mildly warm to touch.  Mildly tender to touch.    Lab and Radiology Results  Procedure: Real-time Ultrasound Guided Injection of left shoulder glenohumeral joint posterior approach Device: Philips Affiniti 50G/GE Logiq Images permanently stored and available for review in PACS Verbal informed consent obtained.  Discussed risks and benefits of procedure. Warned about infection, bleeding, hyperglycemia damage to structures among others. Patient expresses understanding and agreement Time-out conducted.   Noted no overlying erythema, induration, or other signs of local infection.   Skin prepped in a sterile fashion.   Local anesthesia: Topical Ethyl chloride.   With sterile technique and under real  time ultrasound guidance: 40 mg of Kenalog  and 2 mL's of Marcaine injected into glenohumeral. Fluid seen entering the joint capsule.   Completed without difficulty   Pain immediately resolved suggesting accurate placement of the medication.   Advised to call if fevers/chills, erythema, induration, drainage, or persistent bleeding.   Images permanently stored and available for review in the ultrasound unit.  Impression: Technically successful ultrasound guided injection.        Assessment and Plan: 83 y.o. male with left shoulder pain due to DJD.  Plan for repeat steroid injection today.  Right lower extremity skin erythema and induration.  This could be cellulitis versus venous stasis dermatitis.  It is a little more warm and tender to touch than I would expect for venous stasis dermatitis.  Plan for treatment with Omnicef  for cellulitis.  This antibiotic should be effective against the bacteria that typically would cause cellulitis.  Am trying to minimize how many antibiotics he is taking som avoiding doxycycline.  Additionally try to make the antibiotics easy to take so 1 pill that is relatively small twice daily is pretty reasonable.  He is seeing a dermatologist later today so that my plan certainly could be changed if the dermatologist feels differently.  I do recommend that he follow-up with his PCP next week.   PDMP not reviewed this encounter. Orders Placed This Encounter  Procedures   US  LIMITED JOINT SPACE STRUCTURES UP LEFT(NO LINKED CHARGES)    Reason for Exam (SYMPTOM  OR DIAGNOSIS REQUIRED):  left shoulder pain    Preferred imaging location?:   [facility] ordered this encounter  Medications   cefdinir  (OMNICEF ) 300 MG capsule    Sig: Take 1 capsule (300 mg total) by mouth 2 (two) times daily for 7 days.    Dispense:  14 capsule    Refill:  0     Discussed warning signs or symptoms. Please see discharge instructions. Patient expresses  understanding.   The above documentation has been reviewed and is accurate and complete Artist Lloyd, M.D.

## 2023-10-13 NOTE — Patient Instructions (Addendum)
 Thank you for coming in today.   Lets see what the dermatologist thinks about your leg  Let me know that I prescribed omnicef  for 1 week but would be happy if they change it.   Can consider repeat injection in 12 weeks.  See you back as needed.

## 2023-10-22 ENCOUNTER — Emergency Department (HOSPITAL_COMMUNITY)

## 2023-10-22 ENCOUNTER — Emergency Department (HOSPITAL_COMMUNITY)
Admission: EM | Admit: 2023-10-22 | Discharge: 2023-10-22 | Disposition: A | Discharge status: Home / Self Care | Attending: Emergency Medicine | Admitting: Emergency Medicine

## 2023-10-22 ENCOUNTER — Encounter (HOSPITAL_COMMUNITY): Payer: Self-pay | Admitting: Emergency Medicine

## 2023-10-22 DIAGNOSIS — I700 Atherosclerosis of aorta: Secondary | ICD-10-CM | POA: Diagnosis not present

## 2023-10-22 DIAGNOSIS — I2510 Atherosclerotic heart disease of native coronary artery without angina pectoris: Secondary | ICD-10-CM | POA: Diagnosis not present

## 2023-10-22 DIAGNOSIS — Y92009 Unspecified place in unspecified non-institutional (private) residence as the place of occurrence of the external cause: Secondary | ICD-10-CM | POA: Diagnosis not present

## 2023-10-22 DIAGNOSIS — Z79899 Other long term (current) drug therapy: Secondary | ICD-10-CM | POA: Diagnosis not present

## 2023-10-22 DIAGNOSIS — J449 Chronic obstructive pulmonary disease, unspecified: Secondary | ICD-10-CM | POA: Diagnosis not present

## 2023-10-22 DIAGNOSIS — W010XXA Fall on same level from slipping, tripping and stumbling without subsequent striking against object, initial encounter: Secondary | ICD-10-CM | POA: Insufficient documentation

## 2023-10-22 DIAGNOSIS — K760 Fatty (change of) liver, not elsewhere classified: Secondary | ICD-10-CM | POA: Diagnosis not present

## 2023-10-22 DIAGNOSIS — M549 Dorsalgia, unspecified: Secondary | ICD-10-CM | POA: Diagnosis not present

## 2023-10-22 DIAGNOSIS — S32020A Wedge compression fracture of second lumbar vertebra, initial encounter for closed fracture: Secondary | ICD-10-CM | POA: Insufficient documentation

## 2023-10-22 DIAGNOSIS — Z043 Encounter for examination and observation following other accident: Secondary | ICD-10-CM | POA: Diagnosis not present

## 2023-10-22 DIAGNOSIS — M25551 Pain in right hip: Secondary | ICD-10-CM | POA: Diagnosis not present

## 2023-10-22 DIAGNOSIS — Z96641 Presence of right artificial hip joint: Secondary | ICD-10-CM | POA: Diagnosis not present

## 2023-10-22 DIAGNOSIS — S41111A Laceration without foreign body of right upper arm, initial encounter: Secondary | ICD-10-CM | POA: Insufficient documentation

## 2023-10-22 DIAGNOSIS — K8020 Calculus of gallbladder without cholecystitis without obstruction: Secondary | ICD-10-CM | POA: Insufficient documentation

## 2023-10-22 DIAGNOSIS — M47814 Spondylosis without myelopathy or radiculopathy, thoracic region: Secondary | ICD-10-CM | POA: Diagnosis not present

## 2023-10-22 DIAGNOSIS — I10 Essential (primary) hypertension: Secondary | ICD-10-CM | POA: Diagnosis not present

## 2023-10-22 DIAGNOSIS — I7121 Aneurysm of the ascending aorta, without rupture: Secondary | ICD-10-CM | POA: Insufficient documentation

## 2023-10-22 DIAGNOSIS — W19XXXA Unspecified fall, initial encounter: Secondary | ICD-10-CM

## 2023-10-22 DIAGNOSIS — M545 Low back pain, unspecified: Secondary | ICD-10-CM | POA: Diagnosis present

## 2023-10-22 DIAGNOSIS — M4856XA Collapsed vertebra, not elsewhere classified, lumbar region, initial encounter for fracture: Secondary | ICD-10-CM | POA: Diagnosis not present

## 2023-10-22 LAB — CBC
HCT: 37.6 % — ABNORMAL LOW (ref 39.0–52.0)
Hemoglobin: 12.4 g/dL — ABNORMAL LOW (ref 13.0–17.0)
MCH: 30.9 pg (ref 26.0–34.0)
MCHC: 33 g/dL (ref 30.0–36.0)
MCV: 93.8 fL (ref 80.0–100.0)
Platelets: 188 K/uL (ref 150–400)
RBC: 4.01 MIL/uL — ABNORMAL LOW (ref 4.22–5.81)
RDW: 13.8 % (ref 11.5–15.5)
WBC: 6.9 K/uL (ref 4.0–10.5)
nRBC: 0 % (ref 0.0–0.2)

## 2023-10-22 LAB — COMPREHENSIVE METABOLIC PANEL WITH GFR
ALT: 21 U/L (ref 0–44)
AST: 22 U/L (ref 15–41)
Albumin: 3.4 g/dL — ABNORMAL LOW (ref 3.5–5.0)
Alkaline Phosphatase: 66 U/L (ref 38–126)
Anion gap: 8 (ref 5–15)
BUN: 17 mg/dL (ref 8–23)
CO2: 21 mmol/L — ABNORMAL LOW (ref 22–32)
Calcium: 8.8 mg/dL — ABNORMAL LOW (ref 8.9–10.3)
Chloride: 111 mmol/L (ref 98–111)
Creatinine, Ser: 1.53 mg/dL — ABNORMAL HIGH (ref 0.61–1.24)
GFR, Estimated: 45 mL/min — ABNORMAL LOW (ref >60)
Glucose, Bld: 98 mg/dL (ref 70–99)
Potassium: 3.7 mmol/L (ref 3.5–5.1)
Sodium: 140 mmol/L (ref 135–145)
Total Bilirubin: 1.5 mg/dL — ABNORMAL HIGH (ref 0.0–1.2)
Total Protein: 6.5 g/dL (ref 6.5–8.1)

## 2023-10-22 LAB — PROTIME-INR
INR: 1.5 — ABNORMAL HIGH (ref 0.8–1.2)
Prothrombin Time: 19.1 s — ABNORMAL HIGH (ref 11.4–15.2)

## 2023-10-22 MED ORDER — IOHEXOL 350 MG/ML SOLN
75.0000 mL | Freq: Once | INTRAVENOUS | Status: AC | PRN
  Administered 2023-10-22: 75 mL via INTRAVENOUS

## 2023-10-22 NOTE — Discharge Instructions (Addendum)
 Thank you for allowing us  to care for you today.  Based upon your CT imaging, there was concern for an acute fracture in your lumbar spine.  Please call Dr. Victory Boers for outpatient neurosurgery spine follow-up.  Please wear your lumbar spinal brace whenever you are out of bed.  Please return to the emergency department if you experience worsening pain, inability to walk with your walker, passout or believe you need emergent medical care  Thank you for allowing us  to care for you. We hope you feel better soon  Lavanda Bolster DO Emergency Medicine PGY2

## 2023-10-22 NOTE — ED Triage Notes (Signed)
 Pt here from home with c/o a trip and fall last Friday and sat at home , pt is on a blood thinner but did not hit his head , pt has a hematoma to the right hip lower back area

## 2023-10-22 NOTE — Progress Notes (Signed)
 Orthopedic Tech Progress Note Patient Details:  Steven Guerrero 07/20/1940 [id number]  Ortho Devices Type of Ortho Device: Lumbar corsett Ortho Device/Splint Location: back Ortho Device/Splint Interventions: Ordered, Application, Adjustment   Post Interventions Patient Tolerated: Fair Instructions Provided: Care of device, Adjustment of device  Taraneh Metheney Ronal Brasil 10/22/2023, 4:44 PM

## 2023-10-22 NOTE — ED Provider Notes (Signed)
[place] [facility]ERGENCY DEPARTMENT AT [facility] Provider Note   CSN: [id number] Arrival date & time: 10/22/23  9051     Patient presents with: Steven Guerrero is a 83 y.o. male past medical history significant for CVA, TBI, hypertension, hypercholesterolemia, PE on Xarelto , known thoracic aortic aneurysm COPD who presents emergency department for lower back pain after fall.  Patient is accompanied by his daughter and wife who to help provide medical history.  Patient's wife states that the patient has had multiple falls at home most recently on Friday and Saturday.  His wife describes these falls as mechanical falls in which she was going to put a truck tailgate back in place however it did not fully click causing him to have a mechanical fall backwards landing onto his sacral area and low back.  The patient states that he did not initially want to be evaluated however did continue having low back and sacral pain causing him to come into the emergency department today.  Patient and patient's wife deny head strike, loss of consciousness.  Patient states he is ambulatory with a walker at baseline and has not had change in ambulation.    Fall       Prior to Admission medications   Medication Sig Start Date End Date Taking? Authorizing Provider  acetaminophen  (TYLENOL ) 500 MG tablet Take 500-1,000 mg by mouth every 6 (six) hours as needed for moderate pain (pain score 4-6).    [provider]  amLODipine  (NORVASC ) 5 MG tablet Take 5 mg by mouth daily. 07/21/22   [provider]  arformoterol  (BROVANA ) 15 MCG/2ML NEBU Take 2 mLs (15 mcg total) by nebulization 2 (two) times daily. 06/26/23   Ghimire, Donalda HERO, MD  atorvastatin  (LIPITOR) 20 MG tablet Take 20 mg by mouth daily.     [provider]  budesonide  (PULMICORT ) 0.25 MG/2ML nebulizer solution Take 2 mLs (0.25 mg total) by nebulization 2 (two) times daily. 06/26/23   Ghimire, Donalda HERO, MD  budesonide   (PULMICORT ) 0.5 MG/2ML nebulizer solution Take 2 mLs (0.5 mg total) by nebulization 2 (two) times daily. 07/17/23   Kassie Acquanetta Bradley, MD  citalopram  (CELEXA ) 20 MG tablet Take 20 mg by mouth at bedtime.    [provider]  Evolocumab (REPATHA) 140 MG/ML SOSY Inject 140 mg into the skin every 14 (fourteen) days.    [provider]  Ferrous Gluconate-C-Folic Acid (IRON-C PO) Take 1 tablet by mouth every other day.    [provider]  fish oil-omega-3 fatty acids 1000 MG capsule Take 1 g by mouth daily.    [provider]  guaiFENesin  (ROBITUSSIN) 100 MG/5ML liquid Take 15 mLs by mouth every 6 (six) hours. 06/26/23   Ghimire, Donalda HERO, MD  ipratropium-albuterol  (DUONEB) 0.5-2.5 (3) MG/3ML SOLN Take 3 mLs by nebulization every 6 (six) hours. Patient taking differently: Take 3 mLs by nebulization every 6 (six) hours as needed (SOB/Wheezing). 06/03/23   Kassie Acquanetta Bradley, MD  melatonin 5 MG TABS Take 1 tablet (5 mg total) by mouth at bedtime as needed. 06/26/23   Ghimire, Donalda HERO, MD  mirtazapine (REMERON) 15 MG tablet Take 7.5 mg by mouth at bedtime. 11/30/20   [provider]  Multiple Vitamin (MULTIVITAMIN WITH MINERALS) TABS tablet Take 1 tablet by mouth daily.    [provider]  pantoprazole  (PROTONIX ) 40 MG tablet Take 40 mg by mouth daily.  02/15/16   [provider]  tamsulosin  (FLOMAX ) 0.4  MG CAPS capsule Take 1 capsule by mouth at bedtime. 08/01/22   [provider]  tiotropium (SPIRIVA  HANDIHALER) 18 MCG inhalation capsule Place 1 capsule (18 mcg total) into inhaler and inhale daily. 06/26/23 06/25/24  Ghimire, Donalda HERO, MD  vitamin B-12 (CYANOCOBALAMIN ) 1000 MCG tablet Take 1,000 mcg by mouth daily.    [provider]  XARELTO  10 MG TABS tablet Take 10 mg by mouth daily.    [provider]    Allergies: Atenolol, Gabapentin , Lotensin [benazepril hcl], and Wellbutrin [bupropion]    Review of Systems Reviewed  in HPI  Updated Vital Signs BP (!) 140/58   Pulse 61   Temp 98.2 F (36.8 C) (Oral)   Resp 18   SpO2 98%   Physical Exam Vitals and nursing note reviewed.  Constitutional:      General: He is not in acute distress. HENT:     Head: Normocephalic and atraumatic.     Right Ear: External ear normal.     Left Ear: External ear normal.     Nose:     Right Nostril: No septal hematoma.     Left Nostril: No septal hematoma.  Eyes:     General: No visual field deficit.    Pupils: Pupils are equal, round, and reactive to light.  Neck:     Comments: No midline cervical spine tenderness, full active range of motion without pain.  No step-offs or deformities, abrasions or lacerations and no ecchymosis Cardiovascular:     Rate and Rhythm: Normal rate and regular rhythm.     Pulses:          Dorsalis pedis pulses are 2+ on the right side and 2+ on the left side.     Heart sounds: Normal heart sounds. No murmur heard. Pulmonary:     Effort: Pulmonary effort is normal.  Chest:     Comments: No chest wall tenderness to palpation, no obvious deformities Abdominal:     General: There is no distension.     Palpations: Abdomen is soft.     Tenderness: There is no abdominal tenderness.     Comments: Old ecchymosis to right flank  Musculoskeletal:     Cervical back: Full passive range of motion without pain and normal range of motion.     Right lower leg: No edema.     Left lower leg: No edema.     Comments: Spontaneous movement of bilateral upper and lower extremities.  Full passive range of motion of bilateral upper and lower extremities.  Scattered ecchymosis that are not acute in origin.  Right upper extremity with nonacute abrasions and skin tears  Skin:    Findings: Abrasion and ecchymosis present.  Neurological:     Mental Status: He is alert.     Sensory: Sensation is intact.     Motor: Motor function is intact.     Coordination: Coordination is intact.     Comments: Bilateral  lower extremities 5/5 strength, sensation intact.  Bilateral upper extremities 5/5 strength, sensation intact.  Dysarthria patient's baseline without acute changes per patient's wife.  Finger-to-nose testing within normal limits  Psychiatric:        Behavior: Behavior is cooperative.     (all labs ordered are listed, but only abnormal results are displayed) Labs Reviewed  PROTIME-INR - Abnormal; Notable for the following components:      Result Value   Prothrombin Time 19.1 (*)    INR 1.5 (*)    All other  components within normal limits  CBC - Abnormal; Notable for the following components:   RBC 4.01 (*)    Hemoglobin 12.4 (*)    HCT 37.6 (*)    All other components within normal limits  COMPREHENSIVE METABOLIC PANEL WITH GFR - Abnormal; Notable for the following components:   CO2 21 (*)    Creatinine, Ser 1.53 (*)    Calcium  8.8 (*)    Albumin 3.4 (*)    Total Bilirubin 1.5 (*)    GFR, Estimated 45 (*)    All other components within normal limits    EKG: None  Radiology: CT CHEST ABDOMEN PELVIS W CONTRAST Result Date: 10/22/2023 CLINICAL DATA:  Status post fall ,hematoma to the right hip lower back area EXAM: CT CHEST, ABDOMEN, AND PELVIS WITH CONTRAST TECHNIQUE: Multidetector CT imaging of the chest, abdomen and pelvis was performed following the standard protocol during bolus administration of intravenous contrast. RADIATION DOSE REDUCTION: This exam was performed according to the departmental dose-optimization program which includes automated exposure control, adjustment of the mA and/or kV according to patient size and/or use of iterative reconstruction technique. CONTRAST:  75mL OMNIPAQUE  IOHEXOL  350 MG/ML SOLN COMPARISON:  Chest CT June 19, 2023, CT chest abdomen pelvis June 14, 2023 FINDINGS: CT CHEST FINDINGS Cardiovascular: Normal heart size. Aneurysmal dilation of ascending aorta measuring 4.4 cm. Severe Atherosclerotic calcifications of coronary arteries. Pleural  plaque along the versus pleural surface along the pericardium. No pericardial effusion. Mediastinum/Nodes: No enlarged mediastinal, hilar, or axillary lymph nodes. Thyroid  gland, trachea, demonstrate no significant findings.Thick-walled patulous esophagus. Lungs/Pleura: Upper lobe predominant moderate centrilobular emphysematous changes. Interpolar septal thickening and mild bronchiolectasis predominantly lower lobe. A focus of scarring in the left lung base measures 2.5 x 1.6 cm (5/115). Diffuse calcified pleural plaques are present. Interval resolution of bilateral pleural effusions. Bronchial and bronchiolar wall thickening. Scattered pulmonary nodules for example in right lower lobe (5/85, 105) up to 5 mm. (Some of these nodules are stable and some were obscured on prior exam due to basilar atelectasis and pleural effusion.) Musculoskeletal: No acute fracture. CT ABDOMEN PELVIS FINDINGS Hepatobiliary: No focal liver abnormality is seen. Hepatic steatosis. Cholelithiasis. No biliary dilatation. Pancreas: Unremarkable. No pancreatic ductal dilatation or surrounding inflammatory changes. Spleen: Normal in size without focal abnormality. Adrenals/Urinary Tract: Adrenal glands are unremarkable. Bilateral simple cortical cysts otherwise kidneys are normal, without renal calculi, focal lesion, or hydronephrosis. Bladder is unremarkable. Stomach/Bowel: Hiatal hernia. Stomach is within normal limits. Sigmoid diverticulosis without diverticulitis. No bowel obstruction. Vascular/Lymphatic: No lymphadenopathy. Severe atherosclerotic calcifications of aorta and branches. Reproductive: Prostatomegaly. Limited evaluation of pelvic cavity due to overlying streak artifact from right hip arthroplasty. Other: Bilateral fat containing inguinal hernias left greater than right. Musculoskeletal: Mild increased attenuation of right gluteal muscle posterior to the right hip joint may represent posttraumatic changes. No discrete hematoma  or fluid collection identified. Soft tissue fat stranding is identified in subcutaneous region posterolateral upper thigh. No acute osseous fracture. Right hip prosthesis in place, femoral neck cerclage identified. No suspicious findings to suggest surgical hardware complication. IMPRESSION: Stigmata of asbestos related disease with interval resolution of pleural effusion and lower lobe atelectasis/consolidations with residual scarring in left lung base and scattered few pulmonary nodules, stable to prior. Follow-up in 1 year to ensure stability. Asymmetric gluteal muscle, larger on the right may represent posttraumatic changes. No acute fracture. Cholelithiasis. Hepatic steatosis. Aneurysmal dilation of ascending aorta measuring 4.4 cm. Recommend annual imaging followup by CTA or MRA. This recommendation follows 2010  ACCF/AHA/AATS/ACR/ASA/SCA/SCAI/SIR/STS/SVM Guidelines for the Diagnosis and Management of Patients with Thoracic Aortic Disease. Circulation. 2010; 121: Z733-z630. Aortic aneurysm NOS (ICD10-I71.9) Severe atherosclerotic calcification of aorta and coronary arteries. Electronically Signed   By: Megan  Zare M.D.   On: 10/22/2023 13:10   CT L-SPINE NO CHARGE Result Date: 10/22/2023 CLINICAL DATA:  Fall.  Back pain. EXAM: CT THORACIC AND LUMBAR SPINE WITHOUT CONTRAST TECHNIQUE: Multidetector CT imaging of the thoracic and lumbar spine was performed without contrast. Multiplanar CT image reconstructions were also generated. RADIATION DOSE REDUCTION: This exam was performed according to the departmental dose-optimization program which includes automated exposure control, adjustment of the mA and/or kV according to patient size and/or use of iterative reconstruction technique. COMPARISON:  Chest CT dated 06/19/2023. lumbar spine radiograph dated 08/04/2022. FINDINGS: CT THORACIC SPINE FINDINGS Alignment: No acute subluxation. Vertebrae: No acute fracture.  Osteopenia. Paraspinal and other soft tissues: No  paraspinal fluid collection or hematoma Disc levels: No acute findings.  Degenerative changes. CT LUMBAR SPINE FINDINGS Segmentation: 5 lumbar type vertebrae. Alignment: No acute subluxation. Vertebrae: Age indeterminate compression fracture of superior endplate of L2 with greater than 50% loss of vertebral body height centrally. This is new since the radiograph of 08/04/2022. Correlation with clinical exam and point tenderness recommended. There is mild buckling of the superior posterior cortex with approximately 3 mm retropulsion. No other acute fracture. The bones are osteopenic. The visualized posterior elements are intact. Paraspinal and other soft tissues: No acute findings. No perispinal fluid collection or hematoma. Atherosclerotic calcification abdominal aorta. See report for the CT of the abdomen pelvis. Disc levels: Degenerative changes with disc desiccation and vacuum phenomena at L1-L2 and L5-S1. IMPRESSION: 1. No acute/traumatic thoracic spine pathology. 2. Age indeterminate compression fracture of superior endplate of L2 with greater than 50% loss of vertebral body height centrally. Correlation with clinical exam and point tenderness recommended. 3.  Aortic Atherosclerosis (ICD10-I70.0). Electronically Signed   By: Vanetta Chou M.D.   On: 10/22/2023 12:46   CT T-SPINE NO CHARGE Result Date: 10/22/2023 CLINICAL DATA:  Fall.  Back pain. EXAM: CT THORACIC AND LUMBAR SPINE WITHOUT CONTRAST TECHNIQUE: Multidetector CT imaging of the thoracic and lumbar spine was performed without contrast. Multiplanar CT image reconstructions were also generated. RADIATION DOSE REDUCTION: This exam was performed according to the departmental dose-optimization program which includes automated exposure control, adjustment of the mA and/or kV according to patient size and/or use of iterative reconstruction technique. COMPARISON:  Chest CT dated 06/19/2023. lumbar spine radiograph dated 08/04/2022. FINDINGS: CT THORACIC  SPINE FINDINGS Alignment: No acute subluxation. Vertebrae: No acute fracture.  Osteopenia. Paraspinal and other soft tissues: No paraspinal fluid collection or hematoma Disc levels: No acute findings.  Degenerative changes. CT LUMBAR SPINE FINDINGS Segmentation: 5 lumbar type vertebrae. Alignment: No acute subluxation. Vertebrae: Age indeterminate compression fracture of superior endplate of L2 with greater than 50% loss of vertebral body height centrally. This is new since the radiograph of 08/04/2022. Correlation with clinical exam and point tenderness recommended. There is mild buckling of the superior posterior cortex with approximately 3 mm retropulsion. No other acute fracture. The bones are osteopenic. The visualized posterior elements are intact. Paraspinal and other soft tissues: No acute findings. No perispinal fluid collection or hematoma. Atherosclerotic calcification abdominal aorta. See report for the CT of the abdomen pelvis. Disc levels: Degenerative changes with disc desiccation and vacuum phenomena at L1-L2 and L5-S1. IMPRESSION: 1. No acute/traumatic thoracic spine pathology. 2. Age indeterminate compression fracture of superior endplate of L2  with greater than 50% loss of vertebral body height centrally. Correlation with clinical exam and point tenderness recommended. 3.  Aortic Atherosclerosis (ICD10-I70.0). Electronically Signed   By: Vanetta Chou M.D.   On: 10/22/2023 12:46   DG Hip Unilat W or Wo Pelvis 2-3 Views Right Result Date: 10/22/2023 CLINICAL DATA:  Fall.  Right hip pain. EXAM: DG HIP (WITH OR WITHOUT PELVIS) 2-3V RIGHT COMPARISON:  09/09/2021. FINDINGS: Pelvis is intact with normal and symmetric sacroiliac joints. No acute fracture or dislocation. No aggressive osseous lesion. Visualized sacral arcuate lines are unremarkable. Unremarkable symphysis pubis. Redemonstration of right total hip arthroplasty with EN cerclage wires. The hardware is intact. No periprosthetic fracture  or lucency. No interval change in alignment, when compared to the available recent prior examination. There are mild degenerative changes of the left hip joint characterized by mild joint space narrowing and osteophytosis of the superior acetabulum. No radiopaque foreign bodies. IMPRESSION: No acute osseous abnormality of the pelvis or right hip joint. Electronically Signed   By: Ree Molt M.D.   On: 10/22/2023 11:04     Procedures   Medications Ordered in the ED  iohexol  (OMNIPAQUE ) 350 MG/ML injection 75 mL (75 mLs Intravenous Contrast Given 10/22/23 1208)    Clinical Course as of 10/22/23 1537  Thu Oct 22, 2023  1123 Hip x-ray reviewed by me: Hips are located with right hip arthroplasty, pelvic ring intact.  No obvious fractures or dislocation [AG]  1259 CT spine: Age indeterminate compression fracture of superior endplate of L2 with greater than 50% loss of vertebral body height centrally   [AG]  1314 NSG consulted [AG]  1448 Will place brace, ambulate and DC with NSG outpt fu [AG]  1514 S- mech fall  L2 compression  Nsr to place brace, ambulate and dc No deficits [RC]    Clinical Course User Index [AG] Nada Chroman, DO [RC] Sharyne Darina RAMAN, MD                                 Medical Decision Making Amount and/or Complexity of Data Reviewed Labs: ordered. Radiology: ordered.  Risk Prescription drug management.   On initial valuation patient is hemodynamically stable, afebrile and not in acute distress.  Based upon patient's history and physical examination findings, will obtain CT imaging to assess for acute injury including fracture, dislocation or active bleeding or hematoma.  Patient does not have evidence of cervical spine tenderness and has full range of motion of cervical spine without tenderness palpation therefore do not believe patient necessitates CT C-spine imaging or cervical spine collar at this time.  Head is normocephalic and atraumatic without focal  neurologic deficits therefore do not believe CT imaging of the head is necessary at this time.  CT imaging with concerns for age-indeterminate L2 compression fracture, with associated tenderness to palpation will consult neurosurgery.  Neurosurgery will order lumbar brace and follow-up in the outpatient setting.  No further workup or imaging is necessary at this time.  Patient will be discharged after ambulation trial with outpatient neurosurgical follow-up     Final diagnoses:  Closed compression fracture of L2 lumbar vertebra, initial encounter [facility])  Accident due to mechanical fall without injury, initial encounter    ED Discharge Orders     None       Chroman Nada DO Emergency Medicine PGY2   Nada Chroman, DO 10/22/23 1537    Pamella Ozell LABOR, DO  10/31/23 1655  

## 2023-10-22 NOTE — ED Notes (Signed)
Awaiting ortho tech for brace

## 2023-10-23 DIAGNOSIS — M549 Dorsalgia, unspecified: Secondary | ICD-10-CM | POA: Diagnosis not present

## 2023-10-28 DIAGNOSIS — D0361 Melanoma in situ of right upper limb, including shoulder: Secondary | ICD-10-CM | POA: Diagnosis not present

## 2023-10-28 DIAGNOSIS — L988 Other specified disorders of the skin and subcutaneous tissue: Secondary | ICD-10-CM | POA: Diagnosis not present

## 2023-11-10 DIAGNOSIS — L570 Actinic keratosis: Secondary | ICD-10-CM | POA: Diagnosis not present

## 2023-11-10 DIAGNOSIS — D044 Carcinoma in situ of skin of scalp and neck: Secondary | ICD-10-CM | POA: Diagnosis not present

## 2023-11-10 DIAGNOSIS — D0439 Carcinoma in situ of skin of other parts of face: Secondary | ICD-10-CM | POA: Diagnosis not present

## 2023-11-10 DIAGNOSIS — X32XXXD Exposure to sunlight, subsequent encounter: Secondary | ICD-10-CM | POA: Diagnosis not present

## 2023-12-21 ENCOUNTER — Ambulatory Visit (HOSPITAL_BASED_OUTPATIENT_CLINIC_OR_DEPARTMENT_OTHER): Admitting: Pulmonary Disease

## 2024-01-01 ENCOUNTER — Emergency Department (HOSPITAL_COMMUNITY)

## 2024-01-01 ENCOUNTER — Observation Stay (HOSPITAL_COMMUNITY)
Admission: EM | Admit: 2024-01-01 | Discharge: 2024-01-02 | Disposition: A | Discharge status: Home / Self Care | Attending: Internal Medicine | Admitting: Internal Medicine

## 2024-01-01 DIAGNOSIS — R519 Headache, unspecified: Secondary | ICD-10-CM | POA: Insufficient documentation

## 2024-01-01 DIAGNOSIS — G319 Degenerative disease of nervous system, unspecified: Secondary | ICD-10-CM | POA: Diagnosis not present

## 2024-01-01 DIAGNOSIS — I10 Essential (primary) hypertension: Secondary | ICD-10-CM | POA: Diagnosis present

## 2024-01-01 DIAGNOSIS — W19XXXA Unspecified fall, initial encounter: Principal | ICD-10-CM | POA: Insufficient documentation

## 2024-01-01 DIAGNOSIS — Z9181 History of falling: Secondary | ICD-10-CM | POA: Diagnosis not present

## 2024-01-01 DIAGNOSIS — Y92003 Bedroom of unspecified non-institutional (private) residence as the place of occurrence of the external cause: Secondary | ICD-10-CM | POA: Insufficient documentation

## 2024-01-01 DIAGNOSIS — M542 Cervicalgia: Secondary | ICD-10-CM | POA: Insufficient documentation

## 2024-01-01 DIAGNOSIS — S32511A Fracture of superior rim of right pubis, initial encounter for closed fracture: Secondary | ICD-10-CM | POA: Diagnosis not present

## 2024-01-01 DIAGNOSIS — J4489 Other specified chronic obstructive pulmonary disease: Secondary | ICD-10-CM | POA: Diagnosis not present

## 2024-01-01 DIAGNOSIS — J439 Emphysema, unspecified: Secondary | ICD-10-CM | POA: Diagnosis present

## 2024-01-01 DIAGNOSIS — S32591A Other specified fracture of right pubis, initial encounter for closed fracture: Secondary | ICD-10-CM | POA: Diagnosis not present

## 2024-01-01 DIAGNOSIS — Z66 Do not resuscitate: Secondary | ICD-10-CM

## 2024-01-01 DIAGNOSIS — S79911A Unspecified injury of right hip, initial encounter: Secondary | ICD-10-CM | POA: Diagnosis not present

## 2024-01-01 DIAGNOSIS — Z79899 Other long term (current) drug therapy: Secondary | ICD-10-CM | POA: Diagnosis not present

## 2024-01-01 DIAGNOSIS — S32501A Unspecified fracture of right pubis, initial encounter for closed fracture: Principal | ICD-10-CM | POA: Insufficient documentation

## 2024-01-01 DIAGNOSIS — M25551 Pain in right hip: Secondary | ICD-10-CM | POA: Diagnosis not present

## 2024-01-01 DIAGNOSIS — R2689 Other abnormalities of gait and mobility: Secondary | ICD-10-CM | POA: Diagnosis not present

## 2024-01-01 DIAGNOSIS — Z7901 Long term (current) use of anticoagulants: Secondary | ICD-10-CM | POA: Diagnosis not present

## 2024-01-01 DIAGNOSIS — Z515 Encounter for palliative care: Secondary | ICD-10-CM

## 2024-01-01 DIAGNOSIS — Z86711 Personal history of pulmonary embolism: Secondary | ICD-10-CM | POA: Diagnosis present

## 2024-01-01 DIAGNOSIS — M47816 Spondylosis without myelopathy or radiculopathy, lumbar region: Secondary | ICD-10-CM | POA: Diagnosis not present

## 2024-01-01 DIAGNOSIS — I6782 Cerebral ischemia: Secondary | ICD-10-CM | POA: Diagnosis not present

## 2024-01-01 DIAGNOSIS — G44309 Post-traumatic headache, unspecified, not intractable: Secondary | ICD-10-CM | POA: Diagnosis not present

## 2024-01-01 DIAGNOSIS — Z96641 Presence of right artificial hip joint: Secondary | ICD-10-CM | POA: Diagnosis not present

## 2024-01-01 LAB — BASIC METABOLIC PANEL WITH GFR
Anion gap: 9 (ref 5–15)
BUN: 22 mg/dL (ref 8–23)
CO2: 21 mmol/L — ABNORMAL LOW (ref 22–32)
Calcium: 9.1 mg/dL (ref 8.9–10.3)
Chloride: 107 mmol/L (ref 98–111)
Creatinine, Ser: 1.37 mg/dL — ABNORMAL HIGH (ref 0.61–1.24)
GFR, Estimated: 51 mL/min — ABNORMAL LOW
Glucose, Bld: 106 mg/dL — ABNORMAL HIGH (ref 70–99)
Potassium: 4.7 mmol/L (ref 3.5–5.1)
Sodium: 137 mmol/L (ref 135–145)

## 2024-01-01 LAB — CBC
HCT: 36.8 % — ABNORMAL LOW (ref 39.0–52.0)
Hemoglobin: 12.2 g/dL — ABNORMAL LOW (ref 13.0–17.0)
MCH: 31.8 pg (ref 26.0–34.0)
MCHC: 33.2 g/dL (ref 30.0–36.0)
MCV: 95.8 fL (ref 80.0–100.0)
Platelets: 184 K/uL (ref 150–400)
RBC: 3.84 MIL/uL — ABNORMAL LOW (ref 4.22–5.81)
RDW: 13.9 % (ref 11.5–15.5)
WBC: 8.1 K/uL (ref 4.0–10.5)
nRBC: 0 % (ref 0.0–0.2)

## 2024-01-01 MED ORDER — FENTANYL CITRATE (PF) 50 MCG/ML IJ SOSY
50.0000 ug | PREFILLED_SYRINGE | Freq: Once | INTRAMUSCULAR | Status: AC
  Administered 2024-01-01: 50 ug via INTRAVENOUS
  Filled 2024-01-01: qty 1

## 2024-01-01 NOTE — ED Notes (Signed)
 Patient transported to CT

## 2024-01-01 NOTE — ED Triage Notes (Signed)
 Pt had witnessed fall at home on [employer] denies hitting head.

## 2024-01-01 NOTE — ED Provider Notes (Signed)
 [place] EMERGENCY DEPARTMENT AT [facility] Provider Note   CSN: [id number] Arrival date & time: 01/01/24  2151     Patient presents with: No chief complaint on file.   Steven Guerrero is a 83 y.o. male with history of back pain, brain bleed, CVA, hypertension, peripheral neuropathy, PE, TBI, chronic left shoulder pain, acute respiratory failure with hypoxia, abnormal gait.  Patient presents to ED for evaluation of fall.  States that he was in bedroom when he attempted to walk from his bed to his closet without using his walker.  States he typically ambulates with walker.  Reports that because of gait instability issues, he fell.  States he landed on right hip.  Denies hitting his head or losing consciousness.  This was not witnessed by family members.  He takes Xarelto  per wife for history of PE.  He reports to ED complaining of pain in right hip.  Denies pain in right ankle, right knee, right upper extremity.  Denies headache, neck pain.  Denies preceding chest pain or shortness of breath, dizziness prior to the fall.  Was on the ground for maybe 5 minutes before being helped by wife to his feet.  Reports history of frequent falls but states he has not fallen in the last 1 week.  HPI     Prior to Admission medications   Medication Sig Start Date End Date Taking? Authorizing Provider  acetaminophen  (TYLENOL ) 500 MG tablet Take 500-1,000 mg by mouth every 6 (six) hours as needed for moderate pain (pain score 4-6).   Yes [provider]  amLODipine  (NORVASC ) 5 MG tablet Take 5 mg by mouth daily. 07/21/22  Yes [provider]  atorvastatin  (LIPITOR) 20 MG tablet Take 20 mg by mouth daily.    Yes [provider]  augmented betamethasone dipropionate (DIPROLENE-AF) 0.05 % ointment Apply 1 Application topically 2 (two) times daily as needed (for psoriasis). 10/13/23  Yes [provider]  citalopram  (CELEXA ) 20 MG tablet Take 20 mg by mouth at bedtime.    Yes [provider]  Evolocumab (REPATHA) 140 MG/ML SOSY Inject 140 mg into the skin every 14 (fourteen) days.   Yes [provider]  Ferrous Gluconate-C-Folic Acid (IRON-C PO) Take 1 tablet by mouth every other day.   Yes [provider]  fish oil-omega-3 fatty acids 1000 MG capsule Take 1 g by mouth daily.   Yes [provider]  guaiFENesin  (ROBITUSSIN) 100 MG/5ML liquid Take 15 mLs by mouth every 6 (six) hours. Patient taking differently: Take 15 mLs by mouth daily as needed for to loosen phlegm. 06/26/23  Yes Ghimire, Donalda HERO, MD  ipratropium-albuterol  (DUONEB) 0.5-2.5 (3) MG/3ML SOLN Take 3 mLs by nebulization every 6 (six) hours. Patient taking differently: Take 3 mLs by nebulization every 6 (six) hours as needed (SOB/Wheezing). 06/03/23  Yes Kassie Acquanetta Bradley, MD  losartan  (COZAAR ) 100 MG tablet Take 100 mg by mouth daily. 09/12/23  Yes [provider]  mirtazapine (REMERON) 15 MG tablet Take 7.5 mg by mouth at bedtime. 11/30/20  Yes [provider]  Multiple Vitamin (MULTIVITAMIN WITH MINERALS) TABS tablet Take 1 tablet by mouth daily.   Yes [provider]  pantoprazole  (PROTONIX ) 40 MG tablet Take 40 mg by mouth daily.  02/15/16  Yes [provider]  tamsulosin  (FLOMAX ) 0.4 MG CAPS capsule Take 1 capsule by mouth at bedtime. 08/01/22  Yes [provider]  vitamin B-12 (CYANOCOBALAMIN ) 1000 MCG tablet Take 1,000 mcg by  mouth daily.   Yes [provider]  XARELTO  10 MG TABS tablet Take 10 mg by mouth daily.   Yes [provider]  budesonide  (PULMICORT ) 0.5 MG/2ML nebulizer solution Take 2 mLs (0.5 mg total) by nebulization 2 (two) times daily. Patient not taking: Reported on 01/02/2024 07/17/23   Kassie Acquanetta Bradley, MD    Allergies: Atenolol, Gabapentin , Lotensin [benazepril hcl], and Wellbutrin [bupropion]    Review of Systems  Musculoskeletal:  Positive for arthralgias and myalgias.  All other  systems reviewed and are negative.   Updated Vital Signs BP (!) 141/81   Pulse 76   Temp 97.8 F (36.6 C) (Oral)   Resp 18   Ht 5' 10 (1.778 m)   Wt 82.1 kg   SpO2 96%   BMI 25.97 kg/m   Physical Exam Vitals and nursing note reviewed.  Constitutional:      General: He is not in acute distress.    Appearance: He is well-developed.  HENT:     Head: Normocephalic and atraumatic.  Eyes:     Conjunctiva/sclera: Conjunctivae normal.  Cardiovascular:     Rate and Rhythm: Normal rate and regular rhythm.     Heart sounds: No murmur heard. Pulmonary:     Effort: Pulmonary effort is normal. No respiratory distress.     Breath sounds: Normal breath sounds.  Abdominal:     Palpations: Abdomen is soft.     Tenderness: There is no abdominal tenderness.  Musculoskeletal:        General: No swelling.     Cervical back: Neck supple.     Comments: No shortening, rotation to right lower extremity.  Tenderness to right lateral hip.  Full ROM right ankle, right knee.  Skin:    General: Skin is warm and dry.     Capillary Refill: Capillary refill takes less than 2 seconds.  Neurological:     Mental Status: He is alert and oriented to person, place, and time. Mental status is at baseline.     Comments: Baseline slurred speech from previous CVA.  Equal strength throughout.  Follows commands appropriately.  Psychiatric:        Mood and Affect: Mood normal.     (all labs ordered are listed, but only abnormal results are displayed) Labs Reviewed  CBC - Abnormal; Notable for the following components:      Result Value   RBC 3.84 (*)    Hemoglobin 12.2 (*)    HCT 36.8 (*)    All other components within normal limits  BASIC METABOLIC PANEL WITH GFR - Abnormal; Notable for the following components:   CO2 21 (*)    Glucose, Bld 106 (*)    Creatinine, Ser 1.37 (*)    GFR, Estimated 51 (*)    All other components within normal limits    EKG: EKG Interpretation Date/Time:  Friday  January 01 2024 23:30:43 EDT Ventricular Rate:  60 PR Interval:  172 QRS Duration:  114 QT Interval:  466 QTC Calculation: 466 R Axis:   51  Text Interpretation: Normal sinus rhythm Incomplete right bundle branch block Anterior infarct , age undetermined Confirmed by Nettie, April ([id number]) on 01/01/2024 11:36:04 PM  Radiology: CT Hip Right Wo Contrast Result Date: 01/01/2024 CLINICAL DATA:  Fall, right hip pain EXAM: CT OF THE RIGHT HIP WITHOUT CONTRAST TECHNIQUE: Multidetector CT imaging of the right hip was performed according to the standard protocol. Multiplanar CT image reconstructions were also generated. RADIATION DOSE REDUCTION: This exam  was performed according to the departmental dose-optimization program which includes automated exposure control, adjustment of the mA and/or kV according to patient size and/or use of iterative reconstruction technique. COMPARISON:  Plain films today. FINDINGS: Bones/Joint/Cartilage Changes of prior right hip replacement. Cerclage wires noted. There is an acute appearing fracture in the right inferior pubic ramus. No additional fracture seen. No subluxation or dislocation. Ligaments Suboptimally assessed by CT. Muscles and Tendons Negative Soft tissues Negative IMPRESSION: Right inferior pubic ramus fracture. Prior right hip replacement. Electronically Signed   By: Franky Crease M.D.   On: 01/01/2024 23:30   DG Hip Unilat W or Wo Pelvis 2-3 Views Right Result Date: 01/01/2024 CLINICAL DATA:  Pain after fall onto the right hip. EXAM: DG HIP (WITH OR WITHOUT PELVIS) 2-3V RIGHT COMPARISON:  10/22/2023 FINDINGS: Postoperative changes with right total hip arthroplasty using long-stem femoral component and multiple cerclage wires. The inferior aspect of the component is not included within the field of view. If the patient has right hip pain, additional images of the femur are recommended to complete the evaluation. As visualized, there is no evidence of acute  fracture or dislocation of the right hip. Diffuse bone deformity consistent with callus formation. Old ununited ossicles. Deformity of the right inferior pubic ramus appears new since prior study and could indicate acute fracture. Pelvis appears otherwise intact. SI joints and symphysis pubis are not displaced. Degenerative changes in the lower lumbar spine and left hip. Vascular calcifications. IMPRESSION: 1. Postoperative right total hip arthroplasty with cerclage wires and associated callus formation and ununited ossicles consistent with old fracture healing. No acute fracture is demonstrated although the inferior aspect of the femoral component is not included in additional views of the right femur are recommended for completion of the examination. 2. Irregularity in the right inferior pubic ramus is new since previous study and could indicate acute fracture. 3. Degenerative changes in the lower lumbar spine and left hip. Electronically Signed   By: Elsie Gravely M.D.   On: 01/01/2024 22:59   CT Head Wo Contrast Result Date: 01/01/2024 CLINICAL DATA:  Recent fall with headaches and neck pain, initial encounter EXAM: CT HEAD WITHOUT CONTRAST CT CERVICAL SPINE WITHOUT CONTRAST TECHNIQUE: Multidetector CT imaging of the head and cervical spine was performed following the standard protocol without intravenous contrast. Multiplanar CT image reconstructions of the cervical spine were also generated. RADIATION DOSE REDUCTION: This exam was performed according to the departmental dose-optimization program which includes automated exposure control, adjustment of the mA and/or kV according to patient size and/or use of iterative reconstruction technique. COMPARISON:  04/30/2023 FINDINGS: CT HEAD FINDINGS Brain: No evidence of acute infarction, hemorrhage, hydrocephalus, extra-axial collection or mass lesion/mass effect. Chronic atrophic and ischemic changes are again seen and stable. Vascular: No hyperdense vessel  or unexpected calcification. Skull: Normal. Negative for fracture or focal lesion. Sinuses/Orbits: No acute finding. Other: None. CT CERVICAL SPINE FINDINGS Alignment: Within normal limits. Skull base and vertebrae: 7 cervical segments are well visualized. Vertebral body height is well maintained. Mild osteophytic changes and facet hypertrophic changes are noted. No acute fracture or acute facet abnormality is noted. The odontoid is within normal limits. Soft tissues and spinal canal: Surrounding soft tissue structures are within normal limits. Upper chest: Visualized lung apices show no acute abnormality. Other: None IMPRESSION: CT of the head: No acute intracranial abnormality noted. Chronic atrophic and ischemic changes are noted. CT of the cervical spine: Multilevel degenerative change without acute abnormality. Electronically Signed  By: Oneil Devonshire M.D.   On: 01/01/2024 22:59   CT Cervical Spine Wo Contrast Result Date: 01/01/2024 CLINICAL DATA:  Recent fall with headaches and neck pain, initial encounter EXAM: CT HEAD WITHOUT CONTRAST CT CERVICAL SPINE WITHOUT CONTRAST TECHNIQUE: Multidetector CT imaging of the head and cervical spine was performed following the standard protocol without intravenous contrast. Multiplanar CT image reconstructions of the cervical spine were also generated. RADIATION DOSE REDUCTION: This exam was performed according to the departmental dose-optimization program which includes automated exposure control, adjustment of the mA and/or kV according to patient size and/or use of iterative reconstruction technique. COMPARISON:  04/30/2023 FINDINGS: CT HEAD FINDINGS Brain: No evidence of acute infarction, hemorrhage, hydrocephalus, extra-axial collection or mass lesion/mass effect. Chronic atrophic and ischemic changes are again seen and stable. Vascular: No hyperdense vessel or unexpected calcification. Skull: Normal. Negative for fracture or focal lesion. Sinuses/Orbits: No  acute finding. Other: None. CT CERVICAL SPINE FINDINGS Alignment: Within normal limits. Skull base and vertebrae: 7 cervical segments are well visualized. Vertebral body height is well maintained. Mild osteophytic changes and facet hypertrophic changes are noted. No acute fracture or acute facet abnormality is noted. The odontoid is within normal limits. Soft tissues and spinal canal: Surrounding soft tissue structures are within normal limits. Upper chest: Visualized lung apices show no acute abnormality. Other: None IMPRESSION: CT of the head: No acute intracranial abnormality noted. Chronic atrophic and ischemic changes are noted. CT of the cervical spine: Multilevel degenerative change without acute abnormality. Electronically Signed   By: Oneil Devonshire M.D.   On: 01/01/2024 22:59    Procedures   Medications Ordered in the ED  fentaNYL (SUBLIMAZE) injection 50 mcg (50 mcg Intravenous Given 01/01/24 2226)  HYDROmorphone  (DILAUDID ) injection 0.5 mg (0.5 mg Intravenous Given 01/02/24 0139)    Medical Decision Making Amount and/or Complexity of Data Reviewed Labs: ordered. Radiology: ordered. ECG/medicine tests: ordered.  Risk Prescription drug management.   This is an 83 year old presenting to the ED with his wife for evaluation after falling.  Triage note notes that this was a witnessed fall however patient and patient wife state that this was unwitnessed.  Patient reports often falling, utilizing walker for ambulation.  States he fell today because he was not using walker.  Patient denies hitting his head.  Reports right hip pain.  On exam, no shortening or rotation of right lower extremity.  Patient labs collected to include CBC, BMP.  Collected CT head, cervical spine as well as plain film imaging of right hip.  Given fentanyl for pain control.  CBC without leukocytosis, no anemia.  Metabolic panel with baseline creatinine, no electrolyte derangement, anion gap 9.  CT head, cervical  spine unremarkable.  Imaging of right hip is inconclusive, concern for possible inferior pubic rami fracture.  CT scan of right hip confirms inferior pubic rami fracture.  At this time, patient wife did call hospice group.  They have advised that there are no beds at this time available.  Patient admitted at this time to Dr. Dena TRH for pain control, PT OT evaluation.  Concerned that this patient will further injure himself if he is to be discharged home as his wife is unable to care for him and help with transitions.  Stable admission.    Final diagnoses:  Fall, initial encounter  Closed fracture of right inferior pubic ramus, initial encounter [facility])    ED Discharge Orders     None  Ruthell Lonni FALCON, PA-C 01/02/24 0140    Cleotilde Rogue, MD 01/03/24 [phone]

## 2024-01-01 NOTE — ED Provider Notes (Incomplete)
 [place] EMERGENCY DEPARTMENT AT [facility] Provider Note   CSN: [id number] Arrival date & time: 01/01/24  2151     Patient presents with: No chief complaint on file.   Steven Guerrero is a 83 y.o. male with history of back pain, brain bleed, CVA, hypertension, peripheral neuropathy, PE, TBI, chronic left shoulder pain, acute respiratory failure with hypoxia, abnormal gait.  Patient presents to ED for evaluation of fall.  States that he was in bedroom when he attempted to walk from his bed to his closet without using his walker.  States he typically ambulates with walker.  Reports that because of gait instability issues, he fell.  States he landed on right hip.  Denies hitting his head or losing consciousness.  This was not witnessed by family members.  He takes Xarelto  per wife for history of PE.  He reports to ED complaining of pain in right hip.  Denies pain in right ankle, right knee, right upper extremity.  Denies headache, neck pain.  Denies preceding chest pain or shortness of breath, dizziness prior to the fall.  Was on the ground for maybe 5 minutes before being helped by wife to his feet.  Reports history of frequent falls but states he has not fallen in the last 1 week.  HPI     Prior to Admission medications   Medication Sig Start Date End Date Taking? Authorizing Provider  acetaminophen  (TYLENOL ) 500 MG tablet Take 500-1,000 mg by mouth every 6 (six) hours as needed for moderate pain (pain score 4-6).    [provider]  amLODipine  (NORVASC ) 5 MG tablet Take 5 mg by mouth daily. 07/21/22   [provider]  arformoterol  (BROVANA ) 15 MCG/2ML NEBU Take 2 mLs (15 mcg total) by nebulization 2 (two) times daily. 06/26/23   Ghimire, Donalda HERO, MD  atorvastatin  (LIPITOR) 20 MG tablet Take 20 mg by mouth daily.     [provider]  budesonide  (PULMICORT ) 0.25 MG/2ML nebulizer solution Take 2 mLs (0.25 mg total) by nebulization 2 (two) times daily. 06/26/23    Ghimire, Donalda HERO, MD  budesonide  (PULMICORT ) 0.5 MG/2ML nebulizer solution Take 2 mLs (0.5 mg total) by nebulization 2 (two) times daily. 07/17/23   Kassie Acquanetta Bradley, MD  citalopram  (CELEXA ) 20 MG tablet Take 20 mg by mouth at bedtime.    [provider]  Evolocumab (REPATHA) 140 MG/ML SOSY Inject 140 mg into the skin every 14 (fourteen) days.    [provider]  Ferrous Gluconate-C-Folic Acid (IRON-C PO) Take 1 tablet by mouth every other day.    [provider]  fish oil-omega-3 fatty acids 1000 MG capsule Take 1 g by mouth daily.    [provider]  guaiFENesin  (ROBITUSSIN) 100 MG/5ML liquid Take 15 mLs by mouth every 6 (six) hours. 06/26/23   Ghimire, Donalda HERO, MD  ipratropium-albuterol  (DUONEB) 0.5-2.5 (3) MG/3ML SOLN Take 3 mLs by nebulization every 6 (six) hours. Patient taking differently: Take 3 mLs by nebulization every 6 (six) hours as needed (SOB/Wheezing). 06/03/23   Kassie Acquanetta Bradley, MD  melatonin 5 MG TABS Take 1 tablet (5 mg total) by mouth at bedtime as needed. 06/26/23   Ghimire, Donalda HERO, MD  mirtazapine (REMERON) 15 MG tablet Take 7.5 mg by mouth at bedtime. 11/30/20   [provider]  Multiple Vitamin (MULTIVITAMIN WITH MINERALS) TABS tablet Take 1 tablet by mouth daily.    [provider]  pantoprazole  (PROTONIX ) 40 MG tablet Take 40  mg by mouth daily.  02/15/16   [provider]  tamsulosin  (FLOMAX ) 0.4 MG CAPS capsule Take 1 capsule by mouth at bedtime. 08/01/22   [provider]  tiotropium (SPIRIVA  HANDIHALER) 18 MCG inhalation capsule Place 1 capsule (18 mcg total) into inhaler and inhale daily. 06/26/23 06/25/24  Ghimire, Donalda HERO, MD  vitamin B-12 (CYANOCOBALAMIN ) 1000 MCG tablet Take 1,000 mcg by mouth daily.    [provider]  XARELTO  10 MG TABS tablet Take 10 mg by mouth daily.    [provider]    Allergies: Atenolol, Gabapentin , Lotensin [benazepril hcl], and Wellbutrin  [bupropion]    Review of Systems  Updated Vital Signs BP (!) 161/82 (BP Location: Left Arm)   Pulse 74   Temp 97.8 F (36.6 C) (Oral)   Resp 16   Ht 5' 10 (1.778 m)   Wt 82.1 kg   SpO2 96%   BMI 25.97 kg/m   Physical Exam  (all labs ordered are listed, but only abnormal results are displayed) Labs Reviewed  CBC  BASIC METABOLIC PANEL WITH GFR    EKG: None  Radiology: No results found.  {Document cardiac monitor, telemetry assessment procedure when appropriate:32947} Procedures   Medications Ordered in the ED  fentaNYL (SUBLIMAZE) injection 50 mcg (has no administration in time range)      {Click here for ABCD2, HEART and other calculators REFRESH Note before signing:1}                              Medical Decision Making Amount and/or Complexity of Data Reviewed Labs: ordered. Radiology: ordered. ECG/medicine tests: ordered.  Risk Prescription drug management.   ***  {Document critical care time when appropriate  Document review of labs and clinical decision tools ie CHADS2VASC2, etc  Document your independent review of radiology images and any outside records  Document your discussion with family members, caretakers and with consultants  Document social determinants of health affecting pt's care  Document your decision making why or why not admission, treatments were needed:32947:::1}   Final diagnoses:  None    ED Discharge Orders     None

## 2024-01-02 ENCOUNTER — Other Ambulatory Visit: Payer: Self-pay

## 2024-01-02 ENCOUNTER — Other Ambulatory Visit (HOSPITAL_COMMUNITY): Payer: Self-pay

## 2024-01-02 ENCOUNTER — Encounter (HOSPITAL_COMMUNITY): Payer: Self-pay

## 2024-01-02 DIAGNOSIS — Z66 Do not resuscitate: Secondary | ICD-10-CM

## 2024-01-02 DIAGNOSIS — Z86711 Personal history of pulmonary embolism: Secondary | ICD-10-CM

## 2024-01-02 DIAGNOSIS — I10 Essential (primary) hypertension: Secondary | ICD-10-CM | POA: Diagnosis not present

## 2024-01-02 DIAGNOSIS — Z515 Encounter for palliative care: Secondary | ICD-10-CM

## 2024-01-02 DIAGNOSIS — J4489 Other specified chronic obstructive pulmonary disease: Secondary | ICD-10-CM

## 2024-01-02 DIAGNOSIS — S32591A Other specified fracture of right pubis, initial encounter for closed fracture: Secondary | ICD-10-CM | POA: Diagnosis not present

## 2024-01-02 DIAGNOSIS — J439 Emphysema, unspecified: Secondary | ICD-10-CM

## 2024-01-02 DIAGNOSIS — Z7901 Long term (current) use of anticoagulants: Secondary | ICD-10-CM

## 2024-01-02 MED ORDER — OXYCODONE HCL 5 MG PO TABS
2.5000 mg | ORAL_TABLET | ORAL | 0 refills | Status: AC | PRN
  Filled 2024-01-02: qty 30, 5d supply, fill #0

## 2024-01-02 MED ORDER — INFLUENZA VAC SPLIT HIGH-DOSE 0.5 ML IM SUSY: 0.5000 mL | PREFILLED_SYRINGE | INTRAMUSCULAR | Status: DC

## 2024-01-02 MED ORDER — IPRATROPIUM-ALBUTEROL 0.5-2.5 (3) MG/3ML IN SOLN: 3.0000 mL | Freq: Four times a day (QID) | RESPIRATORY_TRACT | Status: DC | PRN

## 2024-01-02 MED ORDER — GLYCOPYRROLATE 0.2 MG/ML IJ SOLN: 0.2000 mg | INTRAMUSCULAR | Status: DC | PRN

## 2024-01-02 MED ORDER — SODIUM CHLORIDE 0.9 % IV SOLN: INTRAVENOUS | Status: DC

## 2024-01-02 MED ORDER — MORPHINE SULFATE (PF) 2 MG/ML IV SOLN
2.0000 mg | INTRAVENOUS | Status: DC | PRN
  Administered 2024-01-02 (×3): 4 mg via INTRAVENOUS
  Filled 2024-01-02 (×3): qty 2

## 2024-01-02 MED ORDER — HYDROMORPHONE HCL 1 MG/ML IJ SOLN
0.5000 mg | Freq: Once | INTRAMUSCULAR | Status: AC
  Administered 2024-01-02: 0.5 mg via INTRAVENOUS
  Filled 2024-01-02: qty 1

## 2024-01-02 MED ORDER — PANTOPRAZOLE SODIUM 40 MG PO TBEC
40.0000 mg | DELAYED_RELEASE_TABLET | Freq: Every day | ORAL | Status: DC
  Administered 2024-01-02: 40 mg via ORAL
  Filled 2024-01-02: qty 1

## 2024-01-02 MED ORDER — RIVAROXABAN 10 MG PO TABS
10.0000 mg | ORAL_TABLET | Freq: Every day | ORAL | Status: DC
  Administered 2024-01-02: 10 mg via ORAL
  Filled 2024-01-02: qty 1

## 2024-01-02 MED ORDER — ACETAMINOPHEN 325 MG PO TABS
650.0000 mg | ORAL_TABLET | Freq: Four times a day (QID) | ORAL | Status: DC
  Administered 2024-01-02: 650 mg via ORAL
  Filled 2024-01-02: qty 2

## 2024-01-02 MED ORDER — ACETAMINOPHEN 650 MG RE SUPP: 650.0000 mg | Freq: Four times a day (QID) | RECTAL | Status: DC | PRN

## 2024-01-02 MED ORDER — TAMSULOSIN HCL 0.4 MG PO CAPS: 0.4000 mg | ORAL_CAPSULE | Freq: Every day | ORAL | Status: DC

## 2024-01-02 MED ORDER — ACETAMINOPHEN 325 MG PO TABS: 650.0000 mg | ORAL_TABLET | Freq: Four times a day (QID) | ORAL | Status: DC | PRN

## 2024-01-02 MED ORDER — ONDANSETRON HCL 4 MG/2ML IJ SOLN: 4.0000 mg | Freq: Four times a day (QID) | INTRAMUSCULAR | Status: DC | PRN

## 2024-01-02 MED ORDER — CITALOPRAM HYDROBROMIDE 20 MG PO TABS: 20.0000 mg | ORAL_TABLET | Freq: Every day | ORAL | Status: DC

## 2024-01-02 MED ORDER — HYDROMORPHONE HCL 1 MG/ML IJ SOLN
1.0000 mg | Freq: Once | INTRAMUSCULAR | Status: AC
  Administered 2024-01-02: 1 mg via INTRAVENOUS
  Filled 2024-01-02: qty 1

## 2024-01-02 MED ORDER — AMLODIPINE BESYLATE 5 MG PO TABS: 5.0000 mg | ORAL_TABLET | Freq: Every day | ORAL | Status: DC

## 2024-01-02 MED ORDER — ATORVASTATIN CALCIUM 10 MG PO TABS
20.0000 mg | ORAL_TABLET | Freq: Every day | ORAL | Status: DC
  Administered 2024-01-02: 20 mg via ORAL
  Filled 2024-01-02: qty 2

## 2024-01-02 MED ORDER — LORAZEPAM 2 MG/ML IJ SOLN: 2.0000 mg | INTRAMUSCULAR | Status: DC | PRN

## 2024-01-02 MED ORDER — ONDANSETRON HCL 4 MG PO TABS: 4.0000 mg | ORAL_TABLET | Freq: Four times a day (QID) | ORAL | Status: DC | PRN

## 2024-01-02 MED ORDER — MIRTAZAPINE 15 MG PO TABS: 7.5000 mg | ORAL_TABLET | Freq: Every day | ORAL | Status: DC

## 2024-01-02 MED ORDER — OXYCODONE HCL 5 MG PO TABS
2.5000 mg | ORAL_TABLET | ORAL | Status: DC | PRN
  Administered 2024-01-02: 5 mg via ORAL
  Filled 2024-01-02: qty 1

## 2024-01-02 MED ORDER — GLYCOPYRROLATE 1 MG PO TABS: 1.0000 mg | ORAL_TABLET | ORAL | Status: DC | PRN

## 2024-01-02 MED ORDER — POLYVINYL ALCOHOL 1.4 % OP SOLN: 1.0000 [drp] | Freq: Four times a day (QID) | OPHTHALMIC | Status: DC | PRN

## 2024-01-02 MED ORDER — LOSARTAN POTASSIUM 50 MG PO TABS: 100.0000 mg | ORAL_TABLET | Freq: Every day | ORAL | Status: DC

## 2024-01-02 NOTE — Assessment & Plan Note (Addendum)
 01/02/24 patient has a nondisplaced right inferior pubic ramus fracture on CT.  PT OT consulted.  He is weightbearing as tolerated.  Will see if he needs nursing home admission, but I his insurance company will pay for it as he is on hospice care.  Start scheduled Tylenol .  Low-dose oxycodone  as needed.  Patient has had frequent falls in the past as the right he had a periprosthetic femur fracture as evidenced by cerclage wires on his right femur.  Unclear when this was done as I cannot find any documentation in Care Everywhere or within his EMR.

## 2024-01-02 NOTE — Evaluation (Signed)
Physical Therapy Evaluation Patient Details Name: Steven Guerrero MRN: [id number] DOB: Jan 05, 1941 Today's Date: 01/02/2024  History of Present Illness  Pt is 83 yo male who presents on 10/10 after a fall at home. Imaging with R inferior pubic ramus fx. PMH includes: CVA, TBI 2003, PE, OSA not on CPAP, CKD3, GERD, COPD/ emphysema, chronic bronchitis, lung nodules, R THA  Clinical Impression  Pt in bed upon arrival of PT, agreeable to evaluation at this time. Prior to admission the pt was able to complete transfer use of RW and intermittent assist from wife for ADLs. The pt lives with both his wife and daughter who are available for assistance as needed. The pt required up to maxA for bed mobility due to pain, and modA of 2 to complete sit-stand transfers. He was unable to advance wt shift for stepping due to pain, and largely maintained RLE NWB when standing. Pt will need assist of 2 to manage mobility and transfers at home and could benefit from lift to reduce caregiver burden with OOB mobility. Wife present and reports she would rather pursue hoyer lift and HHPT through hospice provider. Pt has all needed DME and support through hospice to return home with family assist once medically stable.     If plan is discharge home, recommend the following: Two people to help with walking and/or transfers;Two people to help with bathing/dressing/bathroom;Assistance with cooking/housework;Assistance with feeding;Direct supervision/assist for medications management;Direct supervision/assist for financial management;Assist for transportation;Help with stairs or ramp for entrance   Can travel by private vehicle        Equipment Recommendations Deitra lift (wife prefers for this to come through hospice if needed)  Recommendations for Other Services       Functional Status Assessment Patient has had a recent decline in their functional status and demonstrates the ability to make significant improvements in  function in a reasonable and predictable amount of time.     Precautions / Restrictions Precautions Precautions: Fall Recall of Precautions/Restrictions: Intact Restrictions Weight Bearing Restrictions Per Provider Order: Yes RLE Weight Bearing Per Provider Order: Weight bearing as tolerated      Mobility  Bed Mobility Overal bed mobility: Needs Assistance Bed Mobility: Rolling, Supine to Sit, Sit to Supine Rolling: Max assist, Used rails   Supine to sit: Max assist, +2 for physical assistance Sit to supine: Max assist, +2 for physical assistance   General bed mobility comments: pt assisting with UE on rail to pivot and elevate trunk, assist to BLE to reduce pain with transfer    Transfers Overall transfer level: Needs assistance Equipment used: Rolling walker (2 wheels) Transfers: Sit to/from Stand Sit to Stand: Mod assist, +2 physical assistance, From elevated surface           General transfer comment: The pt maintains RLE NWB due to pain, dependent on significant assist on L side to power up to standing, steady with minA once in static standing position with BUE support, unable to advance wt shift and stepping for transfers    Ambulation/Gait               General Gait Details: pt unable to tolerate wt shift onto RLE for stepping     Balance Overall balance assessment: Needs assistance Sitting-balance support: Bilateral upper extremity supported Sitting balance-Leahy Scale: Poor Sitting balance - Comments: prefers BUE support, no overt LOB   Standing balance support: Bilateral upper extremity supported Standing balance-Leahy Scale: Poor Standing balance comment: dependent on BUE support and minA,  posterior lean with initial stand                             Pertinent Vitals/Pain Pain Assessment Pain Assessment: 0-10 Pain Score: 10-Worst pain ever Pain Location: in R hip with movement Pain Descriptors / Indicators: Discomfort, Grimacing,  Guarding, Aching Pain Intervention(s): Limited activity within patient's tolerance, Monitored during session, Repositioned    Home Living Family/patient expects to be discharged to:: Private residence Living Arrangements: Spouse/significant other Available Help at Discharge: Family;Available PRN/intermittently Type of Home: House Home Access: Stairs to enter   Entrance Stairs-Number of Steps: 1   Home Layout: One level Home Equipment: Agricultural consultant (2 wheels);Rollator (4 wheels);BSC/3in1;Shower seat;Grab bars - toilet;Grab bars - tub/shower;Wheelchair - Careers adviser (comment);Hand held shower head Additional Comments: lives with wife, on hospice care with RN 1x/week    Prior Function Prior Level of Function : Needs assist             Mobility Comments: ambulates with rollator, x1 recent fall in bathroom ADLs Comments: wife occasionally assists with LB dressing     Extremity/Trunk Assessment   Upper Extremity Assessment Upper Extremity Assessment: Defer to OT evaluation    Lower Extremity Assessment Lower Extremity Assessment: RLE deficits/detail;LLE deficits/detail RLE Deficits / Details: grossly 3+/5 to MMT, reports sensation intact. most limited at hip, good resistance at ankle and knee RLE: Unable to fully assess due to pain RLE Sensation: WNL RLE Coordination: WNL LLE Deficits / Details: grossly 4-/5 to MMT, reports sensation intact, limited by pain LLE: Unable to fully assess due to pain LLE Sensation: WNL LLE Coordination: WNL    Cervical / Trunk Assessment Cervical / Trunk Assessment: Kyphotic  Communication   Communication Communication: Impaired Factors Affecting Communication: Reduced clarity of speech    Cognition Arousal: Alert Behavior During Therapy: WFL for tasks assessed/performed   PT - Cognitive impairments: No apparent impairments                       PT - Cognition Comments: pt following commands, able to answer questions and  contribue to conversation appropriately. not formally assessed Following commands: Impaired Following commands impaired: Follows one step commands inconsistently     Cueing Cueing Techniques: Verbal cues     General Comments General comments (skin integrity, edema, etc.): VSS on R, wife present and arranging DME delivery    Exercises General Exercises - Lower Extremity Ankle Circles/Pumps: AROM, Both, 5 reps Heel Slides: AROM, Both, 5 reps   Assessment/Plan    PT Assessment Patient needs continued PT services  PT Problem List Decreased strength;Decreased activity tolerance;Decreased balance;Decreased mobility;Pain       PT Treatment Interventions DME instruction;Gait training;Stair training;Functional mobility training;Therapeutic activities;Therapeutic exercise;Balance training;Patient/family education    PT Goals (Current goals can be found in the Care Plan section)  Acute Rehab PT Goals Patient Stated Goal: to return home PT Goal Formulation: With patient/family Time For Goal Achievement: 01/15/2024 Potential to Achieve Goals: Good    Frequency Min 2X/week     Co-evaluation PT/OT/SLP Co-Evaluation/Treatment: Yes Reason for Co-Treatment: Complexity of the patient's impairments (multi-system involvement);Necessary to address cognition/behavior during functional activity;For patient/therapist safety;To address functional/ADL transfers PT goals addressed during session: Mobility/safety with mobility;Balance;Proper use of DME;Strengthening/ROM         AM-PAC PT 6 Clicks Mobility  Outcome Measure Help needed turning from your back to your side while in a flat bed without using bedrails?: A Lot  Help needed moving from lying on your back to sitting on the side of a flat bed without using bedrails?: A Lot Help needed moving to and from a bed to a chair (including a wheelchair)?: A Lot Help needed standing up from a chair using your arms (e.g., wheelchair or bedside chair)?:  A Lot Help needed to walk in hospital room?: Total Help needed climbing 3-5 steps with a railing? : Total 6 Click Score: 10    End of Session Equipment Utilized During Treatment: Gait belt Activity Tolerance: Patient tolerated treatment well;Patient limited by pain Patient left: in bed;with call bell/phone within reach;with family/visitor present Nurse Communication: Mobility status;Need for lift equipment PT Visit Diagnosis: Unsteadiness on feet (R26.81);Repeated falls (R29.6);Muscle weakness (generalized) (M62.81)    Time: 8986-8961 PT Time Calculation (min) (ACUTE ONLY): 25 min   Charges:   PT Evaluation $PT Eval Low Complexity: 1 Low   PT General Charges $$ ACUTE PT VISIT: 1 Visit         Izetta Call, PT, DPT   Acute Rehabilitation Department Office [phone] Secure Chat Communication Preferred  Izetta JULIANNA Call 01/02/2024, 11:02 AM

## 2024-01-02 NOTE — Care Management CC44 (Signed)
 Condition Code 44 Documentation Completed  Patient Details  Name: Steven Guerrero MRN: [id number] Date of Birth: 21-Sep-1940   Condition Code 44 given:  Yes Patient signature on Condition Code 44 notice:  Yes Documentation of 2 MD's agreement:  Yes Code 44 added to claim:  Yes    Carletha Spruce, RN 01/02/2024, 12:03 PM

## 2024-01-02 NOTE — Assessment & Plan Note (Addendum)
 01/02/24 continue Xarelto 

## 2024-01-02 NOTE — Progress Notes (Signed)
 TOC meds given to ambulance transport staff

## 2024-01-02 NOTE — Assessment & Plan Note (Addendum)
 Steven Guerrero

## 2024-01-02 NOTE — H&P (Signed)
 History and Physical    Steven Guerrero FMW:[id number] DOB: April 13, 1940 DOA: 01/01/2024  DOS: the patient was seen and examined on 01/01/2024  PCP: Clarice Nottingham, MD   Patient coming from: Home  I have personally briefly reviewed patient's old medical records in  Link  CC: fall at home HPI: 83 year old male with a history of prior stroke, hypertension, history of PE on Xarelto , COPD, on hospice care at home, who presents to the ER for a fall.  He states that he was trying to get to his closet to get some close.  He states that his walker was out of reach.  He walked to the closet without his walker.  He fell onto his buttocks.  He had pain.  Brought to the ER for evaluation.  CT of the right hip shows a nondisplaced right inferior pubic ramus fracture.  Patient admitted due to pain.  Significant Events: Admitted 01/01/2024 for right inferior pubic ramus fracture   Admission Labs: WBC 8.1, HgB 12.2, plt 184 Na 137, K 4.7, CO2 of 21, BUN 22, Scr 1.37, glu 106  Admission Imaging Studies: CT of the head: No acute intracranial abnormality noted. Chronic atrophic and ischemic changes are noted   CT of the cervical spine: Multilevel degenerative change without acute abnormality. Pelvic XR  Postoperative right total hip arthroplasty with cerclage wires and associated callus formation and ununited ossicles consistent with old fracture healing. No acute fracture is demonstrated although the inferior aspect of the femoral component is not included in additional views of the right femur are recommended for completion of the examination. 2. Irregularity in the right inferior pubic ramus is new since previous study and could indicate acute fracture. 3. Degenerative changes in the lower lumbar spine and left hip. CT right hip Right inferior pubic ramus fracture. Prior right hip replacement.  Significant Labs:   Significant Imaging Studies:   Antibiotic Therapy: Anti-infectives (From  admission, onward)    None       Procedures:   Consultants:     ED Course: CT right hip showed right inf pubic ramus fracture. CT head showed no acute bleeding.  Review of Systems:  Review of Systems  Constitutional: Negative.   HENT: Negative.    Eyes: Negative.   Respiratory: Negative.    Cardiovascular: Negative.   Musculoskeletal:  Positive for falls.       Right hip pain  Skin: Negative.   Neurological:        Chronically weak.   Endo/Heme/Allergies: Negative.   Psychiatric/Behavioral: Negative.    All other systems reviewed and are negative.   Past Medical History:  Diagnosis Date   Anxiety and depression    Back pain    Brain bleed (HCC)    2003   CVA (cerebral infarction)    2001   Hypercholesteremia    Hypertension    Peripheral neuropathy    Pulmonary embolism (HCC)    Sleep apnea    Stroke [facility])    2001   TBI (traumatic brain injury) (HCC)    TIA (transient ischemic attack) 04/02/2018    Past Surgical History:  Procedure Laterality Date   HERNIA REPAIR     JOINT REPLACEMENT Right    hip replacement   LEG SURGERY     Broken femur   TOTAL HIP ARTHROPLASTY     Right     reports that he quit smoking about 17 years ago. His smoking use included cigarettes. He started smoking about 64 years  ago. He has a 70.5 pack-year smoking history. He has never used smokeless tobacco. He reports current alcohol use. He reports that he does not use drugs.  Allergies  Allergen Reactions   Atenolol Anaphylaxis    Per AH   Gabapentin  Other (See Comments)    falls   Lotensin [Benazepril Hcl] Swelling    Caused tongue to swell   Wellbutrin [Bupropion] Other (See Comments)    Mental status changes per records from [facility]    Family History  Problem Relation Age of Onset   Heart attack Father        Mother - health hx unknown    Prior to Admission medications   Medication Sig Start Date End Date Taking? Authorizing Provider  acetaminophen  (TYLENOL ) 500  MG tablet Take 500-1,000 mg by mouth every 6 (six) hours as needed for moderate pain (pain score 4-6).   Yes [provider]  amLODipine  (NORVASC ) 5 MG tablet Take 5 mg by mouth daily. 07/21/22  Yes [provider]  atorvastatin  (LIPITOR) 20 MG tablet Take 20 mg by mouth daily.    Yes [provider]  augmented betamethasone dipropionate (DIPROLENE-AF) 0.05 % ointment Apply 1 Application topically 2 (two) times daily as needed (for psoriasis). 10/13/23  Yes [provider]  citalopram  (CELEXA ) 20 MG tablet Take 20 mg by mouth at bedtime.   Yes [provider]  Evolocumab (REPATHA) 140 MG/ML SOSY Inject 140 mg into the skin every 14 (fourteen) days.   Yes [provider]  Ferrous Gluconate-C-Folic Acid (IRON-C PO) Take 1 tablet by mouth every other day.   Yes [provider]  fish oil-omega-3 fatty acids 1000 MG capsule Take 1 g by mouth daily.   Yes [provider]  guaiFENesin  (ROBITUSSIN) 100 MG/5ML liquid Take 15 mLs by mouth every 6 (six) hours. Patient taking differently: Take 15 mLs by mouth daily as needed for to loosen phlegm. 06/26/23  Yes Ghimire, Donalda HERO, MD  ipratropium-albuterol  (DUONEB) 0.5-2.5 (3) MG/3ML SOLN Take 3 mLs by nebulization every 6 (six) hours. Patient taking differently: Take 3 mLs by nebulization every 6 (six) hours as needed (SOB/Wheezing). 06/03/23  Yes Kassie Acquanetta Bradley, MD  losartan  (COZAAR ) 100 MG tablet Take 100 mg by mouth daily. 09/12/23  Yes [provider]  mirtazapine (REMERON) 15 MG tablet Take 7.5 mg by mouth at bedtime. 11/30/20  Yes [provider]  Multiple Vitamin (MULTIVITAMIN WITH MINERALS) TABS tablet Take 1 tablet by mouth daily.   Yes [provider]  pantoprazole  (PROTONIX ) 40 MG tablet Take 40 mg by mouth daily.  02/15/16  Yes [provider]  tamsulosin  (FLOMAX ) 0.4 MG CAPS capsule Take 1 capsule by mouth at bedtime. 08/01/22  Yes [provider]  vitamin B-12 (CYANOCOBALAMIN ) 1000 MCG tablet Take 1,000 mcg by mouth daily.   Yes [provider]  XARELTO  10 MG TABS tablet Take 10 mg by mouth daily.   Yes [provider]  budesonide  (PULMICORT ) 0.5 MG/2ML nebulizer solution Take 2 mLs (0.5 mg total) by nebulization 2 (two) times daily. Patient not taking: Reported on 01/02/2024 07/17/23   Kassie Acquanetta Bradley, MD    Physical Exam: Vitals:   01/02/24 0407 01/02/24 0455 01/02/24 0531 01/02/24 0752  BP: 131/74 134/77 (!) 150/78 121/66  Pulse: 72 79 88 75  Resp:  16 18 17   Temp:  97.8 F (36.6 C) 98.2 F (36.8 C) 97.9 F (36.6 C)  TempSrc:  Oral  Oral  SpO2:  96% 96% 94% 95%  Weight:      Height:        Physical Exam Vitals and nursing note reviewed.  Constitutional:      Comments: Appears chronically ill  HENT:     Head: Normocephalic and atraumatic.  Cardiovascular:     Rate and Rhythm: Normal rate and regular rhythm.  Pulmonary:     Effort: Pulmonary effort is normal. No respiratory distress.     Breath sounds: Normal breath sounds. No wheezing.  Abdominal:     General: Bowel sounds are normal. There is no distension.     Palpations: Abdomen is soft.  Skin:    General: Skin is warm and dry.     Capillary Refill: Capillary refill takes less than 2 seconds.  Neurological:     Mental Status: He is alert.     Comments: Mildly dysarthric speech. Mild right facial droop.      Labs on Admission: I have personally reviewed following labs and imaging studies  CBC: Recent Labs  Lab 01/01/24 2223  WBC 8.1  HGB 12.2*  HCT 36.8*  MCV 95.8  PLT 184   Basic Metabolic Panel: Recent Labs  Lab 01/01/24 2223  NA 137  K 4.7  CL 107  CO2 21*  GLUCOSE 106*  BUN 22  CREATININE 1.37*  CALCIUM  9.1   GFR: Estimated Creatinine Clearance: 41.4 mL/min (A) (by C-G formula based on SCr of 1.37 mg/dL (H)). BNP (last 3 results) Recent Labs    06/15/23 0321 06/18/23 0454  BNP 150.5* 91.7    Radiological Exams on Admission: I have personally reviewed images CT Hip Right Wo Contrast Result Date: 01/01/2024 CLINICAL DATA:  Fall, right hip pain EXAM: CT OF THE RIGHT HIP WITHOUT CONTRAST TECHNIQUE: Multidetector CT imaging of the right hip was performed according to the standard protocol. Multiplanar CT image reconstructions were also generated. RADIATION DOSE REDUCTION: This exam was performed according to the departmental dose-optimization program which includes automated exposure control, adjustment of the mA and/or kV according to patient size and/or use of iterative reconstruction technique. COMPARISON:  Plain films today. FINDINGS: Bones/Joint/Cartilage Changes of prior right hip replacement. Cerclage wires noted. There is an acute appearing fracture in the right inferior pubic ramus. No additional fracture seen. No subluxation or dislocation. Ligaments Suboptimally assessed by CT. Muscles and Tendons Negative Soft tissues Negative IMPRESSION: Right inferior pubic ramus fracture. Prior right hip replacement. Electronically Signed   By: Franky Crease M.D.   On: 01/01/2024 23:30   DG Hip Unilat W or Wo Pelvis 2-3 Views Right Result Date: 01/01/2024 CLINICAL DATA:  Pain after fall onto the right hip. EXAM: DG HIP (WITH OR WITHOUT PELVIS) 2-3V RIGHT COMPARISON:  10/22/2023 FINDINGS: Postoperative changes with right total hip arthroplasty using long-stem femoral component and multiple cerclage wires. The inferior aspect of the component is not included within the field of view. If the patient has right hip pain, additional images of the femur are recommended to complete the evaluation. As visualized, there is no evidence of acute fracture or dislocation of the right hip. Diffuse bone deformity consistent with callus formation. Old ununited ossicles. Deformity of the right inferior pubic ramus appears new since prior study and could indicate acute fracture. Pelvis appears otherwise intact. SI  joints and symphysis pubis are not displaced. Degenerative changes in the lower lumbar spine and left hip. Vascular calcifications. IMPRESSION: 1. Postoperative right total hip arthroplasty with cerclage wires and associated callus formation and ununited ossicles consistent  with old fracture healing. No acute fracture is demonstrated although the inferior aspect of the femoral component is not included in additional views of the right femur are recommended for completion of the examination. 2. Irregularity in the right inferior pubic ramus is new since previous study and could indicate acute fracture. 3. Degenerative changes in the lower lumbar spine and left hip. Electronically Signed   By: Elsie Gravely M.D.   On: 01/01/2024 22:59   CT Head Wo Contrast Result Date: 01/01/2024 CLINICAL DATA:  Recent fall with headaches and neck pain, initial encounter EXAM: CT HEAD WITHOUT CONTRAST CT CERVICAL SPINE WITHOUT CONTRAST TECHNIQUE: Multidetector CT imaging of the head and cervical spine was performed following the standard protocol without intravenous contrast. Multiplanar CT image reconstructions of the cervical spine were also generated. RADIATION DOSE REDUCTION: This exam was performed according to the departmental dose-optimization program which includes automated exposure control, adjustment of the mA and/or kV according to patient size and/or use of iterative reconstruction technique. COMPARISON:  04/30/2023 FINDINGS: CT HEAD FINDINGS Brain: No evidence of acute infarction, hemorrhage, hydrocephalus, extra-axial collection or mass lesion/mass effect. Chronic atrophic and ischemic changes are again seen and stable. Vascular: No hyperdense vessel or unexpected calcification. Skull: Normal. Negative for fracture or focal lesion. Sinuses/Orbits: No acute finding. Other: None. CT CERVICAL SPINE FINDINGS Alignment: Within normal limits. Skull base and vertebrae: 7 cervical segments are well visualized. Vertebral  body height is well maintained. Mild osteophytic changes and facet hypertrophic changes are noted. No acute fracture or acute facet abnormality is noted. The odontoid is within normal limits. Soft tissues and spinal canal: Surrounding soft tissue structures are within normal limits. Upper chest: Visualized lung apices show no acute abnormality. Other: None IMPRESSION: CT of the head: No acute intracranial abnormality noted. Chronic atrophic and ischemic changes are noted. CT of the cervical spine: Multilevel degenerative change without acute abnormality. Electronically Signed   By: Oneil Devonshire M.D.   On: 01/01/2024 22:59   CT Cervical Spine Wo Contrast Result Date: 01/01/2024 CLINICAL DATA:  Recent fall with headaches and neck pain, initial encounter EXAM: CT HEAD WITHOUT CONTRAST CT CERVICAL SPINE WITHOUT CONTRAST TECHNIQUE: Multidetector CT imaging of the head and cervical spine was performed following the standard protocol without intravenous contrast. Multiplanar CT image reconstructions of the cervical spine were also generated. RADIATION DOSE REDUCTION: This exam was performed according to the departmental dose-optimization program which includes automated exposure control, adjustment of the mA and/or kV according to patient size and/or use of iterative reconstruction technique. COMPARISON:  04/30/2023 FINDINGS: CT HEAD FINDINGS Brain: No evidence of acute infarction, hemorrhage, hydrocephalus, extra-axial collection or mass lesion/mass effect. Chronic atrophic and ischemic changes are again seen and stable. Vascular: No hyperdense vessel or unexpected calcification. Skull: Normal. Negative for fracture or focal lesion. Sinuses/Orbits: No acute finding. Other: None. CT CERVICAL SPINE FINDINGS Alignment: Within normal limits. Skull base and vertebrae: 7 cervical segments are well visualized. Vertebral body height is well maintained. Mild osteophytic changes and facet hypertrophic changes are noted. No  acute fracture or acute facet abnormality is noted. The odontoid is within normal limits. Soft tissues and spinal canal: Surrounding soft tissue structures are within normal limits. Upper chest: Visualized lung apices show no acute abnormality. Other: None IMPRESSION: CT of the head: No acute intracranial abnormality noted. Chronic atrophic and ischemic changes are noted. CT of the cervical spine: Multilevel degenerative change without acute abnormality. Electronically Signed   By: Oneil Devonshire M.D.   On:  01/01/2024 22:59   EKG: My personal interpretation of EKG shows: NSR     Assessment/Plan Principal Problem:   Closed fracture of right inferior pubic ramus (HCC) Active Problems:   Long term current use of anticoagulant   Essential hypertension   History of pulmonary embolus (PE)   COPD with chronic bronchitis and emphysema (HCC)   Hospice care patient   DNR (do not resuscitate)/DNI(Do Not Intubate)    Assessment and Plan: * Closed fracture of right inferior pubic ramus (HCC) 01/02/24 patient has a nondisplaced right inferior pubic ramus fracture on CT.  PT OT consulted.  He is weightbearing as tolerated.  Will see if he needs nursing home admission, but I his insurance company will pay for it as he is on hospice care.  Start scheduled Tylenol .  Low-dose oxycodone  as needed.  Patient has had frequent falls in the past as the right he had a periprosthetic femur fracture as evidenced by cerclage wires on his right femur.  Unclear when this was done as I cannot find any documentation in Care Everywhere or within his EMR.   DNR (do not resuscitate)/DNI(Do Not Intubate)        Hospice care patient 01/02/24 have messaged hospice care to see what services are offered to him at home.  If he cannot walk, I am not sure his insurance company will pay for skilled nursing given that he is on hospice care.   COPD with chronic bronchitis and emphysema (HCC) 01/02/24 Stable not exacerbated.   Continue as needed nebs.   History of pulmonary embolus (PE) 01/02/24 continue Xarelto    Essential hypertension 01/02/24 Stable.  Continue Norvasc  and Cozaar .   Long term current use of anticoagulant 01/02/24 no brain bleeding noted on CT.  Continue Xarelto .   DVT prophylaxis: Xarelto  Code Status: DNR/DNI(Do NOT Intubate) Family Communication: no family at bedside  Disposition Plan: unknown  Consults called: have asked hospice to come and talk to patient about his disposition  Admission status: Observation, Med-Surg   Camellia Door, DO [facility] 01/02/2024, 9:06 AM

## 2024-01-02 NOTE — Progress Notes (Signed)
 Steven Guerrero [id number] Liaison Note   [name] is a current patient with [facility] with a terminal diagnosis of  Cerebrovascular Disease. He presented to the ED from home after suffering fall. CT of the right hip shows a nondisplaced right inferior pubic ramus fracture. Patient requiring pain management.   Patient will be discharged back home today with wife. Hospital Bed and Overbed table ordered and delivered today. Provider ordering pain meds for home.  Should patient need ambulance transfer at discharge- please use [facility]) as they contract this service for our active hospice patients.    Thank you for the opportunity to participate in this patient's care, please don't hesitate to call for any hospice related questions or concerns.   Nat Babe, BSN, [employer]  [phone]

## 2024-01-02 NOTE — Assessment & Plan Note (Addendum)
 01/02/24 Stable.  Continue Norvasc  and Cozaar .

## 2024-01-02 NOTE — Plan of Care (Signed)
  Problem: Education: Goal: Knowledge of General Education information will improve Description: Including pain rating scale, medication(s)/side effects and non-pharmacologic comfort measures Outcome: Adequate for Discharge   Problem: Health Behavior/Discharge Planning: Goal: Ability to manage health-related needs will improve Outcome: Adequate for Discharge   Problem: Clinical Measurements: Goal: Ability to maintain clinical measurements within normal limits will improve Outcome: Adequate for Discharge Goal: Diagnostic test results will improve Outcome: Adequate for Discharge Goal: Respiratory complications will improve Outcome: Adequate for Discharge   Problem: Activity: Goal: Risk for activity intolerance will decrease Outcome: Adequate for Discharge   Problem: Nutrition: Goal: Adequate nutrition will be maintained Outcome: Adequate for Discharge   Problem: Elimination: Goal: Will not experience complications related to urinary retention Outcome: Adequate for Discharge   Problem: Pain Managment: Goal: General experience of comfort will improve and/or be controlled Outcome: Adequate for Discharge   Problem: Safety: Goal: Ability to remain free from injury will improve Outcome: Adequate for Discharge   Problem: Skin Integrity: Goal: Risk for impaired skin integrity will decrease Outcome: Adequate for Discharge

## 2024-01-02 NOTE — Assessment & Plan Note (Addendum)
 01/02/24 Stable not exacerbated.  Continue as needed nebs.

## 2024-01-02 NOTE — Plan of Care (Signed)
  Problem: Education: Goal: Knowledge of General Education information will improve Description: Including pain rating scale, medication(s)/side effects and non-pharmacologic comfort measures 01/02/2024 1312 by Joshua Zorita CROME, RN Outcome: Adequate for Discharge 01/02/2024 1311 by Joshua Zorita CROME, RN Outcome: Adequate for Discharge   Problem: Health Behavior/Discharge Planning: Goal: Ability to manage health-related needs will improve 01/02/2024 1312 by Joshua Zorita CROME, RN Outcome: Adequate for Discharge 01/02/2024 1311 by Joshua Zorita CROME, RN Outcome: Adequate for Discharge   Problem: Clinical Measurements: Goal: Ability to maintain clinical measurements within normal limits will improve 01/02/2024 1312 by Joshua Zorita CROME, RN Outcome: Adequate for Discharge 01/02/2024 1311 by Joshua Zorita CROME, RN Outcome: Adequate for Discharge Goal: Diagnostic test results will improve 01/02/2024 1312 by Joshua Zorita CROME, RN Outcome: Adequate for Discharge 01/02/2024 1311 by Joshua Zorita CROME, RN Outcome: Adequate for Discharge Goal: Respiratory complications will improve 01/02/2024 1312 by Joshua Zorita CROME, RN Outcome: Adequate for Discharge 01/02/2024 1311 by Joshua Zorita CROME, RN Outcome: Adequate for Discharge   Problem: Activity: Goal: Risk for activity intolerance will decrease 01/02/2024 1312 by Joshua Zorita CROME, RN Outcome: Adequate for Discharge 01/02/2024 1311 by Joshua Zorita CROME, RN Outcome: Adequate for Discharge   Problem: Nutrition: Goal: Adequate nutrition will be maintained 01/02/2024 1312 by Joshua Zorita CROME, RN Outcome: Adequate for Discharge 01/02/2024 1311 by Joshua Zorita CROME, RN Outcome: Adequate for Discharge   Problem: Elimination: Goal: Will not experience complications related to urinary retention 01/02/2024 1312 by Joshua Zorita CROME, RN Outcome: Adequate for Discharge 01/02/2024 1311 by Joshua Zorita CROME, RN Outcome: Adequate for Discharge   Problem: Pain Managment: Goal: General  experience of comfort will improve and/or be controlled 01/02/2024 1312 by Joshua Zorita CROME, RN Outcome: Adequate for Discharge 01/02/2024 1311 by Joshua Zorita CROME, RN Outcome: Adequate for Discharge   Problem: Safety: Goal: Ability to remain free from injury will improve 01/02/2024 1312 by Joshua Zorita CROME, RN Outcome: Adequate for Discharge 01/02/2024 1311 by Joshua Zorita CROME, RN Outcome: Adequate for Discharge   Problem: Skin Integrity: Goal: Risk for impaired skin integrity will decrease 01/02/2024 1312 by Joshua Zorita CROME, RN Outcome: Adequate for Discharge 01/02/2024 1311 by Joshua Zorita CROME, RN Outcome: Adequate for Discharge

## 2024-01-02 NOTE — Discharge Summary (Signed)
 [facility] Physician Discharge Summary   Patient name: Steven Guerrero  Admit date:     01/01/2024  Discharge date: 01/02/2024  Attending Physician: DENA CHARLESTON [[id number]]  Discharge Physician: Camellia Door   PCP: Clarice Nottingham, MD  Admitted From: Home  Disposition:  Home with hospice  Recommendations for Outpatient Follow-up:  Follow up with PCP in 1-2 weeks Follow up with home hospice  Home Health:No Equipment/Devices: hospital bed    Discharge Condition:Stable CODE STATUS:DNR/DNI Diet recommendation: Heart Healthy Fluid Restriction: None  Hospital Summary: CC: fall at home HPI: 83 year old male with a history of prior stroke, hypertension, history of PE on Xarelto , COPD, on hospice care at home, who presents to the ER for a fall.  He states that he was trying to get to his closet to get some close.  He states that his walker was out of reach.  He walked to the closet without his walker.  He fell onto his buttocks.  He had pain.  Brought to the ER for evaluation.  CT of the right hip shows a nondisplaced right inferior pubic ramus fracture.  Patient admitted due to pain.  Significant Events: Admitted 01/01/2024 for right inferior pubic ramus fracture   Admission Labs: WBC 8.1, HgB 12.2, plt 184 Na 137, K 4.7, CO2 of 21, BUN 22, Scr 1.37, glu 106  Admission Imaging Studies: CT of the head: No acute intracranial abnormality noted. Chronic atrophic and ischemic changes are noted   CT of the cervical spine: Multilevel degenerative change without acute abnormality. Pelvic XR  Postoperative right total hip arthroplasty with cerclage wires and associated callus formation and ununited ossicles consistent with old fracture healing. No acute fracture is demonstrated although the inferior aspect of the femoral component is not included in additional views of the right femur are recommended for completion of the examination. 2. Irregularity in the right inferior pubic ramus is  new since previous study and could indicate acute fracture. 3. Degenerative changes in the lower lumbar spine and left hip. CT right hip Right inferior pubic ramus fracture. Prior right hip replacement.  Significant Labs:   Significant Imaging Studies:   Antibiotic Therapy: Anti-infectives (From admission, onward)    None       Procedures:   Consultants:    Hospital Course by Problem: * Closed fracture of right inferior pubic ramus (HCC) 01/02/24 patient has a nondisplaced right inferior pubic ramus fracture on CT.  PT OT consulted.  He is weightbearing as tolerated.  Will see if he needs nursing home admission, but I his insurance company will pay for it as he is on hospice care.  Start scheduled Tylenol .  Low-dose oxycodone  as needed.  Patient has had frequent falls in the past as the right he had a periprosthetic femur fracture as evidenced by cerclage wires on his right femur.  Unclear when this was done as I cannot find any documentation in Care Everywhere or within his EMR.   Hospice care patient 01/02/24 have messaged hospice care to see what services are offered to him at home.  If he cannot walk, I am not sure his insurance company will pay for skilled nursing given that he is on hospice care.  *updated. Communicated with [facility] hospice. Pt cannot receive rehab services since he is on hospice care. Hospice rep discussed with pt's wife. Wife would like for pt to return home. DC to home with small supply of oxycodone . TOC/CM have arranged for pt go to home by ambulance. Hospice  will f/u with pt and wife. Will DC to home today.  DNR (do not resuscitate)/DNI(Do Not Intubate)        COPD with chronic bronchitis and emphysema (HCC) 01/02/24 Stable not exacerbated.  Continue as needed nebs.   History of pulmonary embolus (PE) 01/02/24 continue Xarelto    Essential hypertension 01/02/24 Stable.  Continue Norvasc  and Cozaar .   Long term current use of  anticoagulant 01/02/24 no brain bleeding noted on CT.  Continue Xarelto .   Discharge Diagnoses:  Principal Problem:   Closed fracture of right inferior pubic ramus (HCC) Active Problems:   Hospice care patient   Long term current use of anticoagulant   Essential hypertension   History of pulmonary embolus (PE)   COPD with chronic bronchitis and emphysema (HCC)   DNR (do not resuscitate)/DNI(Do Not Intubate)   Discharge Instructions  Discharge Instructions     Call MD for:  difficulty breathing, headache or visual disturbances   Complete by: As directed    Call MD for:  extreme fatigue   Complete by: As directed    Call MD for:  hives   Complete by: As directed    Call MD for:  persistant dizziness or light-headedness   Complete by: As directed    Call MD for:  persistant nausea and vomiting   Complete by: As directed    Call MD for:  redness, tenderness, or signs of infection (pain, swelling, redness, odor or green/yellow discharge around incision site)   Complete by: As directed    Call MD for:  severe uncontrolled pain   Complete by: As directed    Call MD for:  temperature >100.4   Complete by: As directed    Diet - low sodium heart healthy   Complete by: As directed    Discharge instructions   Complete by: As directed    1. Follow up with home hospice   Increase activity slowly   Complete by: As directed    Weight bearing as tolerated   Complete by: As directed    Laterality: right   Extremity: Lower      Allergies as of 01/02/2024       Reactions   Atenolol Anaphylaxis   Per AH   Gabapentin  Other (See Comments)   falls   Lotensin [benazepril Hcl] Swelling   Caused tongue to swell   Wellbutrin [bupropion] Other (See Comments)   Mental status changes per records from [facility]        Medication List     STOP taking these medications    budesonide  0.5 MG/2ML nebulizer solution Commonly known as: Pulmicort        TAKE these medications     acetaminophen  500 MG tablet Commonly known as: TYLENOL  Take 500-1,000 mg by mouth every 6 (six) hours as needed for moderate pain (pain score 4-6).   amLODipine  5 MG tablet Commonly known as: NORVASC  Take 5 mg by mouth daily.   atorvastatin  20 MG tablet Commonly known as: LIPITOR Take 20 mg by mouth daily.   augmented betamethasone dipropionate 0.05 % ointment Commonly known as: DIPROLENE-AF Apply 1 Application topically 2 (two) times daily as needed (for psoriasis).   citalopram  20 MG tablet Commonly known as: CELEXA  Take 20 mg by mouth at bedtime.   cyanocobalamin  1000 MCG tablet Commonly known as: VITAMIN B12 Take 1,000 mcg by mouth daily.   fish oil-omega-3 fatty acids 1000 MG capsule Take 1 g by mouth daily.   guaiFENesin  100 MG/5ML liquid Commonly known  as: ROBITUSSIN Take 15 mLs by mouth every 6 (six) hours. What changed:  when to take this reasons to take this   ipratropium-albuterol  0.5-2.5 (3) MG/3ML Soln Commonly known as: DUONEB Take 3 mLs by nebulization every 6 (six) hours. What changed:  when to take this reasons to take this   IRON-C PO Take 1 tablet by mouth every other day.   losartan  100 MG tablet Commonly known as: COZAAR  Take 100 mg by mouth daily.   mirtazapine 15 MG tablet Commonly known as: REMERON Take 7.5 mg by mouth at bedtime.   multivitamin with minerals Tabs tablet Take 1 tablet by mouth daily.   oxyCODONE  5 MG immediate release tablet Commonly known as: Oxy IR/ROXICODONE  Take 0.5-1 tablets (2.5-5 mg total) by mouth every 4 (four) hours as needed for up to 7 days for moderate pain (pain score 4-6) or severe pain (pain score 7-10).   pantoprazole  40 MG tablet Commonly known as: PROTONIX  Take 40 mg by mouth daily.   Repatha 140 MG/ML Sosy Generic drug: Evolocumab Inject 140 mg into the skin every 14 (fourteen) days.   tamsulosin  0.4 MG Caps capsule Commonly known as: FLOMAX  Take 1 capsule by mouth at bedtime.    Xarelto  10 MG Tabs tablet Generic drug: rivaroxaban  Take 10 mg by mouth daily.               Discharge Care Instructions  (From admission, onward)           Start     Ordered   01/02/24 0000  Weight bearing as tolerated       Question Answer Comment  Laterality right   Extremity Lower      01/02/24 0937            Allergies  Allergen Reactions   Atenolol Anaphylaxis    Per AH   Gabapentin  Other (See Comments)    falls   Lotensin [Benazepril Hcl] Swelling    Caused tongue to swell   Wellbutrin [Bupropion] Other (See Comments)    Mental status changes per records from [facility]    Discharge Exam: Vitals:   01/02/24 0531 01/02/24 0752  BP: (!) 150/78 121/66  Pulse: 88 75  Resp: 18 17  Temp: 98.2 F (36.8 C) 97.9 F (36.6 C)  SpO2: 94% 95%    Physical Exam Vitals and nursing note reviewed.  Constitutional:      Comments: Elderly, male. No distress  HENT:     Head: Normocephalic and atraumatic.  Cardiovascular:     Rate and Rhythm: Normal rate and regular rhythm.  Pulmonary:     Effort: Pulmonary effort is normal.     Breath sounds: Normal breath sounds.  Abdominal:     General: Bowel sounds are normal.     Palpations: Abdomen is soft.  Musculoskeletal:     Right lower leg: No edema.     Left lower leg: No edema.  Skin:    General: Skin is warm and dry.     Capillary Refill: Capillary refill takes less than 2 seconds.  Neurological:     Mental Status: He is alert.     Comments: Mild right facial droop. Mild dysarthria     The results of significant diagnostics from this hospitalization (including imaging, microbiology, ancillary and laboratory) are listed below for reference.    Labs: BNP (last 3 results) Recent Labs    06/15/23 0321 06/18/23 0454  BNP 150.5* 91.7   Basic Metabolic Panel: Recent Labs  Lab 01/01/24 2223  NA 137  K 4.7  CL 107  CO2 21*  GLUCOSE 106*  BUN 22  CREATININE 1.37*  CALCIUM  9.1   CBC: Recent Labs   Lab 01/01/24 2223  WBC 8.1  HGB 12.2*  HCT 36.8*  MCV 95.8  PLT 184   Sepsis Labs Recent Labs  Lab 01/01/24 2223  WBC 8.1    Procedures/Studies: CT Hip Right Wo Contrast Result Date: 01/01/2024 CLINICAL DATA:  Fall, right hip pain EXAM: CT OF THE RIGHT HIP WITHOUT CONTRAST TECHNIQUE: Multidetector CT imaging of the right hip was performed according to the standard protocol. Multiplanar CT image reconstructions were also generated. RADIATION DOSE REDUCTION: This exam was performed according to the departmental dose-optimization program which includes automated exposure control, adjustment of the mA and/or kV according to patient size and/or use of iterative reconstruction technique. COMPARISON:  Plain films today. FINDINGS: Bones/Joint/Cartilage Changes of prior right hip replacement. Cerclage wires noted. There is an acute appearing fracture in the right inferior pubic ramus. No additional fracture seen. No subluxation or dislocation. Ligaments Suboptimally assessed by CT. Muscles and Tendons Negative Soft tissues Negative IMPRESSION: Right inferior pubic ramus fracture. Prior right hip replacement. Electronically Signed   By: Franky Crease M.D.   On: 01/01/2024 23:30   DG Hip Unilat W or Wo Pelvis 2-3 Views Right Result Date: 01/01/2024 CLINICAL DATA:  Pain after fall onto the right hip. EXAM: DG HIP (WITH OR WITHOUT PELVIS) 2-3V RIGHT COMPARISON:  10/22/2023 FINDINGS: Postoperative changes with right total hip arthroplasty using long-stem femoral component and multiple cerclage wires. The inferior aspect of the component is not included within the field of view. If the patient has right hip pain, additional images of the femur are recommended to complete the evaluation. As visualized, there is no evidence of acute fracture or dislocation of the right hip. Diffuse bone deformity consistent with callus formation. Old ununited ossicles. Deformity of the right inferior pubic ramus appears new  since prior study and could indicate acute fracture. Pelvis appears otherwise intact. SI joints and symphysis pubis are not displaced. Degenerative changes in the lower lumbar spine and left hip. Vascular calcifications. IMPRESSION: 1. Postoperative right total hip arthroplasty with cerclage wires and associated callus formation and ununited ossicles consistent with old fracture healing. No acute fracture is demonstrated although the inferior aspect of the femoral component is not included in additional views of the right femur are recommended for completion of the examination. 2. Irregularity in the right inferior pubic ramus is new since previous study and could indicate acute fracture. 3. Degenerative changes in the lower lumbar spine and left hip. Electronically Signed   By: Elsie Gravely M.D.   On: 01/01/2024 22:59   CT Head Wo Contrast Result Date: 01/01/2024 CLINICAL DATA:  Recent fall with headaches and neck pain, initial encounter EXAM: CT HEAD WITHOUT CONTRAST CT CERVICAL SPINE WITHOUT CONTRAST TECHNIQUE: Multidetector CT imaging of the head and cervical spine was performed following the standard protocol without intravenous contrast. Multiplanar CT image reconstructions of the cervical spine were also generated. RADIATION DOSE REDUCTION: This exam was performed according to the departmental dose-optimization program which includes automated exposure control, adjustment of the mA and/or kV according to patient size and/or use of iterative reconstruction technique. COMPARISON:  04/30/2023 FINDINGS: CT HEAD FINDINGS Brain: No evidence of acute infarction, hemorrhage, hydrocephalus, extra-axial collection or mass lesion/mass effect. Chronic atrophic and ischemic changes are again seen and stable. Vascular: No hyperdense vessel or unexpected calcification.  Skull: Normal. Negative for fracture or focal lesion. Sinuses/Orbits: No acute finding. Other: None. CT CERVICAL SPINE FINDINGS Alignment: Within  normal limits. Skull base and vertebrae: 7 cervical segments are well visualized. Vertebral body height is well maintained. Mild osteophytic changes and facet hypertrophic changes are noted. No acute fracture or acute facet abnormality is noted. The odontoid is within normal limits. Soft tissues and spinal canal: Surrounding soft tissue structures are within normal limits. Upper chest: Visualized lung apices show no acute abnormality. Other: None IMPRESSION: CT of the head: No acute intracranial abnormality noted. Chronic atrophic and ischemic changes are noted. CT of the cervical spine: Multilevel degenerative change without acute abnormality. Electronically Signed   By: Oneil Devonshire M.D.   On: 01/01/2024 22:59   CT Cervical Spine Wo Contrast Result Date: 01/01/2024 CLINICAL DATA:  Recent fall with headaches and neck pain, initial encounter EXAM: CT HEAD WITHOUT CONTRAST CT CERVICAL SPINE WITHOUT CONTRAST TECHNIQUE: Multidetector CT imaging of the head and cervical spine was performed following the standard protocol without intravenous contrast. Multiplanar CT image reconstructions of the cervical spine were also generated. RADIATION DOSE REDUCTION: This exam was performed according to the departmental dose-optimization program which includes automated exposure control, adjustment of the mA and/or kV according to patient size and/or use of iterative reconstruction technique. COMPARISON:  04/30/2023 FINDINGS: CT HEAD FINDINGS Brain: No evidence of acute infarction, hemorrhage, hydrocephalus, extra-axial collection or mass lesion/mass effect. Chronic atrophic and ischemic changes are again seen and stable. Vascular: No hyperdense vessel or unexpected calcification. Skull: Normal. Negative for fracture or focal lesion. Sinuses/Orbits: No acute finding. Other: None. CT CERVICAL SPINE FINDINGS Alignment: Within normal limits. Skull base and vertebrae: 7 cervical segments are well visualized. Vertebral body height is  well maintained. Mild osteophytic changes and facet hypertrophic changes are noted. No acute fracture or acute facet abnormality is noted. The odontoid is within normal limits. Soft tissues and spinal canal: Surrounding soft tissue structures are within normal limits. Upper chest: Visualized lung apices show no acute abnormality. Other: None IMPRESSION: CT of the head: No acute intracranial abnormality noted. Chronic atrophic and ischemic changes are noted. CT of the cervical spine: Multilevel degenerative change without acute abnormality. Electronically Signed   By: Oneil Devonshire M.D.   On: 01/01/2024 22:59    Time coordinating discharge: 60 mins  SIGNED:  Camellia Door, DO [facility] 01/02/24, 10:44 AM

## 2024-01-02 NOTE — Assessment & Plan Note (Addendum)
 01/02/24 no brain bleeding noted on CT.  Continue Xarelto .

## 2024-01-02 NOTE — TOC CM/SW Note (Addendum)
 Pt has been DC. Met with pt and wife. Wife reports the hospital bed has been delivered. Pt requires ambulance for transportation. Confirmed home address, [address], [place]. Arranged transportation with [facility]. Dr. Laurence, Zorita, RN, and Nat, hospice RN, notified.  PT is recommending a lift. Notified Nat, RN, of DME need.

## 2024-01-02 NOTE — Care Management Obs Status (Signed)
 MEDICARE OBSERVATION STATUS NOTIFICATION   Patient Details  Name: Steven Guerrero MRN: [id number] Date of Birth: 06-24-1940   Medicare Observation Status Notification Given:  Yes    Carletha Spruce, RN 01/02/2024, 12:03 PM

## 2024-01-02 NOTE — Hospital Course (Addendum)
 CC: fall at home HPI: 83 year old male with a history of prior stroke, hypertension, history of PE on Xarelto , COPD, on hospice care at home, who presents to the ER for a fall.  He states that he was trying to get to his closet to get some close.  He states that his walker was out of reach.  He walked to the closet without his walker.  He fell onto his buttocks.  He had pain.  Brought to the ER for evaluation.  CT of the right hip shows a nondisplaced right inferior pubic ramus fracture.  Patient admitted due to pain.  Significant Events: Admitted 01/01/2024 for right inferior pubic ramus fracture   Admission Labs: WBC 8.1, HgB 12.2, plt 184 Na 137, K 4.7, CO2 of 21, BUN 22, Scr 1.37, glu 106  Admission Imaging Studies: CT of the head: No acute intracranial abnormality noted. Chronic atrophic and ischemic changes are noted   CT of the cervical spine: Multilevel degenerative change without acute abnormality. Pelvic XR  Postoperative right total hip arthroplasty with cerclage wires and associated callus formation and ununited ossicles consistent with old fracture healing. No acute fracture is demonstrated although the inferior aspect of the femoral component is not included in additional views of the right femur are recommended for completion of the examination. 2. Irregularity in the right inferior pubic ramus is new since previous study and could indicate acute fracture. 3. Degenerative changes in the lower lumbar spine and left hip. CT right hip Right inferior pubic ramus fracture. Prior right hip replacement.  Significant Labs:   Significant Imaging Studies:   Antibiotic Therapy: Anti-infectives (From admission, onward)    None       Procedures:   Consultants:

## 2024-01-02 NOTE — Assessment & Plan Note (Addendum)
 01/02/24 have messaged hospice care to see what services are offered to him at home.  If he cannot walk, I am not sure his insurance company will pay for skilled nursing given that he is on hospice care.  *updated. Communicated with [facility] hospice. Pt cannot receive rehab services since he is on hospice care. Hospice rep discussed with pt's wife. Wife would like for pt to return home. DC to home with small supply of oxycodone . TOC/CM have arranged for pt go to home by ambulance. Hospice will f/u with pt and wife. Will DC to home today.

## 2024-01-02 NOTE — Evaluation (Signed)
Occupational Therapy Evaluation Patient Details Name: Steven Guerrero MRN: [id number] DOB: 03/13/1941 Today's Date: 01/02/2024   History of Present Illness   Pt is 83 yo male who presents on 10/10 after a fall at home. Imaging with R inferior pubic ramus fx. PMH includes: CVA, TBI 2003, PE, OSA not on CPAP, CKD3, GERD, COPD/ emphysema, chronic bronchitis, lung nodules, R THA     Clinical Impressions Prior to this admission, patient living at home under hospice care, with wife and daughter able to provide assist. Patient was ambulatory with a rollator and able to complete ADLs and IADLs with assist from family. Currently, patient is max A of 2 for bed mobility and transfers, and need for increased assist for lower body management. OT is recommending return home with hospice care, with hospital bed already delivered per wife report. Patient has no further acute OT needs, and will sign off at this time. Please re-consult if further acute needs arise.      If plan is discharge home, recommend the following:   Two people to help with walking and/or transfers;A lot of help with bathing/dressing/bathroom;Assistance with cooking/housework;Assist for transportation;Help with stairs or ramp for entrance     Functional Status Assessment   Patient has had a recent decline in their functional status and demonstrates the ability to make significant improvements in function in a reasonable and predictable amount of time.     Equipment Recommendations   None recommended by OT     Recommendations for Other Services         Precautions/Restrictions   Precautions Precautions: Fall Recall of Precautions/Restrictions: Intact Restrictions Weight Bearing Restrictions Per Provider Order: Yes RLE Weight Bearing Per Provider Order: Weight bearing as tolerated     Mobility Bed Mobility Overal bed mobility: Needs Assistance Bed Mobility: Rolling, Supine to Sit, Sit to Supine Rolling: Max  assist, Used rails   Supine to sit: Max assist, +2 for physical assistance Sit to supine: Max assist, +2 for physical assistance   General bed mobility comments: pt assisting with UE on rail to pivot and elevate trunk, assist to BLE to reduce pain with transfer    Transfers Overall transfer level: Needs assistance Equipment used: Rolling walker (2 wheels) Transfers: Sit to/from Stand Sit to Stand: Mod assist, +2 physical assistance, From elevated surface           General transfer comment: The pt maintains RLE NWB due to pain, dependent on significant assist on L side to power up to standing, steady with minA once in static standing position with BUE support, unable to advance wt shift and stepping for transfers      Balance Overall balance assessment: Needs assistance Sitting-balance support: Bilateral upper extremity supported Sitting balance-Leahy Scale: Poor Sitting balance - Comments: prefers BUE support, no overt LOB   Standing balance support: Bilateral upper extremity supported Standing balance-Leahy Scale: Poor Standing balance comment: dependent on BUE support and minA, posterior lean with initial stand                           ADL either performed or assessed with clinical judgement   ADL Overall ADL's : Needs assistance/impaired Eating/Feeding: Set up;Sitting   Grooming: Set up;Sitting   Upper Body Bathing: Contact guard assist;Sitting   Lower Body Bathing: Maximal assistance;Total assistance;Sit to/from stand;Sitting/lateral leans   Upper Body Dressing : Contact guard assist;Sitting   Lower Body Dressing: Maximal assistance;Total assistance;Sit to/from stand;Sitting/lateral leans  Toilet Transfer: Maximal assistance;+2 for physical assistance;+2 for safety/equipment;Stand-pivot;BSC/3in1;Rolling walker (2 wheels) Toilet Transfer Details (indicate cue type and reason): simulated Toileting- Clothing Manipulation and Hygiene: Moderate  assistance;Sitting/lateral lean;Sit to/from stand Toileting - Clothing Manipulation Details (indicate cue type and reason): for thoroughness     Functional mobility during ADLs: Maximal assistance;+2 for physical assistance;+2 for safety/equipment;Cueing for safety;Cueing for sequencing;Rolling walker (2 wheels) General ADL Comments: Prior to this admission, patient living at home under hospice care, with wife and daughter able to provide assist. Patient was ambulatory with a rollator and able to complete ADLs and IADLs with assist from family. Currently, patient is max A of 2 for bed mobility and transfers, and need for increased assist for lower body management. OT is recommending return home with hospice care, with hospital bed already delivered per wife report. Patient has no further acute OT needs, and will sign off at this time. Please re-consult if further acute needs arise.     Vision Baseline Vision/History: 1 Wears glasses Ability to See in Adequate Light: 0 Adequate Patient Visual Report: No change from baseline Vision Assessment?: No apparent visual deficits     Perception Perception: Not tested       Praxis Praxis: Not tested       Pertinent Vitals/Pain Pain Assessment Pain Assessment: 0-10 Pain Score: 10-Worst pain ever Pain Location: in R hip with movement Pain Descriptors / Indicators: Discomfort, Grimacing, Guarding, Aching Pain Intervention(s): Limited activity within patient's tolerance, Monitored during session, Repositioned     Extremity/Trunk Assessment Upper Extremity Assessment Upper Extremity Assessment: RUE deficits/detail RUE Deficits / Details: decreased shoulder flexion at baseline RUE: Shoulder pain with ROM RUE Sensation: WNL RUE Coordination: decreased gross motor   Lower Extremity Assessment Lower Extremity Assessment: Defer to PT evaluation RLE Deficits / Details: grossly 3+/5 to MMT, reports sensation intact. most limited at hip, good  resistance at ankle and knee RLE: Unable to fully assess due to pain RLE Sensation: WNL RLE Coordination: WNL LLE Deficits / Details: grossly 4-/5 to MMT, reports sensation intact, limited by pain LLE: Unable to fully assess due to pain LLE Sensation: WNL LLE Coordination: WNL   Cervical / Trunk Assessment Cervical / Trunk Assessment: Kyphotic   Communication Communication Communication: Impaired Factors Affecting Communication: Reduced clarity of speech   Cognition Arousal: Alert Behavior During Therapy: WFL for tasks assessed/performed Cognition: No apparent impairments                               Following commands: Impaired Following commands impaired: Follows one step commands inconsistently     Cueing  General Comments   Cueing Techniques: Verbal cues  VSS on RA, wife present, hospital bed already delivered via hospice care   Exercises     Shoulder Instructions      Home Living Family/patient expects to be discharged to:: Private residence Living Arrangements: Spouse/significant other Available Help at Discharge: Family;Available PRN/intermittently Type of Home: House Home Access: Stairs to enter [employer] of Steps: 1   Home Layout: One level     Bathroom Shower/Tub: Producer, television/film/video: Handicapped height Bathroom Accessibility: Yes   Home Equipment: Agricultural consultant (2 wheels);Rollator (4 wheels);BSC/3in1;Shower seat;Grab bars - toilet;Grab bars - tub/shower;Wheelchair - Careers adviser (comment);Hand held shower head   Additional Comments: lives with wife, on hospice care with RN 1x/week      Prior Functioning/Environment Prior Level of Function : Needs assist  Mobility Comments: ambulates with rollator, x1 recent fall in bathroom ADLs Comments: wife occasionally assists with LB dressing    OT Problem List: Decreased strength;Decreased range of motion;Decreased activity tolerance;Impaired  balance (sitting and/or standing);Decreased knowledge of use of DME or AE;Decreased knowledge of precautions;Pain   OT Treatment/Interventions:        OT Goals(Current goals can be found in the care plan section)   Acute Rehab OT Goals Patient Stated Goal: to go home OT Goal Formulation: With patient Time For Goal Achievement: 01/13/2024 Potential to Achieve Goals: Fair   OT Frequency:       Co-evaluation   Reason for Co-Treatment: Complexity of the patient's impairments (multi-system involvement);Necessary to address cognition/behavior during functional activity;For patient/therapist safety;To address functional/ADL transfers PT goals addressed during session: Mobility/safety with mobility;Balance;Proper use of DME;Strengthening/ROM        AM-PAC OT 6 Clicks Daily Activity     Outcome Measure Help from another person eating meals?: A Little Help from another person taking care of personal grooming?: A Little Help from another person toileting, which includes using toliet, bedpan, or urinal?: A Lot Help from another person bathing (including washing, rinsing, drying)?: A Lot Help from another person to put on and taking off regular upper body clothing?: A Little Help from another person to put on and taking off regular lower body clothing?: A Lot 6 Click Score: 15   End of Session Equipment Utilized During Treatment: Gait belt;Rolling walker (2 wheels) Nurse Communication: Mobility status  Activity Tolerance: Patient limited by pain Patient left: in bed;with call bell/phone within reach;with bed alarm set;with family/visitor present  OT Visit Diagnosis: Unsteadiness on feet (R26.81);Other abnormalities of gait and mobility (R26.89);Muscle weakness (generalized) (M62.81);History of falling (Z91.81);Pain;Adult, failure to thrive (R62.7) Pain - Right/Left: Right Pain - part of body: Leg;Hip                Time: 8986-8960 OT Time Calculation (min): 26 min Charges:  OT  General Charges $OT Visit: 1 Visit OT Evaluation $OT Eval Moderate Complexity: 1 Mod  Ronal Gift E. Malahki Gasaway, OTR/L Acute Rehabilitation Services [phone]   Ronal Gift Salt 01/02/2024, 11:42 AM

## 2024-01-03 ENCOUNTER — Emergency Department (HOSPITAL_COMMUNITY)
Admission: EM | Admit: 2024-01-03 | Discharge: 2024-01-03 | Disposition: A | Discharge status: Home / Self Care | Attending: Emergency Medicine | Admitting: Emergency Medicine

## 2024-01-03 ENCOUNTER — Emergency Department (HOSPITAL_COMMUNITY)

## 2024-01-03 DIAGNOSIS — R509 Fever, unspecified: Secondary | ICD-10-CM | POA: Insufficient documentation

## 2024-01-03 DIAGNOSIS — K8020 Calculus of gallbladder without cholecystitis without obstruction: Secondary | ICD-10-CM | POA: Diagnosis not present

## 2024-01-03 DIAGNOSIS — R339 Retention of urine, unspecified: Secondary | ICD-10-CM

## 2024-01-03 DIAGNOSIS — S79929A Unspecified injury of unspecified thigh, initial encounter: Secondary | ICD-10-CM | POA: Diagnosis not present

## 2024-01-03 DIAGNOSIS — R1084 Generalized abdominal pain: Secondary | ICD-10-CM | POA: Insufficient documentation

## 2024-01-03 DIAGNOSIS — R350 Frequency of micturition: Secondary | ICD-10-CM | POA: Diagnosis not present

## 2024-01-03 DIAGNOSIS — I7121 Aneurysm of the ascending aorta, without rupture: Secondary | ICD-10-CM | POA: Diagnosis not present

## 2024-01-03 DIAGNOSIS — R059 Cough, unspecified: Secondary | ICD-10-CM | POA: Diagnosis not present

## 2024-01-03 DIAGNOSIS — R0902 Hypoxemia: Secondary | ICD-10-CM | POA: Diagnosis not present

## 2024-01-03 DIAGNOSIS — R5383 Other fatigue: Secondary | ICD-10-CM | POA: Diagnosis not present

## 2024-01-03 DIAGNOSIS — R0602 Shortness of breath: Secondary | ICD-10-CM | POA: Diagnosis not present

## 2024-01-03 DIAGNOSIS — Z7401 Bed confinement status: Secondary | ICD-10-CM | POA: Diagnosis not present

## 2024-01-03 DIAGNOSIS — N3001 Acute cystitis with hematuria: Secondary | ICD-10-CM | POA: Diagnosis not present

## 2024-01-03 DIAGNOSIS — R079 Chest pain, unspecified: Secondary | ICD-10-CM | POA: Diagnosis not present

## 2024-01-03 DIAGNOSIS — R531 Weakness: Secondary | ICD-10-CM | POA: Diagnosis not present

## 2024-01-03 DIAGNOSIS — N3090 Cystitis, unspecified without hematuria: Secondary | ICD-10-CM

## 2024-01-03 LAB — TROPONIN I (HIGH SENSITIVITY)
Troponin I (High Sensitivity): 11 ng/L (ref <18)
Troponin I (High Sensitivity): 11 ng/L (ref <18)

## 2024-01-03 LAB — RESP PANEL BY RT-PCR (RSV, FLU A&B, COVID)  RVPGX2
Influenza A by PCR: NEGATIVE
Influenza B by PCR: NEGATIVE
Resp Syncytial Virus by PCR: NEGATIVE
SARS Coronavirus 2 by RT PCR: NEGATIVE

## 2024-01-03 LAB — CBC WITH DIFFERENTIAL/PLATELET
Abs Immature Granulocytes: 0.06 K/uL (ref 0.00–0.07)
Basophils Absolute: 0 K/uL (ref 0.0–0.1)
Basophils Relative: 0 %
Eosinophils Absolute: 0.1 K/uL (ref 0.0–0.5)
Eosinophils Relative: 1 %
HCT: 33.1 % — ABNORMAL LOW (ref 39.0–52.0)
Hemoglobin: 11 g/dL — ABNORMAL LOW (ref 13.0–17.0)
Immature Granulocytes: 1 %
Lymphocytes Relative: 9 %
Lymphs Abs: 1.1 K/uL (ref 0.7–4.0)
MCH: 31.7 pg (ref 26.0–34.0)
MCHC: 33.2 g/dL (ref 30.0–36.0)
MCV: 95.4 fL (ref 80.0–100.0)
Monocytes Absolute: 0.8 K/uL (ref 0.1–1.0)
Monocytes Relative: 7 %
Neutro Abs: 10.5 K/uL — ABNORMAL HIGH (ref 1.7–7.7)
Neutrophils Relative %: 82 %
Platelets: 150 K/uL (ref 150–400)
RBC: 3.47 MIL/uL — ABNORMAL LOW (ref 4.22–5.81)
RDW: 13.9 % (ref 11.5–15.5)
WBC: 12.6 K/uL — ABNORMAL HIGH (ref 4.0–10.5)
nRBC: 0 % (ref 0.0–0.2)

## 2024-01-03 LAB — COMPREHENSIVE METABOLIC PANEL WITH GFR
ALT: 18 U/L (ref 0–44)
AST: 25 U/L (ref 15–41)
Albumin: 3 g/dL — ABNORMAL LOW (ref 3.5–5.0)
Alkaline Phosphatase: 58 U/L (ref 38–126)
Anion gap: 11 (ref 5–15)
BUN: 21 mg/dL (ref 8–23)
CO2: 19 mmol/L — ABNORMAL LOW (ref 22–32)
Calcium: 8.4 mg/dL — ABNORMAL LOW (ref 8.9–10.3)
Chloride: 107 mmol/L (ref 98–111)
Creatinine, Ser: 1.69 mg/dL — ABNORMAL HIGH (ref 0.61–1.24)
GFR, Estimated: 40 mL/min — ABNORMAL LOW (ref >60)
Glucose, Bld: 95 mg/dL (ref 70–99)
Potassium: 4.1 mmol/L (ref 3.5–5.1)
Sodium: 137 mmol/L (ref 135–145)
Total Bilirubin: 2.3 mg/dL — ABNORMAL HIGH (ref 0.0–1.2)
Total Protein: 6 g/dL — ABNORMAL LOW (ref 6.5–8.1)

## 2024-01-03 LAB — URINALYSIS, W/ REFLEX TO CULTURE (INFECTION SUSPECTED)
Bilirubin Urine: NEGATIVE
Glucose, UA: NEGATIVE mg/dL
Ketones, ur: NEGATIVE mg/dL
Nitrite: NEGATIVE
Protein, ur: NEGATIVE mg/dL
RBC / HPF: >50 RBC/hpf (ref 0–5)
Specific Gravity, Urine: 1.016 (ref 1.005–1.030)
pH: 5 (ref 5.0–8.0)

## 2024-01-03 LAB — BRAIN NATRIURETIC PEPTIDE: B Natriuretic Peptide: 83.9 pg/mL (ref 0.0–100.0)

## 2024-01-03 LAB — TSH: TSH: 2.501 u[IU]/mL (ref 0.350–4.500)

## 2024-01-03 LAB — CK: Total CK: 64 U/L (ref 49–397)

## 2024-01-03 LAB — AMMONIA: Ammonia: 14 umol/L (ref 9–35)

## 2024-01-03 LAB — LIPASE, BLOOD: Lipase: 18 U/L (ref 11–51)

## 2024-01-03 LAB — MAGNESIUM: Magnesium: 1.8 mg/dL (ref 1.7–2.4)

## 2024-01-03 LAB — I-STAT CG4 LACTIC ACID, ED: Lactic Acid, Venous: 1 mmol/L (ref 0.5–1.9)

## 2024-01-03 MED ORDER — FENTANYL CITRATE (PF) 50 MCG/ML IJ SOSY
50.0000 ug | PREFILLED_SYRINGE | Freq: Once | INTRAMUSCULAR | Status: AC
  Administered 2024-01-03: 50 ug via INTRAVENOUS
  Filled 2024-01-03: qty 1

## 2024-01-03 MED ORDER — IOHEXOL 350 MG/ML SOLN
75.0000 mL | Freq: Once | INTRAVENOUS | Status: AC | PRN
  Administered 2024-01-03: 75 mL via INTRAVENOUS

## 2024-01-03 MED ORDER — CEFTRIAXONE SODIUM 2 G IJ SOLR
2.0000 g | Freq: Once | INTRAMUSCULAR | Status: AC
  Administered 2024-01-03: 2 g via INTRAVENOUS
  Filled 2024-01-03: qty 20

## 2024-01-03 MED ORDER — SODIUM CHLORIDE 0.9 % IV BOLUS
500.0000 mL | Freq: Once | INTRAVENOUS | Status: AC
  Administered 2024-01-03: 500 mL via INTRAVENOUS

## 2024-01-03 MED ORDER — MORPHINE SULFATE (PF) 2 MG/ML IV SOLN
2.0000 mg | Freq: Once | INTRAVENOUS | Status: AC
  Administered 2024-01-03: 2 mg via INTRAVENOUS
  Filled 2024-01-03: qty 1

## 2024-01-03 MED ORDER — CEPHALEXIN 500 MG PO CAPS: 500.0000 mg | ORAL_CAPSULE | Freq: Three times a day (TID) | ORAL | 0 refills | Status: DC

## 2024-01-03 NOTE — ED Provider Notes (Signed)
 [facility] Provider Assume Care Note I assumed care of Steven Guerrero on 01/03/2024 at 3 PM from Dr. Cleotis.   Briefly, Steven Guerrero is a 83 y.o. male who: PMHx: Hypertension, prior stroke, COPD on home 2 L, hospice care P/w abdominal pain, distention, cough, urinary frequency, increased oxygen requirement Is on hospice care, however wanted medical workup  Clinical Course as of 01/03/24 1515  Sun Jan 03, 2024  1513 21F, recent admit/dc for pubic ramus fx, belly pain, distention, cough, urine frequency, 2L -> 6L, fever, white count, hospice but wants w/u, consider ABX if needed, fu scans  [LS]    Clinical Course User Index [LS] Rogelia Jerilynn RAMAN, MD    Plan at the time of handoff: Follow-up scans, consider antibiotics pending remainder of workup   Please refer to the original provider's note for additional information regarding the care of Steven Guerrero.  Reassessment: I personally reassessed the patient: Patient complained of persistent abdominal pain.   Vital Signs:  ED Triage Vitals  Encounter Vitals Group     BP 01/03/24 1148 138/69     Girls Systolic BP Percentile --      Girls Diastolic BP Percentile --      Boys Systolic BP Percentile --      Boys Diastolic BP Percentile --      Pulse Rate 01/03/24 1148 81     Resp 01/03/24 1148 17     Temp 01/03/24 1148 98.5 F (36.9 C)     Temp Source 01/03/24 1412 Rectal     SpO2 01/03/24 1148 93 %     Weight 01/03/24 1149 180 lb 12.4 oz (82 kg)     Height 01/03/24 1149 5' 10 (1.778 m)     Head Circumference --      Peak Flow --      Pain Score 01/03/24 1149 0     Pain Loc --      Pain Education --      Exclude from Growth Chart --      Hemodynamics:  The patient is hemodynamically stable. Mental Status:  The patient is alert  Additional MDM: Labs with slight leukocytosis to 12.6 with left shift, lactic acid WNL.  CT PE today without PE or pneumonia, patient does have  urinary distention on CT of the abdomen and pelvis with mild hydronephrosis.  Creatinine minimally elevated to 1.69 from baseline of 1.3-1.5.  On reevaluation, patient continues to have severe abdominal pain and distention, patient able to void but only small amounts, does feel that pain is likely due to distention of the urinary bladder with mild overflow incontinence. Discussed with patient and wife at bedside whether or not he wants to try In-N-Out catheter versus indwelling Foley catheter, and patient would like to have Foley catheter placed.  Voided UA with leukocyte esterase, bacteria and numerous WBCs and RBCs concerning for UTI, therefore will give ceftriaxone .  On reevaluation after Foley catheter placement patient reports resolution of pain, patient also had downtrend in heart rate, and patient saturating in mid 90s on home 2 L nasal cannula.  Discussed with patient and wife at bedside whether or not they would like admission for treatment for UTI.  Given patient did have leukocytosis and tachycardia, feel that admission is reasonable, however lactic acid WNL, blood pressure reassuring, and patient is a hospice patient.  Patient and wife report that his goals appear more consistent with treating this at home as  he would like to maximize his time out of the hospital, I believe that this is reasonable.  Will discharge on course of Keflex..  Disposition: DISCHARGE: I believe that the patient is safe for discharge home with outpatient follow-up. Patient was informed of all pertinent physical exam, laboratory, and imaging findings including UTI.  Patient's suspected etiology of their symptom presentation was discussed with the patient and all questions were answered. We discussed following up with PCP/hospice provider. I provided thorough ED return precautions. The patient feels safe and comfortable with this plan.   FREDRIK CANDIE Later, MD Emergency Medicine    Later Jerilynn RAMAN, MD 01/07/24 [phone]

## 2024-01-03 NOTE — ED Notes (Signed)
 Blood work and nose swab were collected and sent to the lab. IV pain medication was administered via IV. IV fluid were initiated. EKG was performed.

## 2024-01-03 NOTE — ED Notes (Signed)
 [facility] called

## 2024-01-03 NOTE — Discharge Instructions (Signed)
 Steven Guerrero  Thank you for allowing us  to take care of you today.  You came to the Emergency Department today because [name] was having severe abdominal pain.  Here in the emergency department we did labs and he does have a urinary tract infection.  We gave him a dose of antibiotics here in the emergency department, he is having some elevation of his heart rate, and white blood cell count elevation that is likely secondary to this urinary tract infection.  It would be reasonable to bring him in to the hospital, however given he is on hospice, if you strongly prefer to manage him at home, it is also reasonable for him to go home and try antibiotics by mouth.  He also had urinary retention where he was unable to empty his bladder, therefore we put in a Foley catheter, which relieved his abdominal pain.  We will discharge you with the Foley catheter in place and a prescription for antibiotics.  If he has worsening pain, the Foley catheter stops draining, if he has worsening fever, shortness of breath, new or different symptoms, or you feel that you are not able to manage him at home, please come back to the emergency department for reevaluation.  To-Do: 1. Please follow-up with your primary doctor within 2 days / as soon as possible.    Please return to the Emergency Department or call 911 if you experience have worsening of your symptoms, or do not get better, chest pain, shortness of breath, severe or significantly worsening pain, high fever, severe confusion, pass out or have any reason to think that you need emergency medical care.   We hope you feel better soon.   Mitzie Later, MD Department of Emergency Medicine [facility]

## 2024-01-03 NOTE — ED Provider Notes (Signed)
 [facility] Provider Note   CSN: [id number] Arrival date & time: 01/03/24  1130     Patient presents with: Hip Pain   Steven Guerrero is a 83 y.o. male.   The history is provided by the patient and medical records. No language interpreter was used.  Fever Max temp prior to arrival:  104 Temp source:  Oral Severity:  Moderate Onset quality:  Gradual Duration:  1 day Timing:  Constant Progression:  Waxing and waning Chronicity:  New Associated symptoms: chest pain, chills and cough   Associated symptoms: no confusion, no congestion, no diarrhea, no dysuria, no headaches, no nausea, no sore throat and no vomiting   Abdominal Pain Pain location:  Generalized Pain quality: aching   Associated symptoms: chest pain, chills, constipation, cough, fatigue, fever and shortness of breath   Associated symptoms: no diarrhea, no dysuria, no nausea, no sore throat and no vomiting        Prior to Admission medications   Medication Sig Start Date End Date Taking? Authorizing Provider  acetaminophen  (TYLENOL ) 500 MG tablet Take 500-1,000 mg by mouth every 6 (six) hours as needed for moderate pain (pain score 4-6).    [provider]  amLODipine  (NORVASC ) 5 MG tablet Take 5 mg by mouth daily. 07/21/22   [provider]  atorvastatin  (LIPITOR) 20 MG tablet Take 20 mg by mouth daily.     [provider]  augmented betamethasone dipropionate (DIPROLENE-AF) 0.05 % ointment Apply 1 Application topically 2 (two) times daily as needed (for psoriasis). 10/13/23   [provider]  citalopram  (CELEXA ) 20 MG tablet Take 20 mg by mouth at bedtime.    [provider]  Evolocumab (REPATHA) 140 MG/ML SOSY Inject 140 mg into the skin every 14 (fourteen) days.    [provider]  Ferrous Gluconate-C-Folic Acid (IRON-C PO) Take 1 tablet by mouth every other day.    [provider]  fish oil-omega-3 fatty  acids 1000 MG capsule Take 1 g by mouth daily.    [provider]  guaiFENesin  (ROBITUSSIN) 100 MG/5ML liquid Take 15 mLs by mouth every 6 (six) hours. Patient taking differently: Take 15 mLs by mouth daily as needed for to loosen phlegm. 06/26/23   Ghimire, Donalda HERO, MD  ipratropium-albuterol  (DUONEB) 0.5-2.5 (3) MG/3ML SOLN Take 3 mLs by nebulization every 6 (six) hours. Patient taking differently: Take 3 mLs by nebulization every 6 (six) hours as needed (SOB/Wheezing). 06/03/23   Kassie Acquanetta Bradley, MD  losartan  (COZAAR ) 100 MG tablet Take 100 mg by mouth daily. 09/12/23   [provider]  mirtazapine (REMERON) 15 MG tablet Take 7.5 mg by mouth at bedtime. 11/30/20   [provider]  Multiple Vitamin (MULTIVITAMIN WITH MINERALS) TABS tablet Take 1 tablet by mouth daily.    [provider]  oxyCODONE  (OXY IR/ROXICODONE ) 5 MG immediate release tablet Take 0.5-1 tablets (2.5-5 mg total) by mouth every 4 (four) hours as needed for up to 7 days for moderate pain (pain score 4-6) or severe pain (pain score 7-10). 01/02/24 01/09/24  Laurence Locus, DO  pantoprazole  (PROTONIX ) 40 MG tablet Take 40 mg by mouth daily.  02/15/16   [provider]  tamsulosin  (FLOMAX ) 0.4 MG CAPS capsule Take 1 capsule by mouth at bedtime. 08/01/22   [provider]  vitamin B-12 (CYANOCOBALAMIN ) 1000 MCG tablet Take 1,000 mcg by mouth daily.    [provider]  XARELTO  10 MG TABS  tablet Take 10 mg by mouth daily.    [provider]    Allergies: Atenolol, Gabapentin , Lotensin [benazepril hcl], and Wellbutrin [bupropion]    Review of Systems  Constitutional:  Positive for chills, fatigue and fever.  HENT:  Negative for congestion and sore throat.   Eyes:  Negative for visual disturbance.  Respiratory:  Positive for cough and shortness of breath. Negative for chest tightness and wheezing.   Cardiovascular:  Positive for chest pain and leg swelling (chronic).  Negative for palpitations.  Gastrointestinal:  Positive for abdominal distention, abdominal pain and constipation. Negative for diarrhea, nausea and vomiting.  Genitourinary:  Positive for frequency. Negative for dysuria.  Musculoskeletal:  Negative for back pain.  Neurological:  Negative for light-headedness and headaches.  Psychiatric/Behavioral:  Negative for agitation and confusion.   All other systems reviewed and are negative.   Updated Vital Signs BP 138/69   Pulse 81   Temp 98.5 F (36.9 C)   Resp 17   Ht 5' 10 (1.778 m)   Wt 82 kg   SpO2 93%   BMI 25.94 kg/m   Physical Exam Vitals and nursing note reviewed.  Constitutional:      General: He is not in acute distress.    Appearance: He is well-developed. He is not ill-appearing, toxic-appearing or diaphoretic.  HENT:     Head: Normocephalic and atraumatic.     Nose: No congestion or rhinorrhea.     Mouth/Throat:     Mouth: Mucous membranes are dry.     Pharynx: No oropharyngeal exudate or posterior oropharyngeal erythema.  Eyes:     Extraocular Movements: Extraocular movements intact.     Conjunctiva/sclera: Conjunctivae normal.     Pupils: Pupils are equal, round, and reactive to light.  Cardiovascular:     Rate and Rhythm: Normal rate and regular rhythm.     Heart sounds: No murmur heard. Pulmonary:     Effort: Pulmonary effort is normal. No respiratory distress.     Breath sounds: Rhonchi present. No wheezing or rales.  Chest:     Chest wall: No tenderness.  Abdominal:     General: There is distension.     Palpations: Abdomen is soft.     Tenderness: There is abdominal tenderness. There is no guarding or rebound.  Musculoskeletal:        General: Tenderness present. No swelling.     Cervical back: Neck supple.     Right lower leg: Edema present.     Left lower leg: Edema present.  Skin:    General: Skin is warm and dry.     Capillary Refill: Capillary refill takes less than 2 seconds.     Findings:  No erythema or rash.  Neurological:     General: No focal deficit present.     Mental Status: He is alert.     Sensory: No sensory deficit.     Motor: No weakness.  Psychiatric:        Mood and Affect: Mood normal.     (all labs ordered are listed, but only abnormal results are displayed) Labs Reviewed  CBC WITH DIFFERENTIAL/PLATELET - Abnormal; Notable for the following components:      Result Value   WBC 12.6 (*)    RBC 3.47 (*)    Hemoglobin 11.0 (*)    HCT 33.1 (*)    Neutro Abs 10.5 (*)    All other components within normal limits  COMPREHENSIVE METABOLIC PANEL WITH GFR - Abnormal; Notable  for the following components:   CO2 19 (*)    Creatinine, Ser 1.69 (*)    Calcium  8.4 (*)    Total Protein 6.0 (*)    Albumin 3.0 (*)    Total Bilirubin 2.3 (*)    GFR, Estimated 40 (*)    All other components within normal limits  RESP PANEL BY RT-PCR (RSV, FLU A&B, COVID)  RVPGX2  LIPASE, BLOOD  TSH  MAGNESIUM   CK  BRAIN NATRIURETIC PEPTIDE  AMMONIA  URINALYSIS, W/ REFLEX TO CULTURE (INFECTION SUSPECTED)  I-STAT CG4 LACTIC ACID, ED  I-STAT CG4 LACTIC ACID, ED  TROPONIN I (HIGH SENSITIVITY)  TROPONIN I (HIGH SENSITIVITY)    EKG: EKG Interpretation Date/Time:  Sunday January 03 2024 13:38:00 EDT Ventricular Rate:  85 PR Interval:  169 QRS Duration:  122 QT Interval:  400 QTC Calculation: 476 R Axis:   44  Text Interpretation: Sinus rhythm IVCD, consider atypical RBBB when compared to prior, similar appearance No STEMI Confirmed by Ginger Barefoot ([id number]) on 01/03/2024 1:41:39 PM  Radiology: CT Angio Chest PE W and/or Wo Contrast Result Date: 01/03/2024 CLINICAL DATA:  Pulmonary embolism (PE) suspected, high prob History of PE, hypoxia, chest pain, shortness of breath, abdominal pain, distention, decreased appetite, decreased flatus, recent pelvis fracture. EXAM: CT ANGIOGRAPHY CHEST CT ABDOMEN AND PELVIS WITH CONTRAST TECHNIQUE: Multidetector CT imaging of the chest  was performed using the standard protocol during bolus administration of intravenous contrast. Multiplanar CT image reconstructions and MIPs were obtained to evaluate the vascular anatomy. Multidetector CT imaging of the abdomen and pelvis was performed using the standard protocol during bolus administration of intravenous contrast. RADIATION DOSE REDUCTION: This exam was performed according to the departmental dose-optimization program which includes automated exposure control, adjustment of the mA and/or kV according to patient size and/or use of iterative reconstruction technique. CONTRAST:  75mL OMNIPAQUE  IOHEXOL  350 MG/ML SOLN COMPARISON:  CT scan chest, abdomen and pelvis from 10/22/2023. FINDINGS: CTA CHEST FINDINGS Cardiovascular: No evidence of embolism to the proximal subsegmental pulmonary artery level. Mild cardiomegaly. No pericardial effusion. Aneurysmal dilation of the ascending aorta measuring upto 4.3 cm in diameter, on this non cardiac gated exam. There are coronary artery calcifications, in keeping with coronary artery disease. There are also mild-to-moderate peripheral atherosclerotic vascular calcifications of thoracic aorta and its major branches. Mediastinum/Nodes: Visualized thyroid  gland appears grossly unremarkable. No solid / cystic mediastinal masses. The esophagus is nondistended precluding optimal assessment. No axillary, mediastinal or hilar lymphadenopathy by size criteria. Lungs/Pleura: The central tracheo-bronchial tree is patent. There is mild, smooth, circumferential thickening of the segmental and subsegmental bronchial walls, throughout bilateral lungs, which is nonspecific. Findings are most commonly seen with bronchitis or reactive airway disease, such as asthma. Mild-to-moderate upper lobe predominant centrilobular emphysema noted. There are dependent changes in bilateral lungs. There are patchy areas of smooth pleural calcifications throughout bilateral hemithorax,  nonspecific but commonly seen as asbestos related disease. No mass or consolidation. No pleural effusion or pneumothorax. No suspicious lung nodules. Musculoskeletal: The visualized soft tissues of the chest wall are grossly unremarkable. No suspicious osseous lesions. There are mild multilevel degenerative changes in the visualized spine. Review of the MIP images confirms the above findings. CT ABDOMEN and PELVIS FINDINGS Hepatobiliary: The liver is normal in size. Non-cirrhotic configuration. No suspicious mass. No intrahepatic or extrahepatic bile duct dilation. There is very small volume dependent hyperattenuating focus in the distal common bile duct (series 12, images 24-25), which may represent small volume dependent tiny choledocholithiasis.  The gallbladder is distended and also contains small volume dependent tiny calcified gallstones without imaging signs of acute cholecystitis. Pancreas: Unremarkable. No pancreatic ductal dilatation or surrounding inflammatory changes. Spleen: Normal in size. There are subcentimeter calcified granulomas in the spleen. No other focal lesion. Adrenals/Urinary Tract: Adrenal glands are unremarkable. Bilateral kidneys exhibit mild diffuse cortical atrophy. There are several simple cysts in bilateral kidneys with largest arising from the left kidney upper pole, medially measuring up to 2.3 x 2.8 cm. No nephroureterolithiasis. There is bilateral mild hydronephrosis and hydroureter which is likely secondary to distended urinary bladder. No suspicious obstructing mass or ureterolithiasis seen. Markedly distended urinary bladder otherwise unremarkable. Stomach/Bowel: No disproportionate dilation of the small or large bowel loops. No evidence of abnormal bowel wall thickening or inflammatory changes. The appendix is unremarkable. There are multiple diverticula mainly in the left hemi colon, without imaging signs of diverticulitis. Vascular/Lymphatic: No ascites or pneumoperitoneum.  No abdominal or pelvic lymphadenopathy, by size criteria. No aneurysmal dilation of the major abdominal arteries. There are moderate peripheral atherosclerotic vascular calcifications of the aorta and its major branches. Reproductive: Normal size prostate. Symmetric seminal vesicles. Other: There are fat containing umbilical and bilateral inguinal hernias. The soft tissues and abdominal wall are otherwise unremarkable. Musculoskeletal: No suspicious osseous lesions. There are mild - moderate multilevel degenerative changes in the visualized spine. Chronic moderate anterior wedging deformity of L2 vertebra noted, unchanged since the prior study from 10/22/2023. No significant retropulsion or spinal canal compromise. Right hip arthroplasty noted. There is asymmetric diffuse atrophy of right-sided pelvic musculature, likely due to disuse atrophy. Review of the MIP images confirms the above findings. IMPRESSION: 1. No embolism to the proximal subsegmental pulmonary artery level. 2. No acute inflammatory process identified within the abdomen or pelvis. No bowel obstruction. 3. There is very small volume dependent hyperattenuating focus in the distal common bile duct, which may represent small volume tiny choledocholithiasis. There is distended gallbladder containing small volume dependent calcified gallstones without imaging signs of acute cholecystitis. 4. There is bilateral mild hydronephrosis and hydroureter which is likely secondary to markedly distended urinary bladder. No suspicious obstructing mass or ureterolithiasis seen. 5. Multiple other nonacute observations, as described above. Aortic aneurysm NOS (ICD10-I71.9). Aortic Atherosclerosis (ICD10-I70.0) and Emphysema (ICD10-J43.9). Electronically Signed   By: Ree Molt M.D.   On: 01/03/2024 15:15   CT ABDOMEN PELVIS W CONTRAST Result Date: 01/03/2024 CLINICAL DATA:  Pulmonary embolism (PE) suspected, high prob History of PE, hypoxia, chest pain,  shortness of breath, abdominal pain, distention, decreased appetite, decreased flatus, recent pelvis fracture. EXAM: CT ANGIOGRAPHY CHEST CT ABDOMEN AND PELVIS WITH CONTRAST TECHNIQUE: Multidetector CT imaging of the chest was performed using the standard protocol during bolus administration of intravenous contrast. Multiplanar CT image reconstructions and MIPs were obtained to evaluate the vascular anatomy. Multidetector CT imaging of the abdomen and pelvis was performed using the standard protocol during bolus administration of intravenous contrast. RADIATION DOSE REDUCTION: This exam was performed according to the departmental dose-optimization program which includes automated exposure control, adjustment of the mA and/or kV according to patient size and/or use of iterative reconstruction technique. CONTRAST:  75mL OMNIPAQUE  IOHEXOL  350 MG/ML SOLN COMPARISON:  CT scan chest, abdomen and pelvis from 10/22/2023. FINDINGS: CTA CHEST FINDINGS Cardiovascular: No evidence of embolism to the proximal subsegmental pulmonary artery level. Mild cardiomegaly. No pericardial effusion. Aneurysmal dilation of the ascending aorta measuring upto 4.3 cm in diameter, on this non cardiac gated exam. There are coronary artery calcifications, in keeping  with coronary artery disease. There are also mild-to-moderate peripheral atherosclerotic vascular calcifications of thoracic aorta and its major branches. Mediastinum/Nodes: Visualized thyroid  gland appears grossly unremarkable. No solid / cystic mediastinal masses. The esophagus is nondistended precluding optimal assessment. No axillary, mediastinal or hilar lymphadenopathy by size criteria. Lungs/Pleura: The central tracheo-bronchial tree is patent. There is mild, smooth, circumferential thickening of the segmental and subsegmental bronchial walls, throughout bilateral lungs, which is nonspecific. Findings are most commonly seen with bronchitis or reactive airway disease, such as  asthma. Mild-to-moderate upper lobe predominant centrilobular emphysema noted. There are dependent changes in bilateral lungs. There are patchy areas of smooth pleural calcifications throughout bilateral hemithorax, nonspecific but commonly seen as asbestos related disease. No mass or consolidation. No pleural effusion or pneumothorax. No suspicious lung nodules. Musculoskeletal: The visualized soft tissues of the chest wall are grossly unremarkable. No suspicious osseous lesions. There are mild multilevel degenerative changes in the visualized spine. Review of the MIP images confirms the above findings. CT ABDOMEN and PELVIS FINDINGS Hepatobiliary: The liver is normal in size. Non-cirrhotic configuration. No suspicious mass. No intrahepatic or extrahepatic bile duct dilation. There is very small volume dependent hyperattenuating focus in the distal common bile duct (series 12, images 24-25), which may represent small volume dependent tiny choledocholithiasis. The gallbladder is distended and also contains small volume dependent tiny calcified gallstones without imaging signs of acute cholecystitis. Pancreas: Unremarkable. No pancreatic ductal dilatation or surrounding inflammatory changes. Spleen: Normal in size. There are subcentimeter calcified granulomas in the spleen. No other focal lesion. Adrenals/Urinary Tract: Adrenal glands are unremarkable. Bilateral kidneys exhibit mild diffuse cortical atrophy. There are several simple cysts in bilateral kidneys with largest arising from the left kidney upper pole, medially measuring up to 2.3 x 2.8 cm. No nephroureterolithiasis. There is bilateral mild hydronephrosis and hydroureter which is likely secondary to distended urinary bladder. No suspicious obstructing mass or ureterolithiasis seen. Markedly distended urinary bladder otherwise unremarkable. Stomach/Bowel: No disproportionate dilation of the small or large bowel loops. No evidence of abnormal bowel wall  thickening or inflammatory changes. The appendix is unremarkable. There are multiple diverticula mainly in the left hemi colon, without imaging signs of diverticulitis. Vascular/Lymphatic: No ascites or pneumoperitoneum. No abdominal or pelvic lymphadenopathy, by size criteria. No aneurysmal dilation of the major abdominal arteries. There are moderate peripheral atherosclerotic vascular calcifications of the aorta and its major branches. Reproductive: Normal size prostate. Symmetric seminal vesicles. Other: There are fat containing umbilical and bilateral inguinal hernias. The soft tissues and abdominal wall are otherwise unremarkable. Musculoskeletal: No suspicious osseous lesions. There are mild - moderate multilevel degenerative changes in the visualized spine. Chronic moderate anterior wedging deformity of L2 vertebra noted, unchanged since the prior study from 10/22/2023. No significant retropulsion or spinal canal compromise. Right hip arthroplasty noted. There is asymmetric diffuse atrophy of right-sided pelvic musculature, likely due to disuse atrophy. Review of the MIP images confirms the above findings. IMPRESSION: 1. No embolism to the proximal subsegmental pulmonary artery level. 2. No acute inflammatory process identified within the abdomen or pelvis. No bowel obstruction. 3. There is very small volume dependent hyperattenuating focus in the distal common bile duct, which may represent small volume tiny choledocholithiasis. There is distended gallbladder containing small volume dependent calcified gallstones without imaging signs of acute cholecystitis. 4. There is bilateral mild hydronephrosis and hydroureter which is likely secondary to markedly distended urinary bladder. No suspicious obstructing mass or ureterolithiasis seen. 5. Multiple other nonacute observations, as described above. Aortic aneurysm NOS (  ICD10-I71.9). Aortic Atherosclerosis (ICD10-I70.0) and Emphysema (ICD10-J43.9). Electronically  Signed   By: Ree Molt M.D.   On: 01/03/2024 15:15   CT Hip Right Wo Contrast Result Date: 01/01/2024 CLINICAL DATA:  Fall, right hip pain EXAM: CT OF THE RIGHT HIP WITHOUT CONTRAST TECHNIQUE: Multidetector CT imaging of the right hip was performed according to the standard protocol. Multiplanar CT image reconstructions were also generated. RADIATION DOSE REDUCTION: This exam was performed according to the departmental dose-optimization program which includes automated exposure control, adjustment of the mA and/or kV according to patient size and/or use of iterative reconstruction technique. COMPARISON:  Plain films today. FINDINGS: Bones/Joint/Cartilage Changes of prior right hip replacement. Cerclage wires noted. There is an acute appearing fracture in the right inferior pubic ramus. No additional fracture seen. No subluxation or dislocation. Ligaments Suboptimally assessed by CT. Muscles and Tendons Negative Soft tissues Negative IMPRESSION: Right inferior pubic ramus fracture. Prior right hip replacement. Electronically Signed   By: Franky Crease M.D.   On: 01/01/2024 23:30   DG Hip Unilat W or Wo Pelvis 2-3 Views Right Result Date: 01/01/2024 CLINICAL DATA:  Pain after fall onto the right hip. EXAM: DG HIP (WITH OR WITHOUT PELVIS) 2-3V RIGHT COMPARISON:  10/22/2023 FINDINGS: Postoperative changes with right total hip arthroplasty using long-stem femoral component and multiple cerclage wires. The inferior aspect of the component is not included within the field of view. If the patient has right hip pain, additional images of the femur are recommended to complete the evaluation. As visualized, there is no evidence of acute fracture or dislocation of the right hip. Diffuse bone deformity consistent with callus formation. Old ununited ossicles. Deformity of the right inferior pubic ramus appears new since prior study and could indicate acute fracture. Pelvis appears otherwise intact. SI joints and  symphysis pubis are not displaced. Degenerative changes in the lower lumbar spine and left hip. Vascular calcifications. IMPRESSION: 1. Postoperative right total hip arthroplasty with cerclage wires and associated callus formation and ununited ossicles consistent with old fracture healing. No acute fracture is demonstrated although the inferior aspect of the femoral component is not included in additional views of the right femur are recommended for completion of the examination. 2. Irregularity in the right inferior pubic ramus is new since previous study and could indicate acute fracture. 3. Degenerative changes in the lower lumbar spine and left hip. Electronically Signed   By: Elsie Gravely M.D.   On: 01/01/2024 22:59   CT Head Wo Contrast Result Date: 01/01/2024 CLINICAL DATA:  Recent fall with headaches and neck pain, initial encounter EXAM: CT HEAD WITHOUT CONTRAST CT CERVICAL SPINE WITHOUT CONTRAST TECHNIQUE: Multidetector CT imaging of the head and cervical spine was performed following the standard protocol without intravenous contrast. Multiplanar CT image reconstructions of the cervical spine were also generated. RADIATION DOSE REDUCTION: This exam was performed according to the departmental dose-optimization program which includes automated exposure control, adjustment of the mA and/or kV according to patient size and/or use of iterative reconstruction technique. COMPARISON:  04/30/2023 FINDINGS: CT HEAD FINDINGS Brain: No evidence of acute infarction, hemorrhage, hydrocephalus, extra-axial collection or mass lesion/mass effect. Chronic atrophic and ischemic changes are again seen and stable. Vascular: No hyperdense vessel or unexpected calcification. Skull: Normal. Negative for fracture or focal lesion. Sinuses/Orbits: No acute finding. Other: None. CT CERVICAL SPINE FINDINGS Alignment: Within normal limits. Skull base and vertebrae: 7 cervical segments are well visualized. Vertebral body height  is well maintained. Mild osteophytic changes and facet hypertrophic  changes are noted. No acute fracture or acute facet abnormality is noted. The odontoid is within normal limits. Soft tissues and spinal canal: Surrounding soft tissue structures are within normal limits. Upper chest: Visualized lung apices show no acute abnormality. Other: None IMPRESSION: CT of the head: No acute intracranial abnormality noted. Chronic atrophic and ischemic changes are noted. CT of the cervical spine: Multilevel degenerative change without acute abnormality. Electronically Signed   By: Oneil Devonshire M.D.   On: 01/01/2024 22:59   CT Cervical Spine Wo Contrast Result Date: 01/01/2024 CLINICAL DATA:  Recent fall with headaches and neck pain, initial encounter EXAM: CT HEAD WITHOUT CONTRAST CT CERVICAL SPINE WITHOUT CONTRAST TECHNIQUE: Multidetector CT imaging of the head and cervical spine was performed following the standard protocol without intravenous contrast. Multiplanar CT image reconstructions of the cervical spine were also generated. RADIATION DOSE REDUCTION: This exam was performed according to the departmental dose-optimization program which includes automated exposure control, adjustment of the mA and/or kV according to patient size and/or use of iterative reconstruction technique. COMPARISON:  04/30/2023 FINDINGS: CT HEAD FINDINGS Brain: No evidence of acute infarction, hemorrhage, hydrocephalus, extra-axial collection or mass lesion/mass effect. Chronic atrophic and ischemic changes are again seen and stable. Vascular: No hyperdense vessel or unexpected calcification. Skull: Normal. Negative for fracture or focal lesion. Sinuses/Orbits: No acute finding. Other: None. CT CERVICAL SPINE FINDINGS Alignment: Within normal limits. Skull base and vertebrae: 7 cervical segments are well visualized. Vertebral body height is well maintained. Mild osteophytic changes and facet hypertrophic changes are noted. No acute fracture  or acute facet abnormality is noted. The odontoid is within normal limits. Soft tissues and spinal canal: Surrounding soft tissue structures are within normal limits. Upper chest: Visualized lung apices show no acute abnormality. Other: None IMPRESSION: CT of the head: No acute intracranial abnormality noted. Chronic atrophic and ischemic changes are noted. CT of the cervical spine: Multilevel degenerative change without acute abnormality. Electronically Signed   By: Oneil Devonshire M.D.   On: 01/01/2024 22:59     Procedures   Medications Ordered in the ED - No data to display  Clinical Course as of 01/03/24 1532  Sun Jan 03, 2024  1513 10F, recent admit/dc for pubic ramus fx, belly pain, distention, cough, urine frequency, 2L -> 6L, fever, white count, hospice but wants w/u, consider ABX if needed, fu scans  [LS]    Clinical Course User Index [LS] Rogelia Jerilynn RAMAN, MD                                 Medical Decision Making Amount and/or Complexity of Data Reviewed Labs: ordered. Radiology: ordered.  Risk Prescription drug management.    Steven Guerrero is a 83 y.o. male with a past medical history significant for hypertension, COPD on 2 L home oxygen, previous pulmonary embolism on Eliquis therapy, recent admission for fall and right inferior pubic ramus fracture, previous intracerebral hemorrhage, previous stroke, TIA, hypercholesterolemia, and MGUS who presents with fever of 104, abdominal distention, abdominal pain, decreased appetite, decreased flatus, urinary frequency, intermittent chest pain, shortness of breath, cough, and increased oxygen requirement.  According to patient and family, he recent left hospital after he was admitted for a fall with right inferior pubic ramus fracture.  Patient is on hospice.  According to family, today the hospice nurse saw the patient and was concerned that he had a temperature of 104, was not eating  and drinking, and was having worsening pain.   Patient has abdominal pain bloating and distention and has had decreased oral intake.  He reportedly has not had a bowel movement several days and is passing less gas.  No reported history of bowel obstruction.  He also has had some cough and was feeling some intermittent chest discomfort and some shortness of breath.  His oxygen saturations were in the 80s on his home 2 L and he is now on 6 L.  Patient otherwise has not any headache or neck pain.  He is reporting no chest pain at this time.  He has had urinary frequency but denies dysuria.  Family is concerned that he could be septic given his fever symptoms.  On exam, lungs have some coarseness.  Chest is nontender.  No murmur for me.  Abdomen is diffusely tender but I did hear some bowel sounds.  Flanks are tender.  Right hip is tender.  Patient has intact sensation and strength but he has edema in both legs that they report is more chronic.  No neck tenderness on my exam.  Clinically I am somewhat concerned that patient has infection given his recent fever.  Will get a COVID swab given the ongoing pandemic and recent hospital visit, labs, urinalysis given his urinary symptoms, and we will get both a CT PE study given his history of pulmonary embolism with this intermittent chest pain and shortness of breath and hypoxia and cough as well as a CT on pelvis to look for bowel obstruction or intra-abdominal pathology given his symptoms.  Will give some pain medicine and a small amount of fluids as he does not appear clinically fluid overloaded now.  Anticipate admission after workup is completed.    Care transferred oncoming team to wait for workup results and disposition.  Anticipate admission given increasing oxygen requirement and his appearance on arrival.       Final diagnoses:  Generalized abdominal pain  Fatigue, unspecified type  Fever, unspecified fever cause  Urinary frequency  Shortness of breath  Cough, unspecified type      Clinical Impression: 1. Generalized abdominal pain   2. Fatigue, unspecified type   3. Fever, unspecified fever cause   4. Urinary frequency   5. Shortness of breath   6. Cough, unspecified type     Disposition: Care transferred oncoming team to wait for workup results and disposition.  Anticipate admission given increasing oxygen requirement and his appearance on arrival.  This note was prepared with assistance of Dragon voice recognition software. Occasional wrong-word or sound-a-like substitutions may have occurred due to the inherent limitations of voice recognition software.      Joann Jorge, Lonni PARAS, MD 01/03/24 [phone]

## 2024-01-03 NOTE — ED Triage Notes (Signed)
 Patient presents to the ER via EMS from home with increased right hip pain. Patient had a fall 2 days ago and was seen in this ER with a confirmed right hip fracture. Patient was sent home on hospice. Hospice nurse present when EMS arrived and reported increase pain after 2 doses of oxycodone , and fever of 106F. In ER patient reports no pain. Patient has no fever in ER.

## 2024-01-03 NOTE — Progress Notes (Addendum)
 [name] Liaison Note   [name] is a current patient with [facility] with a terminal diagnosis of  Cerebrovascular Disease. He presented to the ED from home with increased right hip pain. Patient had a fall 2 days ago and was seen in this ER with a confirmed right hip fracture. Hospice nurse reported increase pain after 2 doses of oxycodone  as well as increased shortness of breath.      Should patient need ambulance transfer at discharge- please use [facility]) as they contract this service for our active hospice patients.     Transfer summary and Med list sent to be placed under the Media tab in patient's chart.     Thank you for the opportunity to participate in this patient's care, please don't hesitate to call for any hospice related questions or concerns.    Nat Babe, BSN, [employer]  [phone]

## 2024-01-05 LAB — URINE CULTURE: Culture: >=100000 — AB

## 2024-01-06 ENCOUNTER — Telehealth (HOSPITAL_BASED_OUTPATIENT_CLINIC_OR_DEPARTMENT_OTHER): Payer: Self-pay | Admitting: Emergency Medicine

## 2024-01-06 NOTE — Telephone Encounter (Signed)
 Post ED Visit - Positive Culture Follow-up  Culture report reviewed by antimicrobial stewardship pharmacist: [name] []  Rankin Dee, Pharm.D. []  Venetia Gully, Pharm.D., BCPS AQ-ID []  Garrel Crews, Pharm.D., BCPS []  Almarie Lunger, Pharm.D., BCPS []  [place], [address].D., BCPS, AAHIVP []  Rosaline Bihari, Pharm.D., BCPS, AAHIVP []  Vernell Meier, PharmD, BCPS []  Latanya Hint, PharmD, BCPS []  Donald Medley, PharmD, BCPS [x]  Amon Rocher, PharmD []  Dorothyann Alert, PharmD, BCPS []  Morene Babe, PharmD  [facility] []  Rosaline Edison, PharmD []  Romona Bliss, PharmD []  Dolphus Roller, PharmD []  Veva Seip, Rph []  Vernell Daunt) Leonce, PharmD []  Eva Allis, PharmD []  Rosaline Millet, PharmD []  Iantha Batch, PharmD []  Arvin Gauss, PharmD []  Wanda Hasting, PharmD []  Ronal Rav, PharmD []  Rocky Slade, PharmD []  Bard Jeans, PharmD   Positive urine culture Treated with Cephalexin, organism sensitive to the same and no further patient follow-up is required at this time.  Steven Guerrero 01/06/2024, 4:33 PM

## 2024-01-08 LAB — CULTURE, BLOOD (SINGLE)
Culture: NO GROWTH
Special Requests: ADEQUATE

## 2024-01-13 ENCOUNTER — Ambulatory Visit: Admitting: Family Medicine

## 2024-01-23 DEATH — deceased

## 2024-04-06 ENCOUNTER — Telehealth: Payer: Self-pay | Admitting: Hematology

## 2024-04-06 ENCOUNTER — Other Ambulatory Visit (HOSPITAL_COMMUNITY): Payer: Self-pay

## 2024-04-06 NOTE — Telephone Encounter (Signed)
 Pt spouse called and cancelled appts because pt is now deceased

## 2024-04-13 ENCOUNTER — Inpatient Hospital Stay: Payer: Medicare Other

## 2024-04-27 ENCOUNTER — Inpatient Hospital Stay: Payer: Medicare Other | Admitting: Hematology
